# Patient Record
Sex: Male | Born: 1950
Health system: Southern US, Community
[De-identification: ages and names within clinical notes are randomized; demographics above are authoritative.]

## PROBLEM LIST (undated history)

## (undated) DIAGNOSIS — E871 Hypo-osmolality and hyponatremia: Secondary | ICD-10-CM

## (undated) DIAGNOSIS — I639 Cerebral infarction, unspecified: Secondary | ICD-10-CM

## (undated) DIAGNOSIS — K219 Gastro-esophageal reflux disease without esophagitis: Secondary | ICD-10-CM

## (undated) DIAGNOSIS — M1990 Unspecified osteoarthritis, unspecified site: Secondary | ICD-10-CM

## (undated) DIAGNOSIS — H269 Unspecified cataract: Secondary | ICD-10-CM

## (undated) DIAGNOSIS — I5042 Chronic combined systolic (congestive) and diastolic (congestive) heart failure: Secondary | ICD-10-CM

## (undated) DIAGNOSIS — G629 Polyneuropathy, unspecified: Secondary | ICD-10-CM

## (undated) DIAGNOSIS — J9610 Chronic respiratory failure, unspecified whether with hypoxia or hypercapnia: Secondary | ICD-10-CM

## (undated) DIAGNOSIS — I739 Peripheral vascular disease, unspecified: Secondary | ICD-10-CM

## (undated) DIAGNOSIS — I779 Disorder of arteries and arterioles, unspecified: Secondary | ICD-10-CM

## (undated) DIAGNOSIS — I10 Essential (primary) hypertension: Secondary | ICD-10-CM

## (undated) DIAGNOSIS — N183 Chronic kidney disease, stage 3 unspecified: Secondary | ICD-10-CM

## (undated) DIAGNOSIS — I219 Acute myocardial infarction, unspecified: Secondary | ICD-10-CM

## (undated) DIAGNOSIS — F419 Anxiety disorder, unspecified: Secondary | ICD-10-CM

## (undated) DIAGNOSIS — G459 Transient cerebral ischemic attack, unspecified: Secondary | ICD-10-CM

## (undated) DIAGNOSIS — G4730 Sleep apnea, unspecified: Secondary | ICD-10-CM

## (undated) DIAGNOSIS — Z87891 Personal history of nicotine dependence: Secondary | ICD-10-CM

## (undated) DIAGNOSIS — I2510 Atherosclerotic heart disease of native coronary artery without angina pectoris: Secondary | ICD-10-CM

## (undated) DIAGNOSIS — E119 Type 2 diabetes mellitus without complications: Secondary | ICD-10-CM

## (undated) DIAGNOSIS — J449 Chronic obstructive pulmonary disease, unspecified: Secondary | ICD-10-CM

## (undated) DIAGNOSIS — D649 Anemia, unspecified: Secondary | ICD-10-CM

## (undated) DIAGNOSIS — E785 Hyperlipidemia, unspecified: Secondary | ICD-10-CM

## (undated) DIAGNOSIS — J189 Pneumonia, unspecified organism: Secondary | ICD-10-CM

## (undated) DIAGNOSIS — I480 Paroxysmal atrial fibrillation: Secondary | ICD-10-CM

## (undated) HISTORY — PX: TONSILLECTOMY: SUR1361

## (undated) HISTORY — DX: Hypo-osmolality and hyponatremia: E87.1

## (undated) HISTORY — DX: Disorder of arteries and arterioles, unspecified: I77.9

## (undated) HISTORY — DX: Anemia, unspecified: D64.9

## (undated) HISTORY — DX: Anxiety disorder, unspecified: F41.9

## (undated) HISTORY — DX: Chronic respiratory failure, unspecified whether with hypoxia or hypercapnia: J96.10

## (undated) HISTORY — DX: Chronic combined systolic (congestive) and diastolic (congestive) heart failure: I50.42

## (undated) HISTORY — PX: EYE SURGERY: SHX253

## (undated) HISTORY — DX: Cerebral infarction, unspecified: I63.9

## (undated) HISTORY — DX: Paroxysmal atrial fibrillation: I48.0

## (undated) HISTORY — PX: BACK SURGERY: SHX140

## (undated) HISTORY — DX: Unspecified cataract: H26.9

## (undated) HISTORY — DX: Chronic kidney disease, stage 3 unspecified: N18.30

## (undated) HISTORY — DX: Transient cerebral ischemic attack, unspecified: G45.9

## (undated) HISTORY — DX: Peripheral vascular disease, unspecified: I73.9

## (undated) HISTORY — DX: Chronic kidney disease, stage 3 (moderate): N18.3

---

## 2009-09-10 ENCOUNTER — Encounter (INDEPENDENT_AMBULATORY_CARE_PROVIDER_SITE_OTHER): Payer: Self-pay | Admitting: *Deleted

## 2010-04-23 ENCOUNTER — Telehealth: Payer: Self-pay | Admitting: Gastroenterology

## 2010-09-29 NOTE — Letter (Signed)
Summary: Colonoscopy Letter  [facility]  [address] [place], [place] [address]   Phone: [phone]  Fax: [phone]      September 10, 2009 MRN: [id number]   MELISSA PULIDO [address] [place], [place]  [address]   Dear Mr. ORGERON,   According to your medical record, it is time for you to schedule a Colonoscopy. The American Cancer Society recommends this procedure as a method to detect early colon cancer. Patients with a family history of colon cancer, or a personal history of colon polyps or inflammatory bowel disease are at increased risk.  This letter has beeen generated based on the recommendations made at the time of your procedure. If you feel that in your particular situation this may no longer apply, please contact our office.  Please call our office at [phone] to schedule this appointment or to update your records at your earliest convenience.  Thank you for cooperating with [place] to provide you with the very best care possible.   Sincerely,  Judie Petit T. Russella Dar, M.D.  [facility] [phone]

## 2010-09-29 NOTE — Progress Notes (Signed)
Summary: Schedule Colonoscopy  Phone Note Outgoing Call Call back at [facility] Phone [phone]   Call placed by: Harlow Mares CMA Duncan Dull),  April 23, 2010 2:06 PM Call placed to: Patient Summary of Call: Left message on patients machine to call back. patient is due for a colonoscopy Initial call taken by: Harlow Mares CMA Duncan Dull),  April 23, 2010 2:07 PM  Follow-up for Phone Call        Left a message on the patient machine to call back and schedule a previsit and procedure with our office. A letter will be mailed to the patient.   Follow-up by: Harlow Mares CMA Duncan Dull),  April 30, 2010 2:41 PM

## 2011-11-29 ENCOUNTER — Encounter: Payer: Self-pay | Admitting: Gastroenterology

## 2012-06-29 ENCOUNTER — Other Ambulatory Visit: Payer: Self-pay

## 2012-06-29 DIAGNOSIS — R0602 Shortness of breath: Secondary | ICD-10-CM

## 2012-07-07 ENCOUNTER — Ambulatory Visit (HOSPITAL_COMMUNITY)
Admission: RE | Admit: 2012-07-07 | Discharge: 2012-07-07 | Disposition: A | Discharge status: Home / Self Care | Payer: Non-veteran care | Source: Ambulatory Visit | Attending: Pulmonary Disease | Admitting: Pulmonary Disease

## 2012-07-07 DIAGNOSIS — J4489 Other specified chronic obstructive pulmonary disease: Secondary | ICD-10-CM | POA: Insufficient documentation

## 2012-07-07 DIAGNOSIS — J449 Chronic obstructive pulmonary disease, unspecified: Secondary | ICD-10-CM | POA: Insufficient documentation

## 2012-07-07 MED ORDER — ALBUTEROL SULFATE (5 MG/ML) 0.5% IN NEBU
2.5000 mg | INHALATION_SOLUTION | Freq: Once | RESPIRATORY_TRACT | Status: AC
  Administered 2012-07-07: 2.5 mg via RESPIRATORY_TRACT

## 2012-07-10 NOTE — Procedures (Signed)
NAMEEWAN, GRAU                 ACCOUNT NO.:  [id number]  MEDICAL RECORD NO.:  [id number]  LOCATION:  RESP                          FACILITY:  [facility]  PHYSICIAN:  Abdulrahman Bracey L. Juanetta Gosling, M.D.DATE OF BIRTH:  16-Dec-1950  DATE OF PROCEDURE: DATE OF DISCHARGE:  07/07/2012                           PULMONARY FUNCTION TEST   Reason for pulmonary function testing is COPD. 1. Spirometry shows a moderate ventilatory defect with evidence of     airflow obstruction. 2. Lung volumes show air trapping. 3. DLCO is moderately reduced. 4. Airway resistance is elevated confirming the presence of airflow     obstruction. 5. There is improvement with inhaled bronchodilator, but it does not     reach the level of significance.     Tannisha Kennington L. Juanetta Gosling, M.D.     EL[facility]/MEDQ  D:  07/10/2012  T:  07/10/2012  Job:  [id number]

## 2014-08-27 ENCOUNTER — Inpatient Hospital Stay (HOSPITAL_COMMUNITY)
Admission: AD | Admit: 2014-08-27 | Discharge: 2014-08-29 | DRG: 281 | Disposition: A | Discharge status: Home / Self Care | Payer: Medicare Other | Source: Other Acute Inpatient Hospital | Attending: Cardiology | Admitting: Cardiology

## 2014-08-27 DIAGNOSIS — R079 Chest pain, unspecified: Secondary | ICD-10-CM | POA: Diagnosis present

## 2014-08-27 DIAGNOSIS — E780 Pure hypercholesterolemia: Secondary | ICD-10-CM | POA: Diagnosis present

## 2014-08-27 DIAGNOSIS — J441 Chronic obstructive pulmonary disease with (acute) exacerbation: Secondary | ICD-10-CM | POA: Diagnosis present

## 2014-08-27 DIAGNOSIS — E1140 Type 2 diabetes mellitus with diabetic neuropathy, unspecified: Secondary | ICD-10-CM | POA: Diagnosis present

## 2014-08-27 DIAGNOSIS — E1165 Type 2 diabetes mellitus with hyperglycemia: Secondary | ICD-10-CM | POA: Diagnosis present

## 2014-08-27 DIAGNOSIS — Z6841 Body Mass Index (BMI) 40.0 and over, adult: Secondary | ICD-10-CM

## 2014-08-27 DIAGNOSIS — I214 Non-ST elevation (NSTEMI) myocardial infarction: Secondary | ICD-10-CM | POA: Diagnosis present

## 2014-08-27 DIAGNOSIS — G629 Polyneuropathy, unspecified: Secondary | ICD-10-CM | POA: Diagnosis present

## 2014-08-27 DIAGNOSIS — I2582 Chronic total occlusion of coronary artery: Secondary | ICD-10-CM | POA: Diagnosis present

## 2014-08-27 DIAGNOSIS — I249 Acute ischemic heart disease, unspecified: Secondary | ICD-10-CM | POA: Diagnosis present

## 2014-08-27 DIAGNOSIS — G4733 Obstructive sleep apnea (adult) (pediatric): Secondary | ICD-10-CM | POA: Diagnosis present

## 2014-08-27 DIAGNOSIS — I25118 Atherosclerotic heart disease of native coronary artery with other forms of angina pectoris: Secondary | ICD-10-CM | POA: Diagnosis present

## 2014-08-27 DIAGNOSIS — I10 Essential (primary) hypertension: Secondary | ICD-10-CM | POA: Diagnosis present

## 2014-08-27 DIAGNOSIS — I252 Old myocardial infarction: Secondary | ICD-10-CM | POA: Diagnosis not present

## 2014-08-27 HISTORY — DX: Chronic obstructive pulmonary disease, unspecified: J44.9

## 2014-08-27 HISTORY — DX: Sleep apnea, unspecified: G47.30

## 2014-08-27 HISTORY — DX: Essential (primary) hypertension: I10

## 2014-08-27 HISTORY — DX: Polyneuropathy, unspecified: G62.9

## 2014-08-27 LAB — GLUCOSE, CAPILLARY: Glucose-Capillary: 339 mg/dL — ABNORMAL HIGH (ref 70–99)

## 2014-08-27 LAB — COMPREHENSIVE METABOLIC PANEL
ALT: 35 U/L (ref 0–53)
AST: 42 U/L — ABNORMAL HIGH (ref 0–37)
Albumin: 3.5 g/dL (ref 3.5–5.2)
Alkaline Phosphatase: 44 U/L (ref 39–117)
Anion gap: 14 (ref 5–15)
BUN: 17 mg/dL (ref 6–23)
CO2: 28 mmol/L (ref 19–32)
CREATININE: 0.93 mg/dL (ref 0.50–1.35)
Calcium: 9.2 mg/dL (ref 8.4–10.5)
Chloride: 92 mEq/L — ABNORMAL LOW (ref 96–112)
GFR, EST NON AFRICAN AMERICAN: 88 mL/min — AB (ref >90)
Glucose, Bld: 405 mg/dL — ABNORMAL HIGH (ref 70–99)
Potassium: 4.8 mmol/L (ref 3.5–5.1)
Sodium: 134 mmol/L — ABNORMAL LOW (ref 135–145)
Total Bilirubin: 0.5 mg/dL (ref 0.3–1.2)
Total Protein: 6.8 g/dL (ref 6.0–8.3)

## 2014-08-27 LAB — MRSA PCR SCREENING: MRSA by PCR: NEGATIVE

## 2014-08-27 LAB — MAGNESIUM: MAGNESIUM: 2 mg/dL (ref 1.5–2.5)

## 2014-08-27 LAB — TSH: TSH: 0.707 u[IU]/mL (ref 0.350–4.500)

## 2014-08-27 LAB — TROPONIN I: Troponin I: 3.91 ng/mL (ref <0.031)

## 2014-08-27 MED ORDER — LISINOPRIL 20 MG PO TABS
20.0000 mg | ORAL_TABLET | Freq: Every day | ORAL | Status: DC
  Administered 2014-08-27 – 2014-08-29 (×2): 20 mg via ORAL
  Filled 2014-08-27 (×3): qty 1

## 2014-08-27 MED ORDER — INSULIN GLARGINE 100 UNIT/ML ~~LOC~~ SOLN
30.0000 [IU] | Freq: Every day | SUBCUTANEOUS | Status: DC
  Administered 2014-08-27: 30 [IU] via SUBCUTANEOUS
  Filled 2014-08-27 (×2): qty 0.3

## 2014-08-27 MED ORDER — ASPIRIN 81 MG PO CHEW
324.0000 mg | CHEWABLE_TABLET | ORAL | Status: AC
  Administered 2014-08-27: 324 mg via ORAL
  Filled 2014-08-27: qty 4

## 2014-08-27 MED ORDER — HEPARIN BOLUS VIA INFUSION
4000.0000 [IU] | Freq: Once | INTRAVENOUS | Status: AC
  Administered 2014-08-27: 4000 [IU] via INTRAVENOUS
  Filled 2014-08-27: qty 4000

## 2014-08-27 MED ORDER — ASPIRIN 300 MG RE SUPP
300.0000 mg | RECTAL | Status: AC
  Filled 2014-08-27: qty 1

## 2014-08-27 MED ORDER — PNEUMOCOCCAL VAC POLYVALENT 25 MCG/0.5ML IJ INJ
0.5000 mL | INJECTION | INTRAMUSCULAR | Status: DC
  Filled 2014-08-27: qty 0.5

## 2014-08-27 MED ORDER — ATORVASTATIN CALCIUM 80 MG PO TABS
80.0000 mg | ORAL_TABLET | Freq: Every day | ORAL | Status: DC
  Filled 2014-08-27 (×2): qty 1

## 2014-08-27 MED ORDER — METOPROLOL TARTRATE 25 MG PO TABS
25.0000 mg | ORAL_TABLET | Freq: Two times a day (BID) | ORAL | Status: DC
  Administered 2014-08-27 – 2014-08-29 (×4): 25 mg via ORAL
  Filled 2014-08-27 (×8): qty 1

## 2014-08-27 MED ORDER — ASPIRIN EC 81 MG PO TBEC
81.0000 mg | DELAYED_RELEASE_TABLET | Freq: Every day | ORAL | Status: DC
  Administered 2014-08-29: 11:00:00 81 mg via ORAL
  Filled 2014-08-27 (×2): qty 1

## 2014-08-27 MED ORDER — NITROGLYCERIN 0.4 MG SL SUBL: 0.4000 mg | SUBLINGUAL_TABLET | SUBLINGUAL | Status: DC | PRN

## 2014-08-27 MED ORDER — ONDANSETRON HCL 4 MG/2ML IJ SOLN
4.0000 mg | Freq: Four times a day (QID) | INTRAMUSCULAR | Status: DC | PRN
  Filled 2014-08-27: qty 2

## 2014-08-27 MED ORDER — ACETAMINOPHEN 325 MG PO TABS: 650.0000 mg | ORAL_TABLET | ORAL | Status: DC | PRN

## 2014-08-27 MED ORDER — PANTOPRAZOLE SODIUM 40 MG PO TBEC
40.0000 mg | DELAYED_RELEASE_TABLET | Freq: Every day | ORAL | Status: DC
  Administered 2014-08-28 – 2014-08-29 (×2): 40 mg via ORAL
  Filled 2014-08-27 (×2): qty 1

## 2014-08-27 MED ORDER — HEPARIN (PORCINE) IN NACL 100-0.45 UNIT/ML-% IJ SOLN
2000.0000 [IU]/h | INTRAMUSCULAR | Status: DC
  Administered 2014-08-27: 1650 [IU]/h via INTRAVENOUS
  Filled 2014-08-27 (×3): qty 250

## 2014-08-27 MED ORDER — SODIUM CHLORIDE 0.9 % IV SOLN
INTRAVENOUS | Status: DC
  Administered 2014-08-27: 22:00:00 via INTRAVENOUS

## 2014-08-27 MED ORDER — NITROGLYCERIN IN D5W 200-5 MCG/ML-% IV SOLN
0.0000 ug/min | INTRAVENOUS | Status: DC
  Administered 2014-08-27: 10 ug/min via INTRAVENOUS
  Administered 2014-08-28: 15:00:00 40 ug/min via INTRAVENOUS
  Filled 2014-08-27: qty 250

## 2014-08-27 MED ORDER — INSULIN ASPART 100 UNIT/ML ~~LOC~~ SOLN
0.0000 [IU] | Freq: Three times a day (TID) | SUBCUTANEOUS | Status: DC
  Administered 2014-08-29: 09:00:00 5 [IU] via SUBCUTANEOUS

## 2014-08-27 MED ORDER — LEVALBUTEROL HCL 1.25 MG/0.5ML IN NEBU
1.2500 mg | INHALATION_SOLUTION | Freq: Three times a day (TID) | RESPIRATORY_TRACT | Status: DC
  Administered 2014-08-27 – 2014-08-28 (×3): 1.25 mg via RESPIRATORY_TRACT
  Filled 2014-08-27 (×10): qty 0.5

## 2014-08-27 MED ORDER — CLOPIDOGREL BISULFATE 75 MG PO TABS
75.0000 mg | ORAL_TABLET | Freq: Every day | ORAL | Status: DC
  Administered 2014-08-28: 75 mg via ORAL
  Filled 2014-08-27 (×2): qty 1

## 2014-08-27 NOTE — H&P (Signed)
Jose [name] is an 63 y.o. male.   Chief Complaint: Chest pain/positive troponin I HPI: Patient is 63 year old male with past medical history significant for hypertension, diabetes mellitus, hypercholesteremia, obstructive sleep apnea, morbid obesity, COPD, peripheral neuropathy, was transferred from [facility] for further management. Patient was admitted at [facility] because of exacerbation of COPD with progressive shortness of breath and subsequently while in the hospital developed retrosternal chest pain radiating to the left arm relieved with sublingual nitroglycerin and morphine pain was burning in nature grade 8/10 her patient was noted to have minimally elevated troponin I a subsequent repeat troponin I was significantly elevated to 4.40 EKG done showed normal sinus rhythm with nonspecific ST-T wave changes. Patient was transferred to [facility] for further treatment. Patient presently denies any chest pain nausea vomiting diaphoresis denies any palpitation lightheadedness or syncope denies such chest pain in the past denies PND orthopnea leg swelling.   No past medical history on file.  No past surgical history on file.  No family history on file. Social History:  has no tobacco, alcohol, and drug history on file.  Allergies: Allergies not on file  No prescriptions prior to admission    No results found for this or any previous visit (from the past 48 hour(s)). No results found.  Review of Systems  Constitutional: Negative for fever and chills.  Eyes: Positive for double vision. Negative for blurred vision and photophobia.  Respiratory: Positive for cough and shortness of breath. Negative for sputum production.   Cardiovascular: Positive for chest pain. Negative for palpitations, orthopnea, claudication, leg swelling and PND.  Gastrointestinal: Negative for nausea, vomiting and abdominal pain.  Genitourinary: Negative for dysuria.   Neurological: Negative for dizziness and headaches.    Blood pressure 217/83, pulse 109, SpO2 95 %. Physical Exam  Constitutional: He is oriented to person, place, and time.  HENT:  Head: Normocephalic and atraumatic.  Eyes: Conjunctivae are normal. Pupils are equal, round, and reactive to light. Left eye exhibits no discharge.  Neck: Normal range of motion. Neck supple. No JVD present. No tracheal deviation present. No thyromegaly present.  Cardiovascular: Normal rate and regular rhythm.  Exam reveals gallop (S4 gallop noted).   No murmur heard. Respiratory:  Decreased breath sound at bases with occasional expiratory wheezing  GI: Soft. Bowel sounds are normal. He exhibits distension. There is no tenderness. There is no rebound.  Musculoskeletal: He exhibits no edema or tenderness.  Neurological: He is alert and oriented to person, place, and time.     Assessment/Plan Acute non-Q-wave myocardial infarction Resolving exacerbation of COPD Diabetes mellitus Hypertension Hypercholesteremia Obstructive sleep apnea on CPAP Morbid obesity Diabetic neuropathy Plan As per orders Discussed with patient and family regarding left cardiac cath possible PTCA stenting its risk and benefits i.e. death MI stroke need for emergency CABG local vascular complications etc. and consented for PCI [facility] N 08/27/2014, 8:42 PM

## 2014-08-27 NOTE — Progress Notes (Signed)
[facility] Progress Note Patient Name: Jose [name] DOB: 03-09-1951 MRN: [id number]   Date of Service  08/27/2014  HPI/Events of Note  63 yo admitted with chest pain and elevated BP.  BP improved from admission, and oxygenation stable.  eICU Interventions  No additional elink interventions at this time.        Donte Kary 08/27/2014, 9:44 PM

## 2014-08-27 NOTE — Progress Notes (Signed)
ANTICOAGULATION CONSULT NOTE - Initial Consult  Pharmacy Consult for heparin Indication: chest pain/ACS  Allergies not on file  Patient Measurements: Ht 72in Wt 135kg Heparin Dosing Weight: 108kg  Vital Signs: BP: 217/83 mmHg (12/29 2030) Pulse Rate: 109 (12/29 2030)  Labs: No results for input(s): HGB, HCT, PLT, APTT, LABPROT, INR, HEPARINUNFRC, CREATININE, CKTOTAL, CKMB, TROPONINI in the last 72 hours.  CrCl cannot be calculated (Unknown ideal weight.).   Medical History: No past medical history on file.  Medications:  Scheduled:  . aspirin  324 mg Oral NOW   Or  . aspirin  300 mg Rectal NOW  . [START ON 08/28/2014] aspirin EC  81 mg Oral Daily  . [START ON 08/28/2014] atorvastatin  80 mg Oral q1800  . [START ON 08/28/2014] clopidogrel  75 mg Oral Q breakfast  . [START ON 08/28/2014] insulin aspart  0-9 Units Subcutaneous TID WC  . insulin glargine  30 Units Subcutaneous QHS  . levalbuterol  1.25 mg Nebulization TID  . lisinopril  20 mg Oral Daily  . metoprolol tartrate  25 mg Oral BID  . [START ON 08/28/2014] pantoprazole  40 mg Oral Q0600   Infusions:  . sodium chloride    . nitroGLYCERIN      Assessment: 63 yo who was admitted for CP. IV heparin has been ordered to r/o MI. He was tx from [facility]. It seems that heparin was started there but has been off for at least an hour.   Goal of Therapy:  Heparin level 0.3-0.7 units/ml Monitor platelets by anticoagulation protocol: Yes   Plan:   Heparin bolus 4000 units x1 Heparin drip at 1650 units/hr Check level in AM Daily level and CBC  Onnie Boer, PharmD Pager: [phone] 08/27/2014 9:22 PM

## 2014-08-28 ENCOUNTER — Encounter (HOSPITAL_COMMUNITY): Payer: Self-pay | Admitting: *Deleted

## 2014-08-28 ENCOUNTER — Encounter (HOSPITAL_COMMUNITY)
Admission: AD | Disposition: A | Discharge status: Home / Self Care | Payer: Non-veteran care | Source: Other Acute Inpatient Hospital | Attending: Cardiology

## 2014-08-28 HISTORY — PX: LEFT HEART CATHETERIZATION WITH CORONARY ANGIOGRAM: SHX5451

## 2014-08-28 LAB — PROTIME-INR
INR: 1.03 (ref 0.00–1.49)
Prothrombin Time: 13.6 seconds (ref 11.6–15.2)

## 2014-08-28 LAB — HEPARIN LEVEL (UNFRACTIONATED)

## 2014-08-28 LAB — TROPONIN I
Troponin I: 3.04 ng/mL (ref <0.031)
Troponin I: <0.03 ng/mL (ref <0.031)

## 2014-08-28 LAB — BASIC METABOLIC PANEL
Anion gap: 12 (ref 5–15)
BUN: 16 mg/dL (ref 6–23)
CHLORIDE: 95 meq/L — AB (ref 96–112)
CO2: 27 mmol/L (ref 19–32)
CREATININE: 0.75 mg/dL (ref 0.50–1.35)
Calcium: 8.6 mg/dL (ref 8.4–10.5)
GFR calc Af Amer: >90 mL/min (ref >90)
GFR calc non Af Amer: >90 mL/min (ref >90)
GLUCOSE: 267 mg/dL — AB (ref 70–99)
Potassium: 4 mmol/L (ref 3.5–5.1)
Sodium: 134 mmol/L — ABNORMAL LOW (ref 135–145)

## 2014-08-28 LAB — GLUCOSE, CAPILLARY
GLUCOSE-CAPILLARY: 251 mg/dL — AB (ref 70–99)
GLUCOSE-CAPILLARY: 247 mg/dL — AB (ref 70–99)
GLUCOSE-CAPILLARY: 330 mg/dL — AB (ref 70–99)
Glucose-Capillary: 267 mg/dL — ABNORMAL HIGH (ref 70–99)
Glucose-Capillary: 268 mg/dL — ABNORMAL HIGH (ref 70–99)

## 2014-08-28 LAB — LIPID PANEL
Cholesterol: 296 mg/dL — ABNORMAL HIGH (ref 0–200)
HDL: 32 mg/dL — ABNORMAL LOW (ref >39)
LDL CALC: UNDETERMINED mg/dL (ref 0–99)
TRIGLYCERIDES: 981 mg/dL — AB (ref <150)
Total CHOL/HDL Ratio: 9.3 RATIO
VLDL: UNDETERMINED mg/dL (ref 0–40)

## 2014-08-28 LAB — CBC
HEMATOCRIT: 41.1 % (ref 39.0–52.0)
Hemoglobin: 13.6 g/dL (ref 13.0–17.0)
MCH: 30.3 pg (ref 26.0–34.0)
MCHC: 33.1 g/dL (ref 30.0–36.0)
MCV: 91.5 fL (ref 78.0–100.0)
Platelets: 179 10*3/uL (ref 150–400)
RBC: 4.49 MIL/uL (ref 4.22–5.81)
RDW: 13.8 % (ref 11.5–15.5)
WBC: 8.8 10*3/uL (ref 4.0–10.5)

## 2014-08-28 LAB — POCT ACTIVATED CLOTTING TIME: Activated Clotting Time: 276 seconds

## 2014-08-28 LAB — HEMOGLOBIN A1C
Hgb A1c MFr Bld: 10 % — ABNORMAL HIGH (ref <5.7)
Mean Plasma Glucose: 240 mg/dL — ABNORMAL HIGH (ref <117)

## 2014-08-28 SURGERY — LEFT HEART CATHETERIZATION WITH CORONARY ANGIOGRAM
Anesthesia: LOCAL

## 2014-08-28 MED ORDER — OXYCODONE-ACETAMINOPHEN 5-325 MG PO TABS
1.0000 | ORAL_TABLET | ORAL | Status: DC | PRN
  Administered 2014-08-28: 2 via ORAL

## 2014-08-28 MED ORDER — SODIUM CHLORIDE 0.9 % IV SOLN: 250.0000 mL | INTRAVENOUS | Status: DC | PRN

## 2014-08-28 MED ORDER — LIDOCAINE HCL (PF) 1 % IJ SOLN
INTRAMUSCULAR | Status: AC
  Filled 2014-08-28: qty 30

## 2014-08-28 MED ORDER — AMLODIPINE BESYLATE 5 MG PO TABS
5.0000 mg | ORAL_TABLET | Freq: Every day | ORAL | Status: DC
  Administered 2014-08-28 – 2014-08-29 (×2): 5 mg via ORAL
  Filled 2014-08-28 (×3): qty 1

## 2014-08-28 MED ORDER — ALUM & MAG HYDROXIDE-SIMETH 200-200-20 MG/5ML PO SUSP
30.0000 mL | Freq: Four times a day (QID) | ORAL | Status: DC | PRN
  Administered 2014-08-29: 30 mL via ORAL
  Filled 2014-08-28 (×2): qty 30

## 2014-08-28 MED ORDER — ONDANSETRON HCL 4 MG/2ML IJ SOLN: 4.0000 mg | Freq: Four times a day (QID) | INTRAMUSCULAR | Status: DC | PRN

## 2014-08-28 MED ORDER — PRASUGREL HCL 10 MG PO TABS
ORAL_TABLET | ORAL | Status: AC
  Filled 2014-08-28: qty 1

## 2014-08-28 MED ORDER — MIDAZOLAM HCL 2 MG/2ML IJ SOLN
INTRAMUSCULAR | Status: AC
  Filled 2014-08-28: qty 2

## 2014-08-28 MED ORDER — ASPIRIN 81 MG PO CHEW: 81.0000 mg | CHEWABLE_TABLET | Freq: Every day | ORAL | Status: DC

## 2014-08-28 MED ORDER — NITROGLYCERIN 1 MG/10 ML FOR IR/CATH LAB
INTRA_ARTERIAL | Status: AC
  Filled 2014-08-28: qty 10

## 2014-08-28 MED ORDER — SODIUM CHLORIDE 0.9 % IJ SOLN: 3.0000 mL | INTRAMUSCULAR | Status: DC | PRN

## 2014-08-28 MED ORDER — HEPARIN (PORCINE) IN NACL 2-0.9 UNIT/ML-% IJ SOLN
INTRAMUSCULAR | Status: AC
  Filled 2014-08-28: qty 1000

## 2014-08-28 MED ORDER — DIAZEPAM 5 MG PO TABS
5.0000 mg | ORAL_TABLET | Freq: Every evening | ORAL | Status: DC | PRN
  Administered 2014-08-28: 21:00:00 5 mg via ORAL
  Filled 2014-08-28: qty 1

## 2014-08-28 MED ORDER — HEPARIN BOLUS VIA INFUSION
3000.0000 [IU] | Freq: Once | INTRAVENOUS | Status: AC
  Administered 2014-08-28: 3000 [IU] via INTRAVENOUS
  Filled 2014-08-28: qty 3000

## 2014-08-28 MED ORDER — TIOTROPIUM BROMIDE MONOHYDRATE 18 MCG IN CAPS
18.0000 ug | ORAL_CAPSULE | Freq: Every day | RESPIRATORY_TRACT | Status: DC
  Administered 2014-08-28: 18 ug via RESPIRATORY_TRACT
  Filled 2014-08-28: qty 5

## 2014-08-28 MED ORDER — SODIUM CHLORIDE 0.9 % IV SOLN
1.0000 mL/kg/h | INTRAVENOUS | Status: DC
  Administered 2014-08-28: 1 mL/kg/h via INTRAVENOUS

## 2014-08-28 MED ORDER — ASPIRIN 81 MG PO CHEW
81.0000 mg | CHEWABLE_TABLET | ORAL | Status: AC
  Administered 2014-08-28: 81 mg via ORAL
  Filled 2014-08-28 (×2): qty 1

## 2014-08-28 MED ORDER — TIROFIBAN HCL IV 5 MG/100ML
0.0750 ug/kg/min | INTRAVENOUS | Status: DC
  Administered 2014-08-28 (×2): 0.075 ug/kg/min via INTRAVENOUS
  Filled 2014-08-28 (×5): qty 100

## 2014-08-28 MED ORDER — CLOPIDOGREL BISULFATE 75 MG PO TABS
75.0000 mg | ORAL_TABLET | Freq: Every day | ORAL | Status: DC
  Administered 2014-08-29: 11:00:00 75 mg via ORAL
  Filled 2014-08-28: qty 1

## 2014-08-28 MED ORDER — CETYLPYRIDINIUM CHLORIDE 0.05 % MT LIQD
7.0000 mL | Freq: Two times a day (BID) | OROMUCOSAL | Status: DC
  Administered 2014-08-29: 7 mL via OROMUCOSAL

## 2014-08-28 MED ORDER — LABETALOL HCL 5 MG/ML IV SOLN
10.0000 mg | Freq: Once | INTRAVENOUS | Status: DC
Start: 2014-08-28 — End: 2014-08-29
  Filled 2014-08-28 (×2): qty 4

## 2014-08-28 MED ORDER — INSULIN GLARGINE 100 UNIT/ML ~~LOC~~ SOLN
40.0000 [IU] | Freq: Every day | SUBCUTANEOUS | Status: DC
Start: 2014-08-28 — End: 2014-08-29
  Administered 2014-08-28: 22:00:00 40 [IU] via SUBCUTANEOUS
  Filled 2014-08-28 (×2): qty 0.4

## 2014-08-28 MED ORDER — SODIUM CHLORIDE 0.9 % IV SOLN: 1.0000 mL/kg/h | INTRAVENOUS | Status: DC

## 2014-08-28 MED ORDER — OXYCODONE-ACETAMINOPHEN 5-325 MG PO TABS
ORAL_TABLET | ORAL | Status: AC
  Filled 2014-08-28: qty 2

## 2014-08-28 MED ORDER — BIVALIRUDIN 250 MG IV SOLR
INTRAVENOUS | Status: AC
  Filled 2014-08-28: qty 250

## 2014-08-28 MED ORDER — TERAZOSIN HCL 2 MG PO CAPS
4.0000 mg | ORAL_CAPSULE | Freq: Every evening | ORAL | Status: DC
  Filled 2014-08-28 (×2): qty 2

## 2014-08-28 MED ORDER — SODIUM CHLORIDE 0.9 % IV SOLN: INTRAVENOUS | Status: AC

## 2014-08-28 MED ORDER — FENTANYL CITRATE 0.05 MG/ML IJ SOLN
INTRAMUSCULAR | Status: AC
  Filled 2014-08-28: qty 2

## 2014-08-28 MED ORDER — MORPHINE SULFATE 2 MG/ML IJ SOLN
2.0000 mg | INTRAMUSCULAR | Status: DC | PRN
  Administered 2014-08-28: 2 mg via INTRAVENOUS
  Filled 2014-08-28 (×2): qty 1

## 2014-08-28 MED ORDER — SODIUM CHLORIDE 0.9 % IV SOLN
0.2500 mg/kg/h | INTRAVENOUS | Status: DC
  Filled 2014-08-28: qty 250

## 2014-08-28 MED ORDER — SODIUM CHLORIDE 0.9 % IJ SOLN: 3.0000 mL | Freq: Two times a day (BID) | INTRAMUSCULAR | Status: DC

## 2014-08-28 MED ORDER — ACETAMINOPHEN 325 MG PO TABS: 650.0000 mg | ORAL_TABLET | ORAL | Status: DC | PRN

## 2014-08-28 NOTE — CV Procedure (Signed)
Left cardiac as/attempted PCI to CTO of RCA dictated on 08/28/2014 dictation number is [id number]

## 2014-08-28 NOTE — Progress Notes (Signed)
ANTICOAGULATION CONSULT NOTE - Initial Consult  Pharmacy Consult for heparin Indication: chest pain/ACS  No Known Allergies  Patient Measurements: Ht 72in Wt 135kg Heparin Dosing Weight: 108kg  Vital Signs: Temp: 98.2 F (36.8 C) (12/29 2327) Temp Source: Oral (12/29 2045) BP: 158/44 mmHg (12/30 0300) Pulse Rate: 82 (12/30 0300)  Labs:  Recent Labs  08/27/14 2202 08/28/14 0311  HEPARINUNFRC  --  <0.10*  CREATININE 0.93  --   TROPONINI 3.91* 3.04*    Estimated Creatinine Clearance: 115.6 mL/min (by C-G formula based on Cr of 0.93).  Assessment: 63 y.o. male with chest pain for heparin    Goal of Therapy:  Heparin level 0.3-0.7 units/ml Monitor platelets by anticoagulation protocol: Yes   Plan:  Heparin 3000 units IV bolus, then increase heparin 2000 units/hr Check heparin level in 6 hours.  Phillis Knack, PharmD, BCPS  08/28/2014 4:32 AM

## 2014-08-28 NOTE — Care Management Note (Signed)
    Page 1 of 1   08/28/2014     10:07:33 AM CARE MANAGEMENT NOTE 08/28/2014  Patient:  Jose [name], Jose [name]   Account Number:  [id number]  Date Initiated:  08/28/2014  Documentation initiated by:  Elissa Hefty  Subjective/Objective Assessment:   adm w mi     Action/Plan:   lives alone, has vet adm ins, alerted joey ext [phone] at [employer] of adm [phone]   Anticipated DC Date:     Anticipated DC Plan:  HOME/SELF CARE         Choice offered to / List presented to:             Status of service:   Medicare Important Message given?   (If response is "NO", the following Medicare IM given date fields will be blank) Date Medicare IM given:   Medicare IM given by:   Date Additional Medicare IM given:   Additional Medicare IM given by:    Discharge Disposition:    Per UR Regulation:  Reviewed for med. necessity/level of care/duration of stay  If discussed at [place] of Stay Meetings, dates discussed:    Comments:

## 2014-08-28 NOTE — Cardiovascular Report (Signed)
NAMELEE, KUANG NO.:  [id number]  MEDICAL RECORD NO.:  [id number]  LOCATION:  6C07C                        FACILITY:  [facility]  PHYSICIAN:  Jolyn Deshmukh N. Terrence Dupont, M.D. DATE OF BIRTH:  [birth year]  DATE OF PROCEDURE:  08/28/2014 DATE OF DISCHARGE:                           CARDIAC CATHETERIZATION   PROCEDURES PERFORMED: 1. Left cardiac cath with selective left and right coronary     angiography, LV graphy via right groin using Judkins technique. 2. Attempted PTCA to chronically occluded RCA without success.  INDICATION FOR THE PROCEDURE:  Mr. Bethard is a 63 year old male with past medical history significant for hypertension, diabetes mellitus, hypercholesteremia, obstructive sleep apnea, moderate obesity, COPD, peripheral neuropathy was transferred from [facility] for further management.  The patient was admitted at [facility] because of exacerbation of COPD with progressive shortness of breath and subsequently while in the hospital developed retrosternal chest pain radiating to the left arm relieved with sublingual nitro and morphine. Pain was burning in nature, grade 8/10. The patient was initially noted to have minimally elevated troponin I and subsequent repeat troponin I was significantly elevated to 4.40.  EKG done showed normal sinus rhythm and nonspecific ST-T wave changes.  The patient was transferred to [facility] for further treatment. The patient presently denies any chest pain, nausea, vomiting, or diaphoresis. Denies palpitation, lightheadedness, or syncope.  Denies such episodes of chest pain in the past.  Denies PND, orthopnea, or leg swelling.  The patient's EKG done here showed normal sinus rhythm with nonspecific ST-T wave changes.  His repeat labs; troponin I is trending down to 3.91 and 3.04.  Due to typical anginal chest pain, elevated cardiac enzymes, multiple risk factors, discussed with the patient  regarding left cath, possible PTCA stenting, its risks and benefits, i.e., death, MI, stroke, need for emergency CABG, local vascular complications, etc. and consented for PCI.  DESCRIPTION OF PROCEDURE:  After obtaining the informed consent, the patient was brought to the cath lab and was placed on fluoroscopy table. Right groin was prepped and draped in usual fashion.  1% Xylocaine was used for local anesthesia in the right groin.  With the help of thin wall needle, 5-French arterial sheath was placed.  The sheath was aspirated and flushed.  Next, 5-French left Judkins catheter was advanced over the wire under fluoroscopic guidance up to the ascending aorta.  Wire was pulled out.  The catheter was aspirated and connected to the Manifold.  Catheter was further advanced and engaged into left coronary ostium.  Multiple views of the left system were taken.  Next, catheter was disengaged and was pulled out over the wire and was replaced with 5-French right Judkins catheter which was advanced over the wire under fluoroscopic guidance up to the ascending aorta.  Wire was pulled out.  The catheter was aspirated and connected to the Manifold.  Catheter was further advanced and engaged into right coronary ostium.  Multiple views of the right system were taken.  Next, catheter was disengaged and was pulled out over the wire and was replaced with 5- French pigtail catheter, which was advanced over the wire under fluoroscopic guidance up to  the ascending aorta.  Wire was pulled out. The catheter was aspirated and connected to the Manifold.  Catheter was further advanced across the aortic valve into the LV.  LV graft was done in 30-degree RAO position.  Post-angiographic pressures were recorded from LV and then pullback pressures were recorded from the aorta.  There was no gradient across the aortic valve.  Next, the pigtail catheter was pulled out over the wire.  Sheaths were aspirated and  flushed.  FINDINGS:  LV showed inferobasal wall severe hypokinesia,  EF of 50-55%. Left main was long which was patent.  LAD has 40-50% ostial stenosis and 20-30% proximal stenosis.  Diagonal 1 and 2 were small which had mild disease.  Ramus was small which was patent.  Left circumflex has mild disease.  OM1 is moderate sized, which is patent.  OM 2 is small but long vessel, which has mild disease.  RCA was 100% occluded beyond proximal portion filling by bridging collaterals and also collaterals from the left system.  INTERVENTIONAL PROCEDURE:  Attempted to pass the wire into RCA using multiple wires i.e. ChoICE PT, Prowater, Runthrough, Fielder FC, and [employer].  Finally Fielder XT wire could be passed up to the mid portion of the RCA, but no balloon catheter could be advanced beyond the proximal portion, initially tried 2.5 x 12 mm long and then 1.20 x 20 mm long without success.  The patient did not have any episodes of chest pain during the procedure.  The patient tolerated the procedure well. There were no complications.  The patient was transferred to recovery room in stable condition.  Plan is to treat him medically.     Allegra Lai. Terrence Dupont, M.D.     MNH/MEDQ  D:  08/28/2014  T:  08/28/2014  Job:  [id number]

## 2014-08-28 NOTE — Progress Notes (Signed)
Dr. Terrence Dupont paged and made aware of BP. NTG drip titrated to 20 mcg.

## 2014-08-28 NOTE — Progress Notes (Addendum)
Inpatient Diabetes Program Recommendations  AACE/ADA: New Consensus Statement on Inpatient Glycemic Control (2013)  Target Ranges:  Prepandial:   less than 140 mg/dL      Peak postprandial:   less than 180 mg/dL (1-2 hours)      Critically ill patients:  140 - 180 mg/dL   Inpatient Diabetes Program Recommendations Correction (SSI): increase correction to resistant scale  May need to increase Lantus and add Novolog meal coverage once eating.  Will need close follow-up with PCP for DM management after discharge for A1C 10. Thank you  Raoul Pitch BSN, RN,CDE Inpatient Diabetes Coordinator [phone] (team pager)

## 2014-08-28 NOTE — Progress Notes (Signed)
Site area: right groin  Site Prior to Removal:  Level 0  Pressure Applied For 25 MINUTES    Minutes Beginning at 1805  Manual:   Yes.    Patient Status During Pull:  stable  Post Pull Groin Site:  Level 0  Post Pull Instructions Given:  Yes.    Post Pull Pulses Present:  Yes.    Dressing Applied:  Yes.    Comments:  1900 rechecked site with no change, dressing dry and intact, pulses +2, no bleeding bruising or hematoma noted

## 2014-08-28 NOTE — Progress Notes (Signed)
Subjective:  Denies chest pain or shortness of breath. Denies palpitation lightheadedness or syncope. Tolerated procedure well. Troponin I is trending down  Objective:  Vital Signs in the last 24 hours: Temp:  [97.4 F (36.3 C)-98.4 F (36.9 C)] 98.4 F (36.9 C) (12/30 1700) Pulse Rate:  [73-109] 95 (12/30 1700) Resp:  [12-20] 18 (12/30 1700) BP: (149-217)/(44-150) 155/78 mmHg (12/30 1700) SpO2:  [93 %-100 %] 96 % (12/30 1700) Weight:  [133.7 kg (294 lb 12.1 oz)-134.9 kg (297 lb 6.4 oz)] 133.7 kg (294 lb 12.1 oz) (12/30 0449)  Intake/Output from previous day: 12/29 0701 - 12/30 0700 In: 715.4 [I.V.:715.4] Out: 1350 [Urine:1350] Intake/Output from this shift: Total I/O In: 510 [I.V.:510] Out: 300 [Urine:300]  Physical Exam: Neck: no adenopathy, no carotid bruit, no JVD and supple, symmetrical, trachea midline Lungs: Decreased breath sound at bases Heart: regular rate and rhythm, S1, S2 normal and Soft systolic murmur and S4 gallop noted Abdomen: soft, non-tender; bowel sounds normal; no masses,  no organomegaly Extremities: No clubbing cyanosis 1+ edema noted right groin dressing dry  Lab Results:  Recent Labs  08/28/14 0745  WBC 8.8  HGB 13.6  PLT 179    Recent Labs  08/27/14 2202 08/28/14 0745  NA 134* 134*  K 4.8 4.0  CL 92* 95*  CO2 28 27  GLUCOSE 405* 267*  BUN 17 16  CREATININE 0.93 0.75    Recent Labs  08/28/14 0311 08/28/14 1225  TROPONINI 3.04* <0.03   Hepatic Function Panel  Recent Labs  08/27/14 2202  PROT 6.8  ALBUMIN 3.5  AST 42*  ALT 35  ALKPHOS 44  BILITOT 0.5    Recent Labs  08/28/14 0311  CHOL 296*   No results for input(s): PROTIME in the last 72 hours.  Imaging: Imaging results have been reviewed and No results found.  Cardiac Studies:  Assessment/Plan:  Status post small non-Q-wave myocardial infarction History of silent inferior wall MI in the past with occluded RCA status post attempted PCI to CTO of  RCA Status post exacerbation of COPD Hypertension Uncontrolled diabetes mellitus Hypercholesteremia Obstructive sleep apnea on Cpap Morbid obesity Diabetic neuropathy Plan As per orders Increase Lantus insulin as per orders. Discussed with patient at length regarding lifestyle changes diet exercise blood pressure and blood sugar monitoring closely and weight reduction.   LOS: 1 day    Devan Babino N 08/28/2014, 6:36 PM

## 2014-08-28 NOTE — Interval H&P Note (Signed)
  Cath Lab Visit (complete for each Cath Lab visit)  Clinical Evaluation Leading to the Procedure:   ACS: Yes.    Non-ACS:    Anginal Classification: CCS IV  Anti-ischemic medical therapy: Maximal Therapy (2 or more classes of medications)  Non-Invasive Test Results: No non-invasive testing performed  Prior CABG: No previous CABG      History and Physical Interval Note:  08/28/2014 10:17 AM  Jose [name]  has presented today for surgery, with the diagnosis of NSTEMI  The various methods of treatment have been discussed with the patient and family. After consideration of risks, benefits and other options for treatment, the patient has consented to  Procedure(s): LEFT HEART CATHETERIZATION WITH CORONARY ANGIOGRAM (N/A) as a surgical intervention .  The patient's history has been reviewed, patient examined, no change in status, stable for surgery.  I have reviewed the patient's chart and labs.  Questions were answered to the patient's satisfaction.     Clent Demark

## 2014-08-29 LAB — BASIC METABOLIC PANEL
Anion gap: 9 (ref 5–15)
BUN: 13 mg/dL (ref 6–23)
CALCIUM: 8.8 mg/dL (ref 8.4–10.5)
CHLORIDE: 97 meq/L (ref 96–112)
CO2: 26 mmol/L (ref 19–32)
Creatinine, Ser: 0.67 mg/dL (ref 0.50–1.35)
GFR calc Af Amer: >90 mL/min (ref >90)
Glucose, Bld: 243 mg/dL — ABNORMAL HIGH (ref 70–99)
Potassium: 4.9 mmol/L (ref 3.5–5.1)
Sodium: 132 mmol/L — ABNORMAL LOW (ref 135–145)

## 2014-08-29 LAB — CBC
HCT: 42.7 % (ref 39.0–52.0)
HEMOGLOBIN: 13.8 g/dL (ref 13.0–17.0)
MCH: 29.3 pg (ref 26.0–34.0)
MCHC: 32.3 g/dL (ref 30.0–36.0)
MCV: 90.7 fL (ref 78.0–100.0)
Platelets: 161 10*3/uL (ref 150–400)
RBC: 4.71 MIL/uL (ref 4.22–5.81)
RDW: 13.7 % (ref 11.5–15.5)
WBC: 8.4 10*3/uL (ref 4.0–10.5)

## 2014-08-29 LAB — GLUCOSE, CAPILLARY: Glucose-Capillary: 271 mg/dL — ABNORMAL HIGH (ref 70–99)

## 2014-08-29 MED ORDER — CLOPIDOGREL BISULFATE 75 MG PO TABS: 75.0000 mg | ORAL_TABLET | Freq: Every day | ORAL | Status: DC

## 2014-08-29 MED ORDER — ATORVASTATIN CALCIUM 80 MG PO TABS: 80.0000 mg | ORAL_TABLET | Freq: Every day | ORAL | Status: DC

## 2014-08-29 MED ORDER — METFORMIN HCL 1000 MG PO TABS: 1000.0000 mg | ORAL_TABLET | Freq: Two times a day (BID) | ORAL | Status: AC

## 2014-08-29 MED ORDER — METOPROLOL TARTRATE 25 MG PO TABS: 25.0000 mg | ORAL_TABLET | Freq: Two times a day (BID) | ORAL | Status: DC

## 2014-08-29 MED ORDER — NITROGLYCERIN 0.4 MG SL SUBL: 0.4000 mg | SUBLINGUAL_TABLET | SUBLINGUAL | Status: AC | PRN

## 2014-08-29 MED ORDER — AMLODIPINE BESYLATE 5 MG PO TABS: 5.0000 mg | ORAL_TABLET | Freq: Every day | ORAL | Status: DC

## 2014-08-29 MED FILL — Sodium Chloride IV Soln 0.9%: INTRAVENOUS | Qty: 50 | Status: AC

## 2014-08-29 NOTE — Discharge Summary (Signed)
NAMEEZRIEL, [name] NO.:  [id number]  MEDICAL RECORD NO.:  [id number]  LOCATION:  6C07C                        FACILITY:  [facility]  PHYSICIAN:  Billy Turvey N. Jose [name], M.D. DATE OF BIRTH:  October 18, 1950  DATE OF ADMISSION:  08/27/2014 DATE OF DISCHARGE:  08/29/2014                              DISCHARGE SUMMARY   ADMITTING DIAGNOSES: 1. Acute non-Q-wave myocardial infarction. 2. Resolving exacerbation of chronic obstructive pulmonary disease. 3. Diabetes mellitus. 4. Hypertension. 5. Hypercholesteremia. 6. Obstructive sleep apnea on CPAP. 7. Morbid obesity. 8. Diabetic neuropathy.  DISCHARGE DIAGNOSES: 1. Status post small non-Q-wave myocardial infarction. 2. Status post silent inferior wall myocardial infarction in the past,     status post left cardiac cath and attempted PCI to RCA which felt     to be chronically occluded. 3. Multivessel coronary artery disease. 4. Uncontrolled diabetes mellitus. 5. Hypertension. 6. Hypercholesteremia. 7. Obstructive sleep apnea on CPAP. 8. Morbid obesity. 9. Diabetic neuropathy.  DISCHARGE HOME MEDICATIONS: 1. Amlodipine 5 mg 1 tablet daily. 2. Atorvastatin 80 mg 1 tablet daily. 3. Clopidogrel 75 mg daily. 4. Nitrostat 0.4 mg sublingual p.r.n. 5. Albuterol inhaler 2 puffs every 6 hours as before. 6. Aspirin 81 mg 1 tablet daily. 7. Colace 100 mg 1-2 tablets for constipation as before. 8. Vitamin D2 50,000 units once every week. 9. Ferrous sulfate 325 mg 3 times daily as before. 10.Fish oil 1 capsule as before. 11.Foradil inhaler twice daily as before. 12.Lasix 40 mg twice daily as before. 13.Glipizide 5 mg 4 times daily as before. 14.Insulin 25 units, NovoLog 3 times daily as before. 15.Lantus insulin 54 units in the night and 24 units in the morning as     before. 16.Lisinopril 40 mg daily as before. 17.Magnesium 400 mg 1 capsule daily. 18.Metformin 1000 mg twice daily starting from tomorrow. 19.Oxycodone 1 tablet  every 8 hours as needed as before. 20.Hytrin 4 mg every evening. 21.Spiriva 18 mcg daily. 22.Vitamin B12 1000 mcg daily as before. 23.Metoprolol tartrate 25 mg twice daily.  DIET:  Low salt, low cholesterol, 1800 calories, weight reducing diet. The patient has been extensively counseled regarding monitoring blood pressure and blood sugar daily and diet exercise.  Post cardiac cath instructions have been given.  FOLLOWUP:  Followup with me in 1 week.  CONDITION AT DISCHARGE:  Stable.  If continues to have recurrent chest pain, we will do the [facility], as outpatient to evaluate the significance of ostial LAD stenosis and also to evaluate the significance of CTO of RCA.  BRIEF HISTORY AND HOSPITAL COURSE:  [name] is a 63 year old male with past medical history significant for hypertension, diabetes mellitus, hypercholesteremia, obstructive sleep apnea, morbid obesity, COPD, peripheral neuropathy, was transferred from [facility] Jose [name]. The patient was admitted at [facility] because of exacerbation of COPD with progressive shortness of breath.  Then, subsequently while in the hospital, developed retrosternal chest pain radiating to left arm, relieved with sublingual nitro and morphine. Pain was burning in nature grade 8/10.  The patient was noted to have minimally elevated troponin I, subsequently repeat troponin I was significantly elevated to 4.40.  EKG showed normal sinus rhythm with nonspecific ST-T  wave changes.  The patient was transferred to [facility] for further management.  The patient presently denies any chest pain, nausea, vomiting, diaphoresis.  Denies palpitation, lightheadedness, or syncope.  Denies such pain in the past.  Denies PND, orthopnea, or leg swelling.  PHYSICAL EXAMINATION:  VITAL SIGNS:  His blood pressure was elevated to 217/83, pulse was 109. HEENT:  Conjunctivae was pink. NECK:  Supple.  No JVD.  No  bruit. LUNGS:  Decreased breath sounds at bases with occasional expiratory wheezing. CARDIOVASCULAR:  S1, S2 was normal.  There was soft S4 gallop. ABDOMEN:  Soft.  Bowel sounds were present.  Nontender. EXTREMITIES:  There was no clubbing, cyanosis, or edema.  LABORATORY DATA:  Sodium was 134, potassium 4.8, BUN was 17, creatinine 0.93.  Blood sugar was 240.  Troponin I first set here at [facility] was 3.91, next set was 3.04, next set post PCI was less than 0.03.  His cholesterol was very high at 296.  Triglycerides were also markedly elevated 981.  HDL was 32.  His hemoglobin was 13.6, hematocrit 41.8, white count of 8.8.  His hemoglobin A1c was 10.0.  TSH was in normal range 0.707.  EKG showed normal sinus rhythm with no acute ischemic changes.  BRIEF HOSPITAL COURSE:  The patient was admitted to ICU.  The patient subsequently underwent left cardiac cath with selective left and right coronary angiography, and attempted PCI to RCA which was initially felt to be the culprit lesion for small non-Q-wave myocardial infarction but upon attempted PCI RCA was felt to be chronically occluded which has collaterals from the left system.  The patient did not have any further episodes of chest pain during the hospital stay.  His cardiac enzymes have come back to normal.  EKG postprocedure and in a.m. has remained normal.  His groin is stable with no evidence of hematoma or bruit.  The patient has been ambulating in room and hallway without any problems. The patient will be discharged home on above medications and will be followed up by me in 1 week.  If he continues to have recurrent chest pain, we will pursue further with nuclear stress test and possibly re- attempt PCI to CTO of RCA and possibly FFR to evaluate the significance of ostial LAD stenosis.     Allegra Lai. Jose [name], M.D.     M[facility]/MEDQ  D:  08/29/2014  T:  08/29/2014  Job:  [id number]

## 2014-08-29 NOTE — Discharge Instructions (Signed)
Coronary Angiogram A coronary angiogram, also called coronary angiography, is an X-ray procedure used to look at the arteries in the heart. In this procedure, a dye (contrast dye) is injected through a long, hollow tube (catheter). The catheter is about the size of a piece of cooked spaghetti and is inserted through your groin, wrist, or arm. The dye is injected into each artery, and X-rays are then taken to show if there is a blockage in the arteries of your heart. LET [facility] CARE PROVIDER KNOW ABOUT:  Any allergies you have, including allergies to shellfish or contrast dye.   All medicines you are taking, including vitamins, herbs, eye drops, creams, and over-the-counter medicines.   Previous problems you or members of your family have had with the use of anesthetics.   Any blood disorders you have.   Previous surgeries you have had.  History of kidney problems or failure.   Other medical conditions you have. RISKS AND COMPLICATIONS  Generally, a coronary angiogram is a safe procedure. However, problems can occur and include:  Allergic reaction to the dye.  Bleeding from the access site or other locations.  Kidney injury, especially in people with impaired kidney function.  Stroke (rare).  Heart attack (rare). BEFORE THE PROCEDURE   Do not eat or drink anything after midnight the night before the procedure or as directed by your health care provider.   Ask your health care provider about changing or stopping your regular medicines. This is especially important if you are taking diabetes medicines or blood thinners. PROCEDURE  You may be given a medicine to help you relax (sedative) before the procedure. This medicine is given through an intravenous (IV) access tube that is inserted into one of your veins.   The area where the catheter will be inserted will be washed and shaved. This is usually done in the groin but may be done in the fold of your arm (near your  elbow) or in the wrist.   A medicine will be given to numb the area where the catheter will be inserted (local anesthetic).   The health care provider will insert the catheter into an artery. The catheter will be guided by using a special type of X-ray (fluoroscopy) of the blood vessel being examined.   A special dye will then be injected into the catheter, and X-rays will be taken. The dye will help to show where any narrowing or blockages are located in the heart arteries.  AFTER THE PROCEDURE   If the procedure is done through the leg, you will be kept in bed lying flat for several hours. You will be instructed to not bend or cross your legs.  The insertion site will be checked frequently.   The pulse in your feet or wrist will be checked frequently.   Additional blood tests, X-rays, and an electrocardiogram may be done.  Document Released: 02/20/2003 Document Revised: 12/31/2013 Document Reviewed: 01/08/2013 [facility] Patient Information 2015 [place], [place]. This information is not intended to replace advice given to you by your health care provider. Make sure you discuss any questions you have with your health care provider. Acute Coronary Syndrome Acute coronary syndrome (ACS) is an urgent problem in which the blood and oxygen supply to the heart is critically deficient. ACS requires hospitalization because one or more coronary arteries may be blocked. ACS represents a range of conditions including:  Previous angina that is now unstable, lasts longer, happens at rest, or is more intense.  A  heart attack, with heart muscle cell injury and death. There are three vital coronary arteries that supply the heart muscle with blood and oxygen so that it can pump blood effectively. If blockages to these arteries develop, blood flow to the heart muscle is reduced. If the heart does not get enough blood, angina may occur as the first warning sign. SYMPTOMS   The most common signs of  angina include:  Tightness or squeezing in the chest.  Feeling of heaviness on the chest.  Discomfort in the arms, neck, back, or jaw.  Shortness of breath and nausea.  Cold, wet skin.  Angina is usually brought on by physical effort or excitement which increase the oxygen needs of the heart. These states increase the blood flow needs of the heart beyond what can be delivered.  Other symptoms that are not as common include:  Fatigue  Unexplained feelings of nervousness or anxiety  Weakness  Diarrhea  Sometimes, you may not have noticed any symptoms at all but still suffered a cardiac injury. TREATMENT   Medicines to help discomfort may include nitroglycerin (nitro) in the form of tablets or a spray for rapid relief, or longer-acting forms such as cream, patches, or capsules. (Be aware that there are many side effects and possible interactions with other drugs).  Other medicines may be used to help the heart pump better.  Procedures to open blocked arteries including angioplasty or stent placement to keep the arteries open.  Open heart surgery may be needed when there are many blockages or they are in critical locations that are best treated with surgery. HOME CARE INSTRUCTIONS   Do not use any tobacco products including cigarettes, chewing tobacco, or electronic cigarettes.  Take one baby or adult aspirin daily, if your health care provider advises. This helps reduce the risk of a heart attack.  It is very important that you follow the angina treatment prescribed by your health care provider. Make arrangements for proper follow-up care.  Eat a heart healthy diet with salt and fat restrictions as advised.  Regular exercise is good for you as long as it does not cause discomfort. Do not begin any new type of exercise until you check with your health care provider.  If you are overweight, you should lose weight.  Try to maintain normal blood lipid levels.  Keep your  blood pressure under control as recommended by your health care provider.  You should tell your health care provider right away about any increase in the severity or frequency of your chest discomfort or angina attacks. When you have angina, you should stop what you are doing and sit down. This may bring relief in 3 to 5 minutes. If your health care provider has prescribed nitro, take it as directed.  If your health care provider has given you a follow-up appointment, it is very important to keep that appointment. Not keeping the appointment could result in a chronic or permanent injury, pain, and disability. If there is any problem keeping the appointment, you must call back to this facility for assistance. SEEK IMMEDIATE MEDICAL CARE IF:   You develop nausea, vomiting, or shortness of breath.  You feel faint, lightheaded, or pass out.  Your chest discomfort gets worse.  You are sweating or experience sudden profound fatigue.  You do not get relief of your chest pain after 3 doses of nitro.  Your discomfort lasts longer than 15 minutes. MAKE SURE YOU:   Understand these instructions.  Will watch your  condition.  Will get help right away if you are not doing well or get worse.  Take all medicines as directed by your health care provider. Document Released: 08/16/2005 Document Revised: 08/21/2013 Document Reviewed: 12/18/2013 [facility] Patient Information 2015 [place], [place]. This information is not intended to replace advice given to you by your health care provider. Make sure you discuss any questions you have with your health care provider.

## 2014-08-29 NOTE — Progress Notes (Signed)
Inpatient Diabetes Program Recommendations  AACE/ADA: New Consensus Statement on Inpatient Glycemic Control (2013)  Target Ranges:  Prepandial:   less than 140 mg/dL      Peak postprandial:   less than 180 mg/dL (1-2 hours)      Critically ill patients:  140 - 180 mg/dL   Results for Jose [name], Jose [name] (MRN [id number]) as of 08/29/2014 10:38  Ref. Range 08/28/2014 07:48 08/28/2014 12:56 08/28/2014 18:01 08/28/2014 21:21 08/29/2014 07:03  Glucose-Capillary Latest Range: 70-99 mg/dL 247 (H) 267 (H) 268 (H) 330 (H) 271 (H)    Reason for assessment: elevated CBG  Diabetes history:  Type 2  Outpatient Diabetes medications: Glipizide 5mg  4x/day, Novolog 25 units tid, Lantus 25 units qam, 54 units qpm, glucophage 1000mg  bid Current orders for Inpatient glycemic control: Lantus 40 units daily at hs and Novolog sensitive correction 0-9units tid with meals  Consider increasing Lantus to 50 units qhs and add Novolog 3 units tid with meals. Continue Novolog correction but increase Novolog correction to moderate correction, 0-15 units tid with meals.   Gentry Fitz, RN, BA, MHA, CDE Diabetes Coordinator Inpatient Diabetes Program  [phone] (Team Pager) [phone] [facility]) 08/29/2014 10:48 AM

## 2014-08-29 NOTE — Progress Notes (Signed)
CARDIAC REHAB PHASE I   PRE:  Rate/Rhythm: 76 SR PVCs  BP:  Supine:   Sitting: 172/62  Standing:    SaO2:   MODE:  Ambulation: 100 ft   POST:  Rate/Rhythm: 105 ST  BP:  Supine:   Sitting: 195/80  Standing:    SaO2:  0935-1022 Pt could only walk 100 ft due to legs hurting and SOB. Pt stated he spends a lot of time on couch and mainly walks in house. He knows that he needs to try to walk more as he also said he knows he needs to watch diabetic diet. Pt stated he has been to diabetic diet classes but he eats and sits a lot. Encouraged pt to take little steps with walking-- starting at 100 ft a day, three times a day and adding a little feet distance each day. Pt's wife stated they are thinking about buying a recumbent bike for all of them to use.  Discussed pt using with no resistance and again doing a little more each day. Gave diabetic diet of carb counting and suggested some heart healthy tips too. Declined CRP 2 due to financial issues and rather use bike at home.  Pt seems motivated to make changes but we discussed not getting discouraged at having to start with small steps.  Gave MI booklet and reviewed NTG use. Pt seemed exhausted as he stated it had been rough few days. Emotional support given.   Graylon Good, RN BSN  08/29/2014 10:18 AM

## 2014-08-29 NOTE — Discharge Summary (Signed)
Discharge summary dictated on 08/29/2014 dictation number is [phone]

## 2015-01-17 DIAGNOSIS — E11359 Type 2 diabetes mellitus with proliferative diabetic retinopathy without macular edema: Secondary | ICD-10-CM | POA: Diagnosis not present

## 2015-01-17 DIAGNOSIS — H3561 Retinal hemorrhage, right eye: Secondary | ICD-10-CM | POA: Diagnosis not present

## 2015-01-17 DIAGNOSIS — E11349 Type 2 diabetes mellitus with severe nonproliferative diabetic retinopathy without macular edema: Secondary | ICD-10-CM | POA: Diagnosis not present

## 2015-08-14 ENCOUNTER — Encounter: Payer: Self-pay | Admitting: Gastroenterology

## 2015-08-31 DIAGNOSIS — G459 Transient cerebral ischemic attack, unspecified: Secondary | ICD-10-CM

## 2015-08-31 HISTORY — DX: Transient cerebral ischemic attack, unspecified: G45.9

## 2015-11-13 DIAGNOSIS — I10 Essential (primary) hypertension: Secondary | ICD-10-CM | POA: Diagnosis not present

## 2015-11-13 DIAGNOSIS — S91112A Laceration without foreign body of left great toe without damage to nail, initial encounter: Secondary | ICD-10-CM | POA: Diagnosis not present

## 2016-02-24 ENCOUNTER — Other Ambulatory Visit: Payer: Self-pay

## 2016-02-24 ENCOUNTER — Inpatient Hospital Stay (HOSPITAL_COMMUNITY)
Admission: EM | Admit: 2016-02-24 | Discharge: 2016-02-27 | DRG: 291 | Disposition: A | Discharge status: Home / Self Care | Payer: Medicare Other | Attending: Internal Medicine | Admitting: Internal Medicine

## 2016-02-24 ENCOUNTER — Encounter (HOSPITAL_COMMUNITY): Payer: Self-pay | Admitting: Emergency Medicine

## 2016-02-24 ENCOUNTER — Emergency Department (HOSPITAL_COMMUNITY): Payer: Medicare Other

## 2016-02-24 DIAGNOSIS — I2510 Atherosclerotic heart disease of native coronary artery without angina pectoris: Secondary | ICD-10-CM | POA: Diagnosis present

## 2016-02-24 DIAGNOSIS — Z9111 Patient's noncompliance with dietary regimen: Secondary | ICD-10-CM | POA: Diagnosis not present

## 2016-02-24 DIAGNOSIS — E785 Hyperlipidemia, unspecified: Secondary | ICD-10-CM | POA: Diagnosis present

## 2016-02-24 DIAGNOSIS — I509 Heart failure, unspecified: Secondary | ICD-10-CM | POA: Diagnosis not present

## 2016-02-24 DIAGNOSIS — I110 Hypertensive heart disease with heart failure: Principal | ICD-10-CM | POA: Diagnosis present

## 2016-02-24 DIAGNOSIS — I10 Essential (primary) hypertension: Secondary | ICD-10-CM

## 2016-02-24 DIAGNOSIS — J9601 Acute respiratory failure with hypoxia: Secondary | ICD-10-CM

## 2016-02-24 DIAGNOSIS — J449 Chronic obstructive pulmonary disease, unspecified: Secondary | ICD-10-CM | POA: Diagnosis not present

## 2016-02-24 DIAGNOSIS — Z7902 Long term (current) use of antithrombotics/antiplatelets: Secondary | ICD-10-CM | POA: Diagnosis not present

## 2016-02-24 DIAGNOSIS — Z87891 Personal history of nicotine dependence: Secondary | ICD-10-CM | POA: Diagnosis not present

## 2016-02-24 DIAGNOSIS — R7989 Other specified abnormal findings of blood chemistry: Secondary | ICD-10-CM | POA: Diagnosis not present

## 2016-02-24 DIAGNOSIS — I5033 Acute on chronic diastolic (congestive) heart failure: Secondary | ICD-10-CM | POA: Diagnosis present

## 2016-02-24 DIAGNOSIS — Z6841 Body Mass Index (BMI) 40.0 and over, adult: Secondary | ICD-10-CM

## 2016-02-24 DIAGNOSIS — J810 Acute pulmonary edema: Secondary | ICD-10-CM | POA: Diagnosis not present

## 2016-02-24 DIAGNOSIS — E1140 Type 2 diabetes mellitus with diabetic neuropathy, unspecified: Secondary | ICD-10-CM | POA: Diagnosis present

## 2016-02-24 DIAGNOSIS — E118 Type 2 diabetes mellitus with unspecified complications: Secondary | ICD-10-CM | POA: Diagnosis not present

## 2016-02-24 DIAGNOSIS — R0902 Hypoxemia: Secondary | ICD-10-CM

## 2016-02-24 DIAGNOSIS — I152 Hypertension secondary to endocrine disorders: Secondary | ICD-10-CM | POA: Diagnosis present

## 2016-02-24 DIAGNOSIS — Z794 Long term (current) use of insulin: Secondary | ICD-10-CM | POA: Diagnosis not present

## 2016-02-24 DIAGNOSIS — E1159 Type 2 diabetes mellitus with other circulatory complications: Secondary | ICD-10-CM | POA: Diagnosis present

## 2016-02-24 DIAGNOSIS — I214 Non-ST elevation (NSTEMI) myocardial infarction: Secondary | ICD-10-CM

## 2016-02-24 DIAGNOSIS — I5031 Acute diastolic (congestive) heart failure: Secondary | ICD-10-CM | POA: Diagnosis not present

## 2016-02-24 DIAGNOSIS — G4733 Obstructive sleep apnea (adult) (pediatric): Secondary | ICD-10-CM

## 2016-02-24 DIAGNOSIS — E119 Type 2 diabetes mellitus without complications: Secondary | ICD-10-CM

## 2016-02-24 DIAGNOSIS — Z7982 Long term (current) use of aspirin: Secondary | ICD-10-CM

## 2016-02-24 DIAGNOSIS — I252 Old myocardial infarction: Secondary | ICD-10-CM

## 2016-02-24 DIAGNOSIS — R778 Other specified abnormalities of plasma proteins: Secondary | ICD-10-CM | POA: Diagnosis present

## 2016-02-24 HISTORY — DX: Hyperlipidemia, unspecified: E78.5

## 2016-02-24 HISTORY — DX: Atherosclerotic heart disease of native coronary artery without angina pectoris: I25.10

## 2016-02-24 HISTORY — DX: Morbid (severe) obesity due to excess calories: E66.01

## 2016-02-24 HISTORY — DX: Type 2 diabetes mellitus without complications: E11.9

## 2016-02-24 HISTORY — DX: Personal history of nicotine dependence: Z87.891

## 2016-02-24 LAB — TROPONIN I
TROPONIN I: 0.2 ng/mL — AB (ref <0.03)
TROPONIN I: 0.2 ng/mL — AB (ref <0.03)

## 2016-02-24 LAB — CBC WITH DIFFERENTIAL/PLATELET
BASOS ABS: 0 10*3/uL (ref 0.0–0.1)
BASOS PCT: 0 %
EOS ABS: 0.1 10*3/uL (ref 0.0–0.7)
Eosinophils Relative: 1 %
HCT: 45.6 % (ref 39.0–52.0)
HEMOGLOBIN: 14.8 g/dL (ref 13.0–17.0)
Lymphocytes Relative: 18 %
Lymphs Abs: 1.4 10*3/uL (ref 0.7–4.0)
MCH: 29.5 pg (ref 26.0–34.0)
MCHC: 32.5 g/dL (ref 30.0–36.0)
MCV: 90.8 fL (ref 78.0–100.0)
Monocytes Absolute: 0.5 10*3/uL (ref 0.1–1.0)
Monocytes Relative: 6 %
NEUTROS PCT: 75 %
Neutro Abs: 5.7 10*3/uL (ref 1.7–7.7)
Platelets: 188 10*3/uL (ref 150–400)
RBC: 5.02 MIL/uL (ref 4.22–5.81)
RDW: 14.6 % (ref 11.5–15.5)
WBC: 7.7 10*3/uL (ref 4.0–10.5)

## 2016-02-24 LAB — COMPREHENSIVE METABOLIC PANEL
ALBUMIN: 3.2 g/dL — AB (ref 3.5–5.0)
ALT: 19 U/L (ref 17–63)
ANION GAP: 9 (ref 5–15)
AST: 22 U/L (ref 15–41)
Alkaline Phosphatase: 53 U/L (ref 38–126)
BUN: 15 mg/dL (ref 6–20)
CHLORIDE: 95 mmol/L — AB (ref 101–111)
CO2: 32 mmol/L (ref 22–32)
Calcium: 8.6 mg/dL — ABNORMAL LOW (ref 8.9–10.3)
Creatinine, Ser: 0.82 mg/dL (ref 0.61–1.24)
GFR calc non Af Amer: >60 mL/min (ref >60)
Glucose, Bld: 271 mg/dL — ABNORMAL HIGH (ref 65–99)
Potassium: 3.7 mmol/L (ref 3.5–5.1)
SODIUM: 136 mmol/L (ref 135–145)
Total Bilirubin: 0.5 mg/dL (ref 0.3–1.2)
Total Protein: 7 g/dL (ref 6.5–8.1)

## 2016-02-24 LAB — GLUCOSE, CAPILLARY
GLUCOSE-CAPILLARY: 173 mg/dL — AB (ref 65–99)
GLUCOSE-CAPILLARY: 213 mg/dL — AB (ref 65–99)

## 2016-02-24 LAB — BRAIN NATRIURETIC PEPTIDE: B Natriuretic Peptide: 75 pg/mL (ref 0.0–100.0)

## 2016-02-24 MED ORDER — CLOPIDOGREL BISULFATE 75 MG PO TABS
75.0000 mg | ORAL_TABLET | Freq: Every day | ORAL | Status: DC
  Administered 2016-02-25 – 2016-02-27 (×3): 75 mg via ORAL
  Filled 2016-02-24 (×3): qty 1

## 2016-02-24 MED ORDER — SODIUM CHLORIDE 0.9 % IV SOLN: 250.0000 mL | INTRAVENOUS | Status: DC | PRN

## 2016-02-24 MED ORDER — GLIPIZIDE 5 MG PO TABS
5.0000 mg | ORAL_TABLET | Freq: Three times a day (TID) | ORAL | Status: DC
  Administered 2016-02-24 – 2016-02-27 (×9): 5 mg via ORAL
  Filled 2016-02-24 (×10): qty 1

## 2016-02-24 MED ORDER — ATORVASTATIN CALCIUM 40 MG PO TABS
80.0000 mg | ORAL_TABLET | Freq: Every day | ORAL | Status: DC
Start: 2016-02-25 — End: 2016-02-27
  Administered 2016-02-26: 80 mg via ORAL
  Filled 2016-02-24 (×2): qty 2

## 2016-02-24 MED ORDER — INSULIN GLARGINE 100 UNIT/ML ~~LOC~~ SOLN: 25.0000 [IU] | SUBCUTANEOUS | Status: DC

## 2016-02-24 MED ORDER — ASPIRIN 81 MG PO CHEW
81.0000 mg | CHEWABLE_TABLET | Freq: Every day | ORAL | Status: DC
Start: 2016-02-24 — End: 2016-02-27
  Administered 2016-02-24 – 2016-02-27 (×4): 81 mg via ORAL
  Filled 2016-02-24 (×4): qty 1

## 2016-02-24 MED ORDER — ACETAMINOPHEN 325 MG PO TABS: 650.0000 mg | ORAL_TABLET | ORAL | Status: DC | PRN

## 2016-02-24 MED ORDER — INSULIN ASPART 100 UNIT/ML ~~LOC~~ SOLN
0.0000 [IU] | Freq: Every day | SUBCUTANEOUS | Status: DC
  Administered 2016-02-24 – 2016-02-25 (×2): 2 [IU] via SUBCUTANEOUS

## 2016-02-24 MED ORDER — METOPROLOL TARTRATE 50 MG PO TABS
50.0000 mg | ORAL_TABLET | Freq: Two times a day (BID) | ORAL | Status: DC
  Administered 2016-02-24 – 2016-02-27 (×6): 50 mg via ORAL
  Filled 2016-02-24 (×6): qty 1

## 2016-02-24 MED ORDER — INSULIN ASPART 100 UNIT/ML ~~LOC~~ SOLN
0.0000 [IU] | Freq: Three times a day (TID) | SUBCUTANEOUS | Status: DC
  Administered 2016-02-25: 5 [IU] via SUBCUTANEOUS
  Administered 2016-02-25: 3 [IU] via SUBCUTANEOUS
  Administered 2016-02-26: 5 [IU] via SUBCUTANEOUS
  Administered 2016-02-26: 3 [IU] via SUBCUTANEOUS
  Administered 2016-02-26 – 2016-02-27 (×2): 5 [IU] via SUBCUTANEOUS
  Administered 2016-02-27: 8 [IU] via SUBCUTANEOUS

## 2016-02-24 MED ORDER — ARFORMOTEROL TARTRATE 15 MCG/2ML IN NEBU
15.0000 ug | INHALATION_SOLUTION | Freq: Two times a day (BID) | RESPIRATORY_TRACT | Status: DC
  Administered 2016-02-24 – 2016-02-27 (×6): 15 ug via RESPIRATORY_TRACT
  Filled 2016-02-24 (×6): qty 2

## 2016-02-24 MED ORDER — TIOTROPIUM BROMIDE MONOHYDRATE 18 MCG IN CAPS
18.0000 ug | ORAL_CAPSULE | Freq: Every day | RESPIRATORY_TRACT | Status: DC
  Administered 2016-02-25 – 2016-02-26 (×2): 18 ug via RESPIRATORY_TRACT
  Filled 2016-02-24: qty 5

## 2016-02-24 MED ORDER — SERTRALINE HCL 50 MG PO TABS
100.0000 mg | ORAL_TABLET | Freq: Every day | ORAL | Status: DC
  Administered 2016-02-25 – 2016-02-27 (×3): 100 mg via ORAL
  Filled 2016-02-24 (×3): qty 2

## 2016-02-24 MED ORDER — INSULIN ASPART 100 UNIT/ML ~~LOC~~ SOLN
25.0000 [IU] | Freq: Three times a day (TID) | SUBCUTANEOUS | Status: DC
  Administered 2016-02-25: 3 [IU] via SUBCUTANEOUS
  Administered 2016-02-25 – 2016-02-27 (×8): 25 [IU] via SUBCUTANEOUS

## 2016-02-24 MED ORDER — DOCUSATE SODIUM 100 MG PO CAPS
100.0000 mg | ORAL_CAPSULE | Freq: Every day | ORAL | Status: DC
  Administered 2016-02-25 – 2016-02-27 (×3): 100 mg via ORAL
  Filled 2016-02-24 (×4): qty 1

## 2016-02-24 MED ORDER — TERAZOSIN HCL 1 MG PO CAPS
4.0000 mg | ORAL_CAPSULE | Freq: Every evening | ORAL | Status: DC
  Administered 2016-02-24 – 2016-02-26 (×3): 4 mg via ORAL
  Filled 2016-02-24 (×3): qty 4

## 2016-02-24 MED ORDER — HYDROCODONE-ACETAMINOPHEN 5-325 MG PO TABS
2.0000 | ORAL_TABLET | ORAL | Status: DC | PRN
  Administered 2016-02-24 – 2016-02-27 (×13): 2 via ORAL
  Filled 2016-02-24 (×13): qty 2

## 2016-02-24 MED ORDER — ONDANSETRON HCL 4 MG/2ML IJ SOLN: 4.0000 mg | Freq: Four times a day (QID) | INTRAMUSCULAR | Status: DC | PRN

## 2016-02-24 MED ORDER — INSULIN GLARGINE 100 UNIT/ML ~~LOC~~ SOLN
25.0000 [IU] | Freq: Every day | SUBCUTANEOUS | Status: DC
  Administered 2016-02-25 – 2016-02-27 (×3): 25 [IU] via SUBCUTANEOUS
  Filled 2016-02-24 (×6): qty 0.25

## 2016-02-24 MED ORDER — FUROSEMIDE 10 MG/ML IJ SOLN
80.0000 mg | Freq: Once | INTRAMUSCULAR | Status: AC
  Administered 2016-02-24: 80 mg via INTRAVENOUS
  Filled 2016-02-24: qty 8

## 2016-02-24 MED ORDER — ENOXAPARIN SODIUM 80 MG/0.8ML ~~LOC~~ SOLN
70.0000 mg | SUBCUTANEOUS | Status: DC
  Administered 2016-02-24 – 2016-02-26 (×3): 70 mg via SUBCUTANEOUS
  Filled 2016-02-24 (×3): qty 0.8

## 2016-02-24 MED ORDER — SODIUM CHLORIDE 0.9% FLUSH: 3.0000 mL | INTRAVENOUS | Status: DC | PRN

## 2016-02-24 MED ORDER — VITAMIN B-12 1000 MCG PO TABS
1000.0000 ug | ORAL_TABLET | Freq: Every day | ORAL | Status: DC
  Administered 2016-02-25 – 2016-02-27 (×3): 1000 ug via ORAL
  Filled 2016-02-24 (×3): qty 1

## 2016-02-24 MED ORDER — INSULIN GLARGINE 100 UNIT/ML ~~LOC~~ SOLN
54.0000 [IU] | Freq: Every day | SUBCUTANEOUS | Status: DC
  Administered 2016-02-24 – 2016-02-26 (×3): 54 [IU] via SUBCUTANEOUS
  Filled 2016-02-24 (×6): qty 0.54

## 2016-02-24 MED ORDER — FUROSEMIDE 10 MG/ML IJ SOLN
60.0000 mg | Freq: Two times a day (BID) | INTRAMUSCULAR | Status: DC
  Administered 2016-02-24 – 2016-02-26 (×4): 60 mg via INTRAVENOUS
  Filled 2016-02-24 (×4): qty 6

## 2016-02-24 MED ORDER — ASPIRIN 325 MG PO TABS
325.0000 mg | ORAL_TABLET | Freq: Once | ORAL | Status: AC
  Administered 2016-02-24: 325 mg via ORAL
  Filled 2016-02-24: qty 1

## 2016-02-24 MED ORDER — LISINOPRIL 10 MG PO TABS
40.0000 mg | ORAL_TABLET | Freq: Every day | ORAL | Status: DC
  Administered 2016-02-25 – 2016-02-27 (×3): 40 mg via ORAL
  Filled 2016-02-24 (×3): qty 4

## 2016-02-24 MED ORDER — SODIUM CHLORIDE 0.9% FLUSH
3.0000 mL | Freq: Two times a day (BID) | INTRAVENOUS | Status: DC
  Administered 2016-02-24 – 2016-02-27 (×4): 3 mL via INTRAVENOUS

## 2016-02-24 NOTE — ED Notes (Signed)
Pt states he has become more short of breath over the past 3 days with increased swelling in legs.  Denies chest pain.  States he was on home o2 until about 3 months ago and they discontinued it.

## 2016-02-24 NOTE — H&P (Signed)
History and Physical    BRANIGAN SCHOLES [id number] DOB: Apr 13, 1951 DOA: 02/24/2016  PCP: No PCP Per Patient Patient is followed at the [facility] Patient coming from: home  Chief Complaint: shortness of breath  HPI: NITAI CONSER is a 65 y.o. male with medical history significant of COPD, coronary artery disease who presented to the hospital with a 3 day history of shortness of breath. Patient reports that he has noted progressive worsening of shortness of breath and that he's had worsening dyspnea on exertion. He is able to do less without becoming short of breath. He denies any chest pain. No cough, fever, wheezing. He's noticed worsening swelling in his lower extremities, but reports that this is a chronic issue that has been dealing with for several months. He is chronically on Lasix and reports compliance. He does admit to dietary noncompliance with excess salt intake as well as possible excess fluid intake. While at home, he checked his oxygen saturations on a pulse oximeter was noted to be in the 80s. He came to the ER for evaluation  ED Course: While in the ED, chest x-ray showed evidence of interstitial edema and he was noted to have evidence of volume overload with significant pedal edema. Interestingly, BNP was noted to be normal range. Troponin was mildly elevated at 0.2. He's been referred for admission.  Review of Systems: As per HPI otherwise 10 point review of systems negative.    Past Medical History  Diagnosis Date  . Hypertension   . Sleep apnea   . COPD (chronic obstructive pulmonary disease) ([place])   . Diabetes mellitus without complication ([place])   . Neuropathy [facility])     Past Surgical History  Procedure Laterality Date  . Back surgery    . Left heart catheterization with coronary angiogram N/A 08/28/2014    Procedure: LEFT HEART CATHETERIZATION WITH CORONARY ANGIOGRAM;  Surgeon: Clent Demark, MD;  Location: [facility] CATH LAB;  Service: Cardiovascular;  Laterality: N/A;     reports that he has quit smoking. His smoking use included Cigarettes. He has a 135 pack-year smoking history. He does not have any smokeless tobacco history on file. He reports that he does not drink alcohol. His drug history is not on file.  No Known Allergies  Family history:  Family history reviewed and not pertinent  Prior to Admission medications   Medication Sig Start Date End Date Taking? Authorizing Provider  HYDROcodone-acetaminophen (NORCO/VICODIN) 5-325 MG tablet Take 1 tablet by mouth every 4 (four) hours as needed for moderate pain (Takes 2 tabs every 4 hours).   Yes Historical Provider, MD  metoprolol (LOPRESSOR) 50 MG tablet Take 50 mg by mouth 2 (two) times daily.   Yes Historical Provider, MD  sertraline (ZOLOFT) 100 MG tablet Take 100 mg by mouth daily.   Yes Historical Provider, MD  albuterol (PROVENTIL HFA;VENTOLIN HFA) 108 (90 BASE) MCG/ACT inhaler Inhale 2 puffs into the lungs every 6 (six) hours as needed for wheezing or shortness of breath.    Historical Provider, MD  amLODipine (NORVASC) 5 MG tablet Take 1 tablet (5 mg total) by mouth daily. 08/29/14   Charolette Forward, MD  aspirin 81 MG tablet Take 81 mg by mouth daily.    Historical Provider, MD  atorvastatin (LIPITOR) 80 MG tablet Take 1 tablet (80 mg total) by mouth daily at 6 PM. 08/29/14   Charolette Forward, MD  clopidogrel (PLAVIX) 75 MG tablet Take 1 tablet (75 mg total) by mouth daily with breakfast. 08/29/14  Charolette Forward, MD  docusate sodium (COLACE) 100 MG capsule Take 100-200 mg by mouth daily. Take one tablet when constipation starts if continues take another one.    Historical Provider, MD  ergocalciferol (VITAMIN D2) 50000 UNITS capsule Take 50,000 Units by mouth See admin instructions. Take one twice a week on wednesdays and saturdays    Historical Provider, MD  ferrous sulfate 325 (65 FE) MG tablet Take 325 mg by mouth 3 (three) times daily with meals.    Historical Provider, MD  formoterol (FORADIL) 12 MCG  capsule for inhaler Place 12 mcg into inhaler and inhale 2 (two) times daily.    Historical Provider, MD  furosemide (LASIX) 40 MG tablet Take 40 mg by mouth 2 (two) times daily.    Historical Provider, MD  glipiZIDE (GLUCOTROL) 5 MG tablet Take 5 mg by mouth 4 (four) times daily. Take 4 times a day per list    Historical Provider, MD  insulin aspart (NOVOLOG) 100 UNIT/ML injection Inject 25 Units into the skin 3 (three) times daily before meals.     Historical Provider, MD  insulin glargine (LANTUS) 100 UNIT/ML injection Inject 25-54 Units into the skin See admin instructions. 25 units in the AM and 54 units in the PM    Historical Provider, MD  lisinopril (PRINIVIL,ZESTRIL) 40 MG tablet Take 40 mg by mouth daily.    Historical Provider, MD  Magnesium 400 MG CAPS Take 1 capsule by mouth daily.    Historical Provider, MD  metFORMIN (GLUCOPHAGE) 1000 MG tablet Take 1 tablet (1,000 mg total) by mouth 2 (two) times daily with a meal. 08/30/14   Charolette Forward, MD  Multiple Vitamins-Minerals (VISION-VITE PRESERVE PO) Take 1 tablet by mouth daily.    Historical Provider, MD  nitroGLYCERIN (NITROSTAT) 0.4 MG SL tablet Place 1 tablet (0.4 mg total) under the tongue every 5 (five) minutes x 3 doses as needed for chest pain. 08/29/14   Charolette Forward, MD  Omega-3 Fatty Acids (FISH OIL) 1000 MG CAPS Take 1 capsule by mouth See admin instructions. Take 8 tables a day to lower triglycerides per list    Historical Provider, MD  oxyCODONE-acetaminophen (PERCOCET/ROXICET) 5-325 MG per tablet Take 1 tablet by mouth See admin instructions. Take eight tablets daily as needed for pain per family and list. For bad  Back pain    Historical Provider, MD  terazosin (HYTRIN) 2 MG capsule Take 4 mg by mouth every evening.    Historical Provider, MD  tiotropium (SPIRIVA) 18 MCG inhalation capsule Place 18 mcg into inhaler and inhale at bedtime.     Historical Provider, MD  vitamin B-12 (CYANOCOBALAMIN) 1000 MCG tablet Take 1,000  mcg by mouth daily.    Historical Provider, MD    Physical Exam: Filed Vitals:   02/24/16 1430 02/24/16 1500 02/24/16 1530 02/24/16 1559  BP: 168/80 173/76 175/78 193/69  Pulse: 82 84 82 88  Temp:    98.5 F (36.9 C)  TempSrc:    Oral  Resp: 18 19  18   Height:    6' (1.829 m)  Weight:      SpO2: 97% 96% 96% 94%      Constitutional: NAD, calm, comfortable Filed Vitals:   02/24/16 1430 02/24/16 1500 02/24/16 1530 02/24/16 1559  BP: 168/80 173/76 175/78 193/69  Pulse: 82 84 82 88  Temp:    98.5 F (36.9 C)  TempSrc:    Oral  Resp: 18 19  18   Height:    6' (1.829  m)  Weight:      SpO2: 97% 96% 96% 94%   Eyes: PERRL, lids and conjunctivae normal ENMT: Mucous membranes are moist. Posterior pharynx clear of any exudate or lesions.Normal dentition.  Neck: normal, supple, no masses, no thyromegaly Respiratory: clear to auscultation bilaterally, no wheezing, no crackles. Normal respiratory effort. No accessory muscle use.  Cardiovascular: Regular rate and rhythm, no murmurs / rubs / gallops. 2-3+ extremity edema. 2+ pedal pulses. No carotid bruits.  Abdomen: no tenderness, no masses palpated. No hepatosplenomegaly. Bowel sounds positive.  Musculoskeletal: no clubbing / cyanosis. No joint deformity upper and lower extremities. Good ROM, no contractures. Normal muscle tone.  Skin: venous stasis changes in LE bilaterally Neurologic: CN 2-12 grossly intact. Sensation intact, DTR normal. Strength 5/5 in all 4.  Psychiatric: Normal judgment and insight. Alert and oriented x 3. Normal mood.     Labs on Admission: I have personally reviewed following labs and imaging studies  CBC:  Recent Labs Lab 02/24/16 1120  WBC 7.7  NEUTROABS 5.7  HGB 14.8  HCT 45.6  MCV 90.8  PLT 0000000   Basic Metabolic Panel:  Recent Labs Lab 02/24/16 1120  NA 136  K 3.7  CL 95*  CO2 32  GLUCOSE 271*  BUN 15  CREATININE 0.82  CALCIUM 8.6*   GFR: Estimated Creatinine Clearance: 132.2 mL/min  (by C-G formula based on Cr of 0.82). Liver Function Tests:  Recent Labs Lab 02/24/16 1120  AST 22  ALT 19  ALKPHOS 53  BILITOT 0.5  PROT 7.0  ALBUMIN 3.2*   No results for input(s): LIPASE, AMYLASE in the last 168 hours. No results for input(s): AMMONIA in the last 168 hours. Coagulation Profile: No results for input(s): INR, PROTIME in the last 168 hours. Cardiac Enzymes:  Recent Labs Lab 02/24/16 1120  TROPONINI 0.20*   BNP (last 3 results) No results for input(s): PROBNP in the last 8760 hours. HbA1C: No results for input(s): HGBA1C in the last 72 hours. CBG:  Recent Labs Lab 02/24/16 1607  GLUCAP 173*   Lipid Profile: No results for input(s): CHOL, HDL, LDLCALC, TRIG, CHOLHDL, LDLDIRECT in the last 72 hours. Thyroid Function Tests: No results for input(s): TSH, T4TOTAL, FREET4, T3FREE, THYROIDAB in the last 72 hours. Anemia Panel: No results for input(s): VITAMINB12, FOLATE, FERRITIN, TIBC, IRON, RETICCTPCT in the last 72 hours. Urine analysis: No results found for: COLORURINE, APPEARANCEUR, LABSPEC, PHURINE, GLUCOSEU, HGBUR, BILIRUBINUR, KETONESUR, PROTEINUR, UROBILINOGEN, NITRITE, LEUKOCYTESUR Sepsis Labs: !!!!!!!!!!!!!!!!!!!!!!!!!!!!!!!!!!!!!!!!!!!! @LABRCNTIP (procalcitonin:4,lacticidven:4) )No results found for this or any previous visit (from the past 240 hour(s)).   Radiological Exams on Admission: Dg Chest 2 View  02/24/2016  CLINICAL DATA:  Evaluate for pulmonary edema EXAM: CHEST  2 VIEW COMPARISON:  08/26/2014 FINDINGS: Chronic hyperinflation. Few, new Kerley lines compatible with mild edema. Chronic cardiomegaly. Stable aortic and hilar contours. No indication of pneumonia. No acute osseous finding. IMPRESSION: Mild interstitial pulmonary edema. Electronically Signed   By: Monte Fantasia M.D.   On: 02/24/2016 13:17    EKG: Independently reviewed. Poor quality EKG, this will be repeated  Assessment/Plan Active Problems:   CHF exacerbation  (HCC)   COPD (chronic obstructive pulmonary disease) (HCC)   Acute respiratory failure with hypoxia (HCC)   HTN (hypertension)   Diabetes (HCC)   Morbid obesity (HCC)   OSA (obstructive sleep apnea)   Elevated troponin   1. Acute CHF exacerbation. We'll start the patient on intravenous Lasix. Monitor intake and output. Check echocardiogram to assess LV function. He  is already on a beta blocker and ACE inhibitor. Will consult cardiology for further assistance.  2. Acute respiratory failure with hypoxia. Likely related to CHF exacerbation. Try to wean off oxygen as tolerated.  3. Elevated troponin. Likely demand ischemia in the setting of CHF exacerbation. Continue to monitor troponin. He does not have any chest pain.  4. History of coronary artery disease. Last cardiac catheterization in 2015. Found to have an occluded RCA at that time that was not amenable to intervention. He is continued on aspirin and Plavix.  5. Diabetes. Continue home insulin regimen with Lantus and NovoLog. Supplemental sliding scale insulin.  6. Obstructive sleep apnea. Continue CPAP  7. Hypertension. Continue outpatient regimen  8. COPD . no evidence of wheezing or exacerbation. Continue outpatient bronchodilators.  9. Morbid obesity. Discussed importance of diet and exercise.    DVT prophylaxis: lovenox Code Status: full code Family Communication: discussed with patient and wife at the bedside Disposition Plan: discharge home once improved Consults called: cardiology Admission status: inpatient, telemetry   Densil Ottey MD [facility] Pager [phone]  If 7PM-7AM, please contact night-coverage [email or web address] Password [facility]  02/24/2016, 6:34 PM

## 2016-02-24 NOTE — ED Provider Notes (Addendum)
CSN: [id number]     Arrival date & time 02/24/16  1110 History  By signing my name below, I, Eustaquio Maize, attest that this documentation has been prepared under the direction and in the presence of Merrily Pew, MD. Electronically Signed: Eustaquio Maize, ED Scribe. 02/24/2016. 11:25 AM.   Chief Complaint  Patient presents with  . Shortness of Breath   The history is provided by the patient. No language interpreter was used.    HPI Comments: FAISAL NELLUM is a 65 y.o. male with PMhx HTN, COPD, sleep apnea, and DM who presents to the Emergency Department complaining of gradual onset, constant, worsening, shortness of breath x 3 days. Pt also complains of swelling in his bilateral legs. He was on home O2 until about 3 months ago when he was taken off of it. Pt's O2 saturation was 83% on RA. Wife mentions that when pt is active he becomes short of breath. The shortness of breath resolves after a couple of minutes of rest. Pt had a CXR at the [place] a couple of weeks ago and was placed on antibiotics for an infection. Pt had a MI last year with a cardiac catheterization. Pt did not require stents at that time. Denies chest pain or any other associated symptoms.   Past Medical History  Diagnosis Date  . Hypertension   . Sleep apnea   . COPD (chronic obstructive pulmonary disease) ([place])   . Diabetes mellitus without complication ([place])   . Neuropathy [facility])    Past Surgical History  Procedure Laterality Date  . Back surgery    . Left heart catheterization with coronary angiogram N/A 08/28/2014    Procedure: LEFT HEART CATHETERIZATION WITH CORONARY ANGIOGRAM;  Surgeon: Clent Demark, MD;  Location: [facility] CATH LAB;  Service: Cardiovascular;  Laterality: N/A;   History reviewed. No pertinent family history. Social History  Substance Use Topics  . Smoking status: Former Smoker -- 3.00 packs/day for 45 years    Types: Cigarettes  . Smokeless tobacco: None  . Alcohol Use: No    Review of Systems   Respiratory: Positive for shortness of breath.   Cardiovascular: Positive for leg swelling. Negative for chest pain.  All other systems reviewed and are negative.   Allergies  Review of patient's allergies indicates no known allergies.  Home Medications   Prior to Admission medications   Medication Sig Start Date End Date Taking? Authorizing Provider  albuterol (PROVENTIL HFA;VENTOLIN HFA) 108 (90 BASE) MCG/ACT inhaler Inhale 2 puffs into the lungs every 6 (six) hours as needed for wheezing or shortness of breath.    Historical Provider, MD  amLODipine (NORVASC) 5 MG tablet Take 1 tablet (5 mg total) by mouth daily. 08/29/14   Charolette Forward, MD  aspirin 81 MG tablet Take 81 mg by mouth daily.    Historical Provider, MD  atorvastatin (LIPITOR) 80 MG tablet Take 1 tablet (80 mg total) by mouth daily at 6 PM. 08/29/14   Charolette Forward, MD  clopidogrel (PLAVIX) 75 MG tablet Take 1 tablet (75 mg total) by mouth daily with breakfast. 08/29/14   Charolette Forward, MD  docusate sodium (COLACE) 100 MG capsule Take 100-200 mg by mouth daily. Take one tablet when constipation starts if continues take another one.    Historical Provider, MD  ergocalciferol (VITAMIN D2) 50000 UNITS capsule Take 50,000 Units by mouth See admin instructions. Take one twice a week on wednesdays and saturdays    Historical Provider, MD  ferrous sulfate 325 (65  FE) MG tablet Take 325 mg by mouth 3 (three) times daily with meals.    Historical Provider, MD  formoterol (FORADIL) 12 MCG capsule for inhaler Place 12 mcg into inhaler and inhale 2 (two) times daily.    Historical Provider, MD  furosemide (LASIX) 40 MG tablet Take 40 mg by mouth 2 (two) times daily.    Historical Provider, MD  glipiZIDE (GLUCOTROL) 5 MG tablet Take 5 mg by mouth 4 (four) times daily. Take 4 times a day per list    Historical Provider, MD  insulin aspart (NOVOLOG) 100 UNIT/ML injection Inject 25 Units into the skin 3 (three) times daily before meals.      Historical Provider, MD  insulin glargine (LANTUS) 100 UNIT/ML injection Inject 25-54 Units into the skin See admin instructions. 25 units in the AM and 54 units in the PM    Historical Provider, MD  lisinopril (PRINIVIL,ZESTRIL) 40 MG tablet Take 40 mg by mouth daily.    Historical Provider, MD  Magnesium 400 MG CAPS Take 1 capsule by mouth daily.    Historical Provider, MD  metFORMIN (GLUCOPHAGE) 1000 MG tablet Take 1 tablet (1,000 mg total) by mouth 2 (two) times daily with a meal. 08/30/14   Charolette Forward, MD  metoprolol tartrate (LOPRESSOR) 25 MG tablet Take 1 tablet (25 mg total) by mouth 2 (two) times daily. 08/29/14   Charolette Forward, MD  Multiple Vitamins-Minerals (VISION-VITE PRESERVE PO) Take 1 tablet by mouth daily.    Historical Provider, MD  nitroGLYCERIN (NITROSTAT) 0.4 MG SL tablet Place 1 tablet (0.4 mg total) under the tongue every 5 (five) minutes x 3 doses as needed for chest pain. 08/29/14   Charolette Forward, MD  Omega-3 Fatty Acids (FISH OIL) 1000 MG CAPS Take 1 capsule by mouth See admin instructions. Take 8 tables a day to lower triglycerides per list    Historical Provider, MD  oxyCODONE-acetaminophen (PERCOCET/ROXICET) 5-325 MG per tablet Take 1 tablet by mouth See admin instructions. Take eight tablets daily as needed for pain per family and list. For bad  Back pain    Historical Provider, MD  terazosin (HYTRIN) 2 MG capsule Take 4 mg by mouth every evening.    Historical Provider, MD  tiotropium (SPIRIVA) 18 MCG inhalation capsule Place 18 mcg into inhaler and inhale at bedtime.     Historical Provider, MD  vitamin B-12 (CYANOCOBALAMIN) 1000 MCG tablet Take 1,000 mcg by mouth daily.    Historical Provider, MD   BP 168/80 mmHg  Pulse 82  Resp 18  Ht 6' (1.829 m)  Wt 317 lb (143.79 kg)  BMI 42.98 kg/m2  SpO2 97%   Physical Exam  Constitutional: He is oriented to person, place, and time. He appears well-developed and well-nourished. No distress.  HENT:  Head: Normocephalic  and atraumatic.  Eyes: Conjunctivae and EOM are normal.  Neck: Neck supple. No JVD present. No tracheal deviation present.  Cardiovascular: Regular rhythm.  Tachycardia present.  Exam reveals no gallop.   Pulmonary/Chest: Tachypnea noted. No respiratory distress. He has rales.  Musculoskeletal: Normal range of motion. He exhibits edema.  2+ pitting edema to mid shin Erythema to anterior shins on both signs consistent with venous stasis changes   Neurological: He is alert and oriented to person, place, and time.  Skin: Skin is warm and dry. There is erythema.  Psychiatric: He has a normal mood and affect. His behavior is normal.  Nursing note and vitals reviewed.   ED Course  Procedures (  including critical care time)  CRITICAL CARE Performed by: Merrily Pew Total critical care time: 35 minutes Critical care time was exclusive of separately billable procedures and treating other patients. Critical care was necessary to treat or prevent imminent or life-threatening deterioration. Critical care was time spent personally by me on the following activities: development of treatment plan with patient and/or surrogate as well as nursing, discussions with consultants, evaluation of patient's response to treatment, examination of patient, obtaining history from patient or surrogate, ordering and performing treatments and interventions, ordering and review of laboratory studies, ordering and review of radiographic studies, pulse oximetry and re-evaluation of patient's condition.   DIAGNOSTIC STUDIES: Oxygen Saturation is 83% on RA, low by my interpretation.    COORDINATION OF CARE: 11:22 AM-Discussed treatment plan which includes CXR with pt at bedside and pt agreed to plan.   Labs Review Labs Reviewed  COMPREHENSIVE METABOLIC PANEL - Abnormal; Notable for the following:    Chloride 95 (*)    Glucose, Bld 271 (*)    Calcium 8.6 (*)    Albumin 3.2 (*)    All other components within normal  limits  TROPONIN I - Abnormal; Notable for the following:    Troponin I 0.20 (*)    All other components within normal limits  CBC WITH DIFFERENTIAL/PLATELET  BRAIN NATRIURETIC PEPTIDE    Imaging Review Dg Chest 2 View  02/24/2016  CLINICAL DATA:  Evaluate for pulmonary edema EXAM: CHEST  2 VIEW COMPARISON:  08/26/2014 FINDINGS: Chronic hyperinflation. Few, new Kerley lines compatible with mild edema. Chronic cardiomegaly. Stable aortic and hilar contours. No indication of pneumonia. No acute osseous finding. IMPRESSION: Mild interstitial pulmonary edema. Electronically Signed   By: Monte Fantasia M.D.   On: 02/24/2016 13:17   I have personally reviewed and evaluated these images and lab results as part of my medical decision-making.   EKG Interpretation   Date/Time:  Tuesday February 24 2016 11:18:45 EDT Ventricular Rate:  99 PR Interval:    QRS Duration: 124 QT Interval:  379 QTC Calculation: 487 R Axis:   95 Text Interpretation:  Sinus rhythm Nonspecific intraventricular conduction  delay Borderline repolarization abnormality Baseline wander in lead(s) II  III aVR aVL aVF V1 V2 V4 Confirmed by [facility] MD, Corene Cornea [phone]) on  02/24/2016 3:12:24 PM      MDM   Final diagnoses:  Acute on chronic congestive heart failure, unspecified congestive heart failure type (HCC)  Hypoxia  NSTEMI (non-ST elevated myocardial infarction) [facility])    65 year old male with a history of ACS here with likely CHF exacerbation. Slightly elevated troponin is likely a leak from the seizure exacerbations well. Had a catheterization a couple years ago by Dr. Terrence Dupont which showed multivessel disease. Gave aspirin. Also with hypoxia so started on oxygen. Secondary to multiple comorbidities and elevated troponin and dyspnea with oxygen requirement I will admit the patient to hospital for diuresis and further workup.   I personally performed the services described in this documentation, which was scribed in my  presence. The recorded information has been reviewed and is accurate.  Merrily Pew, MD 02/24/16 [phone]

## 2016-02-25 ENCOUNTER — Encounter (HOSPITAL_COMMUNITY): Payer: Self-pay | Admitting: Physician Assistant

## 2016-02-25 DIAGNOSIS — I509 Heart failure, unspecified: Secondary | ICD-10-CM

## 2016-02-25 DIAGNOSIS — I10 Essential (primary) hypertension: Secondary | ICD-10-CM

## 2016-02-25 LAB — BASIC METABOLIC PANEL
ANION GAP: 10 (ref 5–15)
BUN: 13 mg/dL (ref 6–20)
CALCIUM: 8.3 mg/dL — AB (ref 8.9–10.3)
CO2: 34 mmol/L — ABNORMAL HIGH (ref 22–32)
Chloride: 95 mmol/L — ABNORMAL LOW (ref 101–111)
Creatinine, Ser: 0.85 mg/dL (ref 0.61–1.24)
GFR calc Af Amer: >60 mL/min (ref >60)
GLUCOSE: 170 mg/dL — AB (ref 65–99)
POTASSIUM: 3.6 mmol/L (ref 3.5–5.1)
Sodium: 139 mmol/L (ref 135–145)

## 2016-02-25 LAB — URINALYSIS, ROUTINE W REFLEX MICROSCOPIC
BILIRUBIN URINE: NEGATIVE
GLUCOSE, UA: 100 mg/dL — AB
Ketones, ur: NEGATIVE mg/dL
Leukocytes, UA: NEGATIVE
Nitrite: NEGATIVE
Protein, ur: 100 mg/dL — AB
SPECIFIC GRAVITY, URINE: 1.015 (ref 1.005–1.030)
pH: 6 (ref 5.0–8.0)

## 2016-02-25 LAB — URINE MICROSCOPIC-ADD ON

## 2016-02-25 LAB — GLUCOSE, CAPILLARY
GLUCOSE-CAPILLARY: 168 mg/dL — AB (ref 65–99)
GLUCOSE-CAPILLARY: 154 mg/dL — AB (ref 65–99)
Glucose-Capillary: 213 mg/dL — ABNORMAL HIGH (ref 65–99)
Glucose-Capillary: 220 mg/dL — ABNORMAL HIGH (ref 65–99)

## 2016-02-25 LAB — TROPONIN I
TROPONIN I: 0.17 ng/mL — AB (ref <0.03)
TROPONIN I: 0.24 ng/mL — AB (ref <0.03)

## 2016-02-25 MED ORDER — POTASSIUM CHLORIDE CRYS ER 20 MEQ PO TBCR
20.0000 meq | EXTENDED_RELEASE_TABLET | Freq: Two times a day (BID) | ORAL | Status: DC
  Administered 2016-02-25 – 2016-02-27 (×5): 20 meq via ORAL
  Filled 2016-02-25 (×4): qty 1

## 2016-02-25 NOTE — Consult Note (Addendum)
Cardiology Consultation Note    Patient ID: Jose [name], MRN: [id number], DOB/AGE: 03/20/1951 65 y.o. Admit date: 02/24/2016   Date of Consult: 02/25/2016 Primary Physician: No PCP Per Patient Primary Cardiologist: Previously Dr. Terrence Dupont  Chief Complaint: SOB Reason for Consultation: CHF Requesting MD: Dr. Roderic Palau  HPI: Jose [name] is a 65 y.o. male with history of morbid obesity, CAD (prior silent inferior MI, NSTEMI 07/2014 with chronically occluded RCA s/p unsuccessful PCI with otherwise nonobstructive residual disease), COPD, DM c/b neuropathy, HTN, HLD, OSA who presented to [facility] with SOB and hypoxia. Cath by Dr. Terrence Dupont 07/2014 revealed EF 50-55% with inferobasal wall severe hypokinesia, 40-50% ostial stenosis and 20-30% proximal stenosis, mild dz in D1/D2/Cx/OM2, 100% RCA filling by bridging collaterals). He recently saw his PCP for LEE who ordered 2D echo at the [place] which was pending for this week. The edema has been present with skin thickening for about 6 months. However, over the last 3 days he's become increasingly SOB with even minimal activity. No chest pain. He does not follow his weight at home. His diet consists of frequent fast food and pizza. He does not drink excessive amount of fluid. He is very sedentary, states "I've never weighed this much in my life but all I do is lay around on the couch." He quit smoking 7 years ago after 45 years of use. He occasionally checks his pulse ox at home and noticed it was running in the 80's. This, combined with his severe DOE, prompted him to see care in the ER. Labwork notable for BNP of 75, normal CBC, Cr 0.82, troponin 0.20->0.24->0.17, albumin 3.2. CXR with mild interstitial pulm edema. BP has been poorly controlled with ranges [id number] systolic. He received 80mg  IV Lasix yesterday afternoon and was started on 60mg  IV BID yesterday evening. He's noticed some improvement in dyspnea already. Weight not recorded yet today, but he is net -2.2  L.  Past Medical History  Diagnosis Date  . Hypertension   . Sleep apnea   . COPD (chronic obstructive pulmonary disease) ([place])   . Diabetes mellitus without complication ([place])   . Neuropathy [facility])       Surgical History:  Past Surgical History  Procedure Laterality Date  . Back surgery    . Left heart catheterization with coronary angiogram N/A 08/28/2014    Procedure: LEFT HEART CATHETERIZATION WITH CORONARY ANGIOGRAM;  Surgeon: Clent Demark, MD;  Location: [facility] CATH LAB;  Service: Cardiovascular;  Laterality: N/A;     Home Meds: Prior to Admission medications   Medication Sig Start Date End Date Taking? Authorizing Provider  albuterol (PROVENTIL HFA;VENTOLIN HFA) 108 (90 BASE) MCG/ACT inhaler Inhale 2 puffs into the lungs every 6 (six) hours as needed for wheezing or shortness of breath.   Yes Historical Provider, MD  HYDROcodone-acetaminophen (NORCO/VICODIN) 5-325 MG tablet Take 1 tablet by mouth every 4 (four) hours as needed for moderate pain (Takes 2 tabs every 4 hours).   Yes Historical Provider, MD  insulin aspart (NOVOLOG) 100 UNIT/ML injection Inject 30 Units into the skin 3 (three) times daily before meals.    Yes Historical Provider, MD  insulin glargine (LANTUS) 100 UNIT/ML injection Inject 30-60 Units into the skin See admin instructions. 30 units in the AM and 60 units in the PM   Yes Historical Provider, MD  metoprolol (LOPRESSOR) 50 MG tablet Take 50 mg by mouth 2 (two) times daily.   Yes Historical Provider, MD  Multiple Vitamins-Minerals (ICAPS AREDS  2 PO) Take 1 capsule by mouth 2 (two) times daily.    Yes Historical Provider, MD  oxyCODONE-acetaminophen (PERCOCET/ROXICET) 5-325 MG per tablet Take 2 tablets by mouth every 4 (four) hours. Take eight tablets daily as needed for pain per family and list. For bad  Back pain   Yes Historical Provider, MD  sertraline (ZOLOFT) 100 MG tablet Take 100 mg by mouth daily.   Yes Historical Provider, MD  tiotropium (SPIRIVA) 18  MCG inhalation capsule Place 18 mcg into inhaler and inhale at bedtime.    Yes Historical Provider, MD  aspirin 81 MG tablet Take 81 mg by mouth daily.    Historical Provider, MD  atorvastatin (LIPITOR) 80 MG tablet Take 1 tablet (80 mg total) by mouth daily at 6 PM. 08/29/14   Charolette Forward, MD  clopidogrel (PLAVIX) 75 MG tablet Take 1 tablet (75 mg total) by mouth daily with breakfast. 08/29/14   Charolette Forward, MD  docusate sodium (COLACE) 100 MG capsule Take 100-200 mg by mouth daily. Take one tablet when constipation starts if continues take another one.    Historical Provider, MD  ergocalciferol (VITAMIN D2) 50000 UNITS capsule Take 50,000 Units by mouth See admin instructions. Take one twice a week on wednesdays and saturdays    Historical Provider, MD  ferrous sulfate 325 (65 FE) MG tablet Take 325 mg by mouth 3 (three) times daily with meals.    Historical Provider, MD  furosemide (LASIX) 40 MG tablet Take 40 mg by mouth 2 (two) times daily.    Historical Provider, MD  glipiZIDE (GLUCOTROL) 5 MG tablet Take 5 mg by mouth 4 (four) times daily. Take 4 times a day per list    Historical Provider, MD  lisinopril (PRINIVIL,ZESTRIL) 40 MG tablet Take 40 mg by mouth daily.    Historical Provider, MD  metFORMIN (GLUCOPHAGE) 1000 MG tablet Take 1 tablet (1,000 mg total) by mouth 2 (two) times daily with a meal. 08/30/14   Charolette Forward, MD  nitroGLYCERIN (NITROSTAT) 0.4 MG SL tablet Place 1 tablet (0.4 mg total) under the tongue every 5 (five) minutes x 3 doses as needed for chest pain. 08/29/14   Charolette Forward, MD  Omega-3 Fatty Acids (FISH OIL) 1000 MG CAPS Take 1 capsule by mouth See admin instructions. Take 8 tables a day to lower triglycerides per list    Historical Provider, MD  terazosin (HYTRIN) 2 MG capsule Take 4 mg by mouth every evening.    Historical Provider, MD  vitamin B-12 (CYANOCOBALAMIN) 1000 MCG tablet Take 1,000 mcg by mouth daily.    Historical Provider, MD    Inpatient  Medications:  . arformoterol  15 mcg Nebulization BID  . aspirin  81 mg Oral Daily  . atorvastatin  80 mg Oral q1800  . clopidogrel  75 mg Oral Q breakfast  . docusate sodium  100-200 mg Oral Daily  . enoxaparin (LOVENOX) injection  70 mg Subcutaneous Q24H  . furosemide  60 mg Intravenous BID  . glipiZIDE  5 mg Oral TID AC & HS  . insulin aspart  0-15 Units Subcutaneous TID WC  . insulin aspart  0-5 Units Subcutaneous QHS  . insulin aspart  25 Units Subcutaneous TID AC  . insulin glargine  25 Units Subcutaneous Daily   And  . insulin glargine  54 Units Subcutaneous QHS  . lisinopril  40 mg Oral Daily  . metoprolol  50 mg Oral BID  . sertraline  100 mg Oral Daily  .  sodium chloride flush  3 mL Intravenous Q12H  . terazosin  4 mg Oral QPM  . tiotropium  18 mcg Inhalation QHS  . vitamin B-12  1,000 mcg Oral Daily      Allergies: No Known Allergies  Social History   Social History  . Marital Status: Unknown    Spouse Name: N/A  . Number of Children: N/A  . Years of Education: N/A   Occupational History  . Not on file.   Social History Main Topics  . Smoking status: Former Smoker -- 3.00 packs/day for 45 years    Types: Cigarettes  . Smokeless tobacco: Not on file  . Alcohol Use: No  . Drug Use: Not on file  . Sexual Activity: Not on file   Other Topics Concern  . Not on file   Social History Narrative     History reviewed. No pertinent family history.   Review of Systems: No syncope or bleeding All other systems reviewed and are otherwise negative except as noted above.  Labs:  Recent Labs  02/24/16 1120 02/24/16 1848 02/25/16 0034 02/25/16 0619  TROPONINI 0.20* 0.20* 0.24* 0.17*   Lab Results  Component Value Date   WBC 7.7 02/24/2016   HGB 14.8 02/24/2016   HCT 45.6 02/24/2016   MCV 90.8 02/24/2016   PLT 188 02/24/2016    Recent Labs Lab 02/24/16 1120 02/25/16 0616  NA 136 139  K 3.7 3.6  CL 95* 95*  CO2 32 34*  BUN 15 13  CREATININE  0.82 0.85  CALCIUM 8.6* 8.3*  PROT 7.0  --   BILITOT 0.5  --   ALKPHOS 53  --   ALT 19  --   AST 22  --   GLUCOSE 271* 170*   Lab Results  Component Value Date   CHOL 296* 08/28/2014   HDL 32* 08/28/2014   LDLCALC UNABLE TO CALCULATE IF TRIGLYCERIDE OVER 400 mg/dL 08/28/2014   TRIG 981* 08/28/2014   No results found for: DDIMER  Radiology/Studies:  Dg Chest 2 View  02/24/2016  CLINICAL DATA:  Evaluate for pulmonary edema EXAM: CHEST  2 VIEW COMPARISON:  08/26/2014 FINDINGS: Chronic hyperinflation. Few, new Kerley lines compatible with mild edema. Chronic cardiomegaly. Stable aortic and hilar contours. No indication of pneumonia. No acute osseous finding. IMPRESSION: Mild interstitial pulmonary edema. Electronically Signed   By: Monte Fantasia M.D.   On: 02/24/2016 13:17    Wt Readings from Last 3 Encounters:  02/24/16 311 lb 6.8 oz (141.26 kg)  08/29/14 295 lb 6.7 oz (134 kg)    EKG: 1) NSR, NSIVCD, difficult to interpret ST segments due to baseline wander 2) NSR 85bpm rightward access, otherwise no acute changes  Physical Exam: Blood pressure 174/72, pulse 73, temperature 97.9 F (36.6 C), temperature source Oral, resp. rate 18, height 6' (1.829 m), weight 311 lb 6.8 oz (141.26 kg), SpO2 98 %. Body mass index is 42.23 kg/(m^2). General: Well developed, well nourished obese WM, in no acute distress. Head: Normocephalic, atraumatic, sclera non-icteric, no xanthomas, nares are without discharge.  Neck: Negative for carotid bruits. JVD not elevated. Lungs: Diminished throughout bilaterally to auscultation without wheezes, rales, or rhonchi. Breathing is unlabored. Heart: RRR with S1 S2. No murmurs, rubs, or gallops appreciated. Abdomen: Soft, non-tender, non-distended with normoactive bowel sounds. No hepatomegaly. No rebound/guarding. No obvious abdominal masses. Msk:  Strength and tone appear normal for age. Extremities: No clubbing or cyanosis. 2+ BLE edema superimposed on  baseline large leg habitus with  chronic skin thickening.  Distal pedal pulses in tact, equal bilaterally Neuro: Alert and oriented X 3. No facial asymmetry. No focal deficit. Moves all extremities spontaneously. Psych:  Responds to questions appropriately with a normal affect.     Assessment and Plan   1. Acute hypoxia with suspected acute CHF exacerbation in setting of high salt diet  - suspect BNP falsely low in setting of morbid obesity - would continue IV Lasix through today and reassess in AM and add KCL 62meq BID - await echocardiogram (previous normal LV function) - discussed need for daily weights, low sodium diet, fluid restriction - ? check d-dimer to exclude PE  2. Elevated troponin in the setting of known CAD as above - suspect demand ischemia superimposed on underlying CAD - has been on ASA/Plavix since occ RCA noted in 2015 - will review need for IV heparin with MD (no chest pain) and plan for further workup with MD. Cath note in 07/2014 raised consideration of CTO PCI of RCA - continue ASA, Plavix, BB, statin and check lipids in AM - 3. Accelerated HTN - will ask nurse to update BP now that he has diuresed more this AM - check UA to assess for nephrotic state given DM and decreased albumin - will review med regimen with MD since most recent LVEF not known  4. COPD - no wheezes or rhonchi on exam - if dyspnea persists despite diuresis, can consider changing metoprolol to bisoprolol  5. Morbid obesity BMI 42.2 / OSA, compliant with CPAP - reviewed importance of longterm weight loss  Signed, Charlie Pitter PA-C 02/25/2016, 12:15 PM Pager: [phone]  Patient seen and discussed with PA Dunn, I agree with her documentation above. 65 yo male with ihstory of COPD, DM2, HTN, HL, OSA, obesity, CAD with prior inferior MI with RCA CTO with failed intervention admitted with SOB. He reports several week history of progression SOB, DOE, and LE edema. DOE now just walking from his  room to the bathroom. Reports high sodium intake at home.    ER vitals: 198/78 p 103 83% RA BNP 75, K 3.7, Cr 0.82, Hgb 14.8, Plt 188, trop 0.20-->0.24-->0.17 EKG SR no ischemic changes CXR mild pulmonary edema   Acute HF, evidence of severe volume overload by exam. He rememebers weights around 296 a few months ago, he is admitted at 317 lbs. Fairly mild BNP in setting obesity. Echo pending to help further evaluate etiology. He is on IV lasix 60mg  bid, mild uptrend in Cr and BUN but still within normal limits, negative 2.2 liters since admission. Continue IV diuretics. Significant HTN, will adjust meds once echo is back as he may require a sytolic HF regimen. Mild to moderate troponin elevation in setting of CHF and HTN, he has known CAD. We will f/u echo results,, his trop is trending down and he has not had any chest pain. Pending echo results decide on possible invasive testing.    Zandra Abts MD

## 2016-02-25 NOTE — Progress Notes (Signed)
PROGRESS NOTE    Jose [name]  [id number] DOB: 1951-04-18 DOA: 02/24/2016 PCP: No PCP Per Patient Outpatient Specialists:    Brief Narrative:  45 yom presented with complaints of progressive shortness of breath. While being evaluated in the ED, he was noted to have volume overload with significant pedal edema. He was started on IV lasix and referred for admission. ECHO has been completed with results pending.    Assessment & Plan:   Active Problems:   CHF exacerbation (HCC)   COPD (chronic obstructive pulmonary disease) (HCC)   Acute respiratory failure with hypoxia (HCC)   HTN (hypertension)   Diabetes (HCC)   Morbid obesity (HCC)   OSA (obstructive sleep apnea)   Elevated troponin   1. Acute CHF exacerbation. Continue intravenous Lasix. Monitor intake and output. Check echocardiogram to assess LV function. He is already on a beta blocker and ACE inhibitor. Cardiology consulted, input appreciated. Pending echo results, may need further invasive workup.  2. Acute respiratory failure with hypoxia. Likely related to CHF exacerbation. Try to wean off oxygen as tolerated.  3. Elevated troponin. Likely demand ischemia in the setting of CHF exacerbation. Continue to monitor troponin. He does not have any chest pain.  4. History of coronary artery disease. Last cardiac catheterization in 2015. Found to have an occluded RCA at that time that was not amenable to intervention. He is continued on aspirin and Plavix.  5. Diabetes. Blood sugars appear stable. Continue home insulin regimen with Lantus and NovoLog. Supplemental sliding scale insulin.  6. Obstructive sleep apnea. Continue CPAP  7. Hypertension. Continue outpatient regimen  8. COPD . No evidence of wheezing or exacerbation. Continue outpatient bronchodilators.  9. Morbid obesity. Discussed importance of diet and exercise.  DVT prophylaxis: Lovenox Code Status: Full Family Communication: Discussed with patient and  wife at bedside. Disposition Plan: Discharge home once improved   Consultants:   Cardiology  Procedures:   ECHO results pending  Antimicrobials:   none    Subjective: Feels improved today. Is breathing better, though he does not believe that LLE edema has improved much. Denies any chest pain. He has slept well.  Objective: Filed Vitals:   02/24/16 1922 02/24/16 2121 02/24/16 2255 02/25/16 0543  BP:  184/78  174/72  Pulse:  84 85 73  Temp:  98.6 F (37 C)  97.9 F (36.6 C)  TempSrc:  Oral  Oral  Resp:  20 17 18   Height:      Weight:      SpO2: 95% 96% 93% 94%    Intake/Output Summary (Last 24 hours) at 02/25/16 0739 Last data filed at 02/24/16 2100  Gross per 24 hour  Intake    240 ml  Output   1900 ml  Net  -1660 ml   Filed Weights   02/24/16 1125 02/24/16 1559  Weight: 143.79 kg (317 lb) 141.26 kg (311 lb 6.8 oz)    Examination:  General exam: Appears calm and comfortable  Respiratory system: Crackles at bases. Respiratory effort normal. Cardiovascular system: S1 & S2 heard, RRR. No JVD, murmurs, rubs, gallops or clicks. 2+ pedal edema. Gastrointestinal system: Abdomen is nondistended, soft and nontender. No organomegaly or masses felt. Normal bowel sounds heard. Central nervous system: Alert and oriented. No focal neurological deficits. Extremities: Symmetric 5 x 5 power. Skin: No rashes, lesions or ulcers Psychiatry: Judgement and insight appear normal. Mood & affect appropriate.     Data Reviewed: I have personally reviewed following labs and imaging studies  CBC:  Recent Labs Lab 02/24/16 1120  WBC 7.7  NEUTROABS 5.7  HGB 14.8  HCT 45.6  MCV 90.8  PLT 0000000   Basic Metabolic Panel:  Recent Labs Lab 02/24/16 1120 02/25/16 0616  NA 136 139  K 3.7 3.6  CL 95* 95*  CO2 32 34*  GLUCOSE 271* 170*  BUN 15 13  CREATININE 0.82 0.85  CALCIUM 8.6* 8.3*   GFR: Estimated Creatinine Clearance: 126.3 mL/min (by C-G formula based on Cr of  0.85). Liver Function Tests:  Recent Labs Lab 02/24/16 1120  AST 22  ALT 19  ALKPHOS 53  BILITOT 0.5  PROT 7.0  ALBUMIN 3.2*   No results for input(s): LIPASE, AMYLASE in the last 168 hours. No results for input(s): AMMONIA in the last 168 hours. Coagulation Profile: No results for input(s): INR, PROTIME in the last 168 hours. Cardiac Enzymes:  Recent Labs Lab 02/24/16 1120 02/24/16 1848 02/25/16 0034 02/25/16 0619  TROPONINI 0.20* 0.20* 0.24* 0.17*   BNP (last 3 results) No results for input(s): PROBNP in the last 8760 hours. HbA1C: No results for input(s): HGBA1C in the last 72 hours. CBG:  Recent Labs Lab 02/24/16 1607 02/24/16 2118  GLUCAP 173* 213*   Lipid Profile: No results for input(s): CHOL, HDL, LDLCALC, TRIG, CHOLHDL, LDLDIRECT in the last 72 hours. Thyroid Function Tests: No results for input(s): TSH, T4TOTAL, FREET4, T3FREE, THYROIDAB in the last 72 hours. Anemia Panel: No results for input(s): VITAMINB12, FOLATE, FERRITIN, TIBC, IRON, RETICCTPCT in the last 72 hours. Urine analysis: No results found for: COLORURINE, APPEARANCEUR, LABSPEC, PHURINE, GLUCOSEU, HGBUR, BILIRUBINUR, KETONESUR, PROTEINUR, UROBILINOGEN, NITRITE, LEUKOCYTESUR Sepsis Labs: @LABRCNTIP (procalcitonin:4,lacticidven:4)  )No results found for this or any previous visit (from the past 240 hour(s)).       Radiology Studies: Dg Chest 2 View  02/24/2016  CLINICAL DATA:  Evaluate for pulmonary edema EXAM: CHEST  2 VIEW COMPARISON:  08/26/2014 FINDINGS: Chronic hyperinflation. Few, new Kerley lines compatible with mild edema. Chronic cardiomegaly. Stable aortic and hilar contours. No indication of pneumonia. No acute osseous finding. IMPRESSION: Mild interstitial pulmonary edema. Electronically Signed   By: Monte Fantasia M.D.   On: 02/24/2016 13:17        Scheduled Meds: . arformoterol  15 mcg Nebulization BID  . aspirin  81 mg Oral Daily  . atorvastatin  80 mg Oral q1800   . clopidogrel  75 mg Oral Q breakfast  . docusate sodium  100-200 mg Oral Daily  . enoxaparin (LOVENOX) injection  70 mg Subcutaneous Q24H  . furosemide  60 mg Intravenous BID  . glipiZIDE  5 mg Oral TID AC & HS  . insulin aspart  0-15 Units Subcutaneous TID WC  . insulin aspart  0-5 Units Subcutaneous QHS  . insulin aspart  25 Units Subcutaneous TID AC  . insulin glargine  25 Units Subcutaneous Daily   And  . insulin glargine  54 Units Subcutaneous QHS  . lisinopril  40 mg Oral Daily  . metoprolol  50 mg Oral BID  . sertraline  100 mg Oral Daily  . sodium chloride flush  3 mL Intravenous Q12H  . terazosin  4 mg Oral QPM  . tiotropium  18 mcg Inhalation QHS  . vitamin B-12  1,000 mcg Oral Daily   Continuous Infusions:    LOS: 1 day    Time spent: 25 minutes  Kathie Dike,  MD [facility] Pager [phone]  If 7PM-7AM, please contact night-coverage [email or web address] Password [facility] 02/25/2016, 7:39  AM    

## 2016-02-25 NOTE — Progress Notes (Signed)
Troponin up from 0.20 to 0.24.  On call MD notified via text page.  Patient in no distress.  Will continue to monitor.

## 2016-02-25 NOTE — Care Management Note (Signed)
Case Management Note  Patient Details  Name: LAWARENCE PIETRANTONIO MRN: [id number] Date of Birth: 1951-04-13  Subjective/Objective:                  Pt admitted with CHF. Pt's wife at the bedside. Pt is from home, lives with wife and is ind with ADL's. Pt uses walker for mobility. Pt has CPAP he uses at night but no home O2. Pt is not active with [facility] services. Pt's PCP is at the [facility] and pt's wife drives him to appointments. Pt states he does not weigh himself daily but does have a scale, educated pt on importance of daily weights and when to contact his PCP for med adjustments. Verbalizes understanding but will need continued educations. Hope, at [facility] notified of admission and faxed H&P. Pt understands hospitalization will be covered by medicare. Pt plans to return home with self care. CM will provide updates to [facility] as necessary. Pt will need home O2 assessment prior to DC.   Action/Plan: Anticipate pt will return home with self care. Will need home O2 assessment.   Expected Discharge Date:  02/26/16               Expected Discharge Plan:  Home/Self Care  In-House Referral:  NA  Discharge planning Services  CM Consult  Post Acute Care Choice:  NA Choice offered to:  NA  DME Arranged:    DME Agency:     HH Arranged:    HH Agency:     Status of Service:  In process, will continue to follow  If discussed at Long Length of Stay Meetings, dates discussed:    Additional Comments:  Sherald Barge, RN 02/25/2016, 11:30 AM

## 2016-02-25 NOTE — Care Management Important Message (Signed)
Important Message  Patient Details  Name: Jose [name] MRN: [id number] Date of Birth: August 11, 1951   Medicare Important Message Given:  Yes    Sherald Barge, RN 02/25/2016, 11:39 AM

## 2016-02-26 ENCOUNTER — Inpatient Hospital Stay (HOSPITAL_COMMUNITY): Payer: Medicare Other

## 2016-02-26 DIAGNOSIS — R7989 Other specified abnormal findings of blood chemistry: Secondary | ICD-10-CM

## 2016-02-26 DIAGNOSIS — I509 Heart failure, unspecified: Secondary | ICD-10-CM

## 2016-02-26 LAB — ECHOCARDIOGRAM COMPLETE
E decel time: 238 msec
E/e' ratio: 16.44
FS: 35 % (ref 28–44)
HEIGHTINCHES: 72 in
IVS/LV PW RATIO, ED: 0.99
LA vol index: 19.8 mL/m2
LADIAMINDEX: 1.8 cm/m2
LASIZE: 47 mm
LAVOL: 51.7 mL
LAVOLA4C: 54.2 mL
LEFT ATRIUM END SYS DIAM: 47 mm
LV PW d: 15.9 mm — AB (ref 0.6–1.1)
LV e' LATERAL: 5.76 cm/s
LVEEAVG: 16.44
LVEEMED: 16.44
LVOT area: 3.14 cm2
LVOT diameter: 20 mm
MV Dec: 238
MV pk E vel: 94.7 m/s
MVPG: 4 mmHg
MVPKAVEL: 107 m/s
TAPSE: 26.5 mm
TDI e' lateral: 5.76
TDI e' medial: 5.59
WEIGHTICAEL: 5142.4 [oz_av]

## 2016-02-26 LAB — GLUCOSE, CAPILLARY
GLUCOSE-CAPILLARY: 239 mg/dL — AB (ref 65–99)
GLUCOSE-CAPILLARY: 185 mg/dL — AB (ref 65–99)
GLUCOSE-CAPILLARY: 113 mg/dL — AB (ref 65–99)
Glucose-Capillary: 227 mg/dL — ABNORMAL HIGH (ref 65–99)

## 2016-02-26 LAB — BASIC METABOLIC PANEL
Anion gap: 6 (ref 5–15)
BUN: 14 mg/dL (ref 6–20)
CALCIUM: 8.4 mg/dL — AB (ref 8.9–10.3)
CHLORIDE: 95 mmol/L — AB (ref 101–111)
CO2: 37 mmol/L — ABNORMAL HIGH (ref 22–32)
CREATININE: 0.81 mg/dL (ref 0.61–1.24)
GFR calc non Af Amer: >60 mL/min (ref >60)
Glucose, Bld: 212 mg/dL — ABNORMAL HIGH (ref 65–99)
Potassium: 3.8 mmol/L (ref 3.5–5.1)
SODIUM: 138 mmol/L (ref 135–145)

## 2016-02-26 LAB — LIPID PANEL
Cholesterol: 113 mg/dL (ref 0–200)
HDL: 26 mg/dL — ABNORMAL LOW (ref >40)
LDL Cholesterol: 34 mg/dL (ref 0–99)
Total CHOL/HDL Ratio: 4.3 RATIO
Triglycerides: 265 mg/dL — ABNORMAL HIGH (ref <150)
VLDL: 53 mg/dL — AB (ref 0–40)

## 2016-02-26 MED ORDER — PERFLUTREN LIPID MICROSPHERE
1.0000 mL | INTRAVENOUS | Status: AC | PRN
  Administered 2016-02-26: 4 mL via INTRAVENOUS
  Filled 2016-02-26: qty 10

## 2016-02-26 MED ORDER — AMLODIPINE BESYLATE 5 MG PO TABS
2.5000 mg | ORAL_TABLET | Freq: Every day | ORAL | Status: DC
Start: 2016-02-26 — End: 2016-02-27
  Administered 2016-02-26 – 2016-02-27 (×2): 2.5 mg via ORAL
  Filled 2016-02-26 (×2): qty 1

## 2016-02-26 MED ORDER — FUROSEMIDE 10 MG/ML IJ SOLN
80.0000 mg | Freq: Two times a day (BID) | INTRAMUSCULAR | Status: DC
  Administered 2016-02-26 – 2016-02-27 (×2): 80 mg via INTRAVENOUS
  Filled 2016-02-26 (×2): qty 8

## 2016-02-26 NOTE — Progress Notes (Signed)
Inpatient Diabetes Program Recommendations  AACE/ADA: New Consensus Statement on Inpatient Glycemic Control (2015)  Target Ranges:  Prepandial:   less than 140 mg/dL      Peak postprandial:   less than 180 mg/dL (1-2 hours)      Critically ill patients:  140 - 180 mg/dL  Results for Jose [name], Jose [name] (MRN [id number]) as of 02/26/2016 10:03  Ref. Range 02/25/2016 07:24 02/25/2016 11:31 02/25/2016 16:19 02/25/2016 21:26 02/26/2016 07:30  Glucose-Capillary Latest Ref Range: 65-99 mg/dL 168 (H) 213 (H) 154 (H) 220 (H) 227 (H)   Results for Jose [name], Jose [name] (MRN [id number]) as of 02/26/2016 10:03  Ref. Range 08/27/2014 22:02  Hemoglobin A1C Latest Ref Range: <5.7 % 10.0 (H)   Review of Glycemic Control  Current orders for Inpatient glycemic control: Lantus 25 units QAM, Lantus 54 units QHS, Novolog 25 units TID with meals for meal coverage, Novolog 0-15 units TID with meals, Novolog 0-5 units QHS, Glipizide 5 mg ACHS  Inpatient Diabetes Program Recommendations:  Insulin - Basal: Please consider increasing Lantus to 30 units QAM and 58 units QHS. HgbA1C: Please add an A1C to blood in lab to evaluate glycemic control over the past 2-3 months.  Thanks, Barnie Alderman, RN, MSN, CDE Diabetes Coordinator Inpatient Diabetes Program [phone] (Team Pager from [place] to [place]) [phone] ([facility] office) [phone] [facility] office) [phone] [facility] office)

## 2016-02-26 NOTE — Progress Notes (Signed)
*  PRELIMINARY RESULTS* Echocardiogram 2D Echocardiogram has been performed using [employer].  Samuel Germany 02/26/2016, 12:45 PM

## 2016-02-26 NOTE — Progress Notes (Signed)
Subjective: Breathign is OK  No CP   Objective: Filed Vitals:   02/25/16 1933 02/25/16 2100 02/26/16 0500 02/26/16 0800  BP:  155/60 148/60 151/51  Pulse:  76 67 82  Temp:  98.2 F (36.8 C) 98 F (36.7 C)   TempSrc:  Oral Oral   Resp:  16 16 16   Height:      Weight:   321 lb 6.4 oz (145.786 kg)   SpO2: 93% 97% 95% 96%   Weight change: 4 lb 8 oz (2.041 kg)  Intake/Output Summary (Last 24 hours) at 02/26/16 [id number] Last data filed at 02/26/16 0500  Gross per 24 hour  Intake    480 ml  Output   3100 ml  Net  -2620 ml    General:Morbidly obese 65 yo in NAD  Neck:  JVP is diffi to assess  Neck is full Heart: Regular rate and rhythm, without murmurs, rubs, gallops.  Lungs: Clear to auscultation.  No rales or wheezes. Exemities:  2+ edema.   Neuro: Grossly intact, nonfocal.  Tele:  SR   Lab Results: Results for orders placed or performed during the hospital encounter of 02/24/16 (from the past 24 hour(s))  Glucose, capillary     Status: Abnormal   Collection Time: 02/25/16 11:31 AM  Result Value Ref Range   Glucose-Capillary 213 (H) 65 - 99 mg/dL  Glucose, capillary     Status: Abnormal   Collection Time: 02/25/16  4:19 PM  Result Value Ref Range   Glucose-Capillary 154 (H) 65 - 99 mg/dL  Glucose, capillary     Status: Abnormal   Collection Time: 02/25/16  9:26 PM  Result Value Ref Range   Glucose-Capillary 220 (H) 65 - 99 mg/dL   Comment 1 Notify RN    Comment 2 Document in Chart   Urinalysis, Routine w reflex microscopic (not at [facility])     Status: Abnormal   Collection Time: 02/25/16  9:35 PM  Result Value Ref Range   Color, Urine YELLOW YELLOW   APPearance CLEAR CLEAR   Specific Gravity, Urine 1.015 1.005 - 1.030   pH 6.0 5.0 - 8.0   Glucose, UA 100 (A) NEGATIVE mg/dL   Hgb urine dipstick SMALL (A) NEGATIVE   Bilirubin Urine NEGATIVE NEGATIVE   Ketones, ur NEGATIVE NEGATIVE mg/dL   Protein, ur 100 (A) NEGATIVE mg/dL   Nitrite NEGATIVE NEGATIVE   Leukocytes,  UA NEGATIVE NEGATIVE  Urine microscopic-add on     Status: Abnormal   Collection Time: 02/25/16  9:35 PM  Result Value Ref Range   Squamous Epithelial / LPF 0-5 (A) NONE SEEN   WBC, UA 0-5 0 - 5 WBC/hpf   RBC / HPF 0-5 0 - 5 RBC/hpf   Bacteria, UA RARE (A) NONE SEEN   Casts GRANULAR CAST (A) NEGATIVE  Basic metabolic panel     Status: Abnormal   Collection Time: 02/26/16  5:58 AM  Result Value Ref Range   Sodium 138 135 - 145 mmol/L   Potassium 3.8 3.5 - 5.1 mmol/L   Chloride 95 (L) 101 - 111 mmol/L   CO2 37 (H) 22 - 32 mmol/L   Glucose, Bld 212 (H) 65 - 99 mg/dL   BUN 14 6 - 20 mg/dL   Creatinine, Ser 0.81 0.61 - 1.24 mg/dL   Calcium 8.4 (L) 8.9 - 10.3 mg/dL   GFR calc non Af Amer >60 >60 mL/min   GFR calc Af Amer >60 >60 mL/min   Anion gap 6  5 - 15  Lipid panel     Status: Abnormal   Collection Time: 02/26/16  5:58 AM  Result Value Ref Range   Cholesterol 113 0 - 200 mg/dL   Triglycerides 265 (H) <150 mg/dL   HDL 26 (L) >40 mg/dL   Total CHOL/HDL Ratio 4.3 RATIO   VLDL 53 (H) 0 - 40 mg/dL   LDL Cholesterol 34 0 - 99 mg/dL  Glucose, capillary     Status: Abnormal   Collection Time: 02/26/16  7:30 AM  Result Value Ref Range   Glucose-Capillary 227 (H) 65 - 99 mg/dL    Studies/Results: No results found.  Medications:Reviewed  @PROBHOSP @  1  CHF  Echo pending   Still with evid of increased volume on exam Would continue IV diuresis.  Increase to 80 bid   Dietary to see re salt intake  2.  HTN  BP is still high  May improve with diuresis  WOuld add amlodipine 2.5 mg for now.  Continue other meds    3  Elev troponin  Minimal  Flat trend  No evid for active ischemi  4.  HL  Continue statin  LDL is good  Needs tighter glu controll    LOS: 2 days   Jose [name] 02/26/2016, 9:39 AM

## 2016-02-26 NOTE — Progress Notes (Signed)
PROGRESS NOTE    Jose [name]  [id number] DOB: Feb 13, 1951 DOA: 02/24/2016 PCP: No PCP Per Patient Outpatient Specialists:    Brief Narrative:  89 yom presented with complaints of progressive shortness of breath. While being evaluated in the ED, he was noted to have volume overload with significant pedal edema. He was started on IV lasix and referred for admission.   Assessment & Plan:   Active Problems:   CHF exacerbation (HCC)   COPD (chronic obstructive pulmonary disease) (HCC)   Acute respiratory failure with hypoxia (HCC)   HTN (hypertension)   Diabetes (HCC)   Morbid obesity (HCC)   OSA (obstructive sleep apnea)   Elevated troponin   Accelerated hypertension   1. Acute CHF exacerbation. Continue intravenous Lasix and monitoring intake and output. He is already on a beta blocker and ACE Follow-up ECHO and cardiology recommendations.IV lasix has been increased today. Urine output has been fair.  2. Acute respiratory failure with hypoxia. Likely related to CHF exacerbation. Continue to wean off oxygen as tolerated.  3. Elevated troponin. Likely demand ischemia in the setting of CHF exacerbation. Continue to monitor troponin. He does not have any chest pain.  4. History of coronary artery disease. Last cardiac catheterization in 2015. Found to have an occluded RCA at that time that was not amenable to intervention. Continued on aspirin and Plavix.  5. Diabetes. Blood sugars appear stable. Continue home insulin regimen with Lantus and NovoLog. Supplemental sliding scale insulin.  6. Obstructive sleep apnea. Continue CPAP  7. Essential Hypertension. Continue outpatient regimen.  8. COPD, stable. No evidence of wheezing or exacerbation. Continue outpatient bronchodilators.  9. Morbid obesity. Discussed importance of diet and exercise.  DVT prophylaxis: Lovenox Code Status: Full Family Communication: Discussed with patient and wife at bedside. Disposition Plan:  Discharge home once improved   Consultants:   Cardiology  Procedures:   ECHO results pending  Antimicrobials:   none    Subjective: Shortness of breath improving. Reports good urine output. Feels edema is slowly getting better  Objective: Filed Vitals:   02/25/16 1931 02/25/16 1933 02/25/16 2100 02/26/16 0500  BP:   155/60 148/60  Pulse:   76 67  Temp:   98.2 F (36.8 C) 98 F (36.7 C)  TempSrc:   Oral Oral  Resp:   16 16  Height:      Weight:    145.786 kg (321 lb 6.4 oz)  SpO2: 93% 93% 97% 95%    Intake/Output Summary (Last 24 hours) at 02/26/16 0657 Last data filed at 02/26/16 0500  Gross per 24 hour  Intake    720 ml  Output   3100 ml  Net  -2380 ml   Filed Weights   02/24/16 1559 02/25/16 1817 02/26/16 0500  Weight: 141.26 kg (311 lb 6.8 oz) 145.831 kg (321 lb 8 oz) 145.786 kg (321 lb 6.4 oz)    Examination:  General exam: Appears calm and comfortable  Respiratory system: CTA B. Respiratory effort normal. Cardiovascular system: S1 & S2 heard, RRR. No JVD, murmurs, rubs, gallops or clicks. 2+ pedal edema. Gastrointestinal system: Abdomen is nondistended, soft and nontender. No organomegaly or masses felt. Normal bowel sounds heard. Central nervous system: Alert and oriented. No focal neurological deficits. Extremities: Symmetric 5 x 5 power. Skin: No rashes, lesions or ulcers Psychiatry: Judgement and insight appear normal. Mood & affect appropriate.     Data Reviewed: I have personally reviewed following labs and imaging studies  CBC:  Recent Labs Lab  02/24/16 1120  WBC 7.7  NEUTROABS 5.7  HGB 14.8  HCT 45.6  MCV 90.8  PLT 0000000   Basic Metabolic Panel:  Recent Labs Lab 02/24/16 1120 02/25/16 0616  NA 136 139  K 3.7 3.6  CL 95* 95*  CO2 32 34*  GLUCOSE 271* 170*  BUN 15 13  CREATININE 0.82 0.85  CALCIUM 8.6* 8.3*   GFR: Estimated Creatinine Clearance: 128.6 mL/min (by C-G formula based on Cr of 0.85). Liver Function  Tests:  Recent Labs Lab 02/24/16 1120  AST 22  ALT 19  ALKPHOS 53  BILITOT 0.5  PROT 7.0  ALBUMIN 3.2*  Cardiac Enzymes:  Recent Labs Lab 02/24/16 1120 02/24/16 1848 02/25/16 0034 02/25/16 0619  TROPONINI 0.20* 0.20* 0.24* 0.17*   BNP (last 3 results) No results for input(s): PROBNP in the last 8760 hours. HbA1C: No results for input(s): HGBA1C in the last 72 hours. CBG:  Recent Labs Lab 02/24/16 2118 02/25/16 0724 02/25/16 1131 02/25/16 1619 02/25/16 2126  GLUCAP 213* 168* 213* 154* 220*   Urine analysis:    Component Value Date/Time   COLORURINE YELLOW 02/25/2016 2135   APPEARANCEUR CLEAR 02/25/2016 2135   LABSPEC 1.015 02/25/2016 2135   PHURINE 6.0 02/25/2016 2135   GLUCOSEU 100* 02/25/2016 2135   HGBUR SMALL* 02/25/2016 2135   BILIRUBINUR NEGATIVE 02/25/2016 2135   [place] 02/25/2016 2135   PROTEINUR 100* 02/25/2016 2135   NITRITE NEGATIVE 02/25/2016 2135   LEUKOCYTESUR NEGATIVE 02/25/2016 2135   Sepsis Labs: @LABRCNTIP (procalcitonin:4,lacticidven:4)  )No results found for this or any previous visit (from the past 240 hour(s)).       Radiology Studies: Dg Chest 2 View  02/24/2016  CLINICAL DATA:  Evaluate for pulmonary edema EXAM: CHEST  2 VIEW COMPARISON:  08/26/2014 FINDINGS: Chronic hyperinflation. Few, new Kerley lines compatible with mild edema. Chronic cardiomegaly. Stable aortic and hilar contours. No indication of pneumonia. No acute osseous finding. IMPRESSION: Mild interstitial pulmonary edema. Electronically Signed   By: Monte Fantasia M.D.   On: 02/24/2016 13:17        Scheduled Meds: . arformoterol  15 mcg Nebulization BID  . aspirin  81 mg Oral Daily  . atorvastatin  80 mg Oral q1800  . clopidogrel  75 mg Oral Q breakfast  . docusate sodium  100-200 mg Oral Daily  . enoxaparin (LOVENOX) injection  70 mg Subcutaneous Q24H  . furosemide  60 mg Intravenous BID  . glipiZIDE  5 mg Oral TID AC & HS  . insulin aspart   0-15 Units Subcutaneous TID WC  . insulin aspart  0-5 Units Subcutaneous QHS  . insulin aspart  25 Units Subcutaneous TID AC  . insulin glargine  25 Units Subcutaneous Daily   And  . insulin glargine  54 Units Subcutaneous QHS  . lisinopril  40 mg Oral Daily  . metoprolol  50 mg Oral BID  . potassium chloride  20 mEq Oral BID  . sertraline  100 mg Oral Daily  . sodium chloride flush  3 mL Intravenous Q12H  . terazosin  4 mg Oral QPM  . tiotropium  18 mcg Inhalation QHS  . vitamin B-12  1,000 mcg Oral Daily   Continuous Infusions:    LOS: 2 days    Time spent: 25 minutes  Kathie Dike,  MD [facility] Pager [phone]  If 7PM-7AM, please contact night-coverage [email or web address] Password [facility] 02/26/2016, 6:57 AM

## 2016-02-26 NOTE — Plan of Care (Signed)
Problem: Food- and Nutrition-Related Knowledge Deficit (NB-1.1) Goal: Nutrition education Formal process to instruct or train a patient/client in a skill or to impart knowledge to help patients/clients voluntarily manage or modify food choices and eating behavior to maintain or improve health. Outcome: Adequate for Discharge Nutrition Education Note  RD consulted for nutrition education regarding new onset CHF and CHO modified diet.   RD provided "Low Sodium Nutrition Therapy", Sodium content of common foods handouts from the Academy of [employer]. Reviewed patient's dietary recall. Provided examples on ways to decrease sodium intake in diet. Discouraged intake of processed foods and use of salt shaker.  Encouraged fresh fruits and vegetables as well as whole grain sources of carbohydrates to maximize fiber intake. We talked about reading labels and his daily sodium goals. Also, focused on limiting portions and sugar-laden beverages in order to decrease his  overall cho (caloric) intake and promote weight loss.   RD discussed why it is important for patient to adhere to diet recommendations, and emphasized the role of fluids, foods to avoid, and importance of weighing self daily. Teach back method used.  Expect good compliance. Patient was very receptive and engaged in education session and expressed desire to "lose some weight" and is hopeful to improve his health.  Body mass index is 43.58 kg/(m^2). Pt meets criteria for obesity class III based on current BMI.  Current diet order is Heart Healthy/CHO modified, patient is consuming approximately 75% of meals at this time. Labs and medications reviewed. No further nutrition interventions warranted at this time.  If additional nutrition issues arise, please re-consult RD.   Recommend MD to discuss daily fluid and activity (if appropriate) goals with patient.   Colman Cater MS,RD,CSG,LDN Office: [phone] Pager: [phone]

## 2016-02-27 ENCOUNTER — Other Ambulatory Visit: Payer: Self-pay | Admitting: *Deleted

## 2016-02-27 ENCOUNTER — Other Ambulatory Visit: Payer: Self-pay | Admitting: Adult Health

## 2016-02-27 DIAGNOSIS — I10 Essential (primary) hypertension: Secondary | ICD-10-CM

## 2016-02-27 DIAGNOSIS — I5033 Acute on chronic diastolic (congestive) heart failure: Secondary | ICD-10-CM

## 2016-02-27 DIAGNOSIS — I5031 Acute diastolic (congestive) heart failure: Secondary | ICD-10-CM

## 2016-02-27 LAB — GLUCOSE, CAPILLARY
Glucose-Capillary: 111 mg/dL — ABNORMAL HIGH (ref 65–99)
Glucose-Capillary: 225 mg/dL — ABNORMAL HIGH (ref 65–99)
Glucose-Capillary: 299 mg/dL — ABNORMAL HIGH (ref 65–99)

## 2016-02-27 LAB — BASIC METABOLIC PANEL
Anion gap: 7 (ref 5–15)
BUN: 15 mg/dL (ref 6–20)
CALCIUM: 8.2 mg/dL — AB (ref 8.9–10.3)
CO2: 34 mmol/L — AB (ref 22–32)
CREATININE: 0.85 mg/dL (ref 0.61–1.24)
Chloride: 96 mmol/L — ABNORMAL LOW (ref 101–111)
GFR calc Af Amer: >60 mL/min (ref >60)
GFR calc non Af Amer: >60 mL/min (ref >60)
GLUCOSE: 226 mg/dL — AB (ref 65–99)
Potassium: 3.6 mmol/L (ref 3.5–5.1)
Sodium: 137 mmol/L (ref 135–145)

## 2016-02-27 MED ORDER — POTASSIUM CHLORIDE CRYS ER 20 MEQ PO TBCR: 20.0000 meq | EXTENDED_RELEASE_TABLET | Freq: Two times a day (BID) | ORAL | Status: DC

## 2016-02-27 MED ORDER — FUROSEMIDE 80 MG PO TABS: 80.0000 mg | ORAL_TABLET | Freq: Two times a day (BID) | ORAL | Status: DC

## 2016-02-27 MED ORDER — AMLODIPINE BESYLATE 2.5 MG PO TABS: 2.5000 mg | ORAL_TABLET | Freq: Every day | ORAL | Status: DC

## 2016-02-27 NOTE — Progress Notes (Signed)
   Subjective: Pt breathing easier,  No CP   Objective: Filed Vitals:   02/26/16 2046 02/27/16 0614 02/27/16 0720 02/27/16 0726  BP: 144/62 162/80    Pulse: 72 63    Temp: 98.5 F (36.9 C) 98 F (36.7 C)    TempSrc: Oral Oral    Resp: 17 20    Height:      Weight:  315 lb 12.8 oz (143.246 kg)    SpO2: 96% 96% 88% 94%   Weight change: -5 lb 11.2 oz (-2.586 kg)  Intake/Output Summary (Last 24 hours) at 02/27/16 0854 Last data filed at 02/26/16 2015  Gross per 24 hour  Intake    720 ml  Output   1500 ml  Net   -780 ml   Net  I/O 6.3 L   General: Alert, awake, oriented x3, in no acute distress Neck:  Neck is full Heart: Regular rate and rhythm, without murmurs, rubs, gallops.  Lungs: Clear to auscultation.  No rales or wheezes. Exemities:  2+ edema.   Neuro: Grossly intact, nonfocal.   Lab Results: Results for orders placed or performed during the hospital encounter of 02/24/16 (from the past 24 hour(s))  Glucose, capillary     Status: Abnormal   Collection Time: 02/26/16 11:10 AM  Result Value Ref Range   Glucose-Capillary 239 (H) 65 - 99 mg/dL  Glucose, capillary     Status: Abnormal   Collection Time: 02/26/16  4:36 PM  Result Value Ref Range   Glucose-Capillary 185 (H) 65 - 99 mg/dL  Glucose, capillary     Status: Abnormal   Collection Time: 02/26/16  8:32 PM  Result Value Ref Range   Glucose-Capillary 113 (H) 65 - 99 mg/dL   Comment 1 Notify RN    Comment 2 Document in Chart   Glucose, capillary     Status: Abnormal   Collection Time: 02/27/16 12:42 AM  Result Value Ref Range   Glucose-Capillary 111 (H) 65 - 99 mg/dL  Basic metabolic panel     Status: Abnormal   Collection Time: 02/27/16  5:48 AM  Result Value Ref Range   Sodium 137 135 - 145 mmol/L   Potassium 3.6 3.5 - 5.1 mmol/L   Chloride 96 (L) 101 - 111 mmol/L   CO2 34 (H) 22 - 32 mmol/L   Glucose, Bld 226 (H) 65 - 99 mg/dL   BUN 15 6 - 20 mg/dL   Creatinine, Ser 0.85 0.61 - 1.24 mg/dL   Calcium 8.2 (L) 8.9 - 10.3 mg/dL   GFR calc non Af Amer >60 >60 mL/min   GFR calc Af Amer >60 >60 mL/min   Anion gap 7 5 - 15    Studies/Results: No results found.  Medications:REviewed 1  Acute on chronic diastolic CHF  Echo yesterday showed LVEF 55 to 60% Pt has diuresed some more yesterday  He is much more comfortable .Still with volume increase on exam  But, laying comfortably I think it is OKto  D/C today with continued outpt diuresis. Close f/u of labs Reviewed 2 G Na and 2 L fluids Would keep on lasix 80 po bid and also  KCL  Bid    WIll arrange fr outpt f/u of labs and clnic visit    @PROBHOSP @  LOS: 3 days   Dorris Carnes 02/27/2016, 8:54 AM

## 2016-02-27 NOTE — Progress Notes (Signed)
SATURATION QUALIFICATIONS: (This note is used to comply with regulatory documentation for home oxygen)  Patient Saturations on Room Air at Rest = 94%  Patient Saturations on Room Air while Ambulating = 86%  Patient Saturations on 2 Liters of oxygen while Ambulating = 96%  Please briefly explain why patient needs home oxygen:  Pt will require home oxygen at 2 L/min in order to keep oxygen saturations above 90%.

## 2016-02-27 NOTE — Progress Notes (Signed)
Patient discharged home.  IV removed - WNL.  Reviewed DC instructions and medications.  Educated on HF management at home, including daily weights and low Na+ diet, with teachback.  Patient verbalizes understanding.  No questions at this time.  Follow up in place.  Assisted off unit via WC in NAD.

## 2016-02-27 NOTE — Discharge Summary (Addendum)
Physician Discharge Summary  Jose [name] [id number] DOB: Jul 15, 1951 DOA: 02/24/2016  PCP: No PCP Per Patient  Admit date: 02/24/2016 Discharge date: 02/27/2016  Admitted From: Home Disposition:  Home  Recommendations for Outpatient Follow-up:  1. Follow up with PCP in 1-2 weeks 2. Please obtain BMP/CBC in one week 3. Follow-up with outpatient cardiology as arranged.   Home Health: None Equipment/Devices: Patient discharged on 2L oxygen  Discharge Condition: Stable CODE STATUS: Full Diet recommendation:  Heart healthy   Brief/Interim Summary: 41 yom presented with complaints of progressive shortness of breath. While being evaluated in the ED, he was noted to have volume overload with significant pedal edema. He was started on IV lasix and referred for admission.   Discharge Diagnoses:  Active Problems:   Acute diastolic CHF (congestive heart failure) (HCC)   COPD (chronic obstructive pulmonary disease) (HCC)   Acute respiratory failure with hypoxia (HCC)   HTN (hypertension)   Diabetes (HCC)   Morbid obesity (HCC)   OSA (obstructive sleep apnea)   Elevated troponin   Accelerated hypertension  Patient was admitted for acute diastolic CHF exacerbation. Started on IV Lasix and with monitoring of intake and output. [name] Kitchen He is already on a beta blocker and ACE-I which were continued. ECHO as below. -7.9l volume status since admission. He continues to have evidence of volume overload with significant pedal edema. Discussed with cardiology and it was felt safe to discharge the pt home with high dose oral lasix to continue diuresis at home. His respiratory status has improved. Per cardiology he has been discharged on oral lasix 80 PO BID and KCL BID. Follow-up with cardiology as outpatient. 1. Acute respiratory failure with hypoxia. Likely related to CHF exacerbation. Respiratory status has improved with diuresis. Patient still desaturates to 86% on RA while ambulating. He will be  discharged home with supplemental oxygen for now. Hopefully this can be further weaned off as an outpatient.  2. Elevated troponin. Likely demand ischemia in the setting of CHF exacerbation.  He does not have any chest pain. 3. History of coronary artery disease. Last cardiac catheterization in 2015. Found to have an occluded RCA at that time that was not amenable to intervention. Continued on aspirin and Plavix. 4. Diabetes. Blood sugars appear stable.  Remained on home insulin regimen with Lantus and NovoLog. Supplemental sliding scale insulin provided. Discussed importance of diet in regulation of blood sugars as outpatient.  5. Obstructive sleep apnea. Continue CPAP 6. Essential Hypertension. Continued on outpatient regimen. 7. COPD, stable. No evidence of wheezing or exacerbation. Continued on outpatient bronchodilators. 8. Morbid obesity. Discussed importance of diet and exercise. Nutrition followed.   Discharge Instructions      Discharge Instructions    Diet - low sodium heart healthy    Complete by:  As directed      Diet - low sodium heart healthy    Complete by:  As directed      Increase activity slowly    Complete by:  As directed      Increase activity slowly    Complete by:  As directed             Medication List    TAKE these medications        albuterol 108 (90 Base) MCG/ACT inhaler  Commonly known as:  PROVENTIL HFA;VENTOLIN HFA  Inhale 2 puffs into the lungs every 6 (six) hours as needed for wheezing or shortness of breath.     amLODipine 2.5 MG tablet  Commonly known as:  NORVASC  Take 1 tablet (2.5 mg total) by mouth daily.     aspirin 81 MG tablet  Take 81 mg by mouth daily.     atorvastatin 80 MG tablet  Commonly known as:  LIPITOR  Take 1 tablet (80 mg total) by mouth daily at 6 PM.     clopidogrel 75 MG tablet  Commonly known as:  PLAVIX  Take 1 tablet (75 mg total) by mouth daily with breakfast.     docusate sodium 100 MG capsule  Commonly  known as:  COLACE  Take 100-200 mg by mouth daily. Take one tablet when constipation starts if continues take another one.     ergocalciferol 50000 units capsule  Commonly known as:  VITAMIN D2  Take 50,000 Units by mouth See admin instructions. Take one twice a week on wednesdays and saturdays     ferrous sulfate 325 (65 FE) MG tablet  Take 325 mg by mouth 3 (three) times daily with meals.     Fish Oil 1000 MG Caps  Take 1 capsule by mouth See admin instructions. Take 8 tables a day to lower triglycerides per list     furosemide 80 MG tablet  Commonly known as:  LASIX  Take 1 tablet (80 mg total) by mouth 2 (two) times daily.     glipiZIDE 5 MG tablet  Commonly known as:  GLUCOTROL  Take 5 mg by mouth 4 (four) times daily. Take 4 times a day per list     HYDROcodone-acetaminophen 5-325 MG tablet  Commonly known as:  NORCO/VICODIN  Take 1 tablet by mouth every 4 (four) hours as needed for moderate pain (Takes 2 tabs every 4 hours).     ICAPS AREDS 2 PO  Take 1 capsule by mouth 2 (two) times daily.     insulin aspart 100 UNIT/ML injection  Commonly known as:  novoLOG  Inject 30 Units into the skin 3 (three) times daily before meals.     insulin glargine 100 UNIT/ML injection  Commonly known as:  LANTUS  Inject 30-60 Units into the skin See admin instructions. 30 units in the AM and 60 units in the PM     lisinopril 40 MG tablet  Commonly known as:  PRINIVIL,ZESTRIL  Take 40 mg by mouth daily.     metFORMIN 1000 MG tablet  Commonly known as:  GLUCOPHAGE  Take 1 tablet (1,000 mg total) by mouth 2 (two) times daily with a meal.     metoprolol 50 MG tablet  Commonly known as:  LOPRESSOR  Take 50 mg by mouth 2 (two) times daily.     nitroGLYCERIN 0.4 MG SL tablet  Commonly known as:  NITROSTAT  Place 1 tablet (0.4 mg total) under the tongue every 5 (five) minutes x 3 doses as needed for chest pain.     oxyCODONE-acetaminophen 5-325 MG tablet  Commonly known as:   PERCOCET/ROXICET  Take 2 tablets by mouth every 4 (four) hours. Take eight tablets daily as needed for pain per family and list. For bad  Back pain     potassium chloride SA 20 MEQ tablet  Commonly known as:  K-DUR,KLOR-CON  Take 1 tablet (20 mEq total) by mouth 2 (two) times daily.     sertraline 100 MG tablet  Commonly known as:  ZOLOFT  Take 100 mg by mouth daily.     terazosin 2 MG capsule  Commonly known as:  HYTRIN  Take 4 mg by mouth  every evening.     tiotropium 18 MCG inhalation capsule  Commonly known as:  SPIRIVA  Place 18 mcg into inhaler and inhale at bedtime.     vitamin B-12 1000 MCG tablet  Commonly known as:  CYANOCOBALAMIN  Take 1,000 mcg by mouth daily.       Follow-up Information    Follow up with Charlie Pitter, PA-C On 03/03/2016.   Specialties:  Cardiology, Radiology   Why:  2PM   Contact information:   [place] [address] [phone]      No Known Allergies  Consultations:  Cardiology    Procedures/Studies: Dg Chest 2 View  02/24/2016  CLINICAL DATA:  Evaluate for pulmonary edema EXAM: CHEST  2 VIEW COMPARISON:  08/26/2014 FINDINGS: Chronic hyperinflation. Few, new Kerley lines compatible with mild edema. Chronic cardiomegaly. Stable aortic and hilar contours. No indication of pneumonia. No acute osseous finding. IMPRESSION: Mild interstitial pulmonary edema. Electronically Signed   By: Monte Fantasia M.D.   On: 02/24/2016 13:17   ECHO Study Conclusions  - Left ventricle: Poor acoustic windows limit study Definity used  to optimize Overall LVEF appears normal at 55 to 60% The cavity  size was normal. Wall thickness was increased in a pattern of  moderate LVH. Doppler parameters are consistent with abnormal  left ventricular relaxation (grade 1 diastolic dysfunction). - Left atrium: The atrium was moderately dilated.   Subjective: Feeling pretty good. Hs no further pains or shortness of breath.   Discharge  Exam: Filed Vitals:   02/26/16 2046 02/27/16 0614  BP: 144/62 162/80  Pulse: 72 63  Temp: 98.5 F (36.9 C) 98 F (36.7 C)  Resp: 17 20   Filed Vitals:   02/26/16 2046 02/27/16 0614 02/27/16 0720 02/27/16 0726  BP: 144/62 162/80    Pulse: 72 63    Temp: 98.5 F (36.9 C) 98 F (36.7 C)    TempSrc: Oral Oral    Resp: 17 20    Height:      Weight:  143.246 kg (315 lb 12.8 oz)    SpO2: 96% 96% 88% 94%    General: Pt is alert, awake, not in acute distress Cardiovascular: RRR, no rubs, no gallops Respiratory: CTA bilaterally, no wheezing, no rhonchi Abdominal: Soft, NT, ND,  Extremities: 2+ pedal BLE edema, no cyanosis    The results of significant diagnostics from this hospitalization (including imaging, microbiology, ancillary and laboratory) are listed below for reference.     Microbiology: No results found for this or any previous visit (from the past 240 hour(s)).   Labs: BNP (last 3 results)  Recent Labs  02/24/16 1120  BNP [id number]   Basic Metabolic Panel:  Recent Labs Lab 02/24/16 1120 02/25/16 0616 02/26/16 0558 02/27/16 0548  NA 136 139 138 137  K 3.7 3.6 3.8 3.6  CL 95* 95* 95* 96*  CO2 32 34* 37* 34*  GLUCOSE 271* 170* 212* 226*  BUN 15 13 14 15   CREATININE 0.82 0.85 0.81 0.85  CALCIUM 8.6* 8.3* 8.4* 8.2*   Liver Function Tests:  Recent Labs Lab 02/24/16 1120  AST 22  ALT 19  ALKPHOS 53  BILITOT 0.5  PROT 7.0  ALBUMIN 3.2*   No results for input(s): LIPASE, AMYLASE in the last 168 hours. No results for input(s): AMMONIA in the last 168 hours. CBC:  Recent Labs Lab 02/24/16 1120  WBC 7.7  NEUTROABS 5.7  HGB 14.8  HCT 45.6  MCV 90.8  PLT  188   Cardiac Enzymes:  Recent Labs Lab 02/24/16 1120 02/24/16 1848 02/25/16 0034 02/25/16 0619  TROPONINI 0.20* 0.20* 0.24* 0.17*   BNP: Invalid input(s): POCBNP CBG:  Recent Labs Lab 02/26/16 1636 02/26/16 2032 02/27/16 0042 02/27/16 0725 02/27/16 1144  GLUCAP 185* 113*  111* 225* 299*   Lipid Profile  Recent Labs  02/26/16 0558  CHOL 113  HDL 26*  LDLCALC 34  TRIG 265*  CHOLHDL 4.3   Urinalysis    Component Value Date/Time   COLORURINE YELLOW 02/25/2016 2135   APPEARANCEUR CLEAR 02/25/2016 2135   LABSPEC 1.015 02/25/2016 2135   PHURINE 6.0 02/25/2016 2135   GLUCOSEU 100* 02/25/2016 2135   HGBUR SMALL* 02/25/2016 2135   BILIRUBINUR NEGATIVE 02/25/2016 2135   [place] NEGATIVE 02/25/2016 2135   PROTEINUR 100* 02/25/2016 2135   NITRITE NEGATIVE 02/25/2016 2135   LEUKOCYTESUR NEGATIVE 02/25/2016 2135     Time coordinating discharge: Over 30 minutes  SIGNED:  Kathie Dike, MD   [facility] 02/27/2016, 3:32 PM If 7PM-7AM, please contact night-coverage [email or web address] Password [email or web address]    By signing my name below, I, Rennis Harding, attest that this documentation has been prepared under the direction and in the presence of Kathie Dike, MD. Electronically signed: Rennis Harding, Scribe.02/27/2016 12:40pm  I, Dr. Kathie Dike, personally performed the services described in this documentaiton. All medical record entries made by the scribe were at my direction and in my presence. I have reviewed the chart and agree that the record reflects my personal performance and is accurate and complete  Kathie Dike, MD, 02/27/2016 3:32 PM

## 2016-02-27 NOTE — Care Management Important Message (Signed)
Important Message  Patient Details  Name: Jose [name] MRN: [id number] Date of Birth: Oct 21, 1950   Medicare Important Message Given:  Yes    Sherald Barge, RN 02/27/2016, 9:29 AM

## 2016-02-27 NOTE — Care Management Note (Signed)
Case Management Note  Patient Details  Name: Jose [name] MRN: [id number] Date of Birth: 10/31/1950  Expected Discharge Date:  02/26/16               Expected Discharge Plan:  Home/Self Care  In-House Referral:  NA  Discharge planning Services  CM Consult  Post Acute Care Choice:  Durable Medical Equipment Choice offered to:  Patient  DME Arranged:  Oxygen DME Agency:  [place]:    [facility] Agency:     Status of Service:  Completed, signed off  If discussed at [place] of Stay Meetings, dates discussed:    Additional Comments: Pt discharging discharging home today with self care. Pt has met requirements for home O2. Pt has chosen [facility] from list of DME providers. Romualdo Bolk, of [facility], made aware of admission and will obtain pt info from chart and deliver port O2 tank to pt's room prior to DC. Pt's DC summary will be faxed to [facility] when available.   Sherald Barge, RN 02/27/2016, 2:40 PM

## 2016-03-02 ENCOUNTER — Encounter: Payer: Self-pay | Admitting: Physician Assistant

## 2016-03-02 NOTE — Progress Notes (Deleted)
cx

## 2016-03-03 ENCOUNTER — Encounter: Payer: Medicare Other | Admitting: Physician Assistant

## 2016-03-03 ENCOUNTER — Other Ambulatory Visit (HOSPITAL_COMMUNITY)
Admission: RE | Admit: 2016-03-03 | Discharge: 2016-03-03 | Disposition: A | Discharge status: Home / Self Care | Payer: Medicare Other | Source: Ambulatory Visit | Attending: Cardiology | Admitting: Cardiology

## 2016-03-03 ENCOUNTER — Ambulatory Visit (INDEPENDENT_AMBULATORY_CARE_PROVIDER_SITE_OTHER): Payer: Medicare Other | Admitting: Cardiology

## 2016-03-03 VITALS — BP 144/80 | HR 98 | Ht 72.0 in | Wt 308.0 lb

## 2016-03-03 DIAGNOSIS — I10 Essential (primary) hypertension: Secondary | ICD-10-CM

## 2016-03-03 DIAGNOSIS — I5033 Acute on chronic diastolic (congestive) heart failure: Secondary | ICD-10-CM

## 2016-03-03 DIAGNOSIS — I2510 Atherosclerotic heart disease of native coronary artery without angina pectoris: Secondary | ICD-10-CM

## 2016-03-03 DIAGNOSIS — E785 Hyperlipidemia, unspecified: Secondary | ICD-10-CM

## 2016-03-03 LAB — BASIC METABOLIC PANEL
Anion gap: 11 (ref 5–15)
BUN: 23 mg/dL — AB (ref 6–20)
CALCIUM: 9.4 mg/dL (ref 8.9–10.3)
CO2: 29 mmol/L (ref 22–32)
CREATININE: 1.06 mg/dL (ref 0.61–1.24)
Chloride: 93 mmol/L — ABNORMAL LOW (ref 101–111)
GFR calc Af Amer: >60 mL/min (ref >60)
GLUCOSE: 163 mg/dL — AB (ref 65–99)
POTASSIUM: 4.8 mmol/L (ref 3.5–5.1)
SODIUM: 133 mmol/L — AB (ref 135–145)

## 2016-03-03 LAB — MAGNESIUM: MAGNESIUM: 1.6 mg/dL — AB (ref 1.7–2.4)

## 2016-03-03 MED ORDER — METOPROLOL TARTRATE 25 MG PO TABS
25.0000 mg | ORAL_TABLET | Freq: Two times a day (BID) | ORAL | Status: DC
Start: 2016-03-03 — End: 2016-03-23

## 2016-03-03 NOTE — Patient Instructions (Signed)
Your physician recommends that you schedule a follow-up appointment in: 2 Week with Dr. Harl Bowie  Your physician has recommended you make the following change in your medication:   STOP Taking Plavix  Decrease Metoprolol to 25 mg Two Times Daily in Your physician recommends that you have lab work done today.  If you need a refill on your cardiac medications before your next appointment, please call your pharmacy.  Thank you for choosing [place]!

## 2016-03-03 NOTE — Progress Notes (Signed)
Clinical Summary Jose [name] is a 65 y.o.male seen today for hospital follow up appointment, this is our first visit together.  1. Chronic diastolic HF - admit late [id number] with acute on chronic diastolic HF - diuresed, discharge weight 315 lbs.  01/2016 echo LVEF 0000000, grade I diastolic dysfunction  - home scale 302 lbs. LE edema continues to improve - limiting sodium intake. Compliant with lasix 80mg  bid. Having some cramps at times.    2. CAD - previous cath 07/2014 with LM patent, LAD ostial 40-50%, LCX mld disease, RCA occluded filling by collaterals. Unsuccesful attempt at RCA PCI.  - denies any recent chest pain.  - has been on DAPT since 07/2014.  - - during recent admit with acute on chronic diastolic HF and HTN, peak troponin 0.24 - echo with normal LVEF, no WMAs  3.HTN - compliant with meds  4. Hyperlipidemia - compliant with statin  - 01/2016 TC 113 TG 265 HDL 26 LDL 34  5. COPD - compliant with spiriva.   6. OSA - compliant with CPAP Past Medical History  Diagnosis Date  . Hypertension   . Sleep apnea   . COPD (chronic obstructive pulmonary disease) ([place])   . Diabetes mellitus ([place])   . Neuropathy ([place])   . CAD (coronary artery disease)     a. prior silent inferior MI, NSTEMI 07/2014 with chronically occluded RCA s/p unsuccessful PCI with otherwise nonobstructive residual disease  . Morbid obesity ([place])   . Former tobacco use   . Hyperlipidemia   . Chronic respiratory failure ([place])     a. Placed on home O2 01/2016.     No Known Allergies   Current Outpatient Prescriptions  Medication Sig Dispense Refill  . albuterol (PROVENTIL HFA;VENTOLIN HFA) 108 (90 BASE) MCG/ACT inhaler Inhale 2 puffs into the lungs every 6 (six) hours as needed for wheezing or shortness of breath.    [name] Kitchen amLODipine (NORVASC) 2.5 MG tablet Take 1 tablet (2.5 mg total) by mouth daily. 30 tablet 0  . aspirin 81 MG tablet Take 81 mg by mouth daily.    [name] Kitchen atorvastatin (LIPITOR) 80  MG tablet Take 1 tablet (80 mg total) by mouth daily at 6 PM. 30 tablet 3  . clopidogrel (PLAVIX) 75 MG tablet Take 1 tablet (75 mg total) by mouth daily with breakfast. 30 tablet 3  . docusate sodium (COLACE) 100 MG capsule Take 100-200 mg by mouth daily. Take one tablet when constipation starts if continues take another one.    . ergocalciferol (VITAMIN D2) 50000 UNITS capsule Take 50,000 Units by mouth See admin instructions. Take one twice a week on wednesdays and saturdays    . ferrous sulfate 325 (65 FE) MG tablet Take 325 mg by mouth 3 (three) times daily with meals.    . furosemide (LASIX) 80 MG tablet Take 1 tablet (80 mg total) by mouth 2 (two) times daily. 60 tablet 0  . glipiZIDE (GLUCOTROL) 5 MG tablet Take 5 mg by mouth 4 (four) times daily. Take 4 times a day per list    . HYDROcodone-acetaminophen (NORCO/VICODIN) 5-325 MG tablet Take 1 tablet by mouth every 4 (four) hours as needed for moderate pain (Takes 2 tabs every 4 hours).    . insulin aspart (NOVOLOG) 100 UNIT/ML injection Inject 30 Units into the skin 3 (three) times daily before meals.     . insulin glargine (LANTUS) 100 UNIT/ML injection Inject 30-60 Units into the skin See admin instructions. 30 units  in the AM and 60 units in the PM    . lisinopril (PRINIVIL,ZESTRIL) 40 MG tablet Take 40 mg by mouth daily.    . metFORMIN (GLUCOPHAGE) 1000 MG tablet Take 1 tablet (1,000 mg total) by mouth 2 (two) times daily with a meal. 60 tablet 3  . metoprolol (LOPRESSOR) 50 MG tablet Take 50 mg by mouth 2 (two) times daily.    . Multiple Vitamins-Minerals (ICAPS AREDS 2 PO) Take 1 capsule by mouth 2 (two) times daily.     . nitroGLYCERIN (NITROSTAT) 0.4 MG SL tablet Place 1 tablet (0.4 mg total) under the tongue every 5 (five) minutes x 3 doses as needed for chest pain. 25 tablet 12  . Omega-3 Fatty Acids (FISH OIL) 1000 MG CAPS Take 1 capsule by mouth See admin instructions. Take 8 tables a day to lower triglycerides per list    .  oxyCODONE-acetaminophen (PERCOCET/ROXICET) 5-325 MG per tablet Take 2 tablets by mouth every 4 (four) hours. Take eight tablets daily as needed for pain per family and list. For bad  Back pain    . potassium chloride SA (K-DUR,KLOR-CON) 20 MEQ tablet Take 1 tablet (20 mEq total) by mouth 2 (two) times daily. 60 tablet 0  . sertraline (ZOLOFT) 100 MG tablet Take 100 mg by mouth daily.    [name] Kitchen terazosin (HYTRIN) 2 MG capsule Take 4 mg by mouth every evening.    . tiotropium (SPIRIVA) 18 MCG inhalation capsule Place 18 mcg into inhaler and inhale at bedtime.     . vitamin B-12 (CYANOCOBALAMIN) 1000 MCG tablet Take 1,000 mcg by mouth daily.     No current facility-administered medications for this visit.     Past Surgical History  Procedure Laterality Date  . Back surgery    . Left heart catheterization with coronary angiogram N/A 08/28/2014    Procedure: LEFT HEART CATHETERIZATION WITH CORONARY ANGIOGRAM;  Surgeon: Clent Demark, MD;  Location: [facility] CATH LAB;  Service: Cardiovascular;  Laterality: N/A;     No Known Allergies    No family history on file.   Social History Jose [name] reports that he has quit smoking. His smoking use included Cigarettes. He has a 135 pack-year smoking history. He does not have any smokeless tobacco history on file. Jose [name] reports that he does not drink alcohol.   Review of Systems CONSTITUTIONAL: No weight loss, fever, chills, weakness or fatigue.  HEENT: Eyes: No visual loss, blurred vision, double vision or yellow sclerae.No hearing loss, sneezing, congestion, runny nose or sore throat.  SKIN: No rash or itching.  CARDIOVASCULAR: per HPI RESPIRATORY: No shortness of breath, cough or sputum.  GASTROINTESTINAL: No anorexia, nausea, vomiting or diarrhea. No abdominal pain or blood.  GENITOURINARY: No burning on urination, no polyuria NEUROLOGICAL: No headache, dizziness, syncope, paralysis, ataxia, numbness or tingling in the extremities. No change in  bowel or bladder control.  MUSCULOSKELETAL: No muscle, back pain, joint pain or stiffness.  LYMPHATICS: No enlarged nodes. No history of splenectomy.  PSYCHIATRIC: No history of depression or anxiety.  ENDOCRINOLOGIC: No reports of sweating, cold or heat intolerance. No polyuria or polydipsia.  [name] Kitchen   Physical Examination Filed Vitals:   03/03/16 1433  BP: 144/80  Pulse: 98   Filed Vitals:   03/03/16 1433  Height: 6' (1.829 m)  Weight: 308 lb (139.708 kg)    Gen: resting comfortably, no acute distress HEENT: no scleral icterus, pupils equal round and reactive, no palptable cervical adenopathy,  CV: RRR, no  m/r/g, no jvd Resp: Clear to auscultation bilaterally GI: abdomen is soft, non-tender, non-distended, normal bowel sounds, no hepatosplenomegaly MSK: extremities are warm, 1-2+edema Skin: warm, no rash Neuro:  no focal deficits Psych: appropriate affect   Diagnostic Studies 07/2014 cath FINDINGS: LV showed inferobasal wall severe hypokinesia, EF of 50-55%. Left main was long which was patent. LAD has 40-50% ostial stenosis and 20-30% proximal stenosis. Diagonal 1 and 2 were small which had mild disease. Ramus was small which was patent. Left circumflex has mild disease. OM1 is moderate sized, which is patent. OM 2 is small but long vessel, which has mild disease. RCA was 100% occluded beyond proximal portion filling by bridging collaterals and also collaterals from the left system.  INTERVENTIONAL PROCEDURE: Attempted to pass the wire into RCA using multiple wires i.e. ChoICE PT, [place], Runthrough, Fielder FC, and [employer]. Finally Fielder XT wire could be passed up to the mid portion of the RCA, but no balloon catheter could be advanced beyond the proximal portion, initially tried 2.5 x 12 mm long and then 1.20 x 20 mm long without success. The patient did not have any episodes of chest pain during the procedure. The patient tolerated the procedure  well. There were no complications. The patient was transferred to recovery room in stable condition. Plan is to treat him medically.    Assessment and Plan  1. Acute on chronic diastolic HF - remains volume overloaded, will continue current lasix dose - repeat labs   2. CAD - mild troponin elevation during recent admission with CHF, denies any recent chest pain - stop plavix, no indication for continued use  3. HTN - mildly above goal, continue to monitor with diuresis. Home numbers are at goal  4. Hyperlipidemia - counseled on diet and exercise to improve HDL and TGs, LDL at goal   F/u 2 weels    Arnoldo Lenis, M.D.

## 2016-03-04 ENCOUNTER — Telehealth: Payer: Self-pay

## 2016-03-04 MED ORDER — MAGNESIUM OXIDE 400 MG PO TABS: 400.0000 mg | ORAL_TABLET | Freq: Every day | ORAL | Status: AC

## 2016-03-04 NOTE — Telephone Encounter (Signed)
-----   Message from Arnoldo Lenis, MD sent at 03/04/2016  1:24 PM EDT ----- Labs look good other than magnesium is low. He should start magnesium oxide 400mg  bid x 4 days, then take 400mg  daily. This could explain some of his cramping  Zandra Abts MD

## 2016-03-04 NOTE — Telephone Encounter (Signed)
Called pt, no answer, left message for pt to return call. Sent in rx for magnesium.

## 2016-03-04 NOTE — Progress Notes (Signed)
This encounter was created in error - please disregard.  This encounter was created in error - please disregard.

## 2016-03-05 ENCOUNTER — Encounter: Payer: Self-pay | Admitting: Cardiology

## 2016-03-16 ENCOUNTER — Inpatient Hospital Stay (HOSPITAL_COMMUNITY)
Admission: EM | Admit: 2016-03-16 | Discharge: 2016-03-23 | DRG: 067 | Disposition: A | Discharge status: Home / Self Care | Payer: Medicare Other | Attending: Neurology | Admitting: Neurology

## 2016-03-16 ENCOUNTER — Emergency Department (HOSPITAL_COMMUNITY): Payer: Medicare Other

## 2016-03-16 ENCOUNTER — Encounter (HOSPITAL_COMMUNITY): Payer: Self-pay

## 2016-03-16 DIAGNOSIS — I6529 Occlusion and stenosis of unspecified carotid artery: Secondary | ICD-10-CM | POA: Diagnosis present

## 2016-03-16 DIAGNOSIS — I708 Atherosclerosis of other arteries: Secondary | ICD-10-CM | POA: Diagnosis present

## 2016-03-16 DIAGNOSIS — E1165 Type 2 diabetes mellitus with hyperglycemia: Secondary | ICD-10-CM | POA: Diagnosis present

## 2016-03-16 DIAGNOSIS — I509 Heart failure, unspecified: Secondary | ICD-10-CM

## 2016-03-16 DIAGNOSIS — H9190 Unspecified hearing loss, unspecified ear: Secondary | ICD-10-CM | POA: Diagnosis present

## 2016-03-16 DIAGNOSIS — Z9981 Dependence on supplemental oxygen: Secondary | ICD-10-CM

## 2016-03-16 DIAGNOSIS — G458 Other transient cerebral ischemic attacks and related syndromes: Secondary | ICD-10-CM | POA: Diagnosis not present

## 2016-03-16 DIAGNOSIS — E1142 Type 2 diabetes mellitus with diabetic polyneuropathy: Secondary | ICD-10-CM | POA: Diagnosis present

## 2016-03-16 DIAGNOSIS — E781 Pure hyperglyceridemia: Secondary | ICD-10-CM | POA: Diagnosis present

## 2016-03-16 DIAGNOSIS — R4702 Dysphasia: Secondary | ICD-10-CM | POA: Diagnosis not present

## 2016-03-16 DIAGNOSIS — R4701 Aphasia: Secondary | ICD-10-CM | POA: Diagnosis present

## 2016-03-16 DIAGNOSIS — E119 Type 2 diabetes mellitus without complications: Secondary | ICD-10-CM | POA: Diagnosis present

## 2016-03-16 DIAGNOSIS — G451 Carotid artery syndrome (hemispheric): Secondary | ICD-10-CM | POA: Diagnosis present

## 2016-03-16 DIAGNOSIS — J9610 Chronic respiratory failure, unspecified whether with hypoxia or hypercapnia: Secondary | ICD-10-CM | POA: Diagnosis present

## 2016-03-16 DIAGNOSIS — E871 Hypo-osmolality and hyponatremia: Secondary | ICD-10-CM | POA: Diagnosis present

## 2016-03-16 DIAGNOSIS — Z7984 Long term (current) use of oral hypoglycemic drugs: Secondary | ICD-10-CM | POA: Diagnosis not present

## 2016-03-16 DIAGNOSIS — R471 Dysarthria and anarthria: Secondary | ICD-10-CM | POA: Diagnosis present

## 2016-03-16 DIAGNOSIS — I2510 Atherosclerotic heart disease of native coronary artery without angina pectoris: Secondary | ICD-10-CM | POA: Diagnosis present

## 2016-03-16 DIAGNOSIS — J449 Chronic obstructive pulmonary disease, unspecified: Secondary | ICD-10-CM | POA: Diagnosis present

## 2016-03-16 DIAGNOSIS — N179 Acute kidney failure, unspecified: Secondary | ICD-10-CM | POA: Diagnosis present

## 2016-03-16 DIAGNOSIS — G9341 Metabolic encephalopathy: Secondary | ICD-10-CM | POA: Diagnosis not present

## 2016-03-16 DIAGNOSIS — Z7982 Long term (current) use of aspirin: Secondary | ICD-10-CM

## 2016-03-16 DIAGNOSIS — Z6839 Body mass index (BMI) 39.0-39.9, adult: Secondary | ICD-10-CM

## 2016-03-16 DIAGNOSIS — I639 Cerebral infarction, unspecified: Secondary | ICD-10-CM | POA: Diagnosis not present

## 2016-03-16 DIAGNOSIS — G4733 Obstructive sleep apnea (adult) (pediatric): Secondary | ICD-10-CM | POA: Diagnosis present

## 2016-03-16 DIAGNOSIS — Z87891 Personal history of nicotine dependence: Secondary | ICD-10-CM | POA: Diagnosis not present

## 2016-03-16 DIAGNOSIS — E875 Hyperkalemia: Secondary | ICD-10-CM | POA: Diagnosis present

## 2016-03-16 DIAGNOSIS — I10 Essential (primary) hypertension: Secondary | ICD-10-CM | POA: Diagnosis not present

## 2016-03-16 DIAGNOSIS — I5033 Acute on chronic diastolic (congestive) heart failure: Secondary | ICD-10-CM | POA: Diagnosis not present

## 2016-03-16 DIAGNOSIS — R531 Weakness: Secondary | ICD-10-CM

## 2016-03-16 DIAGNOSIS — Z794 Long term (current) use of insulin: Secondary | ICD-10-CM | POA: Diagnosis not present

## 2016-03-16 DIAGNOSIS — E1159 Type 2 diabetes mellitus with other circulatory complications: Secondary | ICD-10-CM | POA: Diagnosis not present

## 2016-03-16 DIAGNOSIS — I6521 Occlusion and stenosis of right carotid artery: Secondary | ICD-10-CM | POA: Diagnosis not present

## 2016-03-16 DIAGNOSIS — R404 Transient alteration of awareness: Secondary | ICD-10-CM | POA: Diagnosis not present

## 2016-03-16 DIAGNOSIS — G459 Transient cerebral ischemic attack, unspecified: Secondary | ICD-10-CM | POA: Diagnosis present

## 2016-03-16 DIAGNOSIS — G8191 Hemiplegia, unspecified affecting right dominant side: Secondary | ICD-10-CM | POA: Diagnosis present

## 2016-03-16 DIAGNOSIS — L97529 Non-pressure chronic ulcer of other part of left foot with unspecified severity: Secondary | ICD-10-CM | POA: Diagnosis present

## 2016-03-16 DIAGNOSIS — I5032 Chronic diastolic (congestive) heart failure: Secondary | ICD-10-CM | POA: Diagnosis present

## 2016-03-16 DIAGNOSIS — Z79899 Other long term (current) drug therapy: Secondary | ICD-10-CM

## 2016-03-16 DIAGNOSIS — R0602 Shortness of breath: Secondary | ICD-10-CM | POA: Diagnosis not present

## 2016-03-16 DIAGNOSIS — I252 Old myocardial infarction: Secondary | ICD-10-CM

## 2016-03-16 DIAGNOSIS — I5031 Acute diastolic (congestive) heart failure: Secondary | ICD-10-CM | POA: Diagnosis present

## 2016-03-16 DIAGNOSIS — M549 Dorsalgia, unspecified: Secondary | ICD-10-CM | POA: Diagnosis present

## 2016-03-16 DIAGNOSIS — I110 Hypertensive heart disease with heart failure: Secondary | ICD-10-CM | POA: Diagnosis present

## 2016-03-16 DIAGNOSIS — G8929 Other chronic pain: Secondary | ICD-10-CM | POA: Diagnosis present

## 2016-03-16 DIAGNOSIS — I6522 Occlusion and stenosis of left carotid artery: Secondary | ICD-10-CM | POA: Diagnosis not present

## 2016-03-16 DIAGNOSIS — E1169 Type 2 diabetes mellitus with other specified complication: Secondary | ICD-10-CM | POA: Diagnosis present

## 2016-03-16 DIAGNOSIS — I952 Hypotension due to drugs: Secondary | ICD-10-CM | POA: Diagnosis not present

## 2016-03-16 DIAGNOSIS — R739 Hyperglycemia, unspecified: Secondary | ICD-10-CM | POA: Diagnosis present

## 2016-03-16 DIAGNOSIS — E785 Hyperlipidemia, unspecified: Secondary | ICD-10-CM

## 2016-03-16 LAB — DIFFERENTIAL
BASOS PCT: 0 %
Basophils Absolute: 0 10*3/uL (ref 0.0–0.1)
EOS PCT: 0 %
Eosinophils Absolute: 0.1 10*3/uL (ref 0.0–0.7)
LYMPHS ABS: 1.5 10*3/uL (ref 0.7–4.0)
LYMPHS PCT: 13 %
MONO ABS: 0.9 10*3/uL (ref 0.1–1.0)
MONOS PCT: 8 %
NEUTROS PCT: 79 %
Neutro Abs: 9.1 10*3/uL — ABNORMAL HIGH (ref 1.7–7.7)

## 2016-03-16 LAB — ETHANOL: Alcohol, Ethyl (B): <5 mg/dL (ref <5)

## 2016-03-16 LAB — CBC
HEMATOCRIT: 45.6 % (ref 39.0–52.0)
HEMOGLOBIN: 15.6 g/dL (ref 13.0–17.0)
MCH: 30.2 pg (ref 26.0–34.0)
MCHC: 34.2 g/dL (ref 30.0–36.0)
MCV: 88.4 fL (ref 78.0–100.0)
Platelets: 195 10*3/uL (ref 150–400)
RBC: 5.16 MIL/uL (ref 4.22–5.81)
RDW: 13.9 % (ref 11.5–15.5)
WBC: 11.6 10*3/uL — ABNORMAL HIGH (ref 4.0–10.5)

## 2016-03-16 LAB — I-STAT CHEM 8, ED
BUN: 48 mg/dL — AB (ref 6–20)
CALCIUM ION: 1.18 mmol/L (ref 1.12–1.23)
CREATININE: 2.1 mg/dL — AB (ref 0.61–1.24)
Chloride: 95 mmol/L — ABNORMAL LOW (ref 101–111)
GLUCOSE: 110 mg/dL — AB (ref 65–99)
HCT: 50 % (ref 39.0–52.0)
HEMOGLOBIN: 17 g/dL (ref 13.0–17.0)
Potassium: 5.9 mmol/L — ABNORMAL HIGH (ref 3.5–5.1)
Sodium: 134 mmol/L — ABNORMAL LOW (ref 135–145)
TCO2: 29 mmol/L (ref 0–100)

## 2016-03-16 LAB — COMPREHENSIVE METABOLIC PANEL
ALT: 30 U/L (ref 17–63)
AST: 28 U/L (ref 15–41)
Albumin: 4.1 g/dL (ref 3.5–5.0)
Alkaline Phosphatase: 56 U/L (ref 38–126)
Anion gap: 9 (ref 5–15)
BUN: 51 mg/dL — ABNORMAL HIGH (ref 6–20)
CHLORIDE: 92 mmol/L — AB (ref 101–111)
CO2: 29 mmol/L (ref 22–32)
CREATININE: 2.14 mg/dL — AB (ref 0.61–1.24)
Calcium: 9.7 mg/dL (ref 8.9–10.3)
GFR, EST AFRICAN AMERICAN: 36 mL/min — AB (ref >60)
GFR, EST NON AFRICAN AMERICAN: 31 mL/min — AB (ref >60)
Glucose, Bld: 113 mg/dL — ABNORMAL HIGH (ref 65–99)
POTASSIUM: 6.1 mmol/L — AB (ref 3.5–5.1)
SODIUM: 130 mmol/L — AB (ref 135–145)
Total Bilirubin: 0.5 mg/dL (ref 0.3–1.2)
Total Protein: 8.1 g/dL (ref 6.5–8.1)

## 2016-03-16 LAB — PROTIME-INR
INR: 1.01 (ref 0.00–1.49)
Prothrombin Time: 13.5 seconds (ref 11.6–15.2)

## 2016-03-16 LAB — I-STAT TROPONIN, ED: TROPONIN I, POC: 0.03 ng/mL (ref 0.00–0.08)

## 2016-03-16 LAB — APTT: aPTT: 28 seconds (ref 24–37)

## 2016-03-16 MED ORDER — ALTEPLASE (STROKE) FULL DOSE INFUSION
90.0000 mg | Freq: Once | INTRAVENOUS | Status: AC
  Administered 2016-03-16: 90 mg via INTRAVENOUS

## 2016-03-16 MED ORDER — SODIUM CHLORIDE 0.9 % IV SOLN: 50.0000 mL | Freq: Once | INTRAVENOUS | Status: DC

## 2016-03-16 MED ORDER — ALTEPLASE 100 MG IV SOLR
INTRAVENOUS | Status: AC
  Filled 2016-03-16: qty 100

## 2016-03-16 NOTE — ED Notes (Signed)
Pt in [place],famly in [place]

## 2016-03-16 NOTE — ED Notes (Signed)
Pt reading with wife assist

## 2016-03-16 NOTE — ED Provider Notes (Addendum)
CSN: [id number]     Arrival date & time 03/16/16  2225 History  By signing my name below, I, Gwenlyn Fudge, attest that this documentation has been prepared under the direction and in the presence of Noemi Chapel, MD. Electronically Signed: Gwenlyn Fudge, ED Scribe. 03/16/2016. 11:32 PM.      Chief Complaint  Patient presents with  . Code Stroke   The history is provided by the patient and the EMS personnel. No language interpreter was used.    HPI Comments: Jose [name] is a 65 y.o. male who presents to the Emergency Department complaining of Generalized ill feeling, it is unclear exactly what the initial symptoms were as the patient is having a hard time speaking. According to the EMS transport team the patient was initially feeling somewhat diaphoretic, he was on the bed, he required assistance walking, but had totally normal speech and mental status. He does walk with a walker at baseline because of lumbar back problems in the past. He does have a history of heart catheterization, he was taken off of Plavix last month and is currently just on a baby aspirin. In route to the hospital the patient became acutely aphasic and had some right-sided weakness. The patient is unable to give me any further history at this time.  Past Medical History  Diagnosis Date  . Hypertension   . Sleep apnea   . COPD (chronic obstructive pulmonary disease) ([place])   . Diabetes mellitus ([place])   . Neuropathy ([place])   . CAD (coronary artery disease)     a. prior silent inferior MI, NSTEMI 07/2014 with chronically occluded RCA s/p unsuccessful PCI with otherwise nonobstructive residual disease  . Morbid obesity ([place])   . Former tobacco use   . Hyperlipidemia   . Chronic respiratory failure ([place])     a. Placed on home O2 01/2016.   Past Surgical History  Procedure Laterality Date  . Back surgery    . Left heart catheterization with coronary angiogram N/A 08/28/2014    Procedure: LEFT HEART CATHETERIZATION WITH  CORONARY ANGIOGRAM;  Surgeon: Clent Demark, MD;  Location: [facility] CATH LAB;  Service: Cardiovascular;  Laterality: N/A;   History reviewed. No pertinent family history. Social History  Substance Use Topics  . Smoking status: Former Smoker -- 3.00 packs/day for 45 years    Types: Cigarettes  . Smokeless tobacco: None  . Alcohol Use: No    Review of Systems  Unable to perform ROS: Acuity of condition      Allergies  Review of patient's allergies indicates no known allergies.  Home Medications   Prior to Admission medications   Medication Sig Start Date End Date Taking? Authorizing Provider  amLODipine (NORVASC) 2.5 MG tablet Take 1 tablet (2.5 mg total) by mouth daily. 02/27/16   Kathie Dike, MD  aspirin 81 MG tablet Take 81 mg by mouth daily.    Historical Provider, MD  atorvastatin (LIPITOR) 80 MG tablet Take 1 tablet (80 mg total) by mouth daily at 6 PM. 08/29/14   Charolette Forward, MD  ergocalciferol (VITAMIN D2) 50000 UNITS capsule Take 50,000 Units by mouth See admin instructions. Take one twice a week on wednesdays and saturdays    Historical Provider, MD  ferrous sulfate 325 (65 FE) MG tablet Take 325 mg by mouth 3 (three) times daily with meals.    Historical Provider, MD  furosemide (LASIX) 80 MG tablet Take 1 tablet (80 mg total) by mouth 2 (two) times daily. 02/27/16  Kathie Dike, MD  glipiZIDE (GLUCOTROL) 5 MG tablet Take 5 mg by mouth 4 (four) times daily. Take 4 times a day per list    Historical Provider, MD  insulin aspart (NOVOLOG) 100 UNIT/ML injection Inject 30 Units into the skin 3 (three) times daily before meals.     Historical Provider, MD  insulin glargine (LANTUS) 100 UNIT/ML injection Inject 30-60 Units into the skin See admin instructions. 30 units in the AM and 60 units in the PM    Historical Provider, MD  lisinopril (PRINIVIL,ZESTRIL) 40 MG tablet Take 40 mg by mouth daily.    Historical Provider, MD  magnesium oxide (MAG-OX) 400 MG tablet Take 1 tablet  (400 mg total) by mouth daily. 03/04/16   Arnoldo Lenis, MD  metFORMIN (GLUCOPHAGE) 1000 MG tablet Take 1 tablet (1,000 mg total) by mouth 2 (two) times daily with a meal. 08/30/14   Charolette Forward, MD  metoprolol tartrate (LOPRESSOR) 25 MG tablet Take 1 tablet (25 mg total) by mouth 2 (two) times daily. 03/03/16   Arnoldo Lenis, MD  Multiple Vitamins-Minerals (ICAPS AREDS 2 PO) Take 1 capsule by mouth 2 (two) times daily.     Historical Provider, MD  nitroGLYCERIN (NITROSTAT) 0.4 MG SL tablet Place 1 tablet (0.4 mg total) under the tongue every 5 (five) minutes x 3 doses as needed for chest pain. 08/29/14   Charolette Forward, MD  Omega-3 Fatty Acids (FISH OIL) 1000 MG CAPS Take 1 capsule by mouth See admin instructions. Take 8 tables a day to lower triglycerides per list    Historical Provider, MD  oxyCODONE-acetaminophen (PERCOCET/ROXICET) 5-325 MG per tablet Take 2 tablets by mouth every 4 (four) hours. Take eight tablets daily as needed for pain per family and list. For bad  Back pain    Historical Provider, MD  potassium chloride SA (K-DUR,KLOR-CON) 20 MEQ tablet Take 1 tablet (20 mEq total) by mouth 2 (two) times daily. 02/27/16   Kathie Dike, MD  sertraline (ZOLOFT) 100 MG tablet Take 100 mg by mouth daily.    Historical Provider, MD  terazosin (HYTRIN) 2 MG capsule Take 4 mg by mouth every evening.    Historical Provider, MD  tiotropium (SPIRIVA) 18 MCG inhalation capsule Place 18 mcg into inhaler and inhale at bedtime.     Historical Provider, MD  vitamin B-12 (CYANOCOBALAMIN) 1000 MCG tablet Take 1,000 mcg by mouth daily.    Historical Provider, MD   BP 142/57 mmHg  Pulse 78  Temp(Src) 98.2 F (36.8 C) (Oral)  Resp 15  Ht 6' (1.829 m)  Wt 302 lb 4 oz (137.1 kg)  BMI 40.98 kg/m2  SpO2 99% Physical Exam  Constitutional: He appears well-developed and well-nourished. No distress.  Awake, alert, nontoxic appearance  HENT:  Head: Normocephalic and atraumatic.  Mouth/Throat: Oropharynx  is clear and moist. No oropharyngeal exudate.  Eyes: Conjunctivae are normal. No scleral icterus.  Neck: Normal range of motion. Neck supple.  Cardiovascular: Normal rate, regular rhythm and intact distal pulses.   Pulmonary/Chest: Effort normal and breath sounds normal. No respiratory distress. He has no wheezes.  Equal chest expansion  Abdominal: Soft. Bowel sounds are normal. He exhibits no mass. There is no tenderness. There is no rebound and no guarding.  Musculoskeletal: Normal range of motion. He exhibits no edema.  Neurological: He is alert. No cranial nerve deficit. Coordination normal.  Speech is clear and goal oriented Moves extremities without ataxia R sided drift Expressive and receptive aphasia  Skin:  Skin is warm and dry. He is not diaphoretic.  Psychiatric: He has a normal mood and affect.  Nursing note reviewed.   ED Course  Procedures (including critical care time)  DIAGNOSTIC STUDIES: Oxygen Saturation is 99% on RA, normal by my interpretation.    COORDINATION OF CARE: 11:32 PM Discussed treatment plan with pt at bedside which includes possible TPA and pt agreed to plan.  Labs Review Labs Reviewed  CBC - Abnormal; Notable for the following:    WBC 11.6 (*)    All other components within normal limits  DIFFERENTIAL - Abnormal; Notable for the following:    Neutro Abs 9.1 (*)    All other components within normal limits  COMPREHENSIVE METABOLIC PANEL - Abnormal; Notable for the following:    Sodium 130 (*)    Potassium 6.1 (*)    Chloride 92 (*)    Glucose, Bld 113 (*)    BUN 51 (*)    Creatinine, Ser 2.14 (*)    GFR calc non Af Amer 31 (*)    GFR calc Af Amer 36 (*)    All other components within normal limits  I-STAT CHEM 8, ED - Abnormal; Notable for the following:    Sodium 134 (*)    Potassium 5.9 (*)    Chloride 95 (*)    BUN 48 (*)    Creatinine, Ser 2.10 (*)    Glucose, Bld 110 (*)    All other components within normal limits  ETHANOL   PROTIME-INR  APTT  URINE RAPID DRUG SCREEN, HOSP PERFORMED  URINALYSIS, ROUTINE W REFLEX MICROSCOPIC (NOT AT [facility])  I-STAT TROPOININ, ED    Imaging Review Ct Head Code Stroke Wo Contrast`  03/16/2016  CLINICAL DATA:  Sudden onset right-sided weakness EXAM: CT HEAD WITHOUT CONTRAST TECHNIQUE: Contiguous axial images were obtained from the base of the skull through the vertex without intravenous contrast. COMPARISON:  None available (02/17/2014) FINDINGS: Brain: No evidence of acute infarction, hemorrhage, hydrocephalus, or mass lesion/mass effect. Aspects is 10. Vascular: No hyperdense vessel . Atherosclerotic calcification in the vertebral arteries and carotid siphons. Skull: Negative for fracture or focal lesion. Sinuses/Orbits: No acute finding. Other: These results were called by telephone at the time of interpretation on 03/16/2016 at 10:40 pm to Dr. Noemi Chapel , who verbally acknowledged these results. IMPRESSION: No acute finding or change compared to 2015.  Aspects is 10. Electronically Signed   By: Monte Fantasia M.D.   On: 03/16/2016 22:40   I have personally reviewed and evaluated these images and lab results as part of my medical decision-making.   EKG Interpretation   Date/Time:  Tuesday March 16 2016 23:05:46 EDT Ventricular Rate:  80 PR Interval:    QRS Duration: 122 QT Interval:  368 QTC Calculation: 425 R Axis:   95 Text Interpretation:  Sinus rhythm Nonspecific intraventricular conduction  delay Borderline repolarization abnormality since last tracing no  significant change Confirmed by Kelin Borum  MD, Markeese Boyajian ([id number]) on 03/16/2016  11:31:16 PM      MDM   Final diagnoses:  Acute ischemic stroke ([place])  Hyperkalemia  AKI (acute kidney injury) ([place])    [place] talked with wife -  They are agreeable to TPA LSN was 10:16 PM per EMS [facility] gave rec's at 11:05 PM CT neg for acute hemorrhage. D/w Dr. Shon Hale - will accept to ICU [facility] d/w wife who agreed to TPA,  CT neg Labs  with ARF (has had increased lasix this week as well as  on K and has elevated K) Fluids given  ,CRITICAL CARE Performed by: Johnna Acosta Total critical care time: 35 minutes Critical care time was exclusive of separately billable procedures and treating other patients. Critical care was necessary to treat or prevent imminent or life-threatening deterioration. Critical care was time spent personally by me on the following activities: development of treatment plan with patient and/or surrogate as well as nursing, discussions with consultants, evaluation of patient's response to treatment, examination of patient, obtaining history from patient or surrogate, ordering and performing treatments and interventions, ordering and review of laboratory studies, ordering and review of radiographic studies, pulse oximetry and re-evaluation of patient's condition.  I personally performed the services described in this documentation, which was scribed in my presence. The recorded information has been reviewed and is accurate.     Noemi Chapel, MD 03/17/16 [place], MD 03/17/16 [id number]

## 2016-03-16 NOTE — ED Notes (Signed)
Bolus in currently tpa infusing. Pt remains alert but sleepy- spouse says it is past his bedtime

## 2016-03-16 NOTE — ED Notes (Addendum)
Patient was having weakness at home and was unable to use his walker.  En route to the ER, patient started having symptoms of a stroke per EMS.  EMS states that he was having slurred speech.

## 2016-03-16 NOTE — ED Notes (Signed)
Dr Donivan Scull neuro

## 2016-03-16 NOTE — ED Notes (Signed)
From CT 

## 2016-03-16 NOTE — ED Notes (Signed)
Pt neuro exam by neuro- he has aphasia, as well as r arm drift. Per wife he has vision problem from his diabetes

## 2016-03-16 NOTE — ED Notes (Signed)
Tele neuro

## 2016-03-17 ENCOUNTER — Inpatient Hospital Stay (HOSPITAL_COMMUNITY): Payer: Medicare Other

## 2016-03-17 ENCOUNTER — Encounter (HOSPITAL_COMMUNITY): Payer: Non-veteran care

## 2016-03-17 DIAGNOSIS — I5030 Unspecified diastolic (congestive) heart failure: Secondary | ICD-10-CM

## 2016-03-17 DIAGNOSIS — E1165 Type 2 diabetes mellitus with hyperglycemia: Secondary | ICD-10-CM

## 2016-03-17 DIAGNOSIS — E785 Hyperlipidemia, unspecified: Secondary | ICD-10-CM

## 2016-03-17 DIAGNOSIS — E875 Hyperkalemia: Secondary | ICD-10-CM

## 2016-03-17 DIAGNOSIS — R739 Hyperglycemia, unspecified: Secondary | ICD-10-CM

## 2016-03-17 DIAGNOSIS — N179 Acute kidney failure, unspecified: Secondary | ICD-10-CM

## 2016-03-17 DIAGNOSIS — I952 Hypotension due to drugs: Secondary | ICD-10-CM

## 2016-03-17 DIAGNOSIS — E1159 Type 2 diabetes mellitus with other circulatory complications: Secondary | ICD-10-CM

## 2016-03-17 DIAGNOSIS — E860 Dehydration: Secondary | ICD-10-CM

## 2016-03-17 LAB — BASIC METABOLIC PANEL
ANION GAP: 10 (ref 5–15)
Anion gap: 10 (ref 5–15)
BUN: 41 mg/dL — AB (ref 6–20)
BUN: 38 mg/dL — AB (ref 6–20)
CALCIUM: 9 mg/dL (ref 8.9–10.3)
CALCIUM: 9.2 mg/dL (ref 8.9–10.3)
CO2: 26 mmol/L (ref 22–32)
CO2: 26 mmol/L (ref 22–32)
CREATININE: 1.62 mg/dL — AB (ref 0.61–1.24)
Chloride: 95 mmol/L — ABNORMAL LOW (ref 101–111)
Chloride: 98 mmol/L — ABNORMAL LOW (ref 101–111)
Creatinine, Ser: 1.42 mg/dL — ABNORMAL HIGH (ref 0.61–1.24)
GFR calc Af Amer: 50 mL/min — ABNORMAL LOW (ref >60)
GFR calc Af Amer: 58 mL/min — ABNORMAL LOW (ref >60)
GFR, EST NON AFRICAN AMERICAN: 43 mL/min — AB (ref >60)
GFR, EST NON AFRICAN AMERICAN: 50 mL/min — AB (ref >60)
GLUCOSE: 301 mg/dL — AB (ref 65–99)
GLUCOSE: 157 mg/dL — AB (ref 65–99)
POTASSIUM: 5.9 mmol/L — AB (ref 3.5–5.1)
Potassium: 4.6 mmol/L (ref 3.5–5.1)
SODIUM: 131 mmol/L — AB (ref 135–145)
Sodium: 134 mmol/L — ABNORMAL LOW (ref 135–145)

## 2016-03-17 LAB — RAPID URINE DRUG SCREEN, HOSP PERFORMED
AMPHETAMINES: NOT DETECTED
BARBITURATES: NOT DETECTED
BENZODIAZEPINES: NOT DETECTED
Cocaine: NOT DETECTED
Opiates: NOT DETECTED
Tetrahydrocannabinol: NOT DETECTED

## 2016-03-17 LAB — LIPID PANEL
Cholesterol: 168 mg/dL (ref 0–200)
HDL: 26 mg/dL — AB (ref >40)
LDL CALC: UNDETERMINED mg/dL (ref 0–99)
Total CHOL/HDL Ratio: 6.5 RATIO
Triglycerides: 561 mg/dL — ABNORMAL HIGH (ref <150)
VLDL: UNDETERMINED mg/dL (ref 0–40)

## 2016-03-17 LAB — URINALYSIS, ROUTINE W REFLEX MICROSCOPIC
BILIRUBIN URINE: NEGATIVE
GLUCOSE, UA: 500 mg/dL — AB
Ketones, ur: 15 mg/dL — AB
Leukocytes, UA: NEGATIVE
Nitrite: NEGATIVE
Protein, ur: >300 mg/dL — AB
SPECIFIC GRAVITY, URINE: 1.015 (ref 1.005–1.030)
pH: 6 (ref 5.0–8.0)

## 2016-03-17 LAB — GLUCOSE, CAPILLARY
GLUCOSE-CAPILLARY: 261 mg/dL — AB (ref 65–99)
GLUCOSE-CAPILLARY: 317 mg/dL — AB (ref 65–99)
Glucose-Capillary: 315 mg/dL — ABNORMAL HIGH (ref 65–99)
Glucose-Capillary: 182 mg/dL — ABNORMAL HIGH (ref 65–99)
Glucose-Capillary: 168 mg/dL — ABNORMAL HIGH (ref 65–99)

## 2016-03-17 LAB — URINE MICROSCOPIC-ADD ON

## 2016-03-17 MED ORDER — ACETAMINOPHEN 650 MG RE SUPP: 650.0000 mg | RECTAL | Status: DC | PRN

## 2016-03-17 MED ORDER — ONDANSETRON HCL 4 MG/2ML IJ SOLN
4.0000 mg | Freq: Three times a day (TID) | INTRAMUSCULAR | Status: DC | PRN
  Administered 2016-03-17: 4 mg via INTRAVENOUS
  Filled 2016-03-17: qty 2

## 2016-03-17 MED ORDER — SERTRALINE HCL 100 MG PO TABS
100.0000 mg | ORAL_TABLET | Freq: Every day | ORAL | Status: DC
  Administered 2016-03-17 – 2016-03-23 (×7): 100 mg via ORAL
  Filled 2016-03-17 (×2): qty 1
  Filled 2016-03-17: qty 2
  Filled 2016-03-17 (×4): qty 1

## 2016-03-17 MED ORDER — INSULIN ASPART 100 UNIT/ML ~~LOC~~ SOLN: 15.0000 [IU] | Freq: Three times a day (TID) | SUBCUTANEOUS | Status: DC

## 2016-03-17 MED ORDER — INSULIN GLARGINE 100 UNIT/ML ~~LOC~~ SOLN
30.0000 [IU] | Freq: Every day | SUBCUTANEOUS | Status: DC
  Administered 2016-03-17 – 2016-03-19 (×4): 30 [IU] via SUBCUTANEOUS
  Filled 2016-03-17 (×3): qty 0.3

## 2016-03-17 MED ORDER — POTASSIUM CHLORIDE CRYS ER 20 MEQ PO TBCR: 20.0000 meq | EXTENDED_RELEASE_TABLET | Freq: Two times a day (BID) | ORAL | Status: DC

## 2016-03-17 MED ORDER — INSULIN GLARGINE 100 UNIT/ML ~~LOC~~ SOLN: 30.0000 [IU] | SUBCUTANEOUS | Status: DC

## 2016-03-17 MED ORDER — ONDANSETRON HCL 4 MG/2ML IJ SOLN: 4.0000 mg | Freq: Three times a day (TID) | INTRAMUSCULAR | Status: DC | PRN

## 2016-03-17 MED ORDER — INSULIN ASPART 100 UNIT/ML ~~LOC~~ SOLN
0.0000 [IU] | Freq: Three times a day (TID) | SUBCUTANEOUS | Status: DC
  Administered 2016-03-17 (×2): 3 [IU] via SUBCUTANEOUS
  Administered 2016-03-18: 5 [IU] via SUBCUTANEOUS
  Administered 2016-03-18: 3 [IU] via SUBCUTANEOUS
  Administered 2016-03-18: 8 [IU] via SUBCUTANEOUS
  Administered 2016-03-18: 3 [IU] via SUBCUTANEOUS
  Administered 2016-03-19 (×3): 5 [IU] via SUBCUTANEOUS
  Administered 2016-03-19 – 2016-03-20 (×2): 3 [IU] via SUBCUTANEOUS
  Administered 2016-03-20: 8 [IU] via SUBCUTANEOUS
  Administered 2016-03-20: 5 [IU] via SUBCUTANEOUS
  Administered 2016-03-21: 3 [IU] via SUBCUTANEOUS
  Administered 2016-03-21: 15 [IU] via SUBCUTANEOUS
  Administered 2016-03-21: 5 [IU] via SUBCUTANEOUS
  Administered 2016-03-22: 8 [IU] via SUBCUTANEOUS
  Administered 2016-03-23 (×2): 5 [IU] via SUBCUTANEOUS

## 2016-03-17 MED ORDER — SENNOSIDES-DOCUSATE SODIUM 8.6-50 MG PO TABS
1.0000 | ORAL_TABLET | Freq: Every evening | ORAL | Status: DC | PRN
  Administered 2016-03-18: 1 via ORAL
  Filled 2016-03-17: qty 1

## 2016-03-17 MED ORDER — CLOPIDOGREL BISULFATE 75 MG PO TABS
75.0000 mg | ORAL_TABLET | Freq: Every day | ORAL | Status: DC
  Administered 2016-03-18 – 2016-03-23 (×6): 75 mg via ORAL
  Filled 2016-03-17 (×8): qty 1

## 2016-03-17 MED ORDER — ONDANSETRON HCL 4 MG/2ML IJ SOLN: 4.0000 mg | Freq: Four times a day (QID) | INTRAMUSCULAR | Status: DC | PRN

## 2016-03-17 MED ORDER — MAGNESIUM OXIDE 400 (241.3 MG) MG PO TABS
400.0000 mg | ORAL_TABLET | Freq: Every day | ORAL | Status: DC
  Administered 2016-03-17 – 2016-03-23 (×7): 400 mg via ORAL
  Filled 2016-03-17 (×7): qty 1

## 2016-03-17 MED ORDER — OXYCODONE-ACETAMINOPHEN 5-325 MG PO TABS
2.0000 | ORAL_TABLET | Freq: Four times a day (QID) | ORAL | Status: DC
  Administered 2016-03-17 – 2016-03-23 (×24): 2 via ORAL
  Filled 2016-03-17 (×24): qty 2

## 2016-03-17 MED ORDER — GLIPIZIDE 10 MG PO TABS
10.0000 mg | ORAL_TABLET | Freq: Every day | ORAL | Status: DC
  Administered 2016-03-18 – 2016-03-23 (×5): 10 mg via ORAL
  Filled 2016-03-17 (×3): qty 2
  Filled 2016-03-17: qty 1
  Filled 2016-03-17 (×2): qty 2

## 2016-03-17 MED ORDER — GEMFIBROZIL 600 MG PO TABS
600.0000 mg | ORAL_TABLET | Freq: Two times a day (BID) | ORAL | Status: DC
  Administered 2016-03-17 – 2016-03-21 (×9): 600 mg via ORAL
  Filled 2016-03-17 (×13): qty 1

## 2016-03-17 MED ORDER — FAMOTIDINE IN NACL 20-0.9 MG/50ML-% IV SOLN
20.0000 mg | Freq: Two times a day (BID) | INTRAVENOUS | Status: DC
  Administered 2016-03-17 (×2): 20 mg via INTRAVENOUS
  Filled 2016-03-17 (×2): qty 50

## 2016-03-17 MED ORDER — CLEVIDIPINE BUTYRATE 0.5 MG/ML IV EMUL
0.0000 mg/h | INTRAVENOUS | Status: DC
  Administered 2016-03-17: 1 mg/h via INTRAVENOUS
  Administered 2016-03-17: 5 mg/h via INTRAVENOUS
  Filled 2016-03-17 (×2): qty 50

## 2016-03-17 MED ORDER — INSULIN ASPART 100 UNIT/ML ~~LOC~~ SOLN
0.0000 [IU] | SUBCUTANEOUS | Status: DC
  Administered 2016-03-17 (×2): 8 [IU] via SUBCUTANEOUS
  Administered 2016-03-17: 11 [IU] via SUBCUTANEOUS

## 2016-03-17 MED ORDER — METFORMIN HCL 500 MG PO TABS: 1000.0000 mg | ORAL_TABLET | Freq: Two times a day (BID) | ORAL | Status: DC

## 2016-03-17 MED ORDER — TIOTROPIUM BROMIDE MONOHYDRATE 18 MCG IN CAPS
18.0000 ug | ORAL_CAPSULE | Freq: Every day | RESPIRATORY_TRACT | Status: DC
  Administered 2016-03-17 – 2016-03-22 (×6): 18 ug via RESPIRATORY_TRACT
  Filled 2016-03-17 (×2): qty 5

## 2016-03-17 MED ORDER — OXYCODONE-ACETAMINOPHEN 5-325 MG PO TABS
2.0000 | ORAL_TABLET | ORAL | Status: DC
  Administered 2016-03-17: 2 via ORAL
  Filled 2016-03-17: qty 2

## 2016-03-17 MED ORDER — PANTOPRAZOLE SODIUM 40 MG PO TBEC
40.0000 mg | DELAYED_RELEASE_TABLET | Freq: Every day | ORAL | Status: DC
  Administered 2016-03-18 – 2016-03-23 (×6): 40 mg via ORAL
  Filled 2016-03-17 (×6): qty 1

## 2016-03-17 MED ORDER — FERROUS SULFATE 325 (65 FE) MG PO TABS
325.0000 mg | ORAL_TABLET | Freq: Three times a day (TID) | ORAL | Status: DC
  Administered 2016-03-17 – 2016-03-23 (×18): 325 mg via ORAL
  Filled 2016-03-17 (×19): qty 1

## 2016-03-17 MED ORDER — INSULIN GLARGINE 100 UNIT/ML ~~LOC~~ SOLN
60.0000 [IU] | Freq: Every day | SUBCUTANEOUS | Status: DC
  Administered 2016-03-17 – 2016-03-18 (×2): 60 [IU] via SUBCUTANEOUS
  Filled 2016-03-17 (×3): qty 0.6

## 2016-03-17 MED ORDER — SODIUM CHLORIDE 0.9 % IV SOLN
INTRAVENOUS | Status: DC
  Administered 2016-03-17 – 2016-03-18 (×2): via INTRAVENOUS

## 2016-03-17 MED ORDER — GLIPIZIDE 5 MG PO TABS
5.0000 mg | ORAL_TABLET | Freq: Two times a day (BID) | ORAL | Status: DC
  Administered 2016-03-17 – 2016-03-23 (×12): 5 mg via ORAL
  Filled 2016-03-17 (×10): qty 1

## 2016-03-17 MED ORDER — ACETAMINOPHEN 325 MG PO TABS
650.0000 mg | ORAL_TABLET | ORAL | Status: DC | PRN
  Administered 2016-03-17: 650 mg via ORAL
  Filled 2016-03-17: qty 2

## 2016-03-17 MED ORDER — LABETALOL HCL 5 MG/ML IV SOLN
20.0000 mg | Freq: Once | INTRAVENOUS | Status: AC
  Administered 2016-03-17: 20 mg via INTRAVENOUS
  Filled 2016-03-17: qty 4

## 2016-03-17 MED ORDER — INSULIN ASPART 100 UNIT/ML ~~LOC~~ SOLN: 30.0000 [IU] | Freq: Three times a day (TID) | SUBCUTANEOUS | Status: DC

## 2016-03-17 MED ORDER — ONDANSETRON HCL 4 MG/2ML IJ SOLN
INTRAMUSCULAR | Status: AC
  Filled 2016-03-17: qty 2

## 2016-03-17 MED ORDER — ATORVASTATIN CALCIUM 80 MG PO TABS
80.0000 mg | ORAL_TABLET | Freq: Every day | ORAL | Status: DC
  Administered 2016-03-17 – 2016-03-22 (×6): 80 mg via ORAL
  Filled 2016-03-17 (×6): qty 1

## 2016-03-17 MED ORDER — INSULIN ASPART 100 UNIT/ML ~~LOC~~ SOLN
30.0000 [IU] | Freq: Three times a day (TID) | SUBCUTANEOUS | Status: DC
  Administered 2016-03-17 – 2016-03-23 (×15): 30 [IU] via SUBCUTANEOUS

## 2016-03-17 MED ORDER — INSULIN ASPART 100 UNIT/ML ~~LOC~~ SOLN: 0.0000 [IU] | SUBCUTANEOUS | Status: DC

## 2016-03-17 MED ORDER — STROKE: EARLY STAGES OF RECOVERY BOOK
Freq: Once | Status: AC
  Administered 2016-03-17: 02:00:00
  Filled 2016-03-17: qty 1

## 2016-03-17 MED ORDER — ASPIRIN 81 MG PO CHEW
81.0000 mg | CHEWABLE_TABLET | Freq: Every day | ORAL | Status: DC
  Administered 2016-03-18 – 2016-03-23 (×7): 81 mg via ORAL
  Filled 2016-03-17 (×8): qty 1

## 2016-03-17 MED ORDER — SODIUM CHLORIDE 0.9 % IV BOLUS (SEPSIS)
500.0000 mL | Freq: Once | INTRAVENOUS | Status: AC
  Administered 2016-03-17: 500 mL via INTRAVENOUS

## 2016-03-17 MED ORDER — METOPROLOL TARTRATE 12.5 MG HALF TABLET
12.5000 mg | ORAL_TABLET | Freq: Two times a day (BID) | ORAL | Status: DC
  Administered 2016-03-17 – 2016-03-23 (×11): 12.5 mg via ORAL
  Filled 2016-03-17 (×12): qty 1

## 2016-03-17 MED ORDER — FUROSEMIDE 80 MG PO TABS
80.0000 mg | ORAL_TABLET | Freq: Two times a day (BID) | ORAL | Status: DC
  Administered 2016-03-17 – 2016-03-23 (×13): 80 mg via ORAL
  Filled 2016-03-17 (×13): qty 1

## 2016-03-17 MED ORDER — MUPIROCIN CALCIUM 2 % EX CREA
TOPICAL_CREAM | Freq: Every day | CUTANEOUS | Status: DC
  Administered 2016-03-17: 2 via TOPICAL
  Administered 2016-03-18 – 2016-03-22 (×5): via TOPICAL
  Filled 2016-03-17 (×2): qty 15

## 2016-03-17 NOTE — ED Notes (Signed)
[facility] arrival. 

## 2016-03-17 NOTE — Progress Notes (Signed)
PT Cancellation Note  Patient Details Name: Jose [name] MRN: [id number] DOB: [birth year]   Cancelled Treatment:    Reason Eval/Treat Not Completed: Patient not medically ready (active bedrest orders at this time)   Duncan Dull 03/17/2016, [place], [place] DPT  [phone]

## 2016-03-17 NOTE — Progress Notes (Signed)
Pt transfered  To 5M08.

## 2016-03-17 NOTE — Progress Notes (Signed)
STROKE TEAM PROGRESS NOTE   HISTORY OF PRESENT ILLNESS (per record) This is a 65-yo man who presented to [facility] ED via EMS after family found him with generalized weakness at home. The patient is aphasic and cannot provide any history. History is thus obtained from his wife and son who arrived in the ICU to visit.   His wife reports that he was in his usual state of health when she left the house this morning and when she spoke to him on the phone this afternoon. His son arrived home this evening around 1800 and heard the patient calling to him from the bathroom. He went in to check on him and noted that the patient seemed pale and was having difficulty standing up. He uses a walker at baseline due to severe neuropathy and chronic back pain. His son had him sit down for a minute and then helped him walk back to his bedroom. The patient required support to ambulate and his son states that he looked like was going to pass out. His wife arrived home shortly after this and called EMS. His family describes generalized weakness without focality and also note that he was communicating normally. En route to the ED he apparently had the abrupt onset of aphasia and right-sided weakness. Per ED MD note, he was found to have expressive and receptive aphasia as well as a right-sided drift. Teleneurology was contacted and the patient was felt to be having an acute stroke with reported initial NIHSS score of 2. He was deemed to be a candidate for tPA; bolus was given at 2336 with infusion started at 2337. Dr. Shon Hale was contacted and accepted the patient in transfer. During transport from OSH he was documented to have NIHSS score of 0.  Of note, his wife reports that he has significant vision problems related to his diabetes and has had laser treatment in one eye with plans for additional treatment at the [place]. He also reportedly has glaucoma for which he has an upcoming appointment. She also states that he has chronic  hearing loss.   He was recently admitted to [facility] from 02/24/16-02/27/16 for an acute diastolic CHF exacerbation. He was diuresed with IV furosemide and discharged on oral furosemide and fluid restriction to continue diuresing. He saw cardiology for follow-up on 03/03/16 and was noted to still be volume overloaded so his furosemide was continued unchanged. He was taken off of Plavix and maintained on aspirin monotherapy during this visit.   SUBJECTIVE (INTERVAL HISTORY) His son and wife are at the bedside.  He is sitting on the edge of the bed eating breakfast. Overall he feels his condition is improved. Wife recounted history with me. He was recently discharged from hospital due to CHF, he was put on heavy diuresis with lasix 80mg  bid, as well lisinopril 40, metoprolol 25mg  bid and terazosin 4mg . Wife has discontinued norvasc as his BP tends to be low. His glucose always high although on high dose of insulin at home.   Yesterday, he felt generalized weakness, pale, not able to walk, glucose 91 and did not check BP. EMS called and en route reported aphasia and right hemiplegia. However, wife and son denied any focal neuro deficit. In ER, pt had normal speech and equal strength as per wife. He received tPA. This morning, he has no focal neuro deficit but seems encephalopathic with lethargy and asterixis. Complaining of LBP due to not offering pain meds which he has been on for 20 years.  Pt wife stated that he does have hx of carotid stenosis but no need surgery as per wife.   OBJECTIVE Temp:  [97.8 F (36.6 C)-98.7 F (37.1 C)] 98.5 F (36.9 C) (07/19 0300) Pulse Rate:  [77-118] 113 (07/19 0800) Cardiac Rhythm:  [-] Normal sinus rhythm (07/19 0051) Resp:  [4-28] 15 (07/19 0800) BP: (113-203)/(50-126) 168/63 mmHg (07/19 0800) SpO2:  [95 %-100 %] 97 % (07/19 0800) Weight:  [130.182 kg (287 lb)-137.1 kg (302 lb 4 oz)] 132.7 kg (292 lb 8.8 oz) (07/19 0100)  CBC:  Recent Labs Lab 03/16/16 2230  03/16/16 2300  WBC 11.6*  --   NEUTROABS 9.1*  --   HGB 15.6 17.0  HCT 45.6 50.0  MCV 88.4  --   PLT 195  --     Basic Metabolic Panel:  Recent Labs Lab 03/16/16 2230 03/16/16 2300  NA 130* 134*  K 6.1* 5.9*  CL 92* 95*  CO2 29  --   GLUCOSE 113* 110*  BUN 51* 48*  CREATININE 2.14* 2.10*  CALCIUM 9.7  --     Lipid Panel:    Component Value Date/Time   CHOL 168 03/17/2016 0453   TRIG 561* 03/17/2016 0453   HDL 26* 03/17/2016 0453   CHOLHDL 6.5 03/17/2016 0453   VLDL UNABLE TO CALCULATE IF TRIGLYCERIDE OVER 400 mg/dL 03/17/2016 0453   LDLCALC UNABLE TO CALCULATE IF TRIGLYCERIDE OVER 400 mg/dL 03/17/2016 0453   HgbA1c:  Lab Results  Component Value Date   HGBA1C 10.0* 08/27/2014   Urine Drug Screen:    Component Value Date/Time   LABOPIA NONE DETECTED 03/17/2016 0630   COCAINSCRNUR NONE DETECTED 03/17/2016 0630   LABBENZ NONE DETECTED 03/17/2016 0630   AMPHETMU NONE DETECTED 03/17/2016 0630   THCU NONE DETECTED 03/17/2016 0630   LABBARB NONE DETECTED 03/17/2016 0630      IMAGING I have personally reviewed the radiological images below and agree with the radiology interpretations.  Ct Head Code Stroke Wo Contrast 03/16/2016  IMPRESSION: No acute finding or change compared to 2015.  Aspects is 10.   MRI and MRA pending  CUS pending  2D echo - Left ventricle: Poor acoustic windows limit study Definity used  to optimize Overall LVEF appears normal at 55 to 60% The cavity  size was normal. Wall thickness was increased in a pattern of  moderate LVH. Doppler parameters are consistent with abnormal  left ventricular relaxation (grade 1 diastolic dysfunction). - Left atrium: The atrium was moderately dilated.   PHYSICAL EXAM  Temp:  [97.8 F (36.6 C)-98.7 F (37.1 C)] 98.5 F (36.9 C) (07/19 0800) Pulse Rate:  [77-118] 93 (07/19 1500) Resp:  [4-55] 17 (07/19 1500) BP: (108-203)/(50-126) 151/69 mmHg (07/19 1500) SpO2:  [91 %-100 %] 91 % (07/19  1500) Weight:  [287 lb (130.182 kg)-302 lb 4 oz (137.1 kg)] 292 lb 8.8 oz (132.7 kg) (07/19 0100)  General - Morbid obesity, well developed, lethargic and in acute lower back pain.  Ophthalmologic - Fundi not visualized due to acute distress.  Cardiovascular - Regular rate and rhythm.  Mental Status -  Level of arousal and orientation to time, place, and person were intact. Language including expression, naming, repetition, comprehension was assessed and found intact. Attention span and concentration were impaired with mild perseveration. Fund of Knowledge was assessed and was impaired.  Cranial Nerves II - XII - II - Visual field intact OU. III, IV, VI - Extraocular movements intact. V - Facial sensation intact bilaterally. VII -  Facial movement intact bilaterally. VIII - Hearing & vestibular intact bilaterally. X - Palate elevates symmetrically. XI - Chin turning & shoulder shrug intact bilaterally. XII - Tongue protrusion intact.  Motor Strength - The patient's strength was 4+/5 in all extremities and pronator drift was absent.  Bulk was normal and fasciculations were absent.   Motor Tone - Muscle tone was assessed at the neck and appendages and was normal.  Reflexes - The patient's reflexes were 1+ in all extremities and he had no pathological reflexes.  Sensory - Light touch, temperature/pinprick were assessed and were symmetrical.    Coordination - The patient had normal movements in the hands with no ataxia or dysmetria.  Tremor was absent, however, b/l asterixis with R>L.  Gait and Station - not tested due to acute distress.   ASSESSMENT/PLAN Mr. ELLIOTTE VANCE is a 65 y.o. male with history of HTN, DM, CAD, HLD, obestiy, OSA, carotid stenosis, CHF and COPD who presented with generalized weakness and near syncope but developed aphasia and right-sided weakness in ambulance on way to [facility]. At [facility], he received IV t-PA 03/16/2016 at 2336. He was transferred to [facility]  Metabolic  encephalopathy, near syncope,  Hypo- and hyperglycemia, AKI  Initial symptoms with generalized weakness, pale and near passing out  Did not check BP at that time  CBG 91 at the time of onset which was low for pt  BP and glucose trending up during admission  Still has lethargy, mild perseveration and b/l asterixis  After admission, pt has hyperglycemia, hyponatremia, hyperkalemia  AKI with elevated Cre from baseline, likely due to over diuresis  Presumed L brain Stroke/TIA with suspicious hypotension in the setting know carotid stenosis, s/p IV tPA  Resultant  Expressive aphasia and right hemiplegia resolved  MRI  pending   MRA  pending   Carotid Doppler  pending   2D Echo EF 55-60%   LDL unable to calculate due to TG 561  HgbA1c pending  SCDs for VTE prophylaxis  Diet Carb Modified Fluid consistency:: Thin; Room service appropriate?: Yes  aspirin 81 mg daily prior to admission (taken off  plavix 2 weeks ago per cardiology), now on No antithrombotic as within 24h of tPA. Plan to resume aspirin and add plavix after 24h if imaging negative for hemorrhage   Ongoing aggressive stroke risk factor management  Therapy recommendations:  Pending. Ok to be OOB  Disposition:  pending (lives with family PTA)  CHF   Recently discharged for CHF and fluid overload  Followed with Dr. Harl Bowie on 03/03/16  On home meds with lasix 80mg  bid, metoprolol 25mg  bid, lisinopril 40mg  daily, amlodipine 2.5mg  and terazosin 4mg   EF 55-60% this admission  Due to concerns of BP dependent stroke/TIA event, will hold off lisinopril, amlodipine and terazosin, resume half dose metoprolol but full dose of lasix  Cardiology consult if needed  Supine hypertension  BP 198/78 on arrival  On metoprolol 25 bid, norvasc 2.5 qd, lisinopril 40 qd, terazosin 2 mg q hs and lasix 20 mg daily  resumed lasix 80 g daily  Resume half dose metoprolol 12.5 mg bid  Hold other meds  Wean cleviprex  BP  Stable currently at 151/69  BP has been running low at home   Long-term BP goal 130-150 due to ICA stenosis  L ICA occlusion  Known to pt, no intervention  Carotid doppler pending   BP goal 130-150 due to ICA stenosis  Hyperlipidemia  Home meds:  lipitor 80  LDL unable  to calculate, goal < 70  Resume home statin  Continue statin at discharge  Diabetes  HgbA1c pending, goal < 7.0  uncontrolled  Resume home diabetes medications - high dose insulin  SSI  CBG monitoring  Other Stroke Risk Factors  Advanced age  Former Cigarette smoker  Morbid obesity, Body mass index is 39.67 kg/(m^2)., recommend weight loss, diet and exercise as appropriate   Coronary artery disease - previous cath 07/2014 with LM patent, LAD ostial 40-50%, LCX mld disease, RCA occluded filling by collaterals. Unsuccesful attempt at RCA PCI.  - on DAPT since 07/2014 - plavix stopped  - Dr. Harl Bowie following  OSA on CPAP at home  Other Active Problems  COPD on home O2 at hs  Chronic back pain on Vicodin, has used x 20 years  Hyperkalemia, K 6.1->5.9. On 20 meg bid at home. [facility] day # 1  This patient is critically ill due to transient aphasia and right hemiplegia, with multiple comorbidities including CHF, uncontrolled DM, hyperkalemia, AKI, encephalopathy, HLD and at significant risk of neurological worsening, death form recurrent stroke, heart failure, DKA, cardiac arrest. This patient's care requires constant monitoring of vital signs, hemodynamics, respiratory and cardiac monitoring, review of multiple databases, neurological assessment, discussion with family, other specialists and medical decision making of high complexity. I spent 50 minutes of neurocritical care time in the care of this patient.  Rosalin Hawking, MD PhD Stroke Neurology 03/17/2016 5:04 PM   To contact Stroke Continuity provider, please refer to [email or web address] After hours, contact General Neurology

## 2016-03-17 NOTE — Consult Note (Signed)
Neurology Consult Note  Reason for Consultation: CODE STROKE, s/p tPA at [facility]   Requesting provider: Guadlupe Spanish, MD  CC: Patient is aphasic and unable to provide  HPI: This is a 14-yo man who presented to [facility] ED via EMS after family found him with generalized weakness at home. The patient is aphasic and cannot provide any history. History is thus obtained from his wife and son who arrived in the ICU to visit. I have also reviewed his medical records and briefly discussed the case with Dr. Sabra Heck via telephone.   His wife reports that he was in his usual state of health when she left the house this morning and when she spoke to him on the phone this afternoon. His son arrived home this evening around 1800 and heard the patient calling to him from the bathroom. He went in to check on him and noted that the patient seemed pale and was having difficulty standing up. He uses a walker at baseline due to severe neuropathy and chronic back pain. His son had him sit down for a minute and then helped him walk back to his bedroom. The patient required support to ambulate and his son states that he looked like was going to pass out. His wife arrived home shortly after this and called EMS. His family describes generalized weakness without focality and also note that he was communicating normally. En route to the ED he apparently had the abrupt onset of aphasia and right-sided weakness. Per ED MD note, he was found to have expressive and receptive aphasia as well as a right-sided drift. Teleneurology was contacted and the patient was felt to be having an acute stroke with reported initial NIHSS score of 2. He was deemed to be a candidate for tPA; bolus was given at 2336 with infusion started at 2337. I was contacted and accepted the patient in transfer. During transport from OSH he was documented to have NIHSS score of 0.   Of note, his wife reports that he has significant vision problems  related to his diabetes and has had laser treatment in one eye with plans for additional treatment at the [place]. He also reportedly has glaucoma for which he has an upcoming appointment. She also states that he has chronic hearing loss.   He was recently admitted to [facility] from 02/24/16-02/27/16 for an acute diastolic CHF exacerbation. He was diuresed with IV furosemide and discharged on oral furosemide and fluid restriction to continue diuresing. He saw cardiology for follow-up on 03/03/16 and was noted to still be volume overloaded so his furosemide was continued unchanged. He was taken off of Plavix and maintained on aspirin monotherapy during this visit.   PMH:  Past Medical History  Diagnosis Date  . Hypertension   . Sleep apnea   . COPD (chronic obstructive pulmonary disease) ([place])   . Diabetes mellitus ([place])   . Neuropathy ([place])   . CAD (coronary artery disease)     a. prior silent inferior MI, NSTEMI 07/2014 with chronically occluded RCA s/p unsuccessful PCI with otherwise nonobstructive residual disease  . Morbid obesity ([place])   . Former tobacco use   . Hyperlipidemia   . Chronic respiratory failure ([place])     a. Placed on home O2 01/2016.    PSH:  Past Surgical History  Procedure Laterality Date  . Back surgery    . Left heart catheterization with coronary angiogram N/A 08/28/2014    Procedure: LEFT HEART CATHETERIZATION WITH  CORONARY ANGIOGRAM;  Surgeon: Clent Demark, MD;  Location: [facility] CATH LAB;  Service: Cardiovascular;  Laterality: N/A;    Family history: He is unable to provide.   Social history:  He is married and lives with his wife and son. He is a former smoker with a 135 pack-year history but quit some time ago. He reportedly does not drink any alcohol. He is retired.    Current outpatient meds: No outpatient prescriptions have been marked as taking for the 03/16/16 encounter [facility] Encounter).    Current inpatient meds:  Current Facility-Administered  Medications  Medication Dose Route Frequency Provider Last Rate Last Dose  .  stroke: mapping our early stages of recovery book   Does not apply Once Darrel Reach, MD      . 0.9 %  sodium chloride infusion  50 mL Intravenous Once Noemi Chapel, MD      . 0.9 %  sodium chloride infusion   Intravenous Continuous Darrel Reach, MD      . acetaminophen (TYLENOL) tablet 650 mg  650 mg Oral Q4H PRN Darrel Reach, MD       Or  . acetaminophen (TYLENOL) suppository 650 mg  650 mg Rectal Q4H PRN Darrel Reach, MD      . labetalol (NORMODYNE,TRANDATE) injection 20 mg  20 mg Intravenous Once Darrel Reach, MD       And  . clevidipine (CLEVIPREX) infusion 0.5 mg/mL  0-21 mg/hr Intravenous Continuous Darrel Reach, MD      . famotidine (PEPCID) IVPB 20 mg premix  20 mg Intravenous Q12H Darrel Reach, MD      . ondansetron [facility]) injection 4 mg  4 mg Intravenous Q8H PRN Noemi Chapel, MD      . senna-docusate (Senokot-S) tablet 1 tablet  1 tablet Oral QHS PRN Darrel Reach, MD        Allergies: No Known Allergies  ROS: As per HPI. A full 14-point review of systems was performed and is limited by some aphasia. He does complain of back pain, however.   PE:  BP 163/67 mmHg  Pulse 89  Temp(Src) 98.7 F (37.1 C) (Axillary)  Resp 17  Ht 6' (1.829 m)  Wt 132.7 kg (292 lb 8.8 oz)  BMI 39.67 kg/m2  SpO2 100%  General: WD obese Caucasian man lying in hospital bed. He appears mildly uncomfortable, occasionally grimacing., Speech is mildly dysarthric. He has a mild global aphasia with greater difficulty comprehending more complex commands, particularly multistep and cross-body commands. He has a tendency to answer most questions with "I don't really know."  HEENT: Normocephalic. Neck supple without LAD. MMM, OP slightly dry. Sclerae anicteric. Mild conjunctival injection.  CV: Regular, no murmur. Carotid pulses full and symmetric, no bruits. Distal pulses 1+  and symmetric.  Lungs: CTAB on anterior exam.  Abdomen: Soft, obese, non-distended, non-tender. Bowel sounds present x4.  Extremities: No C/C. He has induration and erythema both lower legs and feet. There is trace edema. He has a dressing in place over a wound on the left sole.  Neuro:  CN: Pupils are equal and round. They are symmetrically reactive from 3-->2 mm. EOMI with breakup of smooth pursuits, no nystagmus. Corneals are present bilaterally. There may be some subtle flattening of the right nasolabial fold but he has normal strength and mobility. Bilateral SCM and trapezii are 5/5. Tongue is midline with normal bulk and mobility.  Motor: Normal bulk, tone. Strength is grossly normal but  confrontational testing is somewhat limited by his aphasia. No tremor or other abnormal movements. No drift.  Sensation: Appears intact to light touch and mild noxious stimuli.  DTRs: 2+, symmetric in the arms, absent in the legs. Toes mute bilaterally.  Coordination: Finger-to-nose slow but without dysmetria.   Labs:  Lab Results  Component Value Date   WBC 11.6* 03/16/2016   HGB 17.0 03/16/2016   HCT 50.0 03/16/2016   PLT 195 03/16/2016   GLUCOSE 110* 03/16/2016   CHOL 113 02/26/2016   TRIG 265* 02/26/2016   HDL 26* 02/26/2016   LDLCALC 34 02/26/2016   ALT 30 03/16/2016   AST 28 03/16/2016   NA 134* 03/16/2016   K 5.9* 03/16/2016   CL 95* 03/16/2016   CREATININE 2.10* 03/16/2016   BUN 48* 03/16/2016   CO2 29 03/16/2016   TSH 0.707 08/27/2014   INR 1.01 03/16/2016   HGBA1C 10.0* 08/27/2014   PTT 28 Serum ethanol <5   Imaging:  I have personally and independently reviewed the [facility] without contrast from 03/16/16. This shows a mild burden of chronic small vessel ischemic disease with no apparent acute pathology.   TTE 02/26/16: LVEF 55-60%; moderate LVH; grade 1 diastolic dysfunction; moderately dilated left atrium  Assessment and Plan:  1. Acute ischemic stroke: This is an acute stroke  most likely involving the L MCA territory given symptom. Known risk factors for cerebrovascular disease in this patient include DM, HTN, CAD with h/o MI, obesity, dyslipidemia, and OSA. Additional workup will be ordered to include MRI brain, MRA of the head, carotid Dopplers, fasting lipids, and hemoglobin a1c. TTE will be deferred given recent echo on 02/26/16. He is s/p tPA with infusion completed at [facility] on 03/17/16. he will be admitted to the ICU for close monitoring after tPA. He will have frequent neuro checks per protocol. Repeat CTH without contrast in AM post-tPA. No antiplatelet agents or anticoagulation for 24 hours post-tPA. No invasive procedures (NG tube placement, Foley, central lines) for 24 hours post-tPA. I will bolus him with 500 mL NS given elevated creatinine in setting of heavy diuresis. Fluid status will need to be followed closely, balancing cerebral perfusion with volume overload in the setting of diastolic CHF. Resume statin with goal LDL less than 70. Ensure adequate glucose control. Avoid fever and hyperglycemia as these can extend the infarct. Avoid hypotonic IVF to minimize exacerbation of post-stroke edema. Initiate rehab services. DVT prophylaxis with SCDs for now.  2. Right hemiparesis: This was noted on presentation to the outside ED but has now resolved for the most part. Follow. PT/OT to eval and treat as needed.   3. Aphasia: He has a global aphasia on exam. This is acute and due to stroke. ST to eval and treat.  4. Dysarthria: He has mildly slurred speech, due to stroke. NPO until cleared with swallow eval.   5. Peripheral neuropathy: This is severe and chronic, most likely due to his DM. His wife reports that he uses a walker at baseline because of neuropathy and back problems. Will need PT to assess needs post-stroke.   This was discussed with the patient's wife and son at the bedside. They were given the opportunity to ask questions and these were addressed to their  satisfaction. The stroke service will assume care of the patient moving forward.   A total of 65 minutes critical care time was spent on this case.

## 2016-03-17 NOTE — Progress Notes (Signed)
PT Cancellation Note  Patient Details Name: Jose [name] MRN: [id number] DOB: 25-Mar-1951   Cancelled Treatment:    Reason Eval/Treat Not Completed: Patient not medically ready (active bedrest orders at this time).  Pt still on bedrest. Stroke NP paged to inquire about appropriateness of mobilizing today.  Await response.   Thanks,    Barbarann Ehlers. [place], [place], DPT [phone]   03/17/2016, 9:20 AM

## 2016-03-17 NOTE — Progress Notes (Signed)
OT NOTE   OT order received and appreciated however this conflicts with current bedrest order set. Please increase activity tolerance as appropriate and remove bedrest from orders. . Please contact OT at [phone] if bed rest order is discontinued. OT will hold evaluation at this time and will check back as time allows pending increased activity orders.    Jeri Modena   OTR/L Pager: [phone] Office: [phone] .

## 2016-03-17 NOTE — Consult Note (Signed)
[place] Nurse wound consult note Reason for Consult: Consult requested for bilat feet.  Pt states he "used to be followed by a foot Dr at the [place] but has not gone in awhile." Wound type: Left outer foot with full thickness wound; .5X1X.2cm, 90% yellow wound bed, 10% red, surrounded by dry raised callous to wound edges.  No odor, small amt yellow drainage. Right plantar foot with dry raised callous; affected area approx 3X3cm.   Trimmed outer loose dry crumbling edges with scalpel until even with skin level.  Currently there is no open wound but callous edges beginning to lift away from skin in some sections, no odor or drainage.   Dressing procedure/placement/frequency: Bactroban to promote moist healing.  Discussed plan of care with patient and family members at the bedside and they verbalize understanding. Encouraged patient to resume follow-up with podiatrist after discharge. Please re-consult if further assistance is needed.  Thank-you,  Julien Girt MSN, [place], [place], [place], [place]

## 2016-03-17 NOTE — Progress Notes (Signed)
Pt got transfered from 8M , alert and oriented, pt settled in bed with call light and family at bedside, tele monitor put on pt, v/s stable, will continue to monitor. [place], [place] Crisanti Efe

## 2016-03-17 NOTE — Progress Notes (Signed)
CODE STROKE  BEEPER   1020PM IN CT    1025 OUT    1036  LAB DREW BLOOD RAD CALLED   1036 SOC    1028

## 2016-03-17 NOTE — ED Notes (Signed)
Family at bedside. 

## 2016-03-17 NOTE — Progress Notes (Signed)
Speech Language Pathology    Patient Details Name: Jose [name] MRN: [id number] DOB: 07/03/1951 Today's Date: 03/17/2016 Time:  -      Pt passed RN swallow screen at 0300 this morning. No report of difficulties per RN although he would benefit from cognitive-language assessment (not initially ordered, RN placed order).   Orbie Pyo [place].Ed [employer] [phone]

## 2016-03-17 NOTE — Evaluation (Signed)
Physical Therapy Evaluation Patient Details Name: Jose [name] MRN: [id number] DOB: [birth year] Today's Date: 03/17/2016   History of Present Illness  65 y.o. male admitted to [facility] on 03/16/16 for weakness and difficulty walking.   In the ambulance en route to the hospital he also started having difficulty speaking with R sided weakness. Dx with presumed L brain stroke s/p tPA. Stroke workup pending.  Pt with significant PMHx of HTN, COPD, DM, neuropathy, CAD, morbid obesity, low back surgery.    Clinical Impression  Pt was able to sit EOB and get OOB to chair with minimal assist and use of RW. He did not seem to have any asymmetrical strength in his LEs, but is a bit impulsive and groans and grunts due to chronic low back pain during mobility. He has a recent h/o falls and is at this point using a RW full time.  He would likely benefit from HHPT f/u for gait, balance, and strength training.  PT to follow acutely for deficits listed below.       Follow Up Recommendations Home health PT;Supervision/Assistance - 24 hour    Equipment Recommendations  Wheelchair (measurements PT);Wheelchair cushion (measurements PT) (20x20 WC)    Recommendations for Other Services   NA    Precautions / Restrictions Precautions Precautions: Fall Precaution Comments: pt, at baseline uses RW.  Remote h/o falls related to back pain.       Mobility  Bed Mobility Overal bed mobility: Needs Assistance Bed Mobility: Supine to Sit     Supine to sit: Supervision;HOB elevated     General bed mobility comments: supervision HOB elevated using railing.   Transfers Overall transfer level: Needs assistance Equipment used: Rolling walker (2 wheeled) Transfers: Sit to/from Stand Sit to Stand: Min assist         General transfer comment: Min assist to steady trunk during transitions, uncontrolled "plop" descent to sit in lower recliner chair. Verbal cues for safe hand placement.    Ambulation/Gait Ambulation/Gait assistance: Min assist Ambulation Distance (Feet): 3 Feet Assistive device: Rolling walker (2 wheeled) Gait Pattern/deviations: Step-through pattern;Shuffle;Trunk flexed     General Gait Details: Pt with very stiff legged and stiff trunk movements from bed to recliner chair. He was leaning heavily on RW for support.  Min assist for safety and balance when on his feet.       Modified Rankin (Stroke Patients Only) Modified Rankin (Stroke Patients Only) Pre-Morbid Rankin Score: Moderately severe disability Modified Rankin: Moderately severe disability     Balance Overall balance assessment: Needs assistance Sitting-balance support: Feet supported;No upper extremity supported Sitting balance-Leahy Scale: Fair     Standing balance support: Bilateral upper extremity supported Standing balance-Leahy Scale: Poor                               Pertinent Vitals/Pain Pain Assessment: 0-10 Pain Score: 6  Pain Location: low back (chronic) Pain Descriptors / Indicators: Sore Pain Intervention(s): Limited activity within patient's tolerance;Monitored during session;Premedicated before session;Repositioned    Home Living Family/patient expects to be discharged to:: Private residence Living Arrangements: Spouse/significant other Available Help at Discharge: Family;Available 24 hours/day Type of Home: House Home Access: Stairs to enter Entrance Stairs-Rails: None Entrance Stairs-Number of Steps: 1 Home Layout: One level Home Equipment: Walker - 2 wheels      Prior Function Level of Independence: Needs assistance   Gait / Transfers Assistance Needed: wife reports over the past few weeks  she has felt that she needs to supervise him when he is up on his feet.   ADL's / Homemaking Assistance Needed: per wife assist needed at times for ADLs        Hand Dominance   Dominant Hand: Right    Extremity/Trunk Assessment   Upper Extremity  Assessment: Defer to OT evaluation           Lower Extremity Assessment: Generalized weakness (symetrical weakness L/R, h/o peripherial neuropathy)      Cervical / Trunk Assessment: Other exceptions  Communication   Communication: No difficulties  Cognition Arousal/Alertness: Lethargic (sleepy per pt report) Behavior During Therapy: Impulsive Overall Cognitive Status: Impaired/Different from baseline Area of Impairment: Safety/judgement         Safety/Judgement: Decreased awareness of safety                   Assessment/Plan    PT Assessment Patient needs continued PT services  PT Diagnosis Difficulty walking;Abnormality of gait;Generalized weakness;Altered mental status   PT Problem List Decreased strength;Decreased activity tolerance;Decreased balance;Decreased mobility;Decreased knowledge of use of DME;Decreased safety awareness;Pain;Obesity  PT Treatment Interventions DME instruction;Gait training;Stair training;Functional mobility training;Therapeutic activities;Therapeutic exercise;Balance training;Neuromuscular re-education;Cognitive remediation;Patient/family education   PT Goals (Current goals can be found in the Care Plan section) Acute Rehab PT Goals Patient Stated Goal: wife wants to have [place] for "back up" when he is having a bad day with his back PT Goal Formulation: With patient/family Time For Goal Achievement: 03/31/16 Potential to Achieve Goals: Good    Frequency Min 4X/week           End of Session Equipment Utilized During Treatment: Gait belt Activity Tolerance: Patient limited by pain Patient left: in chair;with call bell/phone within reach;with chair alarm set;with family/visitor present Nurse Communication: Mobility status         Time: 1201-1220 PT Time Calculation (min) (ACUTE ONLY): 19 min   Charges:   PT Evaluation $PT Eval Moderate Complexity: 1 Procedure          Lliam Hoh B. [place], [place], DPT [phone]   03/17/2016,  3:27 PM

## 2016-03-17 NOTE — Progress Notes (Signed)
PHARMACIST - PHYSICIAN COMMUNICATION DR:  Erlinda Hong, J CONCERNING:  METFORMIN SAFE ADMINISTRATION POLICY  RECOMMENDATION: Metformin has been placed on DISCONTINUE (rejected order) STATUS and should be reordered only after any of the conditions below are ruled out.  Current Safety recommendations include avoiding metformin for a minimum of 48 hours after the patient's exposure to intravenous contrast media for the following conditions:  . eGFR < 60 ml/min  . Liver disease, alcoholism, heart failure, intra-arterial administration of contrast  DESCRIPTION:  The Pharmacy Committee has adopted a policy that restricts the use of metformin in hospitalized patients until all the contraindications to administration have been ruled out. Specific contraindications are: _0  Serum creatinine ? 1.5 for males _1  Serum creatinine ? 1.4 for females _2  Shock, acute MI, sepsis, hypoxemia, dehydration _3  Planned administration of intravenous iodinated contrast media when eGFR < 34m/min, Liver Disease, alcoholism, heart failure or intra-arterial administration of contrast _4  Heart Failure patients with low EF _5  Acute or chronic metabolic acidosis (including DKA)   KMyer Peer([place]land Ormond, PharmD  PGY1 Pharmacy Resident Pager: [phone]/19/2017 10:43 AM

## 2016-03-17 NOTE — Progress Notes (Signed)
[place] Progress Note Patient Name: Jose [name] DOB: May 18, 1951 MRN: [id number]   Date of Service  03/17/2016  HPI/Events of Note  CVA, s/p TPA Stable on camera check  eICU Interventions  No eICU intervention     Intervention Category Evaluation Type: New Patient Evaluation  Simonne Maffucci 03/17/2016, 1:10 AM

## 2016-03-17 NOTE — Progress Notes (Signed)
Called to placed pt on cpap.  Per pt: he wears 8 cpap at home.  Placed pt on auto cpap (8 low, 20 high).  Pt states he feel that these settings are comfortable, pt states he feels he's getting enough "breath".  No distress noted, Sat 93-94% on cpap w/ 2 lpm O2 bled in.

## 2016-03-18 ENCOUNTER — Inpatient Hospital Stay (HOSPITAL_COMMUNITY): Payer: Medicare Other

## 2016-03-18 DIAGNOSIS — I10 Essential (primary) hypertension: Secondary | ICD-10-CM

## 2016-03-18 DIAGNOSIS — R739 Hyperglycemia, unspecified: Secondary | ICD-10-CM | POA: Diagnosis present

## 2016-03-18 DIAGNOSIS — G451 Carotid artery syndrome (hemispheric): Secondary | ICD-10-CM | POA: Diagnosis present

## 2016-03-18 DIAGNOSIS — I639 Cerebral infarction, unspecified: Secondary | ICD-10-CM

## 2016-03-18 DIAGNOSIS — I509 Heart failure, unspecified: Secondary | ICD-10-CM | POA: Insufficient documentation

## 2016-03-18 DIAGNOSIS — E785 Hyperlipidemia, unspecified: Secondary | ICD-10-CM

## 2016-03-18 DIAGNOSIS — E1169 Type 2 diabetes mellitus with other specified complication: Secondary | ICD-10-CM | POA: Diagnosis present

## 2016-03-18 DIAGNOSIS — N179 Acute kidney failure, unspecified: Secondary | ICD-10-CM | POA: Diagnosis present

## 2016-03-18 DIAGNOSIS — E119 Type 2 diabetes mellitus without complications: Secondary | ICD-10-CM | POA: Diagnosis present

## 2016-03-18 DIAGNOSIS — I6522 Occlusion and stenosis of left carotid artery: Secondary | ICD-10-CM

## 2016-03-18 DIAGNOSIS — I6529 Occlusion and stenosis of unspecified carotid artery: Secondary | ICD-10-CM

## 2016-03-18 DIAGNOSIS — I5033 Acute on chronic diastolic (congestive) heart failure: Secondary | ICD-10-CM

## 2016-03-18 LAB — GLUCOSE, CAPILLARY
GLUCOSE-CAPILLARY: 293 mg/dL — AB (ref 65–99)
GLUCOSE-CAPILLARY: 193 mg/dL — AB (ref 65–99)
Glucose-Capillary: 152 mg/dL — ABNORMAL HIGH (ref 65–99)
Glucose-Capillary: 160 mg/dL — ABNORMAL HIGH (ref 65–99)
Glucose-Capillary: 202 mg/dL — ABNORMAL HIGH (ref 65–99)

## 2016-03-18 LAB — BASIC METABOLIC PANEL
ANION GAP: 9 (ref 5–15)
BUN: 36 mg/dL — ABNORMAL HIGH (ref 6–20)
CHLORIDE: 98 mmol/L — AB (ref 101–111)
CO2: 26 mmol/L (ref 22–32)
Calcium: 9.1 mg/dL (ref 8.9–10.3)
Creatinine, Ser: 1.3 mg/dL — ABNORMAL HIGH (ref 0.61–1.24)
GFR calc non Af Amer: 56 mL/min — ABNORMAL LOW (ref >60)
GLUCOSE: 167 mg/dL — AB (ref 65–99)
POTASSIUM: 4.8 mmol/L (ref 3.5–5.1)
Sodium: 133 mmol/L — ABNORMAL LOW (ref 135–145)

## 2016-03-18 LAB — CBC
HEMATOCRIT: 41.2 % (ref 39.0–52.0)
HEMOGLOBIN: 13.5 g/dL (ref 13.0–17.0)
MCH: 29.2 pg (ref 26.0–34.0)
MCHC: 32.8 g/dL (ref 30.0–36.0)
MCV: 89 fL (ref 78.0–100.0)
Platelets: 179 10*3/uL (ref 150–400)
RBC: 4.63 MIL/uL (ref 4.22–5.81)
RDW: 14 % (ref 11.5–15.5)
WBC: 8.5 10*3/uL (ref 4.0–10.5)

## 2016-03-18 LAB — HEMOGLOBIN A1C
Hgb A1c MFr Bld: 10.2 % — ABNORMAL HIGH (ref 4.8–5.6)
MEAN PLASMA GLUCOSE: 246 mg/dL

## 2016-03-18 MED ORDER — IOPAMIDOL (ISOVUE-370) INJECTION 76%
INTRAVENOUS | Status: AC
  Administered 2016-03-18: 50 mL
  Filled 2016-03-18: qty 50

## 2016-03-18 MED ORDER — BISACODYL 5 MG PO TBEC
5.0000 mg | DELAYED_RELEASE_TABLET | Freq: Every day | ORAL | Status: DC | PRN
  Administered 2016-03-19 – 2016-03-20 (×2): 5 mg via ORAL
  Filled 2016-03-18 (×2): qty 1

## 2016-03-18 NOTE — Care Management Important Message (Signed)
Important Message  Patient Details  Name: Jose [name] MRN: [id number] Date of Birth: [birth year]   Medicare Important Message Given:  Yes    [name], [place] 03/18/2016, 1:34 PM

## 2016-03-18 NOTE — Consult Note (Addendum)
CARDIOLOGY CONSULT NOTE   Patient ID: Jose [name] MRN: [id number] DOB/AGE: 05-25-1951 65 y.o.  Admit date: 03/16/2016  Primary Physician   No PCP Per Patient Primary Cardiologist   Dr. Harl Bowie Reason for Consultation   Antihypertensive management with blood pressure goal of 130 to 150 due to stroke. Requesting Physician  Dr. Judith Part  HPI: Jose [name] is a 65 y.o. male with a history of morbid obesity, COPD, diabetes, CAD, OSA, hypertension, left carotid stenosis hyperlipidemia, chronic diastolic heart failure presented with acute CVA.   Patient has a history of CAD with prior silent inferior MI, NSTEMI 07/2014 with chronically occluded RCA s/p unsuccessful PCI with otherwise nonobstructive residual disease. Cath by Dr. Terrence Dupont 07/2014 revealed EF 50-55% with inferobasal wall severe hypokinesia, 40-50% ostial stenosis and 20-30% proximal stenosis, mild dz in D1/D2/Cx/OM2, 100% RCA filling by bridging collaterals.  The patient states that he has a history of 100% blockage on the left carotid artery and followed at [place], no intervention.   The patient was admitted 02/24/16 02/27/16 for acute and chronic diastolic dysfunction with acute respiratory failure. Echo shows left ventricular function of 0000000 grade 1 diastolic dysfunction, no wall motion abnormality. The patient him in volume overloaded when seen by Dr. Harl Bowie during an outpatient visit 03/03/16, No change in medication.   The patient had a episode of near syncope 03/05/16 with dizziness. That time his SBP was 85. The patient states that he has a history of sudden drop in his blood pressure on amlodipine.  The patient again had an episode of near syncope 03/16/16. He was sitting with his son on the couch and suddenly fell. He stand up and went to bathroom where he was not able to stand up and slurred speech @ 1800. ED route to the [facility] ED he apparently had the abrupt onset of aphasia and right-sided weakness. In ED, noted receptive aphasia as well  as a right-sided drift. Given tPA and transferred to [facility]. MRI showed no acute stoke. MRA no large vessel stenosis. Neurology concern  of blood pressure dependent L brain stroke/TIA with suspicious hypotension in setting of known carotid stenosis. Pending carotid US. Scr of 1.42-->1.3. BP of 165/70. Currently on Lasix 80 mg twice a day, Lopressor resumed at half a dose 12.5 MG twice a day. Hold home Lisinopril and amlodipine.   Past Medical History  Diagnosis Date  . Hypertension   . Sleep apnea   . COPD (chronic obstructive pulmonary disease) ([place])   . Diabetes mellitus ([place])   . Neuropathy ([place])   . CAD (coronary artery disease)     a. prior silent inferior MI, NSTEMI 07/2014 with chronically occluded RCA s/p unsuccessful PCI with otherwise nonobstructive residual disease  . Morbid obesity ([place])   . Former tobacco use   . Hyperlipidemia   . Chronic respiratory failure ([place])     a. Placed on home O2 01/2016.     Past Surgical History  Procedure Laterality Date  . Back surgery    . Left heart catheterization with coronary angiogram N/A 08/28/2014    Procedure: LEFT HEART CATHETERIZATION WITH CORONARY ANGIOGRAM;  Surgeon: Clent Demark, MD;  Location: [facility] CATH LAB;  Service: Cardiovascular;  Laterality: N/A;    No Known Allergies  I have reviewed the patient's current medications . aspirin  81 mg Oral Daily  . atorvastatin  80 mg Oral q1800  . clopidogrel  75 mg Oral Daily  . ferrous sulfate  325 mg Oral TID WC  .  furosemide  80 mg Oral BID  . gemfibrozil  600 mg Oral BID AC  . glipiZIDE  10 mg Oral Q breakfast  . glipiZIDE  5 mg Oral BID AC  . insulin aspart  0-15 Units Subcutaneous TID AC & HS  . insulin aspart  30 Units Subcutaneous TID AC  . insulin glargine  30 Units Subcutaneous Daily   And  . insulin glargine  60 Units Subcutaneous QHS  . magnesium oxide  400 mg Oral Daily  . metoprolol tartrate  12.5 mg Oral BID  . mupirocin cream   Topical Daily  .  oxyCODONE-acetaminophen  2 tablet Oral Q6H  . pantoprazole  40 mg Oral Daily  . sertraline  100 mg Oral Daily  . tiotropium  18 mcg Inhalation QHS   . sodium chloride 50 mL/hr at 03/18/16 0514  . clevidipine Stopped (03/17/16 1100)   acetaminophen **OR** [DISCONTINUED] acetaminophen, ondansetron (ZOFRAN) IV, senna-docusate  Prior to Admission medications   Medication Sig Start Date End Date Taking? Authorizing Provider  amLODipine (NORVASC) 2.5 MG tablet Take 1 tablet (2.5 mg total) by mouth daily. 02/27/16  Yes Kathie Dike, MD  aspirin 81 MG tablet Take 81 mg by mouth daily.   Yes Historical Provider, MD  atorvastatin (LIPITOR) 80 MG tablet Take 1 tablet (80 mg total) by mouth daily at 6 PM. 08/29/14  Yes Charolette Forward, MD  ergocalciferol (VITAMIN D2) 50000 UNITS capsule Take 50,000 Units by mouth See admin instructions. Take one twice a week on wednesdays and saturdays   Yes Historical Provider, MD  ferrous sulfate 325 (65 FE) MG tablet Take 325 mg by mouth 3 (three) times daily with meals.   Yes Historical Provider, MD  furosemide (LASIX) 80 MG tablet Take 1 tablet (80 mg total) by mouth 2 (two) times daily. 02/27/16  Yes Kathie Dike, MD  glipiZIDE (GLUCOTROL) 5 MG tablet Take 5-10 mg by mouth 3 (three) times daily. 10mg  in the morning, 5mg  in the afternoon, 5mg  in the evening   Yes Historical Provider, MD  insulin aspart (NOVOLOG) 100 UNIT/ML injection Inject 30 Units into the skin 3 (three) times daily before meals.    Yes Historical Provider, MD  insulin glargine (LANTUS) 100 UNIT/ML injection Inject 30-60 Units into the skin See admin instructions. 30 units in the AM and 60 units in the PM   Yes Historical Provider, MD  lisinopril (PRINIVIL,ZESTRIL) 40 MG tablet Take 40 mg by mouth daily.   Yes Historical Provider, MD  magnesium oxide (MAG-OX) 400 MG tablet Take 1 tablet (400 mg total) by mouth daily. 03/04/16  Yes Arnoldo Lenis, MD  metFORMIN (GLUCOPHAGE) 1000 MG tablet Take 1  tablet (1,000 mg total) by mouth 2 (two) times daily with a meal. 08/30/14  Yes Charolette Forward, MD  metoprolol tartrate (LOPRESSOR) 25 MG tablet Take 1 tablet (25 mg total) by mouth 2 (two) times daily. 03/03/16  Yes Arnoldo Lenis, MD  Multiple Vitamins-Minerals (ICAPS AREDS 2 PO) Take 1 capsule by mouth 2 (two) times daily.    Yes Historical Provider, MD  nitroGLYCERIN (NITROSTAT) 0.4 MG SL tablet Place 1 tablet (0.4 mg total) under the tongue every 5 (five) minutes x 3 doses as needed for chest pain. 08/29/14  Yes Charolette Forward, MD  Omega-3 Fatty Acids (FISH OIL) 1000 MG CAPS Take 2-3 capsules by mouth 3 (three) times daily. Take 7 tables a day to lower triglycerides per list 2 tablets in the morning, 3 tablets at noon, 2  tablets at bedtime   Yes Historical Provider, MD  oxyCODONE-acetaminophen (PERCOCET/ROXICET) 5-325 MG per tablet Take 2 tablets by mouth every 4 (four) hours. Take eight tablets daily as needed for pain per family and list. For bad  Back pain   Yes Historical Provider, MD  potassium chloride SA (K-DUR,KLOR-CON) 20 MEQ tablet Take 1 tablet (20 mEq total) by mouth 2 (two) times daily. 02/27/16  Yes Kathie Dike, MD  sertraline (ZOLOFT) 100 MG tablet Take 100 mg by mouth daily.   Yes Historical Provider, MD  terazosin (HYTRIN) 2 MG capsule Take 4 mg by mouth every evening.   Yes Historical Provider, MD  tiotropium (SPIRIVA) 18 MCG inhalation capsule Place 18 mcg into inhaler and inhale at bedtime.    Yes Historical Provider, MD  vitamin B-12 (CYANOCOBALAMIN) 1000 MCG tablet Take 1,000 mcg by mouth daily.   Yes Historical Provider, MD     Social History   Social History  . Marital Status: Unknown    Spouse Name: N/A  . Number of Children: N/A  . Years of Education: N/A   Occupational History  . Not on file.   Social History Main Topics  . Smoking status: Former Smoker -- 3.00 packs/day for 45 years    Types: Cigarettes  . Smokeless tobacco: Not on file  . Alcohol Use: No    . Drug Use: Not on file  . Sexual Activity: Not on file   Other Topics Concern  . Not on file   Social History Narrative    No family status information on file.   History reviewed. No pertinent family history of CAD.      ROS:  Full 14 point review of systems complete and found to be negative unless listed above.  Physical Exam: Blood pressure 165/70, pulse 72, temperature 98.5 F (36.9 C), temperature source Oral, resp. rate 16, height 6' (1.829 m), weight 292 lb 8.8 oz (132.7 kg), SpO2 98 %.  General: Well developed, well nourished, male in no acute distress Head: Eyes PERRLA, No xanthomas. Normocephalic and atraumatic, oropharynx without edema or exudate.  Lungs: Resp regular and unlabored, CTA. Heart: RRR no s3, s4, or murmurs..   Neck: left carotid bruits. No lymphadenopathy. No  JVD. Abdomen: Bowel sounds present, abdomen soft and non-tender without masses or hernias noted. Msk:  No spine or cva tenderness. No weakness, no joint deformities or effusions. Extremities: No clubbing, cyanosis. Trace BL LE edema. DP/PT/Radials 2+ and equal bilaterally. Neuro: Alert and oriented X 3. No focal deficits noted. Psych:  Good affect, responds appropriately Skin: No rashes or lesions noted.  Labs:   Lab Results  Component Value Date   WBC 8.5 03/18/2016   HGB 13.5 03/18/2016   HCT 41.2 03/18/2016   MCV 89.0 03/18/2016   PLT 179 03/18/2016    Recent Labs  03/16/16 2230  INR 1.01    Recent Labs Lab 03/16/16 2230  03/18/16 0213  NA 130*  < > 133*  K 6.1*  < > 4.8  CL 92*  < > 98*  CO2 29  < > 26  BUN 51*  < > 36*  CREATININE 2.14*  < > 1.30*  CALCIUM 9.7  < > 9.1  PROT 8.1  --   --   BILITOT 0.5  --   --   ALKPHOS 56  --   --   ALT 30  --   --   AST 28  --   --   GLUCOSE 113*  < >  167*  ALBUMIN 4.1  --   --   < > = values in this interval not displayed. MAGNESIUM  Date Value Ref Range Status  03/03/2016 1.6* 1.7 - 2.4 mg/dL Final   No results for  input(s): CKTOTAL, CKMB, TROPONINI in the last 72 hours.  Recent Labs  03/16/16 2309  TROPIPOC 0.03   No results found for: PROBNP Lab Results  Component Value Date   CHOL 168 03/17/2016   HDL 26* 03/17/2016   LDLCALC UNABLE TO CALCULATE IF TRIGLYCERIDE OVER 400 mg/dL 03/17/2016   TRIG 561* 03/17/2016   No results found for: DDIMER No results found for: LIPASE, AMYLASE TSH  Date/Time Value Ref Range Status  08/27/2014 10:02 PM 0.707 0.350 - 4.500 uIU/mL Final   No results found for: VITAMINB12, FOLATE, FERRITIN, TIBC, IRON, RETICCTPCT  Echo: 02/26/16 Indications: CHF - 428.0.  ------------------------------------------------------------------- History: PMH: elevated Troponins. Chronic obstructive pulmonary disease. PMH: OSA, Morbid Obesity. Risk factors: Hypertension. Diabetes mellitus.  ------------------------------------------------------------------- Study Conclusions  - Left ventricle: Poor acoustic windows limit study Definity used  to optimize Overall LVEF appears normal at 55 to 60% The cavity  size was normal. Wall thickness was increased in a pattern of  moderate LVH. Doppler parameters are consistent with abnormal  left ventricular relaxation (grade 1 diastolic dysfunction). - Left atrium: The atrium was moderately dilated.   ECG:  Sinus rhythm Vent. rate 80 BPM PR interval * ms QRS duration 122 ms QT/QTc 368/425 ms P-R-T axes 84 95 -4  Radiology:  Mr Brain Wo Contrast  03/17/2016  CLINICAL DATA:  Acute onset aphasia and right-sided weakness. EXAM: MRI HEAD WITHOUT CONTRAST MRA HEAD WITHOUT CONTRAST TECHNIQUE: Multiplanar, multiecho pulse sequences of the brain and surrounding structures were obtained without intravenous contrast. Angiographic images of the head were obtained using MRA technique without contrast. COMPARISON:  CT head without contrast 03/16/2016 FINDINGS: MRI HEAD FINDINGS The diffusion-weighted images demonstrate no  evidence for acute or subacute infarction. No acute hemorrhage or mass lesion is present. Mild generalized atrophy is present. Moderate periventricular and subcortical white matter changes bilaterally are asymmetric on the left. Ventricles are proportionate to the degree of atrophy. A remote lacunar infarct is present in the right thalamus. White matter changes extend into the brainstem. The cerebellum is unremarkable. The internal auditory canals are normal. Flow is present in the major intracranial arteries. The globes and orbits are intact. MRA HEAD FINDINGS There is moderate tortuosity of the cervical left internal carotid artery. Overall signal is diminished in the left internal carotid artery compared to the right. Atherosclerotic irregularity is present within the anterior cavernous internal carotid artery bilaterally. The right internal carotid artery is otherwise normal. A fetal type right posterior cerebral artery is present. There is mild narrowing of the A1 segments bilaterally. An azygos A2 segment is present. The MCA bifurcations are intact bilaterally. There is decreased flow signal in the left MCA branches compared to the right. Distal small vessel disease is present bilaterally. The left vertebral artery is slightly dominant to the right. PICA origins are visualized and normal. The basilar artery is within normal limits. The left posterior cerebral artery originates from the basilar tip. The right posterior cerebral artery is of fetal type with a small right P1 segment. Mild attenuation of distal PCA branch vessels is noted bilaterally. IMPRESSION: 1. No acute infarct. 2. Periventricular and subcortical white matter disease bilaterally is asymmetric on the left. 3. Asymmetric decreased flow signal within a tortuous left internal carotid artery and  asymmetric flow signal in the left MCA branch vessels. This suggests a more proximal stenosis. Carotid bifurcation analysis is advised. 4. Mild distal  small vessel disease without a significant proximal stenosis, aneurysm, or branch vessel occlusion within either the anterior or posterior circulation. Electronically Signed   By: San Morelle M.D.   On: 03/17/2016 21:21   Dg Chest Port 1 View  03/17/2016  CLINICAL DATA:  Shortness of breath for 1 day EXAM: PORTABLE CHEST 1 VIEW COMPARISON:  02/24/2016 FINDINGS: Bilateral diffuse mild interstitial thickening. There is no focal parenchymal opacity. There is no pleural effusion or pneumothorax. There is stable cardiomegaly. The osseous structures are unremarkable. IMPRESSION: Cardiomegaly with mild pulmonary vascular congestion. Electronically Signed   By: Kathreen Devoid   On: 03/17/2016 15:38   Mr Jodene Nam Head/brain Wo Cm  03/17/2016  CLINICAL DATA:  Acute onset aphasia and right-sided weakness. EXAM: MRI HEAD WITHOUT CONTRAST MRA HEAD WITHOUT CONTRAST TECHNIQUE: Multiplanar, multiecho pulse sequences of the brain and surrounding structures were obtained without intravenous contrast. Angiographic images of the head were obtained using MRA technique without contrast. COMPARISON:  CT head without contrast 03/16/2016 FINDINGS: MRI HEAD FINDINGS The diffusion-weighted images demonstrate no evidence for acute or subacute infarction. No acute hemorrhage or mass lesion is present. Mild generalized atrophy is present. Moderate periventricular and subcortical white matter changes bilaterally are asymmetric on the left. Ventricles are proportionate to the degree of atrophy. A remote lacunar infarct is present in the right thalamus. White matter changes extend into the brainstem. The cerebellum is unremarkable. The internal auditory canals are normal. Flow is present in the major intracranial arteries. The globes and orbits are intact. MRA HEAD FINDINGS There is moderate tortuosity of the cervical left internal carotid artery. Overall signal is diminished in the left internal carotid artery compared to the right.  Atherosclerotic irregularity is present within the anterior cavernous internal carotid artery bilaterally. The right internal carotid artery is otherwise normal. A fetal type right posterior cerebral artery is present. There is mild narrowing of the A1 segments bilaterally. An azygos A2 segment is present. The MCA bifurcations are intact bilaterally. There is decreased flow signal in the left MCA branches compared to the right. Distal small vessel disease is present bilaterally. The left vertebral artery is slightly dominant to the right. PICA origins are visualized and normal. The basilar artery is within normal limits. The left posterior cerebral artery originates from the basilar tip. The right posterior cerebral artery is of fetal type with a small right P1 segment. Mild attenuation of distal PCA branch vessels is noted bilaterally. IMPRESSION: 1. No acute infarct. 2. Periventricular and subcortical white matter disease bilaterally is asymmetric on the left. 3. Asymmetric decreased flow signal within a tortuous left internal carotid artery and asymmetric flow signal in the left MCA branch vessels. This suggests a more proximal stenosis. Carotid bifurcation analysis is advised. 4. Mild distal small vessel disease without a significant proximal stenosis, aneurysm, or branch vessel occlusion within either the anterior or posterior circulation. Electronically Signed   By: San Morelle M.D.   On: 03/17/2016 21:21   Ct Head Code Stroke Wo Contrast`  03/16/2016  CLINICAL DATA:  Sudden onset right-sided weakness EXAM: CT HEAD WITHOUT CONTRAST TECHNIQUE: Contiguous axial images were obtained from the base of the skull through the vertex without intravenous contrast. COMPARISON:  None available (02/17/2014) FINDINGS: Brain: No evidence of acute infarction, hemorrhage, hydrocephalus, or mass lesion/mass effect. Aspects is 10. Vascular: No hyperdense vessel . Atherosclerotic  calcification in the vertebral  arteries and carotid siphons. Skull: Negative for fracture or focal lesion. Sinuses/Orbits: No acute finding. Other: These results were called by telephone at the time of interpretation on 03/16/2016 at 10:40 pm to Dr. Noemi Chapel , who verbally acknowledged these results. IMPRESSION: No acute finding or change compared to 2015.  Aspects is 10. Electronically Signed   By: Monte Fantasia M.D.   On: 03/16/2016 22:40    ASSESSMENT AND PLAN:     1. Acute stroke - Neurology concern  of blood pressure dependent L brain stroke/TIA with suspicious hypotension in setting of known carotid stenosis. BP goal of 130-150. Pending carotid US.  2. Chronic diastolic dysfunction - Total weight loss of 4lb (302-->292lb). However net I & O positive 68. His symptoms has improved significantly since discharge. Shortness of breath resolved and now able to lay flat. Still has mild lower extremity edema. Scr continue to improved 2.14-->1.3. Continue Lasix by mouth 80 mg twice a day.  - 01/2016 echo LVEF 0000000, grade I diastolic dysfunction  3. CAD - Previous cath 07/2014 with LM patent, LAD ostial 40-50%, LCX mld disease, RCA occluded filling by collaterals. Unsuccesful attempt at RCA PCI.  - Discontinued Plavix 03/03/16.  No anginal pain.   4. Carotid artery disease  - Patient has a known 100% occluded left ICA. Pending carotid artery stenosis.   5. Hypertension - BP goal of 130-150 due to stroke. History of near-syncope/dizziness on amlodipine.  Continue current medication Lasix 80 mg twice a day, Lopressor 12.5 MG twice a day. Continue to hold home Lisinopril and amlodipine and f/u with Dr. Harl Bowie in 1-2 weeks.   SignedLeanor Kail, [place] 03/18/2016, 1:11 PM Pager [phone]  Co-Sign MD  Patient examined chart reviewed.  Would tolerate slightly above normal BP given transient neuro findings. Wife indicated that patient has a total occlusion of one of his carotid arteries. CTA her shows string sign on left  With antigrade flow should have VVS consult for possible surgical correcton  Continue diuretic for LE edema and beta blocker Hold ACE/amlodipine for now  [employer]

## 2016-03-18 NOTE — Progress Notes (Signed)
Physical Therapy Treatment Patient Details Name: Jose [name] MRN: [id number] DOB: [birth year] Today's Date: 03/18/2016    History of Present Illness 65 y.o. male admitted to [facility] on 03/16/16 for weakness and difficulty walking.   In the ambulance en route to the hospital he also started having difficulty speaking with R sided weakness. Dx with presumed L brain stroke s/p tPA. Stroke workup pending.  Pt with significant PMHx of HTN, COPD, DM, neuropathy, CAD, morbid obesity, low back surgery.      PT Comments    Patient is progressing toward mobility goals however continues to demo impulsivity and decreased awareness of balance deficits. Continue to progress as tolerated with anticipated d/c home with HHPT.   Follow Up Recommendations  Home health PT;Supervision/Assistance - 24 hour     Equipment Recommendations  Wheelchair (measurements PT);Wheelchair cushion (measurements PT) (20x20 WC)    Recommendations for Other Services       Precautions / Restrictions Precautions Precautions: Fall Precaution Comments: pt, at baseline uses RW.  Remote h/o falls related to back pain.  Restrictions Weight Bearing Restrictions: No    Mobility  Bed Mobility Overal bed mobility: Modified Independent Bed Mobility: Sit to Supine     Supine to sit: Supervision Sit to supine: Modified independent (Device/Increase time)   General bed mobility comments: HOB flat without use of bed rails.  Transfers Overall transfer level: Needs assistance Equipment used: None Transfers: Sit to/from Stand Sit to Stand: Supervision;Min guard         General transfer comment: supervision for stand and min guard for safe descent to EOB; cues for safe hand placement  Ambulation/Gait Ambulation/Gait assistance: Min guard Ambulation Distance (Feet): 100 Feet (81ft with AD; 9ft without AD) Assistive device: Rolling walker (2 wheeled) Gait Pattern/deviations: Step-through pattern;Trunk flexed;Wide base of  support     General Gait Details: cues for posture and cadence; unsteady with vertical head turns; several short standing rest periods due to c/o back pain   Stairs            Wheelchair Mobility    Modified Rankin (Stroke Patients Only) Modified Rankin (Stroke Patients Only) Pre-Morbid Rankin Score: Moderately severe disability Modified Rankin: Moderately severe disability     Balance Overall balance assessment: Needs assistance Sitting-balance support: Feet supported;No upper extremity supported Sitting balance-Leahy Scale: Good     Standing balance support: No upper extremity supported;During functional activity Standing balance-Leahy Scale: Poor   Single Leg Stance - Right Leg:  (LOB with attempt X2 ) Single Leg Stance - Left Leg:  (LOB with attempt X2)                Cognition Arousal/Alertness: Awake/alert Behavior During Therapy: Impulsive Overall Cognitive Status: Impaired/Different from baseline Area of Impairment: Safety/judgement         Safety/Judgement: Decreased awareness of safety;Decreased awareness of deficits          Exercises      General Comments General comments (skin integrity, edema, etc.): pt reported that he can't believe his balance is "so bad" after attempt of single leg stance       Pertinent Vitals/Pain Pain Assessment: Faces Faces Pain Scale: Hurts little more Pain Location: low back  Pain Descriptors / Indicators: Aching Pain Intervention(s): Monitored during session;Repositioned    Home Living Family/patient expects to be discharged to:: Private residence Living Arrangements: Spouse/significant other Available Help at Discharge: Family;Available 24 hours/day Type of Home: House Home Access: Stairs to enter Entrance Stairs-Rails: None Home Layout: One  level Home Equipment: Walker - 2 wheels;Shower seat;Cane - single point      Prior Function Level of Independence: Needs assistance  Gait / Transfers  Assistance Needed: wife reports over the past few weeks she has felt that she needs to supervise him when he is up on his feet.  ADL's / Homemaking Assistance Needed: per wife assist needed at times for ADLs     PT Goals (current goals can now be found in the care plan section) Acute Rehab PT Goals Patient Stated Goal: wife wants to have [place] for "back up" when he is having a bad day with his back PT Goal Formulation: With patient/family Time For Goal Achievement: 03/31/16 Potential to Achieve Goals: Good Progress towards PT goals: Progressing toward goals    Frequency  Min 4X/week    PT Plan Current plan remains appropriate    Co-evaluation             End of Session Equipment Utilized During Treatment: Gait belt Activity Tolerance: Patient tolerated treatment well Patient left: with call bell/phone within reach;in bed;with nursing/sitter in room     Time: [id number] PT Time Calculation (min) (ACUTE ONLY): 26 min  Charges:  $Gait Training: 8-22 mins $Therapeutic Activity: 8-22 mins                    G Codes:      [place] April, PTA Pager: [phone]   03/18/2016, 5:32 PM

## 2016-03-18 NOTE — Progress Notes (Signed)
Pt. States he can place cpap on himself when he is ready.

## 2016-03-18 NOTE — Progress Notes (Signed)
STROKE TEAM PROGRESS NOTE   SUBJECTIVE (INTERVAL HISTORY) Wife at the bedside. Questions about possible vascular intervention in setting of stroke symptoms with likely severe left ICA stenosis on MRA head. However, CUS showed left ICA occlusion. Will need CTA neck to further evaluate. Pt condition much improved from yesterday, asterixis resolved, BP and glucose improved too.    OBJECTIVE Temp:  [97.5 F (36.4 C)-98.7 F (37.1 C)] 97.5 F (36.4 C) (07/20 0652) Pulse Rate:  [70-108] 77 (07/20 0652) Cardiac Rhythm:  [-] Normal sinus rhythm (07/19 2337) Resp:  [13-26] 16 (07/20 0652) BP: (108-169)/(47-96) 167/70 mmHg (07/20 0652) SpO2:  [91 %-99 %] 96 % (07/20 0652)  CBC:   Recent Labs Lab 03/16/16 2230 03/16/16 2300 03/18/16 0213  WBC 11.6*  --  8.5  NEUTROABS 9.1*  --   --   HGB 15.6 17.0 13.5  HCT 45.6 50.0 41.2  MCV 88.4  --  89.0  PLT 195  --  0000000    Basic Metabolic Panel:   Recent Labs Lab 03/17/16 1949 03/18/16 0213  NA 134* 133*  K 4.6 4.8  CL 98* 98*  CO2 26 26  GLUCOSE 157* 167*  BUN 38* 36*  CREATININE 1.42* 1.30*  CALCIUM 9.2 9.1    Lipid Panel:     Component Value Date/Time   CHOL 168 03/17/2016 0453   TRIG 561* 03/17/2016 0453   HDL 26* 03/17/2016 0453   CHOLHDL 6.5 03/17/2016 0453   VLDL UNABLE TO CALCULATE IF TRIGLYCERIDE OVER 400 mg/dL 03/17/2016 0453   LDLCALC UNABLE TO CALCULATE IF TRIGLYCERIDE OVER 400 mg/dL 03/17/2016 0453   HgbA1c:  Lab Results  Component Value Date   HGBA1C 10.2* 03/17/2016   Urine Drug Screen:     Component Value Date/Time   LABOPIA NONE DETECTED 03/17/2016 0630   COCAINSCRNUR NONE DETECTED 03/17/2016 0630   LABBENZ NONE DETECTED 03/17/2016 0630   AMPHETMU NONE DETECTED 03/17/2016 0630   THCU NONE DETECTED 03/17/2016 0630   LABBARB NONE DETECTED 03/17/2016 0630      IMAGING  Ct Head Code Stroke Wo Contrast 03/16/2016  IMPRESSION: No acute finding or change compared to 2015.  Aspects is 10.   MRI and  MRA  03/17/2016    1. No acute infarct. 2. Periventricular and subcortical white matter disease bilaterally is asymmetric on the left. 3. Asymmetric decreased flow signal within a tortuous left internal carotid artery and asymmetric flow signal in the left MCA branch vessels. This suggests a more proximal stenosis. Carotid bifurcation analysis is advised. 4. Mild distal small vessel disease without a significant proximal stenosis, aneurysm, or branch vessel occlusion within either the anterior or posterior circulation.   Dg Chest Port 1 View 03/17/2016    Cardiomegaly with mild pulmonary vascular congestion.   CUS  pending - concerning for left ICA high grade stenosis  CTA head and neck - pending  2D echo  - Left ventricle: Poor acoustic windows limit study Definity used to optimize Overall LVEF appears normal at 55 to 60% The cavity size was normal. Wall thickness was increased in a pattern of moderate LVH. Doppler parameters are consistent with abnormal left ventricular relaxation (grade 1 diastolic dysfunction). - Left atrium: The atrium was moderately dilated.   PHYSICAL EXAM General - Morbid obesity, well developed, no in acute distress.  Ophthalmologic - Fundi not visualized due to acute distress.  Cardiovascular - Regular rate and rhythm.  Mental Status -  Level of arousal and orientation to time, place, and person  were intact. Language including expression, naming, repetition, comprehension was assessed and found intact Fund of Knowledge was assessed and was intact.  Cranial Nerves II - XII - II - Visual field intact OU. III, IV, VI - Extraocular movements intact. V - Facial sensation intact bilaterally. VII - Facial movement intact bilaterally. VIII - Hearing & vestibular intact bilaterally. X - Palate elevates symmetrically. XI - Chin turning & shoulder shrug intact bilaterally. XII - Tongue protrusion intact.  Motor Strength - The patient's strength was 4+/5 in all  extremities and pronator drift was absent.  Bulk was normal and fasciculations were absent.   Motor Tone - Muscle tone was assessed at the neck and appendages and was normal.  Reflexes - The patient's reflexes were 1+ in all extremities and he had no pathological reflexes.  Sensory - Light touch, temperature/pinprick were assessed and were symmetrical.    Coordination - The patient had normal movements in the hands with no ataxia or dysmetria.  Tremor was absent.  Gait and Station - not tested due to acute distress.   ASSESSMENT/PLAN Jose [name] is a 65 y.o. male with history of HTN, DM, CAD, HLD, obestiy, OSA, carotid stenosis, CHF and COPD who presented with generalized weakness and near syncope but developed aphasia and right-sided weakness in ambulance on way to [facility]. At [facility], he received IV t-PA 03/16/2016 at 2336. He was transferred to [facility].  Metabolic encephalopathy - resolved  Initial symptoms with generalized weakness, pale and near passing out  Did not check BP at that time  CBG 91 at the time of onset which was low for pt  BP and glucose trending up during admission  Still has lethargy, mild perseveration and b/l asterixis  After admission, pt has hyperglycemia, hyponatremia, hyperkalemia  AKI with elevated Cre from baseline, likely due to over diuresis  L brain TIA with suspicious hypotension in the setting know left carotid stenosis, s/p IV tPA  Resultant  Expressive aphasia and right hemiplegia, resolved  MRI  No acute stroke  MRA  Left ICA and MCA decreased flow, suggesting proximal ICA high grade stenosis   Carotid Doppler  pending   CTA head and neck pending   2D Echo EF 55-60%   LDL unable to calculate due to TG 561  HgbA1c 10.2  SCDs for VTE prophylaxis Diet Carb Modified Fluid consistency:: Thin; Room service appropriate?: Yes  aspirin 81 mg daily prior to admission (taken off  plavix 2 weeks ago per cardiology), resumed aspirin and plavix 24 h  post tPA  Ongoing aggressive stroke risk factor management  Therapy recommendations:  HH PT  Disposition:  pending (lives with family PTA)  CHF   Recently discharged for CHF and fluid overload  Followed with Dr. Harl Bowie on 03/03/16  On home meds with lasix 80mg  bid, metoprolol 25mg  bid, lisinopril 40mg  daily, amlodipine 2.5mg  and terazosin 4mg   EF 55-60% this admission  Due to concerns of BP dependent stroke/TIA event, will hold off lisinopril, amlodipine and terazosin, resume half dose metoprolol but full dose of lasix  Cardiology consult requested  Hypertension  BP 198/78 on arrival  On metoprolol 25 bid, norvasc 2.5 qd, lisinopril 40 qd, terazosin 2 mg q hs and lasix 20 mg daily  resumed lasix 80 g daily  Resume half dose metoprolol 12.5 mg bid  Hold other meds  BP Stable   BP has been running low at home   Long-term BP goal 130-150 due to ICA stenosis  Cardiology consult  to help with BP and CHF managemet  L ICA occlusion  Known to pt, no intervention  Carotid doppler pending   CTA head and neck pending  BP goal 130-150 due to ICA stenosis  Consider VVS consult based on findings  Hyperlipidemia Hypertriglyceridemia  Home meds:  lipitor 80  LDL unable to calculate, goal < 70  Resume home statin  Added lopid for elevated triglycerides   Continue statin at discharge  Diabetes  HgbA1c 10.2, goal < 7.0  Uncontrolled -> better controlled  Resume home diabetes medications - high dose insulin  Metformin on hold d/t Cr >/= 1.5 males  SSI  CBG monitoring  Other Stroke Risk Factors  Advanced age  Former Cigarette smoker  Morbid obesity, Body mass index is 39.67 kg/(m^2)., recommend weight loss, diet and exercise as appropriate   Coronary artery disease - previous cath 07/2014 with LM patent, LAD ostial 40-50%, LCX mld disease, RCA occluded filling by collaterals. Unsuccesful attempt at RCA PCI.  - on DAPT since 07/2014 - plavix stopped  -  Dr. Harl Bowie following  OSA on CPAP at home  Other Active Problems  COPD on home O2 at hs  Chronic back pain on Vicodin, has used x 20 years  Hyperkalemia, resolved. K 6.1->5.9->4.8  Cr 1.3 today  Hospital day # 2  Rosalin Hawking, MD PhD Stroke Neurology 03/18/2016 6:01 PM   To contact Stroke Continuity provider, please refer to [email or web address] After hours, contact General Neurology

## 2016-03-18 NOTE — Care Management Note (Signed)
Case Management Note  Patient Details  Name: Jose [name] MRN: [id number] Date of Birth: June 18, 1951  Subjective/Objective:                 Patient admitted from home. Lives with wife in [place]. Is followed by [facility] in [place] for PCP. Uses CPAP at home, was discharged from [facility] at the end of June with home oxygen through [facility] Does not currently use HH, HH PT recommended. Patient unable to choose [facility] provider without consulting wife who is not in room at present. Patient has walker at home. Patient states that his primary insurance should be listed as VA, however it is listed as Medicare and VA is not listed at all. CM will notify financial counselor.   Action/Plan:  CM will follow for [facility] needs.   Expected Discharge Date:                  Expected Discharge Plan:  [place]  In-House Referral:     Discharge planning Services  CM Consult  Post Acute Care Choice:    Choice offered to:     DME Arranged:  N/A DME Agency:  NA  HH Arranged:    [place] Agency:     Status of Service:  In process, will continue to follow  If discussed at Long Length of Stay Meetings, dates discussed:    Additional Comments:  Carles Collet, RN 03/18/2016, 1:25 PM

## 2016-03-18 NOTE — Evaluation (Signed)
Occupational Therapy Evaluation Patient Details Name: Jose [name] MRN: [id number] DOB: [birth year] Today's Date: 03/18/2016    History of Present Illness 65 y.o. male admitted to [facility] on 03/16/16 for weakness and difficulty walking.   In the ambulance en route to the hospital he also started having difficulty speaking with R sided weakness. Dx with presumed L brain stroke s/p tPA. Stroke workup pending.  Pt with significant PMHx of HTN, COPD, DM, neuropathy, CAD, morbid obesity, low back surgery.     Clinical Impression   Pt reports he occasionally required assist with ADL PTA. Currently pt overall hands on min guard for ADL and functional mobility. Pt impulsive and has decreased awareness of safety impacting his independence and safety with ADL and functional mobility at this time. Recommending HHOT with 24/7 supervision for follow up in order to maximize independence and safety with ADL and functional mobility upon return home. Pt would benefit from continued skilled OT to address established goals.     Follow Up Recommendations  Home health OT;Supervision/Assistance - 24 hour    Equipment Recommendations  None recommended by OT    Recommendations for Other Services       Precautions / Restrictions Precautions Precautions: Fall Precaution Comments: pt, at baseline uses RW.  Remote h/o falls related to back pain.  Restrictions Weight Bearing Restrictions: No      Mobility Bed Mobility Overal bed mobility: Needs Assistance Bed Mobility: Supine to Sit     Supine to sit: Supervision     General bed mobility comments: HOB flat without use of bed rails.  Transfers Overall transfer level: Needs assistance Equipment used: None Transfers: Sit to/from Stand Sit to Stand: Min guard         General transfer comment: Min guard for safety. Pt unsteady on feet but no major LOB noted. Pt required hands on min guard assist for functional mobility.    Balance Overall balance  assessment: Needs assistance Sitting-balance support: Feet supported;No upper extremity supported Sitting balance-Leahy Scale: Good     Standing balance support: No upper extremity supported;During functional activity Standing balance-Leahy Scale: Poor                              ADL Overall ADL's : Needs assistance/impaired Eating/Feeding: Independent;Sitting   Grooming: Min guard;Standing   Upper Body Bathing: Set up;Supervision/ safety;Sitting   Lower Body Bathing: Min guard;Sit to/from stand   Upper Body Dressing : Set up;Supervision/safety;Sitting   Lower Body Dressing: Min guard;Sit to/from stand Lower Body Dressing Details (indicate cue type and reason): Pt able to pull up socks sitting EOB. Close min guard for safety with standing. Toilet Transfer: Min guard;Ambulation;Regular Glass blower/designer Details (indicate cue type and reason): Simulated by sit to stand from EOB         Functional mobility during ADLs: Min guard General ADL Comments: Pt impulsive and moves quickly. Moderately unsteady on feet but no major LOB noted during functional mobility.     Vision Additional Comments: Pt reports he upper R quad blurred when L eye occluded. Entire visual field blurred when R eye occluded. No change from baseline.   Perception     Praxis      Pertinent Vitals/Pain Pain Assessment: Faces Faces Pain Scale: Hurts even more Pain Location: lower back (chronic pain) Pain Descriptors / Indicators: Aching;Sore Pain Intervention(s): Monitored during session;Repositioned     Hand Dominance Right   Extremity/Trunk Assessment Upper Extremity  Assessment Upper Extremity Assessment: Overall WFL for tasks assessed (hx of peripheral neuropathy, numbness in bil digits)   Lower Extremity Assessment Lower Extremity Assessment: Defer to PT evaluation   Cervical / Trunk Assessment Cervical / Trunk Assessment: Other exceptions Cervical / Trunk Exceptions: h/o  chronic low back pain and surgery.    Communication Communication Communication: No difficulties   Cognition Arousal/Alertness: Awake/alert Behavior During Therapy: Impulsive Overall Cognitive Status: Impaired/Different from baseline           Safety/Judgement: Decreased awareness of safety         General Comments       Exercises       Shoulder Instructions      Home Living Family/patient expects to be discharged to:: Private residence Living Arrangements: Spouse/significant other Available Help at Discharge: Family;Available 24 hours/day Type of Home: House Home Access: Stairs to enter [employer] of Steps: 1 Entrance Stairs-Rails: None Home Layout: One level     Bathroom Shower/Tub: Occupational psychologist: Standard     Home Equipment: Environmental consultant - 2 wheels;Shower seat;Cane - single point      Lives With: Spouse    Prior Functioning/Environment Level of Independence: Needs assistance  Gait / Transfers Assistance Needed: wife reports over the past few weeks she has felt that she needs to supervise him when he is up on his feet.  ADL's / Homemaking Assistance Needed: per wife assist needed at times for ADLs        OT Diagnosis: Generalized weakness;Acute pain;Altered mental status   OT Problem List: Impaired balance (sitting and/or standing);Impaired vision/perception;Decreased safety awareness;Decreased knowledge of use of DME or AE;Decreased knowledge of precautions;Obesity;Pain   OT Treatment/Interventions: Self-care/ADL training;Energy conservation;DME and/or AE instruction;Therapeutic activities;Patient/family education;Balance training    OT Goals(Current goals can be found in the care plan section) Acute Rehab OT Goals Patient Stated Goal: return home OT Goal Formulation: With patient/family Time For Goal Achievement: 04/01/16 Potential to Achieve Goals: Good ADL Goals Pt Will Perform Grooming: with modified  independence;standing Pt Will Perform Upper Body Bathing: with modified independence;sitting Pt Will Perform Lower Body Bathing: with modified independence;sit to/from stand Pt Will Transfer to Toilet: with modified independence;ambulating;regular height toilet Pt Will Perform Toileting - Clothing Manipulation and hygiene: with modified independence;sit to/from stand Pt Will Perform Tub/Shower Transfer: Shower transfer;with supervision;ambulating;shower seat;rolling walker  OT Frequency: Min 2X/week   Barriers to D/C:            Co-evaluation              End of Session Nurse Communication: Mobility status  Activity Tolerance: Patient tolerated treatment well Patient left: in chair;with call bell/phone within reach;with family/visitor present   Time: [id number] OT Time Calculation (min): 18 min Charges:  OT General Charges $OT Visit: 1 Procedure OT Evaluation $OT Eval Moderate Complexity: 1 Procedure G-Codes:     Binnie Kand M.S., OTR/L [phone]  03/18/2016, 3:24 PM

## 2016-03-18 NOTE — Evaluation (Signed)
Speech Language Pathology Evaluation Patient Details Name: Jose [name] MRN: [id number] DOB: May 02, 1951 Today's Date: 03/18/2016 Time:  -     Problem List:  Patient Active Problem List   Diagnosis Date Noted  . Acute ischemic stroke ([place]) 03/16/2016  . Accelerated hypertension 02/25/2016  . Acute diastolic CHF (congestive heart failure) ([place]) 02/24/2016  . COPD (chronic obstructive pulmonary disease) ([place]) 02/24/2016  . Acute respiratory failure with hypoxia ([place]) 02/24/2016  . HTN (hypertension) 02/24/2016  . Diabetes ([place]) 02/24/2016  . Morbid obesity ([place]) 02/24/2016  . OSA (obstructive sleep apnea) 02/24/2016  . Elevated troponin 02/24/2016  . ACS (acute coronary syndrome) ([place]) 08/27/2014   Past Medical History:  Past Medical History  Diagnosis Date  . Hypertension   . Sleep apnea   . COPD (chronic obstructive pulmonary disease) ([place])   . Diabetes mellitus ([place])   . Neuropathy ([place])   . CAD (coronary artery disease)     a. prior silent inferior MI, NSTEMI 07/2014 with chronically occluded RCA s/p unsuccessful PCI with otherwise nonobstructive residual disease  . Morbid obesity ([place])   . Former tobacco use   . Hyperlipidemia   . Chronic respiratory failure ([place])     a. Placed on home O2 01/2016.   Past Surgical History:  Past Surgical History  Procedure Laterality Date  . Back surgery    . Left heart catheterization with coronary angiogram N/A 08/28/2014    Procedure: LEFT HEART CATHETERIZATION WITH CORONARY ANGIOGRAM;  Surgeon: Clent Demark, MD;  Location: [facility] CATH LAB;  Service: Cardiovascular;  Laterality: N/A;   HPI:  3-yo man with PMH: HTN, DM, COPD presented to [facility] ED with generalized weakness at home. Per chart pt is aphasic. Initial NIHSS score of 2 given tPA. then documented to have NIHSS score of 0. CT no acute finding or change compared to 2015.   Assessment / Plan / Recommendation Clinical Impression  Pt's expressive aphasia present on  arrival appears to have mostly ressolved. Pt given parts of bedside Western Aphasia Battery (session cut short due to pt pain). He demonstrated difficulty following multistep commands due to decreased sustained attention requesting frequent repetition of questions and some perseveration. Decreased awareness of spelling errors writing single words. Pt with 6/10 pain level during assesment. SLP will continue to treat pt for cognitive-communicative needs and continue diagnostic treatment (memory, reading).       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)   Cognition  Overall Cognitive Status: Impaired/Different from baseline Arousal/Alertness: Awake/alert Orientation Level: Oriented X4 Attention: Sustained Sustained Attention: Impaired Sustained Attention Impairment: Verbal basic Memory:  (will be assessed, session cut short d/t pain) Awareness: Impaired Awareness Impairment: Emergent impairment;Anticipatory impairment Problem Solving: Impaired Problem Solving Impairment: Verbal basic Safety/Judgment: Impaired    Comprehension  Auditory Comprehension Overall Auditory Comprehension: Appears within functional limits for tasks assessed Yes/No Questions: Within Functional Limits Commands: Impaired Multistep Basic Commands: 50-74% accurate Interfering Components: Attention;Pain;Processing speed EffectiveTechniques: Repetition Visual Recognition/Discrimination Discrimination: Not tested Reading Comprehension Reading Status:  (TBA)    Expression Expression Primary Mode of Expression: Verbal Verbal Expression Overall Verbal Expression: Impaired Initiation: No impairment Level of  Generative/Spontaneous Verbalization: Conversation Repetition: Impaired Level of Impairment:  Sentence level Naming: No impairment Pragmatics: No impairment Interfering Components: Attention Written Expression Dominant Hand: Right Written Expression: Exceptions to [facility] Self Formulation Ability: Word   Oral / Motor  Oral Motor/Sensory Function Overall Oral Motor/Sensory Function: Within functional limits Motor Speech Overall Motor Speech: Appears within functional limits for tasks assessed Respiration: Within functional limits Phonation: Normal Resonance: Within functional limits Articulation: Within functional limitis Intelligibility: Intelligible Motor Planning: Witnin functional limits   GO                    Houston Siren 03/18/2016, 11:48 AM  Orbie Pyo Colvin Caroli.[employer] [phone]

## 2016-03-19 ENCOUNTER — Ambulatory Visit: Payer: Medicare Other | Admitting: Cardiology

## 2016-03-19 DIAGNOSIS — I6522 Occlusion and stenosis of left carotid artery: Secondary | ICD-10-CM

## 2016-03-19 DIAGNOSIS — Z794 Long term (current) use of insulin: Secondary | ICD-10-CM

## 2016-03-19 DIAGNOSIS — E875 Hyperkalemia: Secondary | ICD-10-CM | POA: Diagnosis present

## 2016-03-19 LAB — CBC
HCT: 45.1 % (ref 39.0–52.0)
HEMOGLOBIN: 14.8 g/dL (ref 13.0–17.0)
MCH: 29.1 pg (ref 26.0–34.0)
MCHC: 32.8 g/dL (ref 30.0–36.0)
MCV: 88.6 fL (ref 78.0–100.0)
PLATELETS: 173 10*3/uL (ref 150–400)
RBC: 5.09 MIL/uL (ref 4.22–5.81)
RDW: 13.6 % (ref 11.5–15.5)
WBC: 7.1 10*3/uL (ref 4.0–10.5)

## 2016-03-19 LAB — BASIC METABOLIC PANEL
ANION GAP: 12 (ref 5–15)
BUN: 25 mg/dL — ABNORMAL HIGH (ref 6–20)
CHLORIDE: 96 mmol/L — AB (ref 101–111)
CO2: 25 mmol/L (ref 22–32)
CREATININE: 0.96 mg/dL (ref 0.61–1.24)
Calcium: 9.3 mg/dL (ref 8.9–10.3)
GFR calc non Af Amer: >60 mL/min (ref >60)
Glucose, Bld: 189 mg/dL — ABNORMAL HIGH (ref 65–99)
Potassium: 4 mmol/L (ref 3.5–5.1)
SODIUM: 133 mmol/L — AB (ref 135–145)

## 2016-03-19 LAB — VAS US CAROTID
LCCADDIAS: -5 cm/s
LCCADSYS: -26 cm/s
LCCAPSYS: 33 cm/s
LEFT VERTEBRAL DIAS: 7 cm/s
Left CCA prox dias: 6 cm/s
RCCADSYS: -70 cm/s
RIGHT ECA DIAS: -9 cm/s
RIGHT VERTEBRAL DIAS: 11 cm/s
Right CCA prox dias: 13 cm/s
Right CCA prox sys: 64 cm/s

## 2016-03-19 LAB — GLUCOSE, CAPILLARY
GLUCOSE-CAPILLARY: 210 mg/dL — AB (ref 65–99)
GLUCOSE-CAPILLARY: 238 mg/dL — AB (ref 65–99)
GLUCOSE-CAPILLARY: 214 mg/dL — AB (ref 65–99)
GLUCOSE-CAPILLARY: 188 mg/dL — AB (ref 65–99)

## 2016-03-19 MED ORDER — INSULIN GLARGINE 100 UNIT/ML ~~LOC~~ SOLN
60.0000 [IU] | Freq: Every day | SUBCUTANEOUS | Status: DC
  Administered 2016-03-19 – 2016-03-22 (×3): 60 [IU] via SUBCUTANEOUS
  Filled 2016-03-19 (×6): qty 0.6

## 2016-03-19 MED ORDER — INSULIN GLARGINE 100 UNIT/ML ~~LOC~~ SOLN
50.0000 [IU] | Freq: Every day | SUBCUTANEOUS | Status: DC
  Administered 2016-03-20 – 2016-03-23 (×4): 50 [IU] via SUBCUTANEOUS
  Filled 2016-03-19 (×4): qty 0.5

## 2016-03-19 MED ORDER — METFORMIN HCL 500 MG PO TABS
1000.0000 mg | ORAL_TABLET | Freq: Two times a day (BID) | ORAL | Status: DC
  Administered 2016-03-19 – 2016-03-23 (×7): 1000 mg via ORAL
  Filled 2016-03-19 (×7): qty 2

## 2016-03-19 NOTE — Progress Notes (Signed)
Inpatient Diabetes Program Recommendations  AACE/ADA: New Consensus Statement on Inpatient Glycemic Control (2015)  Target Ranges:  Prepandial:   less than 140 mg/dL      Peak postprandial:   less than 180 mg/dL (1-2 hours)      Critically ill patients:  140 - 180 mg/dL   Lab Results  Component Value Date   GLUCAP 238* 03/19/2016   HGBA1C 10.2* 03/17/2016    Review of Glycemic Control:  Results for Jose [name], Jose [name] (MRN [id number]) as of 03/19/2016 13:33  Ref. Range 03/18/2016 11:21 03/18/2016 16:55 03/18/2016 21:20 03/19/2016 06:44 03/19/2016 11:18  Glucose-Capillary Latest Ref Range: 65-99 mg/dL 293 (H) 160 (H) 193 (H) 210 (H) 238 (H)  Results for AEDON, PERCELL (MRN [id number]) as of 03/19/2016 13:33  Ref. Range 03/17/2016 04:53  Hemoglobin A1C Latest Ref Range: 4.8-5.6 % 10.2 (H)   Diabetes history: Type 2 diabetes Outpatient Diabetes medications: Glucotrol 10 mg AM and 5 mg with lunch and dinner, Novolog 30 units tid with meals, Lantus 30 units AM and 60 units q PM Current orders for Inpatient glycemic control:  Novolog 30 units tid with meals, Lantus 30 units AM and 60 units q PM, Novolog moderate tid with meals  Inpatient Diabetes Program Recommendations:    Note that blood sugars continue to be greater than goal.  Please increase morning dose of Lantus to 50 units q AM.    Thanks, Adah Perl, RN, BC-ADM Inpatient Diabetes Coordinator Pager [phone] (8a-5p)

## 2016-03-19 NOTE — Progress Notes (Signed)
Patient refuses to walk with nurse x2. Educated, still refused. Will contnue to monitor

## 2016-03-19 NOTE — Consult Note (Signed)
Hospital Consult    Reason for Consult:  Carotid artery stenosis Referring Physician:  Erlinda Hong MRN #:  [id number]  History of Present Illness: This is a 65 y.o. male who presented to the hospital 3 days ago with acute stroke en route to the hospital after EMS was called for pt having difficulty standing.  He was found to be a good candidate and underwent tPA.  He is completely back to baseline at this time.     At baseline, he walks with a walker due to diabetic neuropathy.  He also has significant visual issues due to diabetes and has had a laser tx in one eye with plans for additional tx's at the [place].  He reportedly has glaucoma,  which will be evaluated at an upcoming appointment.  He was admitted at the end of June for acute diastolic CHF exacerbation and was diuresed and discharged on po Lasix and fluid restriction.  He was taken off Plavix at the beginning of the month (03/03/16) and left on aspirin at his cardiology visit.  His wife states that they had put him back on some blood pressure medication and he started having dizziness and weakness after his blood pressure was dropping from the medication.    He does have COPD as well as sleep apnea.  He has CAD with a hx of MI in 2015.  He is on insulin for his diabetes.  His hgb A1c is 10.2.   He is on a statin for cholesterol management.  He is on a beta blocker and ACEI for CHF & hypertension. He says he smoke for greater than 40 years, but has been quit for 7 years. He states that he had a heart catheterization in the past and it was not successful.   Past Medical History  Diagnosis Date  . Hypertension   . Sleep apnea   . COPD (chronic obstructive pulmonary disease) ([place])   . Diabetes mellitus ([place])   . Neuropathy ([place])   . CAD (coronary artery disease)     a. prior silent inferior MI, NSTEMI 07/2014 with chronically occluded RCA s/p unsuccessful PCI with otherwise nonobstructive residual disease  . Morbid obesity ([place])   . Former tobacco  use   . Hyperlipidemia   . Chronic respiratory failure ([place])     a. Placed on home O2 01/2016.    Past Surgical History  Procedure Laterality Date  . Back surgery    . Left heart catheterization with coronary angiogram N/A 08/28/2014    Procedure: LEFT HEART CATHETERIZATION WITH CORONARY ANGIOGRAM;  Surgeon: Clent Demark, MD;  Location: [facility] CATH LAB;  Service: Cardiovascular;  Laterality: N/A;    No Known Allergies  Prior to Admission medications   Medication Sig Start Date End Date Taking? Authorizing Provider  amLODipine (NORVASC) 2.5 MG tablet Take 1 tablet (2.5 mg total) by mouth daily. 02/27/16  Yes Kathie Dike, MD  aspirin 81 MG tablet Take 81 mg by mouth daily.   Yes Historical Provider, MD  atorvastatin (LIPITOR) 80 MG tablet Take 1 tablet (80 mg total) by mouth daily at 6 PM. 08/29/14  Yes Charolette Forward, MD  ergocalciferol (VITAMIN D2) 50000 UNITS capsule Take 50,000 Units by mouth See admin instructions. Take one twice a week on wednesdays and saturdays   Yes Historical Provider, MD  ferrous sulfate 325 (65 FE) MG tablet Take 325 mg by mouth 3 (three) times daily with meals.   Yes Historical Provider, MD  furosemide (LASIX) 80 MG tablet  Take 1 tablet (80 mg total) by mouth 2 (two) times daily. 02/27/16  Yes Kathie Dike, MD  glipiZIDE (GLUCOTROL) 5 MG tablet Take 5-10 mg by mouth 3 (three) times daily. 10mg  in the morning, 5mg  in the afternoon, 5mg  in the evening   Yes Historical Provider, MD  insulin aspart (NOVOLOG) 100 UNIT/ML injection Inject 30 Units into the skin 3 (three) times daily before meals.    Yes Historical Provider, MD  insulin glargine (LANTUS) 100 UNIT/ML injection Inject 30-60 Units into the skin See admin instructions. 30 units in the AM and 60 units in the PM   Yes Historical Provider, MD  lisinopril (PRINIVIL,ZESTRIL) 40 MG tablet Take 40 mg by mouth daily.   Yes Historical Provider, MD  magnesium oxide (MAG-OX) 400 MG tablet Take 1 tablet (400 mg total)  by mouth daily. 03/04/16  Yes Arnoldo Lenis, MD  metFORMIN (GLUCOPHAGE) 1000 MG tablet Take 1 tablet (1,000 mg total) by mouth 2 (two) times daily with a meal. 08/30/14  Yes Charolette Forward, MD  metoprolol tartrate (LOPRESSOR) 25 MG tablet Take 1 tablet (25 mg total) by mouth 2 (two) times daily. 03/03/16  Yes Arnoldo Lenis, MD  Multiple Vitamins-Minerals (ICAPS AREDS 2 PO) Take 1 capsule by mouth 2 (two) times daily.    Yes Historical Provider, MD  nitroGLYCERIN (NITROSTAT) 0.4 MG SL tablet Place 1 tablet (0.4 mg total) under the tongue every 5 (five) minutes x 3 doses as needed for chest pain. 08/29/14  Yes Charolette Forward, MD  Omega-3 Fatty Acids (FISH OIL) 1000 MG CAPS Take 2-3 capsules by mouth 3 (three) times daily. Take 7 tables a day to lower triglycerides per list 2 tablets in the morning, 3 tablets at noon, 2 tablets at bedtime   Yes Historical Provider, MD  oxyCODONE-acetaminophen (PERCOCET/ROXICET) 5-325 MG per tablet Take 2 tablets by mouth every 4 (four) hours. Take eight tablets daily as needed for pain per family and list. For bad  Back pain   Yes Historical Provider, MD  potassium chloride SA (K-DUR,KLOR-CON) 20 MEQ tablet Take 1 tablet (20 mEq total) by mouth 2 (two) times daily. 02/27/16  Yes Kathie Dike, MD  sertraline (ZOLOFT) 100 MG tablet Take 100 mg by mouth daily.   Yes Historical Provider, MD  terazosin (HYTRIN) 2 MG capsule Take 4 mg by mouth every evening.   Yes Historical Provider, MD  tiotropium (SPIRIVA) 18 MCG inhalation capsule Place 18 mcg into inhaler and inhale at bedtime.    Yes Historical Provider, MD  vitamin B-12 (CYANOCOBALAMIN) 1000 MCG tablet Take 1,000 mcg by mouth daily.   Yes Historical Provider, MD    Social History   Social History  . Marital Status: Unknown    Spouse Name: N/A  . Number of Children: N/A  . Years of Education: N/A   Occupational History  . Not on file.   Social History Main Topics  . Smoking status: Former Smoker -- 3.00  packs/day for 45 years    Types: Cigarettes  . Smokeless tobacco: Not on file  . Alcohol Use: No  . Drug Use: Not on file  . Sexual Activity: Not on file   Other Topics Concern  . Not on file   Social History Narrative     History reviewed. No pertinent family history.  ROS: [x]  Positive   [ ]  Negative   [ ]  All sytems reviewed and are negative  Cardiovascular: []  chest pain/pressure []  palpitations []  SOB lying flat []   DOE []  pain in legs while walking []  pain in legs at rest []  pain in legs at night []  non-healing ulcers []  hx of DVT []  swelling in legs  Pulmonary: []  productive cough []  asthma/wheezing []  home O2 [x]  OSA  Neurologic: []  weakness in []  arms []  legs []  numbness in []  arms []  legs [x]  hx of CVA []  mini stroke [x] difficulty speaking or slurred speech []  temporary loss of vision in one eye [x]  dizziness  Hematologic: []  hx of cancer []  bleeding problems []  problems with blood clotting easily  Endocrine:   [x]  diabetes with neuropathy and visual changes []  thyroid disease  GI []  vomiting blood []  blood in stool  GU: []  CKD/renal failure []  HD--[]  M/W/F or []  T/T/S []  burning with urination []  blood in urine  Psychiatric: []  anxiety []  depression  Musculoskeletal: []  arthritis []  joint pain  Integumentary: []  rashes [x]  ulcer left foot and right plantar callous   Constitutional: []  fever []  chills   Physical Examination  Filed Vitals:   03/19/16 0904 03/19/16 1024  BP: 159/47 145/72  Pulse: 71 72  Temp:  98.1 F (36.7 C)  Resp:  14   Body mass index is 39.67 kg/(m^2).  General:  WDWN in NAD Gait: Not observed HENT: WNL, normocephalic Pulmonary: normal non-labored breathing, without Rales, rhonchi,  wheezing Cardiac: regular, without  Murmurs, rubs or gallops; without carotid bruits Abdomen: obese; soft, NT/ND, no masses Skin: without rashes Vascular Exam/Pulses:  Right Left  Radial 2+ (normal) 2+ (normal)    Ulnar 2+ (normal) 2+ (normal)  DP + doppler signal +doppler signal  PT + doppler signal + doppler signal   Extremities: without ischemic changes, without Gangrene , without cellulitis; without open wounds;  Musculoskeletal: no muscle wasting or atrophy  Neurologic: A&O X 3; SENSATION: normal; MOTOR FUNCTION:  moving all extremities equally. Speech is fluent/normal Psychiatric:  Normal affect   CBC    Component Value Date/Time   WBC 7.1 03/19/2016 0450   RBC 5.09 03/19/2016 0450   HGB 14.8 03/19/2016 0450   HCT 45.1 03/19/2016 0450   PLT 173 03/19/2016 0450   MCV 88.6 03/19/2016 0450   MCH 29.1 03/19/2016 0450   MCHC 32.8 03/19/2016 0450   RDW 13.6 03/19/2016 0450   LYMPHSABS 1.5 03/16/2016 2230   MONOABS 0.9 03/16/2016 2230   EOSABS 0.1 03/16/2016 2230   BASOSABS 0.0 03/16/2016 2230    BMET    Component Value Date/Time   NA 133* 03/19/2016 0450   K 4.0 03/19/2016 0450   CL 96* 03/19/2016 0450   CO2 25 03/19/2016 0450   GLUCOSE 189* 03/19/2016 0450   BUN 25* 03/19/2016 0450   CREATININE 0.96 03/19/2016 0450   CALCIUM 9.3 03/19/2016 0450   GFRNONAA >60 03/19/2016 0450   GFRAA >60 03/19/2016 0450    COAGS: Lab Results  Component Value Date   INR 1.01 03/16/2016   INR 1.03 08/28/2014     Non-Invasive Vascular Imaging:   CTA 03/18/16: IMPRESSION: 1. Bulky and confluent calcification at the left carotid bifurcation and involving the proximal 3.5 cm of the left ICA with tandem RADIOGRAPHIC STRING SIGN stenosis. Despite this the left ICA remains patent. There is downstream mild to moderate cavernous segment stenosis related to extensive ICA siphon calcified plaque. 2. Lesser right carotid calcified plaque with mild to moderate right siphon anterior genu stenosis. 3. Moderate to severe bilateral ACA A1 and right A2 segment stenoses. 4. Posterior circulation calcified plaque. High-grade (75%) proximal left  subclavian artery stenosis. No significant left  vertebral artery stenosis. Moderate or severe right vertebral artery origin and right V4 segment stenosis. 5. Stable CT appearance of the brain.  Statin:  Yes.   Beta Blocker:  Yes.   Aspirin:  Yes.   ACEI:  Yes.   ARB:  No. Other antiplatelets/anticoagulants:  Yes.   Plavix   ASSESSMENT/PLAN: This is a 65 y.o. male with symptomatic left carotid artery stenosis.     -pt is back to base line at this point.  His lesion on CT scan is high and my not be surgically accessible.  His carotid is heavily calcified and may not be a stent candidate due to this.   -will schedule him for a CT angiogram on Monday, March 22, 2016 to determine the best option for this pt. -pt may discharge from vascular standpoint and come in Monday for procedure. -continue Plavix/aspirin. -systolic BP running 0000000, which is preferable than lower systolic pressure given the high degree of carotid stenosis.   Leontine Locket, PA-C [facility] [phone]  I agree with the above.  I have seen and evaluated the patient.  The most likely explanation for his above events is the high-grade stenosis within the left carotid artery.  I have reviewed his CT scan.  The calcification within the artery extended well up above the angle of the mandible and appears to be surgically inaccessible.  I believe the next step is to proceed with carotid angiography to confirm patency of the left carotid artery as well as to determine if his anatomy is suitable for carotid stenting.  This will be done on Monday.  I will continue with dual antiplatelet therapy.  Annamarie Major

## 2016-03-19 NOTE — Progress Notes (Signed)
Patient ID: Jose [name], male   DOB: 1951/06/20, 65 y.o.   MRN: [id number]    Subjective:  Denies SSCP, palpitations or Dyspnea Going for carotid US   Objective:  Filed Vitals:   03/18/16 2211 03/19/16 0056 03/19/16 0904 03/19/16 1024  BP: 161/66 156/66 159/47 145/72  Pulse: 87 72 71 72  Temp: 98.7 F (37.1 C) 98.5 F (36.9 C)  98.1 F (36.7 C)  TempSrc: Oral Oral  Oral  Resp: 20 20  14   Height:      Weight:      SpO2: 94% 93%  99%    Intake/Output from previous day:  Intake/Output Summary (Last 24 hours) at 03/19/16 1102 Last data filed at 03/18/16 1700  Gross per 24 hour  Intake    720 ml  Output      0 ml  Net    720 ml    Physical Exam: General: Well developed, well nourished, male in no acute distress Head: Eyes PERRLA, No xanthomas. Normocephalic and atraumatic, oropharynx without edema or exudate.  Lungs: Resp regular and unlabored, CTA. Heart: RRR no s3, s4, or murmurs..  Neck: left carotid bruits. No lymphadenopathy. No JVD. Abdomen: Bowel sounds present, abdomen soft and non-tender without masses or hernias noted. Msk: No spine or cva tenderness. No weakness, no joint deformities or effusions. Extremities: No clubbing, cyanosis. Trace BL LE edema. DP/PT/Radials 2+ and equal bilaterally. Neuro: Alert and oriented X 3. No focal deficits noted. Psych: Good affect, responds appropriately Skin: No rashes or lesions noted.  Lab Results: Basic Metabolic Panel:  Recent Labs  03/18/16 0213 03/19/16 0450  NA 133* 133*  K 4.8 4.0  CL 98* 96*  CO2 26 25  GLUCOSE 167* 189*  BUN 36* 25*  CREATININE 1.30* 0.96  CALCIUM 9.1 9.3   Liver Function Tests:  Recent Labs  03/16/16 2230  AST 28  ALT 30  ALKPHOS 56  BILITOT 0.5  PROT 8.1  ALBUMIN 4.1   No results for input(s): LIPASE, AMYLASE in the last 72 hours. CBC:  Recent Labs  03/16/16 2230  03/18/16 0213 03/19/16 0450  WBC 11.6*  --  8.5 7.1  NEUTROABS 9.1*  --   --   --   HGB 15.6  <  > 13.5 14.8  HCT 45.6  < > 41.2 45.1  MCV 88.4  --  89.0 88.6  PLT 195  --  179 173  < > = values in this interval not displayed. Hemoglobin A1C:  Recent Labs  03/17/16 0453  HGBA1C 10.2*   Fasting Lipid Panel:  Recent Labs  03/17/16 0453  CHOL 168  HDL 26*  LDLCALC UNABLE TO CALCULATE IF TRIGLYCERIDE OVER 400 mg/dL  TRIG 561*  CHOLHDL 6.5   Imaging: Ct Angio Head W Or Wo Contrast  03/18/2016  CLINICAL DATA:  65 year old male with symptomatic left carotid stenosis. Initial encounter. EXAM: CT ANGIOGRAPHY HEAD AND NECK TECHNIQUE: Multidetector CT imaging of the head and neck was performed using the standard protocol during bolus administration of intravenous contrast. Multiplanar CT image reconstructions and MIPs were obtained to evaluate the vascular anatomy. Carotid stenosis measurements (when applicable) are obtained utilizing NASCET criteria, using the distal internal carotid diameter as the denominator. CONTRAST:  50 mL Isovue 370 COMPARISON:  Brain MRI and intracranial MRA a 03/17/2016. Head CT without contrast 03/16/2016. FINDINGS: CT HEAD Brain: Stable gray-white matter differentiation throughout the brain. No cortically based acute infarct identified. No acute intracranial hemorrhage identified. No midline shift,  mass effect, or evidence of intracranial mass lesion. No ventriculomegaly. Calvarium and skull base: Stable and intact. Paranasal sinuses: Stable and well pneumatized. Sphenoid sinus chronic periosteal thickening. Orbits: Negative orbits and scalp. CTA NECK Skeleton: Absent maxillary dentition. Degenerative changes in the lower cervical and upper thoracic spine. No acute osseous abnormality identified. Other neck: Mild respiratory motion artifact, some centrilobular emphysema suspected. No superior mediastinal lymphadenopathy. Negative thyroid, larynx, pharynx, parapharyngeal spaces, retropharyngeal space, sublingual space, submandibular glands, and parotid glands. No  cervical lymphadenopathy. Aortic arch: 3 vessel arch configuration with mild arch but moderate great vessel origin calcified atherosclerosis. Right carotid system: No brachiocephalic artery or right CCA origin stenosis despite calcified plaque. Soft and calcified plaque at the right carotid bifurcation extending to the right ICA bulb. Less than 50 % stenosis with respect to the distal vessel. Left carotid system: No left CCA origin stenosis despite calcified plaque. Partially retropharyngeal positioning of the left carotid bifurcation. Bulky 11 mm calcification occupying the left carotid bifurcation with intermittent radiographic string sign stenosis at the left ICA origin and bulb (series 501, images 88 and 93). Despite this the left ICA remains patent, but is somewhat diminutive distal to the calcified plaque which occupies the first 3.5 cm of the vessel. Mild tortuosity of the left ICA just below the skullbase. Vertebral arteries: No proximal right subclavian artery stenosis. Calcified plaque at the right vertebral artery origin. With up to moderate to severe stenosis, but the right vertebral remains patent. The cervical right vertebral artery otherwise is negative. Calcified plaque at the left subclavian artery origin and affecting the proximal 2 cm of the vessel with up to 75 % stenosis with respect to the distal vessel. Normal left vertebral artery origin, although there is V1 segment calcified plaque which does not appear hemodynamically significant. Intermittent calcified left V2 segment plaque without stenosis. CTA HEAD Posterior circulation: Right vertebral artery V3 segment calcified plaque with up to 50% stenosis. Bilateral V4 segment calcified plaque with up to moderate stenosis on the right. The distal right vertebral artery also is diminutive beyond the patent right PICA origin. The left PICA origin is normal. Patent vertebrobasilar junction. Irregular basilar artery without stenosis. SCA and left PCA  origins are normal. Fetal type right PCA origin. Bilateral PCA branches are within normal limits. Anterior circulation: Both ICA siphons are patent with moderate to severe calcified plaque. Still, there is mild to moderate stenosis of the distal left cavernous segment and right ICA anterior genu. Normal ophthalmic and posterior communicating artery origins. Patent carotid termini. Normal MCA origins. Diminutive bilateral ACA A1 segments where as the anterior communicating artery and proximal A2 segments appear somewhat larger and ectatic. Still there is no discrete saccular aneurysm associated. The mid right ACA A2 segment and appears moderately irregular and stenotic. No major ACA branch occlusion identified. Left MCA M1 segment, bifurcation, and left MCA branches are within normal limits. Right MCA M1 segment, bifurcation, and right MCA branches are within normal limits. Venous sinuses: Patent. Anatomic variants: None. Delayed phase: No abnormal enhancement identified. IMPRESSION: 1. Bulky and confluent calcification at the left carotid bifurcation and involving the proximal 3.5 cm of the left ICA with tandem RADIOGRAPHIC STRING SIGN stenosis. Despite this the left ICA remains patent. There is downstream mild to moderate cavernous segment stenosis related to extensive ICA siphon calcified plaque. 2. Lesser right carotid calcified plaque with mild to moderate right siphon anterior genu stenosis. 3. Moderate to severe bilateral ACA A1 and right A2 segment stenoses. 4.  Posterior circulation calcified plaque. High-grade (75%) proximal left subclavian artery stenosis. No significant left vertebral artery stenosis. Moderate or severe right vertebral artery origin and right V4 segment stenosis. 5.  Stable CT appearance of the brain. Electronically Signed   By: Genevie Ann M.D.   On: 03/18/2016 21:38   Ct Angio Neck W Or Wo Contrast  03/18/2016  CLINICAL DATA:  65 year old male with symptomatic left carotid stenosis.  Initial encounter. EXAM: CT ANGIOGRAPHY HEAD AND NECK TECHNIQUE: Multidetector CT imaging of the head and neck was performed using the standard protocol during bolus administration of intravenous contrast. Multiplanar CT image reconstructions and MIPs were obtained to evaluate the vascular anatomy. Carotid stenosis measurements (when applicable) are obtained utilizing NASCET criteria, using the distal internal carotid diameter as the denominator. CONTRAST:  50 mL Isovue 370 COMPARISON:  Brain MRI and intracranial MRA a 03/17/2016. Head CT without contrast 03/16/2016. FINDINGS: CT HEAD Brain: Stable gray-white matter differentiation throughout the brain. No cortically based acute infarct identified. No acute intracranial hemorrhage identified. No midline shift, mass effect, or evidence of intracranial mass lesion. No ventriculomegaly. Calvarium and skull base: Stable and intact. Paranasal sinuses: Stable and well pneumatized. Sphenoid sinus chronic periosteal thickening. Orbits: Negative orbits and scalp. CTA NECK Skeleton: Absent maxillary dentition. Degenerative changes in the lower cervical and upper thoracic spine. No acute osseous abnormality identified. Other neck: Mild respiratory motion artifact, some centrilobular emphysema suspected. No superior mediastinal lymphadenopathy. Negative thyroid, larynx, pharynx, parapharyngeal spaces, retropharyngeal space, sublingual space, submandibular glands, and parotid glands. No cervical lymphadenopathy. Aortic arch: 3 vessel arch configuration with mild arch but moderate great vessel origin calcified atherosclerosis. Right carotid system: No brachiocephalic artery or right CCA origin stenosis despite calcified plaque. Soft and calcified plaque at the right carotid bifurcation extending to the right ICA bulb. Less than 50 % stenosis with respect to the distal vessel. Left carotid system: No left CCA origin stenosis despite calcified plaque. Partially retropharyngeal  positioning of the left carotid bifurcation. Bulky 11 mm calcification occupying the left carotid bifurcation with intermittent radiographic string sign stenosis at the left ICA origin and bulb (series 501, images 88 and 93). Despite this the left ICA remains patent, but is somewhat diminutive distal to the calcified plaque which occupies the first 3.5 cm of the vessel. Mild tortuosity of the left ICA just below the skullbase. Vertebral arteries: No proximal right subclavian artery stenosis. Calcified plaque at the right vertebral artery origin. With up to moderate to severe stenosis, but the right vertebral remains patent. The cervical right vertebral artery otherwise is negative. Calcified plaque at the left subclavian artery origin and affecting the proximal 2 cm of the vessel with up to 75 % stenosis with respect to the distal vessel. Normal left vertebral artery origin, although there is V1 segment calcified plaque which does not appear hemodynamically significant. Intermittent calcified left V2 segment plaque without stenosis. CTA HEAD Posterior circulation: Right vertebral artery V3 segment calcified plaque with up to 50% stenosis. Bilateral V4 segment calcified plaque with up to moderate stenosis on the right. The distal right vertebral artery also is diminutive beyond the patent right PICA origin. The left PICA origin is normal. Patent vertebrobasilar junction. Irregular basilar artery without stenosis. SCA and left PCA origins are normal. Fetal type right PCA origin. Bilateral PCA branches are within normal limits. Anterior circulation: Both ICA siphons are patent with moderate to severe calcified plaque. Still, there is mild to moderate stenosis of the distal left cavernous segment and  right ICA anterior genu. Normal ophthalmic and posterior communicating artery origins. Patent carotid termini. Normal MCA origins. Diminutive bilateral ACA A1 segments where as the anterior communicating artery and proximal  A2 segments appear somewhat larger and ectatic. Still there is no discrete saccular aneurysm associated. The mid right ACA A2 segment and appears moderately irregular and stenotic. No major ACA branch occlusion identified. Left MCA M1 segment, bifurcation, and left MCA branches are within normal limits. Right MCA M1 segment, bifurcation, and right MCA branches are within normal limits. Venous sinuses: Patent. Anatomic variants: None. Delayed phase: No abnormal enhancement identified. IMPRESSION: 1. Bulky and confluent calcification at the left carotid bifurcation and involving the proximal 3.5 cm of the left ICA with tandem RADIOGRAPHIC STRING SIGN stenosis. Despite this the left ICA remains patent. There is downstream mild to moderate cavernous segment stenosis related to extensive ICA siphon calcified plaque. 2. Lesser right carotid calcified plaque with mild to moderate right siphon anterior genu stenosis. 3. Moderate to severe bilateral ACA A1 and right A2 segment stenoses. 4. Posterior circulation calcified plaque. High-grade (75%) proximal left subclavian artery stenosis. No significant left vertebral artery stenosis. Moderate or severe right vertebral artery origin and right V4 segment stenosis. 5.  Stable CT appearance of the brain. Electronically Signed   By: Genevie Ann M.D.   On: 03/18/2016 21:38   Mr Brain Wo Contrast  03/17/2016  CLINICAL DATA:  Acute onset aphasia and right-sided weakness. EXAM: MRI HEAD WITHOUT CONTRAST MRA HEAD WITHOUT CONTRAST TECHNIQUE: Multiplanar, multiecho pulse sequences of the brain and surrounding structures were obtained without intravenous contrast. Angiographic images of the head were obtained using MRA technique without contrast. COMPARISON:  CT head without contrast 03/16/2016 FINDINGS: MRI HEAD FINDINGS The diffusion-weighted images demonstrate no evidence for acute or subacute infarction. No acute hemorrhage or mass lesion is present. Mild generalized atrophy is  present. Moderate periventricular and subcortical white matter changes bilaterally are asymmetric on the left. Ventricles are proportionate to the degree of atrophy. A remote lacunar infarct is present in the right thalamus. White matter changes extend into the brainstem. The cerebellum is unremarkable. The internal auditory canals are normal. Flow is present in the major intracranial arteries. The globes and orbits are intact. MRA HEAD FINDINGS There is moderate tortuosity of the cervical left internal carotid artery. Overall signal is diminished in the left internal carotid artery compared to the right. Atherosclerotic irregularity is present within the anterior cavernous internal carotid artery bilaterally. The right internal carotid artery is otherwise normal. A fetal type right posterior cerebral artery is present. There is mild narrowing of the A1 segments bilaterally. An azygos A2 segment is present. The MCA bifurcations are intact bilaterally. There is decreased flow signal in the left MCA branches compared to the right. Distal small vessel disease is present bilaterally. The left vertebral artery is slightly dominant to the right. PICA origins are visualized and normal. The basilar artery is within normal limits. The left posterior cerebral artery originates from the basilar tip. The right posterior cerebral artery is of fetal type with a small right P1 segment. Mild attenuation of distal PCA branch vessels is noted bilaterally. IMPRESSION: 1. No acute infarct. 2. Periventricular and subcortical white matter disease bilaterally is asymmetric on the left. 3. Asymmetric decreased flow signal within a tortuous left internal carotid artery and asymmetric flow signal in the left MCA branch vessels. This suggests a more proximal stenosis. Carotid bifurcation analysis is advised. 4. Mild distal small vessel disease without a  significant proximal stenosis, aneurysm, or branch vessel occlusion within either the  anterior or posterior circulation. Electronically Signed   By: San Morelle M.D.   On: 03/17/2016 21:21   Dg Chest Port 1 View  03/17/2016  CLINICAL DATA:  Shortness of breath for 1 day EXAM: PORTABLE CHEST 1 VIEW COMPARISON:  02/24/2016 FINDINGS: Bilateral diffuse mild interstitial thickening. There is no focal parenchymal opacity. There is no pleural effusion or pneumothorax. There is stable cardiomegaly. The osseous structures are unremarkable. IMPRESSION: Cardiomegaly with mild pulmonary vascular congestion. Electronically Signed   By: Kathreen Devoid   On: 03/17/2016 15:38   Mr Jodene Nam Head/brain Wo Cm  03/17/2016  CLINICAL DATA:  Acute onset aphasia and right-sided weakness. EXAM: MRI HEAD WITHOUT CONTRAST MRA HEAD WITHOUT CONTRAST TECHNIQUE: Multiplanar, multiecho pulse sequences of the brain and surrounding structures were obtained without intravenous contrast. Angiographic images of the head were obtained using MRA technique without contrast. COMPARISON:  CT head without contrast 03/16/2016 FINDINGS: MRI HEAD FINDINGS The diffusion-weighted images demonstrate no evidence for acute or subacute infarction. No acute hemorrhage or mass lesion is present. Mild generalized atrophy is present. Moderate periventricular and subcortical white matter changes bilaterally are asymmetric on the left. Ventricles are proportionate to the degree of atrophy. A remote lacunar infarct is present in the right thalamus. White matter changes extend into the brainstem. The cerebellum is unremarkable. The internal auditory canals are normal. Flow is present in the major intracranial arteries. The globes and orbits are intact. MRA HEAD FINDINGS There is moderate tortuosity of the cervical left internal carotid artery. Overall signal is diminished in the left internal carotid artery compared to the right. Atherosclerotic irregularity is present within the anterior cavernous internal carotid artery bilaterally. The right  internal carotid artery is otherwise normal. A fetal type right posterior cerebral artery is present. There is mild narrowing of the A1 segments bilaterally. An azygos A2 segment is present. The MCA bifurcations are intact bilaterally. There is decreased flow signal in the left MCA branches compared to the right. Distal small vessel disease is present bilaterally. The left vertebral artery is slightly dominant to the right. PICA origins are visualized and normal. The basilar artery is within normal limits. The left posterior cerebral artery originates from the basilar tip. The right posterior cerebral artery is of fetal type with a small right P1 segment. Mild attenuation of distal PCA branch vessels is noted bilaterally. IMPRESSION: 1. No acute infarct. 2. Periventricular and subcortical white matter disease bilaterally is asymmetric on the left. 3. Asymmetric decreased flow signal within a tortuous left internal carotid artery and asymmetric flow signal in the left MCA branch vessels. This suggests a more proximal stenosis. Carotid bifurcation analysis is advised. 4. Mild distal small vessel disease without a significant proximal stenosis, aneurysm, or branch vessel occlusion within either the anterior or posterior circulation. Electronically Signed   By: San Morelle M.D.   On: 03/17/2016 21:21    Cardiac Studies:  ECG:  SR rate 80 IVCD no acute changes    Echo: - Left ventricle: Poor acoustic windows limit study Definity used  to optimize Overall LVEF appears normal at 55 to 60% The cavity  size was normal. Wall thickness was increased in a pattern of  moderate LVH. Doppler parameters are consistent with abnormal  left ventricular relaxation (grade 1 diastolic dysfunction). - Left atrium: The atrium was moderately dilated.  Medications:   . aspirin  81 mg Oral Daily  . atorvastatin  80 mg  Oral q1800  . clopidogrel  75 mg Oral Daily  . ferrous sulfate  325 mg Oral TID WC  .  furosemide  80 mg Oral BID  . gemfibrozil  600 mg Oral BID AC  . glipiZIDE  10 mg Oral Q breakfast  . glipiZIDE  5 mg Oral BID AC  . insulin aspart  0-15 Units Subcutaneous TID AC & HS  . insulin aspart  30 Units Subcutaneous TID AC  . insulin glargine  30 Units Subcutaneous Daily   And  . insulin glargine  60 Units Subcutaneous QHS  . magnesium oxide  400 mg Oral Daily  . metoprolol tartrate  12.5 mg Oral BID  . mupirocin cream   Topical Daily  . oxyCODONE-acetaminophen  2 tablet Oral Q6H  . pantoprazole  40 mg Oral Daily  . sertraline  100 mg Oral Daily  . tiotropium  18 mcg Inhalation QHS       Assessment/Plan:  TIA:  CTA with string sign on left ICA.  Carotid duplex pending Needs VVS consult for potential left CEA continue ASA/Plavix BP:  In good range continue to hold ACE/amlodipine given apparent flow related lesion in left ICA CAD:  Stable no angina  DM:  Discussed low carb diet.  Target hemoglobin A1c is 6.5 or less.  Continue current medications. Chol:  On statin   Jenkins Rouge 03/19/2016, 11:02 AM

## 2016-03-19 NOTE — Progress Notes (Signed)
Patient stated that he would be able to apply CPAP on his own tonight.  RT checked water chamber and adjusted mask at patients request. RT will continue to monitor.

## 2016-03-19 NOTE — Progress Notes (Signed)
STROKE TEAM PROGRESS NOTE   SUBJECTIVE (INTERVAL HISTORY) Wife at the bedside. Will consult VVS for possible vascular intervention in setting of stroke symptoms with likely severe left ICA stenosis on MRA head and CTA neck. Pt neuro stable, BP in good range but glucose still high. DM education RN recommended increase morning insulin to 50U.     OBJECTIVE Temp:  [98 F (36.7 C)-98.8 F (37.1 C)] 98.1 F (36.7 C) (07/21 1024) Pulse Rate:  [71-95] 72 (07/21 1024) Cardiac Rhythm:  [-] Normal sinus rhythm (07/21 0843) Resp:  [14-20] 14 (07/21 1024) BP: (145-161)/(47-72) 145/72 mmHg (07/21 1024) SpO2:  [93 %-99 %] 99 % (07/21 1024)  CBC:   Recent Labs Lab 03/16/16 2230  03/18/16 0213 03/19/16 0450  WBC 11.6*  --  8.5 7.1  NEUTROABS 9.1*  --   --   --   HGB 15.6  < > 13.5 14.8  HCT 45.6  < > 41.2 45.1  MCV 88.4  --  89.0 88.6  PLT 195  --  179 173  < > = values in this interval not displayed.  Basic Metabolic Panel:   Recent Labs Lab 03/18/16 0213 03/19/16 0450  NA 133* 133*  K 4.8 4.0  CL 98* 96*  CO2 26 25  GLUCOSE 167* 189*  BUN 36* 25*  CREATININE 1.30* 0.96  CALCIUM 9.1 9.3    Lipid Panel:     Component Value Date/Time   CHOL 168 03/17/2016 0453   TRIG 561* 03/17/2016 0453   HDL 26* 03/17/2016 0453   CHOLHDL 6.5 03/17/2016 0453   VLDL UNABLE TO CALCULATE IF TRIGLYCERIDE OVER 400 mg/dL 03/17/2016 0453   LDLCALC UNABLE TO CALCULATE IF TRIGLYCERIDE OVER 400 mg/dL 03/17/2016 0453   HgbA1c:  Lab Results  Component Value Date   HGBA1C 10.2* 03/17/2016   Urine Drug Screen:     Component Value Date/Time   LABOPIA NONE DETECTED 03/17/2016 0630   COCAINSCRNUR NONE DETECTED 03/17/2016 0630   LABBENZ NONE DETECTED 03/17/2016 0630   AMPHETMU NONE DETECTED 03/17/2016 0630   THCU NONE DETECTED 03/17/2016 0630   LABBARB NONE DETECTED 03/17/2016 0630      IMAGING I have personally reviewed the radiological images below and agree with the radiology  interpretations.  Ct Head Code Stroke Wo Contrast 03/16/2016  IMPRESSION: No acute finding or change compared to 2015.  Aspects is 10.   MRI and MRA  03/17/2016    1. No acute infarct. 2. Periventricular and subcortical white matter disease bilaterally is asymmetric on the left. 3. Asymmetric decreased flow signal within a tortuous left internal carotid artery and asymmetric flow signal in the left MCA branch vessels. This suggests a more proximal stenosis. Carotid bifurcation analysis is advised. 4. Mild distal small vessel disease without a significant proximal stenosis, aneurysm, or branch vessel occlusion within either the anterior or posterior circulation.   Dg Chest Port 1 View 03/17/2016    Cardiomegaly with mild pulmonary vascular congestion.   Ct Angio Head W Or Wo Contrast  03/18/2016   IMPRESSION: 1. Bulky and confluent calcification at the left carotid bifurcation and involving the proximal 3.5 cm of the left ICA with tandem RADIOGRAPHIC STRING SIGN stenosis. Despite this the left ICA remains patent. There is downstream mild to moderate cavernous segment stenosis related to extensive ICA siphon calcified plaque. 2. Lesser right carotid calcified plaque with mild to moderate right siphon anterior genu stenosis. 3. Moderate to severe bilateral ACA A1 and right A2 segment stenoses. 4.  Posterior circulation calcified plaque. High-grade (75%) proximal left subclavian artery stenosis. No significant left vertebral artery stenosis. Moderate or severe right vertebral artery origin and right V4 segment stenosis. 5.  Stable CT appearance of the brain.   2D echo  - Left ventricle: Poor acoustic windows limit study Definity used to optimize Overall LVEF appears normal at 55 to 60% The cavity size was normal. Wall thickness was increased in a pattern of moderate LVH. Doppler parameters are consistent with abnormal left ventricular relaxation (grade 1 diastolic dysfunction). - Left atrium: The atrium  was moderately dilated.   PHYSICAL EXAM General - Morbid obesity, well developed, no in acute distress.  Ophthalmologic - Fundi not visualized due to acute distress.  Cardiovascular - Regular rate and rhythm.  Mental Status -  Level of arousal and orientation to time, place, and person were intact. Language including expression, naming, repetition, comprehension was assessed and found intact Fund of Knowledge was assessed and was intact.  Cranial Nerves II - XII - II - Visual field intact OU. III, IV, VI - Extraocular movements intact. V - Facial sensation intact bilaterally. VII - Facial movement intact bilaterally. VIII - Hearing & vestibular intact bilaterally. X - Palate elevates symmetrically. XI - Chin turning & shoulder shrug intact bilaterally. XII - Tongue protrusion intact.  Motor Strength - The patient's strength was 4+/5 in all extremities and pronator drift was absent.  Bulk was normal and fasciculations were absent.   Motor Tone - Muscle tone was assessed at the neck and appendages and was normal.  Reflexes - The patient's reflexes were 1+ in all extremities and he had no pathological reflexes.  Sensory - Light touch, temperature/pinprick were assessed and were symmetrical.    Coordination - The patient had normal movements in the hands with no ataxia or dysmetria.  Tremor was absent.  Gait and Station - not tested due to acute distress.   ASSESSMENT/PLAN [name] is a 65 y.o. male with history of HTN, DM, CAD, HLD, obestiy, OSA, carotid stenosis, CHF and COPD who presented with generalized weakness and near syncope but developed aphasia and right-sided weakness in ambulance on way to [facility]. At [facility], he received IV t-PA 03/16/2016 at 2336. He was transferred to [facility].  L brain TIA with suspicious hypotension in the setting of know left carotid stenosis, s/p IV tPA  Resultant  Expressive aphasia and right hemiplegia, resolved  MRI  No acute stroke  MRA   Left ICA and MCA decreased flow, suggesting proximal ICA high grade stenosis   Carotid Doppler  pending   CTA head and neck left ICA long segment of left ICA string sign.    2D Echo EF 55-60%   LDL unable to calculate due to TG 561  HgbA1c 10.2  SCDs for VTE prophylaxis Diet Carb Modified Fluid consistency:: Thin; Room service appropriate?: Yes  aspirin 81 mg daily prior to admission (taken off  plavix 2 weeks ago per cardiology), resumed aspirin and plavix 24 h post tPA  Ongoing aggressive stroke risk factor management  Therapy recommendations:  HH PT  Disposition:  pending (lives with family PTA)  L ICA high grade stenosis  Known to pt  Carotid doppler pending   CTA head and neck showed left ICA long segment string sign   BP goal 130-150 due to ICA stenosis  Requested VVS consult for left ICA intervention, CEA vs. CAS  CHF   Recently discharged for CHF and fluid overload  Followed with Dr.  Branch on 03/03/16  On home meds with lasix 80mg  bid, metoprolol 25mg  bid, lisinopril 40mg  daily, amlodipine 2.5mg  and terazosin 4mg   EF 55-60% this admission  Due to concerns of BP dependent stroke/TIA event, will hold off lisinopril, amlodipine and terazosin, resume half dose of metoprolol but full dose of lasix  Cardiology consult requested  Hypertension  BP 198/78 on arrival  On metoprolol 25 bid, norvasc 2.5 qd, lisinopril 40 qd, terazosin 2 mg q hs and lasix 20 mg daily  resumed lasix 80 g daily  Resume half dose metoprolol 12.5 mg bid  Hold other meds  BP Stable   BP has been running low at home   Long-term BP goal 130-150 due to ICA stenosis  Cardiology consult to help with BP and CHF management, appreciate recs  Hyperlipidemia Hypertriglyceridemia  Home meds:  lipitor 80  LDL unable to calculate, goal < 70  Resume home statin  Added lopid for elevated triglycerides   Continue statin at discharge  Diabetes  HgbA1c 10.2, goal <  7.0  Uncontrolled  Resume home diabetes medications - high dose insulin - increase morning dose to 50U  DM education following  SSI  CBG monitoring  AKI on admission  Cre 2.14 on admission, now 0.96  Metformin resumed  D/c lisinopril  Encourage po intake  Continue lasix  Other Stroke Risk Factors  Advanced age  Former Cigarette smoker  Morbid obesity, Body mass index is 39.67 kg/(m^2)., recommend weight loss, diet and exercise as appropriate   Coronary artery disease - previous cath 07/2014 with LM patent, LAD ostial 40-50%, LCX mld disease, RCA occluded filling by collaterals. Unsuccesful attempt at RCA PCI.  - on DAPT since 07/2014 - plavix stopped  - Dr. Harl Bowie following  OSA on CPAP at home  Other Active Problems  COPD on home O2 at hs  Chronic back pain on Vicodin, has used x 20 years  Hyperkalemia, resolved - K 6.1->5.9->4.8->4.0   Hospital day # 3  Rosalin Hawking, MD PhD Stroke Neurology 03/19/2016 10:29 AM   To contact Stroke Continuity provider, please refer to [email or web address] After hours, contact General Neurology

## 2016-03-19 NOTE — Care Management Important Message (Signed)
Important Message  Patient Details  Name: Jose ZOMBEK MRN: [id number] Date of Birth: 27-May-1951   Medicare Important Message Given:  Yes    Loann Quill 03/19/2016, 8:23 AM

## 2016-03-19 NOTE — Progress Notes (Signed)
Speech Language Pathology Treatment: Cognitive-Linquistic  Patient Details Name: NICANDRO OUTCALT MRN: [id number] DOB: [birth year] Today's Date: 03/19/2016 Time: [id number] SLP Time Calculation (min) (ACUTE ONLY): 13 min  Assessment / Plan / Recommendation Clinical Impression  Pt in chair, alert and denies pain. Initial assessment pt was in significant pain and exhibited difficulty sustaining attention, required frequent repetition and thinking through problems. Per PT/OT's documentation, pt continues to show impulsivity. Wife and son state he appears at his baseline and usually "does what he wants on his timeframe."   SLP discussed possible implications of impulsivity and strategies to compensate. Recommend pt continue PT/OT, no further ST needs.   HPI HPI: 31-yo man with PMH: HTN, DM, COPD presented to [facility] ED with generalized weakness at home. Per chart pt is aphasic. Initial NIHSS score of 2 given tPA. then documented to have NIHSS score of 0. CT no acute finding or change compared to 2015.      SLP Plan  Other (Comment) (d/c, close to baseline)     Recommendations                Oral Care Recommendations: Oral care BID Follow up Recommendations: None Plan: Other (Comment) (d/c, close to baseline)     GO                Mick Sell, Orbie Pyo 03/19/2016, 12:06 PM  Orbie Pyo Colvin Caroli.Ed [employer] [phone]

## 2016-03-19 NOTE — Progress Notes (Signed)
PT Cancellation Note  Patient Details Name: Jose [name] MRN: [id number] DOB: 1950-10-26   Cancelled Treatment:    Reason Eval/Treat Not Completed: Patient declined, no reason specifiedPt declined mobility despite max encouragement and education on benefits of OOB mobility. Encouraged pt to ambulate with nursing staff later today if able. Wife present and provided encouragement. PT will continue to follow acutely.    Salina April, PTA Pager: [phone]   03/19/2016, 1:46 PM

## 2016-03-20 LAB — CBC
HCT: 44.4 % (ref 39.0–52.0)
Hemoglobin: 14.5 g/dL (ref 13.0–17.0)
MCH: 28.8 pg (ref 26.0–34.0)
MCHC: 32.7 g/dL (ref 30.0–36.0)
MCV: 88.3 fL (ref 78.0–100.0)
PLATELETS: 179 10*3/uL (ref 150–400)
RBC: 5.03 MIL/uL (ref 4.22–5.81)
RDW: 13.9 % (ref 11.5–15.5)
WBC: 6.9 10*3/uL (ref 4.0–10.5)

## 2016-03-20 LAB — BASIC METABOLIC PANEL
Anion gap: 12 (ref 5–15)
BUN: 25 mg/dL — AB (ref 6–20)
CALCIUM: 9 mg/dL (ref 8.9–10.3)
CO2: 26 mmol/L (ref 22–32)
CREATININE: 1.09 mg/dL (ref 0.61–1.24)
Chloride: 96 mmol/L — ABNORMAL LOW (ref 101–111)
GFR calc Af Amer: >60 mL/min (ref >60)
GFR calc non Af Amer: >60 mL/min (ref >60)
GLUCOSE: 212 mg/dL — AB (ref 65–99)
Potassium: 4.3 mmol/L (ref 3.5–5.1)
Sodium: 134 mmol/L — ABNORMAL LOW (ref 135–145)

## 2016-03-20 LAB — GLUCOSE, CAPILLARY
GLUCOSE-CAPILLARY: 226 mg/dL — AB (ref 65–99)
Glucose-Capillary: 288 mg/dL — ABNORMAL HIGH (ref 65–99)
Glucose-Capillary: 190 mg/dL — ABNORMAL HIGH (ref 65–99)

## 2016-03-20 MED ORDER — BISACODYL 5 MG PO TBEC
10.0000 mg | DELAYED_RELEASE_TABLET | Freq: Two times a day (BID) | ORAL | Status: DC | PRN
  Administered 2016-03-20 – 2016-03-21 (×3): 10 mg via ORAL
  Filled 2016-03-20 (×3): qty 2

## 2016-03-20 MED ORDER — POLYETHYLENE GLYCOL 3350 17 G PO PACK
17.0000 g | PACK | Freq: Every day | ORAL | Status: DC
  Administered 2016-03-20 – 2016-03-21 (×2): 17 g via ORAL
  Filled 2016-03-20 (×3): qty 1

## 2016-03-20 NOTE — Progress Notes (Signed)
Pt c/o constipation; MD notified and new orders received. Will continue to monitor pt closely. Delia Heady RN

## 2016-03-20 NOTE — Progress Notes (Signed)
STROKE TEAM PROGRESS NOTE  OBJECTIVE This is a 65-yo man who presented to [facility] ED via EMS after family found him with generalized weakness at home. The patient is aphasic and cannot provide any history. History is thus obtained from his wife and son who arrived in the ICU to visit. I have also reviewed his medical records and briefly discussed the case with Dr. Sabra Heck via telephone.   His wife reports that he was in his usual state of health when she left the house this morning and when she spoke to him on the phone this afternoon. His son arrived home this evening around 1800 and heard the patient calling to him from the bathroom. He went in to check on him and noted that the patient seemed pale and was having difficulty standing up. He uses a walker at baseline due to severe neuropathy and chronic back pain. His son had him sit down for a minute and then helped him walk back to his bedroom. The patient required support to ambulate and his son states that he looked like was going to pass out. His wife arrived home shortly after this and called EMS. His family describes generalized weakness without focality and also note that he was communicating normally. En route to the ED he apparently had the abrupt onset of aphasia and right-sided weakness. Per ED MD note, he was found to have expressive and receptive aphasia as well as a right-sided drift. Teleneurology was contacted and the patient was felt to be having an acute stroke with reported initial NIHSS score of 2. He was deemed to be a candidate for tPA; bolus was given at 2336 with infusion started at 2337. I was contacted and accepted the patient in transfer. During transport from OSH he was documented to have NIHSS score of 0.   Of note, his wife reports that he has significant vision problems related to his diabetes and has had laser treatment in one eye with plans for additional treatment at the [place]. He also reportedly has glaucoma for which he has  an upcoming appointment. She also states that he has chronic hearing loss.   He was recently admitted to [facility] from 02/24/16-02/27/16 for an acute diastolic CHF exacerbation. He was diuresed with IV furosemide and discharged on oral furosemide and fluid restriction to continue diuresing. He saw cardiology for follow-up on 03/03/16 and was noted to still be volume overloaded so his furosemide was continued unchanged. He was taken off of Plavix and maintained on aspirin monotherapy during this visit.   SUBJECTIVE (INTERVAL HISTORY) No active complains    OBJECTIVE Temp:  [97.6 F (36.4 C)-98.5 F (36.9 C)] 98.5 F (36.9 C) (07/22 0954) Pulse Rate:  [42-85] 85 (07/22 0954) Cardiac Rhythm:  [-] Normal sinus rhythm (07/22 0810) Resp:  [14-20] 18 (07/22 0954) BP: (127-159)/(44-81) 150/76 mmHg (07/22 0954) SpO2:  [92 %-99 %] 99 % (07/22 0954)  CBC:   Recent Labs Lab 03/16/16 2230  03/19/16 0450 03/20/16 0522  WBC 11.6*  < > 7.1 6.9  NEUTROABS 9.1*  --   --   --   HGB 15.6  < > 14.8 14.5  HCT 45.6  < > 45.1 44.4  MCV 88.4  < > 88.6 88.3  PLT 195  < > 173 179  < > = values in this interval not displayed.  Basic Metabolic Panel:   Recent Labs Lab 03/19/16 0450 03/20/16 0522  NA 133* 134*  K 4.0 4.3  CL  96* 96*  CO2 25 26  GLUCOSE 189* 212*  BUN 25* 25*  CREATININE 0.96 1.09  CALCIUM 9.3 9.0    Lipid Panel:     Component Value Date/Time   CHOL 168 03/17/2016 0453   TRIG 561* 03/17/2016 0453   HDL 26* 03/17/2016 0453   CHOLHDL 6.5 03/17/2016 0453   VLDL UNABLE TO CALCULATE IF TRIGLYCERIDE OVER 400 mg/dL 03/17/2016 0453   LDLCALC UNABLE TO CALCULATE IF TRIGLYCERIDE OVER 400 mg/dL 03/17/2016 0453   HgbA1c:  Lab Results  Component Value Date   HGBA1C 10.2* 03/17/2016   Urine Drug Screen:     Component Value Date/Time   LABOPIA NONE DETECTED 03/17/2016 0630   COCAINSCRNUR NONE DETECTED 03/17/2016 0630   LABBENZ NONE DETECTED 03/17/2016 0630   AMPHETMU NONE  DETECTED 03/17/2016 0630   THCU NONE DETECTED 03/17/2016 0630   LABBARB NONE DETECTED 03/17/2016 0630      IMAGING I have personally reviewed the radiological images below and agree with the radiology interpretations.  Ct Head Code Stroke Wo Contrast 03/16/2016  IMPRESSION:  No acute finding or change compared to 2015.  Aspects is 10.    MRI and MRA  03/17/2016    1. No acute infarct.  2. Periventricular and subcortical white matter disease bilaterally is asymmetric on the left.  3. Asymmetric decreased flow signal within a tortuous left internal carotid artery and asymmetric flow signal in the left MCA branch vessels. This suggests a more proximal stenosis. Carotid bifurcation analysis is advised.  4. Mild distal small vessel disease without a significant proximal stenosis, aneurysm, or branch vessel occlusion within either the anterior or posterior circulation.   Dg Chest Port 1 View 03/17/2016 Cardiomegaly with mild pulmonary vascular congestion.     Ct Angio Head W Or Wo Contrast 03/18/2016   IMPRESSION:  1. Bulky and confluent calcification at the left carotid bifurcation and involving the proximal 3.5 cm of the left ICA with tandem RADIOGRAPHIC STRING SIGN stenosis. Despite this the left ICA remains patent. There is downstream mild to moderate cavernous segment stenosis related to extensive ICA siphon calcified plaque.  2. Lesser right carotid calcified plaque with mild to moderate right siphon anterior genu stenosis.  3. Moderate to severe bilateral ACA A1 and right A2 segment stenoses.  4. Posterior circulation calcified plaque. High-grade (75%) proximal left subclavian artery stenosis. No significant left vertebral artery stenosis. Moderate or severe right vertebral artery origin and right V4 segment stenosis.  5.  Stable CT appearance of the brain.    2D echo  - Left ventricle: Poor acoustic windows limit study Definity used to optimize Overall LVEF appears normal at 55 to  60% The cavity size was normal. Wall thickness was increased in a pattern of moderate LVH. Doppler parameters are consistent with abnormal left ventricular relaxation (grade 1 diastolic dysfunction). - Left atrium: The atrium was moderately dilated.  Carotid Doppler 03/18/2016 Study was technically difficult due to patient body habitus and depth of vessels. Findings suggest 1-39% right internal carotid artery stenosis. The left internal carotid artery appears to be chronically occluded; unable to adequately visualize. The left external carotid artery exhibits >50% stenosis. Vertebral arteries are patent with antegrade flow. Other specific details can be found in the table(s) above. Prepared and Electronically Authenticated by   PHYSICAL EXAM General - Morbid obesity, well developed, no in acute distress.  Ophthalmologic - Fundi not visualized due to acute distress.  Cardiovascular - Regular rate and rhythm.  Mental Status -  Level of arousal and orientation to time, place, and person were intact. Language including expression, naming, repetition, comprehension was assessed and found intact Fund of Knowledge was assessed and was intact.  Cranial Nerves II - XII - II - Visual field intact OU. III, IV, VI - Extraocular movements intact. V - Facial sensation intact bilaterally. VII - Facial movement intact bilaterally. VIII - Hearing & vestibular intact bilaterally. X - Palate elevates symmetrically. XI - Chin turning & shoulder shrug intact bilaterally. XII - Tongue protrusion intact.  Motor Strength - The patient's strength was 4+/5 in all extremities and pronator drift was absent.  Bulk was normal and fasciculations were absent.   Motor Tone - Muscle tone was assessed at the neck and appendages and was normal.  Reflexes - The patient's reflexes were 1+ in all extremities and he had no pathological reflexes.  Sensory - Light touch, temperature/pinprick were assessed and were  symmetrical.    Coordination - The patient had normal movements in the hands with no ataxia or dysmetria.  Tremor was absent.  Gait and Station - not tested due to acute distress.   ASSESSMENT/PLAN [name] is a 65 y.o. male with history of HTN, DM, CAD, HLD, obestiy, OSA, carotid stenosis, CHF and COPD who presented with generalized weakness and near syncope but developed aphasia and right-sided weakness in ambulance on way to [facility]. At [facility], he received IV t-PA 03/16/2016 at 2336. He was transferred to [facility].  L brain TIA with suspicious hypotension in the setting of know left carotid stenosis, s/p IV tPA  Resultant  Expressive aphasia and right hemiplegia, resolved  MRI  No acute stroke  MRA  Left ICA and MCA decreased flow, suggesting proximal ICA high grade stenosis   CTA head and neck left ICA long segment of left ICA string sign.    2D Echo EF 55-60%   LDL unable to calculate due to TG 561  HgbA1c 10.2  SCDs for VTE prophylaxis Diet Carb Modified Fluid consistency:: Thin; Room service appropriate?: Yes  aspirin 81 mg daily prior to admission (taken off  plavix 2 weeks ago per cardiology), resumed aspirin and plavix 24 h post tPA  Ongoing aggressive stroke risk factor management  Therapy recommendations:  HH PT  Disposition:  pending (lives with family PTA)  L ICA high grade stenosis  1-39% right internal carotid artery stenosis. The  left internal carotid artery appears to be chronically occluded;  unable to adequately visualize. The left external carotid artery  exhibits >50% stenosis. Vertebral arteries are patent with  antegrade flow.  CTA head and neck showed left ICA long segment string sign   BP goal 130-150 due to ICA stenosis  Requested VVS consult for left ICA intervention, CEA vs. CAS  CHF   Recently discharged for CHF and fluid overload  Followed with Dr. Harl Bowie on 03/03/16  On home meds with lasix 80mg  bid, metoprolol 25mg  bid, lisinopril  40mg  daily, amlodipine 2.5mg  and terazosin 4mg   EF 55-60% this admission  Due to concerns of BP dependent stroke/TIA event, will hold off lisinopril, amlodipine and terazosin, resume half dose of metoprolol but full dose of lasix  Cardiology consult requested  Hypertension  BP 198/78 on arrival  On metoprolol 25 bid, norvasc 2.5 qd, lisinopril 40 qd, terazosin 2 mg q hs and lasix 20 mg daily  resumed lasix 80 g daily  Resume half dose metoprolol 12.5 mg bid  Hold other meds  BP Stable   BP  has been running low at home   Long-term BP goal 130-150 due to ICA stenosis  Cardiology consult to help with BP and CHF management, appreciate recs  Hyperlipidemia Hypertriglyceridemia  Home meds:  lipitor 80  LDL unable to calculate, goal < 70  Resume home statin  Added lopid for elevated triglycerides   Continue statin at discharge  Diabetes  HgbA1c 10.2, goal < 7.0  Uncontrolled  Resume home diabetes medications - high dose insulin - increase morning dose to 50U  DM education following  SSI  CBG monitoring  AKI on admission  Cre 2.14 on admission, now 0.96  Metformin resumed  D/c lisinopril  Encourage po intake  Continue lasix  Other Stroke Risk Factors  Advanced age  Former Cigarette smoker  Morbid obesity, Body mass index is 39.67 kg/(m^2)., recommend weight loss, diet and exercise as appropriate   Coronary artery disease - previous cath 07/2014 with LM patent, LAD ostial 40-50%, LCX mld disease, RCA occluded filling by collaterals. Unsuccesful attempt at RCA PCI.  - on DAPT since 07/2014 - plavix stopped  - Dr. Harl Bowie following  OSA on CPAP at home  Other Active Problems  COPD on home O2 at hs  Chronic back pain on Vicodin, has used x 20 years  Hyperkalemia, resolved - K 6.1->5.9->4.8->4.0 -> 4.3    PLAN  Monday cerebral angio by vascular surgery. CEA vs. CAS.  Check Bmet Monday    Z. Robyne Askew MD  Neurology 03/20/2016 11:43  AM   To contact Stroke Continuity provider, please refer to [email or web address] After hours, contact General Neurology

## 2016-03-20 NOTE — Progress Notes (Signed)
Occupational Therapy Treatment Patient Details Name: Jose [name] MRN: [id number] DOB: [birth year] Today's Date: 03/20/2016    History of present illness 65 y.o. male admitted to [facility] on 03/16/16 for weakness and difficulty walking.   In the ambulance en route to the hospital he also started having difficulty speaking with R sided weakness. Dx with presumed L brain stroke s/p tPA. Stroke workup pending.  Pt with significant PMHx of HTN, COPD, DM, neuropathy, CAD, morbid obesity, low back surgery.     OT comments  Pt is demonstrating improvements with balance and activity tolerance.  He currently is able to perform ADLs with supervision to min guard assist.  He does move quickly, but anticipate this is his baseline.  Will continue to follow   Follow Up Recommendations  Home health OT;Supervision/Assistance - 24 hour    Equipment Recommendations  None recommended by OT    Recommendations for Other Services      Precautions / Restrictions Precautions Precautions: Fall Precaution Comments: pt, at baseline uses RW.  Remote h/o falls related to back pain.        Mobility Bed Mobility                  Transfers Overall transfer level: Needs assistance Equipment used: None Transfers: Sit to/from Stand;Stand Pivot Transfers Sit to Stand: Supervision Stand pivot transfers: Supervision            Balance Overall balance assessment: Needs assistance Sitting-balance support: Feet supported Sitting balance-Leahy Scale: Good     Standing balance support: During functional activity;No upper extremity supported Standing balance-Leahy Scale: Fair                     ADL Overall ADL's : Needs assistance/impaired                     Lower Body Dressing: Supervision/safety;Sit to/from stand   Toilet Transfer: Supervision/safety;Ambulation;Comfort height toilet;Grab bars;RW           Functional mobility during ADLs: Supervision/safety;Rolling  walker General ADL Comments: Pt reports he feels he is back to ~99% of his norm       Vision                 Additional Comments: Pt reports vision at baseline    Perception     Praxis      Cognition   Behavior During Therapy: Impulsive Overall Cognitive Status: Within Functional Limits for tasks assessed (pt appears at baseline )                       Extremity/Trunk Assessment               Exercises     Shoulder Instructions       General Comments      Pertinent Vitals/ Pain       Pain Assessment: Faces Faces Pain Scale: Hurts even more Pain Location: LOW BACK  Pain Descriptors / Indicators: Aching Pain Intervention(s): Repositioned;Monitored during session  Home Living                                          Prior Functioning/Environment              Frequency Min 2X/week     Progress Toward Goals  OT Goals(current goals can now be  found in the care plan section)  Progress towards OT goals: Progressing toward goals  ADL Goals Pt Will Perform Grooming: with modified independence;standing Pt Will Perform Upper Body Bathing: with modified independence;sitting Pt Will Perform Lower Body Bathing: with modified independence;sit to/from stand Pt Will Transfer to Toilet: with modified independence;ambulating;regular height toilet Pt Will Perform Toileting - Clothing Manipulation and hygiene: with modified independence;sit to/from stand Pt Will Perform Tub/Shower Transfer: Shower transfer;with supervision;ambulating;shower seat;rolling walker  Plan Discharge plan remains appropriate    Co-evaluation                 End of Session Equipment Utilized During Treatment: Rolling walker   Activity Tolerance Patient tolerated treatment well   Patient Left in chair;with call bell/phone within reach;with chair alarm set;with family/visitor present   Nurse Communication Mobility status        Time:  [id number] OT Time Calculation (min): 24 min  Charges:    Lucille Passy M 03/20/2016, 5:12 PM

## 2016-03-20 NOTE — Progress Notes (Signed)
Note recommendations by Pierre Bali. BP has remained stable   Will f/u on Monday Call with questions.

## 2016-03-20 NOTE — Progress Notes (Signed)
PHARMACIST - PHYSICIAN COMMUNICATION  CONCERNING:  Gemfibrozil   RECOMMENDATION: Please consider changing gemfibrozil to fenofibrate to decrease risk of rhabdo with statin therapy.   Thank you Anette Guarneri, PharmD [phone]

## 2016-03-20 NOTE — Progress Notes (Signed)
Patient placed himself on CPAP for the nigth. Tolerating well. Will call if anything needed

## 2016-03-21 LAB — GLUCOSE, CAPILLARY
GLUCOSE-CAPILLARY: 194 mg/dL — AB (ref 65–99)
Glucose-Capillary: 372 mg/dL — ABNORMAL HIGH (ref 65–99)
Glucose-Capillary: 195 mg/dL — ABNORMAL HIGH (ref 65–99)
Glucose-Capillary: 176 mg/dL — ABNORMAL HIGH (ref 65–99)

## 2016-03-21 LAB — BASIC METABOLIC PANEL
Anion gap: 13 (ref 5–15)
BUN: 28 mg/dL — AB (ref 6–20)
CHLORIDE: 95 mmol/L — AB (ref 101–111)
CO2: 25 mmol/L (ref 22–32)
Calcium: 9.1 mg/dL (ref 8.9–10.3)
Creatinine, Ser: 1.15 mg/dL (ref 0.61–1.24)
GFR calc Af Amer: >60 mL/min (ref >60)
GFR calc non Af Amer: >60 mL/min (ref >60)
GLUCOSE: 133 mg/dL — AB (ref 65–99)
POTASSIUM: 4 mmol/L (ref 3.5–5.1)
Sodium: 133 mmol/L — ABNORMAL LOW (ref 135–145)

## 2016-03-21 LAB — CBC
HEMATOCRIT: 46.3 % (ref 39.0–52.0)
HEMOGLOBIN: 15.2 g/dL (ref 13.0–17.0)
MCH: 29 pg (ref 26.0–34.0)
MCHC: 32.8 g/dL (ref 30.0–36.0)
MCV: 88.4 fL (ref 78.0–100.0)
Platelets: 188 10*3/uL (ref 150–400)
RBC: 5.24 MIL/uL (ref 4.22–5.81)
RDW: 13.7 % (ref 11.5–15.5)
WBC: 8.1 10*3/uL (ref 4.0–10.5)

## 2016-03-21 NOTE — Progress Notes (Signed)
STROKE TEAM PROGRESS NOTE  OBJECTIVE This is a 65-yo man who presented to [facility] ED via EMS after family found him with generalized weakness at home. The patient is aphasic and cannot provide any history. History is thus obtained from his wife and son who arrived in the ICU to visit. I have also reviewed his medical records and briefly discussed the case with Dr. Sabra Heck via telephone.   His wife reports that he was in his usual state of health when she left the house this morning and when she spoke to him on the phone this afternoon. His son arrived home this evening around 1800 and heard the patient calling to him from the bathroom. He went in to check on him and noted that the patient seemed pale and was having difficulty standing up. He uses a walker at baseline due to severe neuropathy and chronic back pain. His son had him sit down for a minute and then helped him walk back to his bedroom. The patient required support to ambulate and his son states that he looked like was going to pass out. His wife arrived home shortly after this and called EMS. His family describes generalized weakness without focality and also note that he was communicating normally. En route to the ED he apparently had the abrupt onset of aphasia and right-sided weakness. Per ED MD note, he was found to have expressive and receptive aphasia as well as a right-sided drift. Teleneurology was contacted and the patient was felt to be having an acute stroke with reported initial NIHSS score of 2. He was deemed to be a candidate for tPA; bolus was given at 2336 with infusion started at 2337. I was contacted and accepted the patient in transfer. During transport from OSH he was documented to have NIHSS score of 0.   Of note, his wife reports that he has significant vision problems related to his diabetes and has had laser treatment in one eye with plans for additional treatment at the [place]. He also reportedly has glaucoma for which he has  an upcoming appointment. She also states that he has chronic hearing loss.   He was recently admitted to [facility] from 02/24/16-02/27/16 for an acute diastolic CHF exacerbation. He was diuresed with IV furosemide and discharged on oral furosemide and fluid restriction to continue diuresing. He saw cardiology for follow-up on 03/03/16 and was noted to still be volume overloaded so his furosemide was continued unchanged. He was taken off of Plavix and maintained on aspirin monotherapy during this visit.   SUBJECTIVE (INTERVAL HISTORY) No active complains    OBJECTIVE Temp:  [97.5 F (36.4 C)-98.5 F (36.9 C)] 98 F (36.7 C) (07/23 0556) Pulse Rate:  [71-97] 71 (07/23 0556) Cardiac Rhythm: Normal sinus rhythm (07/22 1900) Resp:  [16-20] 17 (07/23 0556) BP: (127-153)/(49-76) 151/51 (07/23 0556) SpO2:  [92 %-99 %] 95 % (07/23 0556)  CBC:   Recent Labs Lab 03/16/16 2230  03/20/16 0522 03/21/16 0354  WBC 11.6*  < > 6.9 8.1  NEUTROABS 9.1*  --   --   --   HGB 15.6  < > 14.5 15.2  HCT 45.6  < > 44.4 46.3  MCV 88.4  < > 88.3 88.4  PLT 195  < > 179 188  < > = values in this interval not displayed.  Basic Metabolic Panel:   Recent Labs Lab 03/20/16 0522 03/21/16 0354  NA 134* 133*  K 4.3 4.0  CL 96* 95*  CO2 26 25  GLUCOSE 212* 133*  BUN 25* 28*  CREATININE 1.09 1.15  CALCIUM 9.0 9.1    Lipid Panel:     Component Value Date/Time   CHOL 168 03/17/2016 0453   TRIG 561 (H) 03/17/2016 0453   HDL 26 (L) 03/17/2016 0453   CHOLHDL 6.5 03/17/2016 0453   VLDL UNABLE TO CALCULATE IF TRIGLYCERIDE OVER 400 mg/dL 03/17/2016 0453   LDLCALC UNABLE TO CALCULATE IF TRIGLYCERIDE OVER 400 mg/dL 03/17/2016 0453   HgbA1c:  Lab Results  Component Value Date   HGBA1C 10.2 (H) 03/17/2016   Urine Drug Screen:     Component Value Date/Time   LABOPIA NONE DETECTED 03/17/2016 0630   COCAINSCRNUR NONE DETECTED 03/17/2016 0630   LABBENZ NONE DETECTED 03/17/2016 0630   AMPHETMU NONE  DETECTED 03/17/2016 0630   THCU NONE DETECTED 03/17/2016 0630   LABBARB NONE DETECTED 03/17/2016 0630      IMAGING I have personally reviewed the radiological images below and agree with the radiology interpretations.  Ct Head Code Stroke Wo Contrast 03/16/2016  IMPRESSION:  No acute finding or change compared to 2015.  Aspects is 10.    MRI and MRA  03/17/2016    1. No acute infarct.  2. Periventricular and subcortical white matter disease bilaterally is asymmetric on the left.  3. Asymmetric decreased flow signal within a tortuous left internal carotid artery and asymmetric flow signal in the left MCA branch vessels. This suggests a more proximal stenosis. Carotid bifurcation analysis is advised.  4. Mild distal small vessel disease without a significant proximal stenosis, aneurysm, or branch vessel occlusion within either the anterior or posterior circulation.   Dg Chest Port 1 View 03/17/2016 Cardiomegaly with mild pulmonary vascular congestion.     Ct Angio Head W Or Wo Contrast 03/18/2016   IMPRESSION:  1. Bulky and confluent calcification at the left carotid bifurcation and involving the proximal 3.5 cm of the left ICA with tandem RADIOGRAPHIC STRING SIGN stenosis. Despite this the left ICA remains patent. There is downstream mild to moderate cavernous segment stenosis related to extensive ICA siphon calcified plaque.  2. Lesser right carotid calcified plaque with mild to moderate right siphon anterior genu stenosis.  3. Moderate to severe bilateral ACA A1 and right A2 segment stenoses.  4. Posterior circulation calcified plaque. High-grade (75%) proximal left subclavian artery stenosis. No significant left vertebral artery stenosis. Moderate or severe right vertebral artery origin and right V4 segment stenosis.  5.  Stable CT appearance of the brain.    2D echo  - Left ventricle: Poor acoustic windows limit study Definity used to optimize Overall LVEF appears normal at 55 to  60% The cavity size was normal. Wall thickness was increased in a pattern of moderate LVH. Doppler parameters are consistent with abnormal left ventricular relaxation (grade 1 diastolic dysfunction). - Left atrium: The atrium was moderately dilated.  Carotid Doppler 03/18/2016 Study was technically difficult due to patient body habitus and depth of vessels. Findings suggest 1-39% right internal carotid artery stenosis. The left internal carotid artery appears to be chronically occluded; unable to adequately visualize. The left external carotid artery exhibits >50% stenosis. Vertebral arteries are patent with antegrade flow. Other specific details can be found in the table(s) above. Prepared and Electronically Authenticated by   PHYSICAL EXAM General - Morbid obesity, well developed, no in acute distress.  Ophthalmologic - Fundi not visualized due to acute distress.  Cardiovascular - Regular rate and rhythm.  Mental Status -  Level of arousal and orientation to time, place, and person were intact. Language including expression, naming, repetition, comprehension was assessed and found intact Fund of Knowledge was assessed and was intact.  Cranial Nerves II - XII - II - Visual field intact OU. III, IV, VI - Extraocular movements intact. V - Facial sensation intact bilaterally. VII - Facial movement intact bilaterally. VIII - Hearing & vestibular intact bilaterally. X - Palate elevates symmetrically. XI - Chin turning & shoulder shrug intact bilaterally. XII - Tongue protrusion intact.  Motor Strength - The patient's strength was 4+/5 in all extremities and pronator drift was absent.  Bulk was normal and fasciculations were absent.   Motor Tone - Muscle tone was assessed at the neck and appendages and was normal.  Reflexes - The patient's reflexes were 1+ in all extremities and he had no pathological reflexes.  Sensory - Light touch, temperature/pinprick were assessed and were  symmetrical.    Coordination - The patient had normal movements in the hands with no ataxia or dysmetria.  Tremor was absent.     ASSESSMENT/PLAN [name] is a 65 y.o. male with history of HTN, DM, CAD, HLD, obestiy, OSA, carotid stenosis, CHF and COPD who presented with generalized weakness and near syncope but developed aphasia and right-sided weakness in ambulance on way to [facility]. At [facility], he received IV t-PA 03/16/2016 at 2336. He was transferred to [facility].  L brain TIA with suspicious hypotension in the setting of know left carotid stenosis, s/p IV tPA  Resultant  Expressive aphasia and right hemiplegia, resolved  MRI  No acute stroke  MRA  Left ICA and MCA decreased flow, suggesting proximal ICA high grade stenosis   CTA head and neck left ICA long segment of left ICA string sign.    2D Echo EF 55-60%   LDL unable to calculate due to TG 561  HgbA1c 10.2  SCDs for VTE prophylaxis Diet Carb Modified Fluid consistency:: Thin; Room service appropriate?: Yes  aspirin 81 mg daily prior to admission (taken off  plavix 2 weeks ago per cardiology), resumed aspirin and plavix 24 h post tPA  Ongoing aggressive stroke risk factor management  Therapy recommendations:  HH PT  Disposition:  pending (lives with family PTA)  L ICA high grade stenosis  1-39% right internal carotid artery stenosis. The  left internal carotid artery appears to be chronically occluded;  unable to adequately visualize. The left external carotid artery  exhibits >50% stenosis. Vertebral arteries are patent with  antegrade flow.  CTA head and neck showed left ICA long segment string sign   BP goal 130-150 due to ICA stenosis  Requested VVS consult for left ICA intervention, CEA vs. CAS  CHF   Recently discharged for CHF and fluid overload  Followed with Dr. Harl Bowie on 03/03/16  On home meds with lasix 80mg  bid, metoprolol 25mg  bid, lisinopril 40mg  daily, amlodipine 2.5mg  and terazosin 4mg   EF  55-60% this admission  Due to concerns of BP dependent stroke/TIA event, will hold off lisinopril, amlodipine and terazosin, resume half dose of metoprolol but full dose of lasix  Cardiology consult requested  Hypertension  BP 198/78 on arrival  On metoprolol 25 bid, norvasc 2.5 qd, lisinopril 40 qd, terazosin 2 mg q hs and lasix 20 mg daily  resumed lasix 80 g daily  Resume half dose metoprolol 12.5 mg bid  Hold other meds  BP Stable   BP has been running low at home   Long-term  BP goal 130-150 due to ICA stenosis  Cardiology consult to help with BP and CHF management, appreciate recs  Hyperlipidemia Hypertriglyceridemia  Home meds:  lipitor 80  LDL unable to calculate, goal < 70  Resume home statin  Added lopid for elevated triglycerides   Continue statin at discharge  Diabetes  HgbA1c 10.2, goal < 7.0  Uncontrolled  Resume home diabetes medications - high dose insulin - increase morning dose to 50U  DM education following  SSI  CBG monitoring  AKI on admission  Cre 2.14 on admission, now 0.96  Metformin resumed  D/c lisinopril  Encourage po intake  Continue lasix  Other Stroke Risk Factors  Advanced age  Former Cigarette smoker  Morbid obesity, Body mass index is 39.68 kg/m., recommend weight loss, diet and exercise as appropriate   Coronary artery disease - previous cath 07/2014 with LM patent, LAD ostial 40-50%, LCX mld disease, RCA occluded filling by collaterals. Unsuccesful attempt at RCA PCI.  - on DAPT since 07/2014 - plavix stopped  - Dr. Harl Bowie following  OSA on CPAP at home  Other Active Problems  COPD on home O2 at hs  Chronic back pain on Vicodin, has used x 20 years  Hyperkalemia, resolved - K 6.1->5.9->4.8->4.0 -> 4.3    PLAN  Monday cerebral angio by vascular surgery. CEA vs. CAS.  Check Bmet Monday    Z. Robyne Askew MD  Neurology 03/21/2016 8:11 AM   To contact Stroke Continuity provider, please refer  to [email or web address] After hours, contact General Neurology

## 2016-03-21 NOTE — Progress Notes (Signed)
    Subjective  -   No interval change No neuro symptoms   Physical Exam:  Neuro intact     Assessment/Plan:    Plan carotid and cerebral angio tomorrow.  Will determine if Left Carotid is patent, as there is a discrepency between CTA and u/s.  Will also evaluate if patient is a candidate for carotid stenting.  I do not think he is a candidate for CEA, given that the disease extends beyond what is accessible surgically  Jose [name], [place] 03/21/2016 11:17 AM --  Vitals:   03/21/16 0556 03/21/16 1032  BP: (!) 151/51 (!) 154/66  Pulse: 71 83  Resp: 17 17  Temp: 98 F (36.7 C) 98.5 F (36.9 C)    Intake/Output Summary (Last 24 hours) at 03/21/16 1117 Last data filed at 03/21/16 0647  Gross per 24 hour  Intake              240 ml  Output                0 ml  Net              240 ml     Laboratory CBC    Component Value Date/Time   WBC 8.1 03/21/2016 0354   HGB 15.2 03/21/2016 0354   HCT 46.3 03/21/2016 0354   PLT 188 03/21/2016 0354    BMET    Component Value Date/Time   NA 133 (L) 03/21/2016 0354   K 4.0 03/21/2016 0354   CL 95 (L) 03/21/2016 0354   CO2 25 03/21/2016 0354   GLUCOSE 133 (H) 03/21/2016 0354   BUN 28 (H) 03/21/2016 0354   CREATININE 1.15 03/21/2016 0354   CALCIUM 9.1 03/21/2016 0354   GFRNONAA >60 03/21/2016 0354   GFRAA >60 03/21/2016 0354    COAG Lab Results  Component Value Date   INR 1.01 03/16/2016   INR 1.03 08/28/2014   No results found for: PTT  Antibiotics Anti-infectives    None       [name], M.D. [facility] Office: [phone] Pager:  [phone]

## 2016-03-22 ENCOUNTER — Encounter (HOSPITAL_COMMUNITY)
Admission: EM | Disposition: A | Discharge status: Home / Self Care | Payer: Self-pay | Source: Home / Self Care | Attending: Neurology

## 2016-03-22 ENCOUNTER — Encounter (HOSPITAL_COMMUNITY): Payer: Self-pay

## 2016-03-22 DIAGNOSIS — I5032 Chronic diastolic (congestive) heart failure: Secondary | ICD-10-CM

## 2016-03-22 HISTORY — PX: PERIPHERAL VASCULAR CATHETERIZATION: SHX172C

## 2016-03-22 LAB — BASIC METABOLIC PANEL
ANION GAP: 12 (ref 5–15)
BUN: 30 mg/dL — ABNORMAL HIGH (ref 6–20)
CALCIUM: 9 mg/dL (ref 8.9–10.3)
CO2: 28 mmol/L (ref 22–32)
Chloride: 92 mmol/L — ABNORMAL LOW (ref 101–111)
Creatinine, Ser: 1.21 mg/dL (ref 0.61–1.24)
Glucose, Bld: 203 mg/dL — ABNORMAL HIGH (ref 65–99)
Potassium: 4 mmol/L (ref 3.5–5.1)
Sodium: 132 mmol/L — ABNORMAL LOW (ref 135–145)

## 2016-03-22 LAB — CBC
HCT: 44.8 % (ref 39.0–52.0)
HEMOGLOBIN: 14.8 g/dL (ref 13.0–17.0)
MCH: 29.1 pg (ref 26.0–34.0)
MCHC: 33 g/dL (ref 30.0–36.0)
MCV: 88 fL (ref 78.0–100.0)
PLATELETS: 176 10*3/uL (ref 150–400)
RBC: 5.09 MIL/uL (ref 4.22–5.81)
RDW: 13.3 % (ref 11.5–15.5)
WBC: 8.2 10*3/uL (ref 4.0–10.5)

## 2016-03-22 LAB — GLUCOSE, CAPILLARY
GLUCOSE-CAPILLARY: 162 mg/dL — AB (ref 65–99)
GLUCOSE-CAPILLARY: 262 mg/dL — AB (ref 65–99)
Glucose-Capillary: 295 mg/dL — ABNORMAL HIGH (ref 65–99)
Glucose-Capillary: 210 mg/dL — ABNORMAL HIGH (ref 65–99)
Glucose-Capillary: 288 mg/dL — ABNORMAL HIGH (ref 65–99)

## 2016-03-22 LAB — PROTIME-INR
INR: 0.93 (ref 0.00–1.49)
PROTHROMBIN TIME: 12.7 s (ref 11.6–15.2)

## 2016-03-22 SURGERY — CAROTID ANGIOGRAPHY
Anesthesia: LOCAL | Laterality: Bilateral

## 2016-03-22 MED ORDER — ACETAMINOPHEN 325 MG RE SUPP: 325.0000 mg | RECTAL | Status: DC | PRN

## 2016-03-22 MED ORDER — LIDOCAINE HCL (PF) 1 % IJ SOLN
INTRAMUSCULAR | Status: DC | PRN
  Administered 2016-03-22: 15 mL

## 2016-03-22 MED ORDER — IODIXANOL 320 MG/ML IV SOLN
INTRAVENOUS | Status: DC | PRN
  Administered 2016-03-22: 150 mL via INTRAVENOUS

## 2016-03-22 MED ORDER — HEPARIN (PORCINE) IN NACL 2-0.9 UNIT/ML-% IJ SOLN
INTRAMUSCULAR | Status: AC
  Filled 2016-03-22: qty 1000

## 2016-03-22 MED ORDER — HYDRALAZINE HCL 20 MG/ML IJ SOLN: 5.0000 mg | INTRAMUSCULAR | Status: DC | PRN

## 2016-03-22 MED ORDER — ACETAMINOPHEN 325 MG PO TABS: 325.0000 mg | ORAL_TABLET | ORAL | Status: DC | PRN

## 2016-03-22 MED ORDER — ONDANSETRON HCL 4 MG/2ML IJ SOLN
4.0000 mg | Freq: Four times a day (QID) | INTRAMUSCULAR | Status: DC | PRN
Start: 2016-03-22 — End: 2016-03-22

## 2016-03-22 MED ORDER — LABETALOL HCL 5 MG/ML IV SOLN
10.0000 mg | INTRAVENOUS | Status: DC | PRN
  Administered 2016-03-22: 10 mg via INTRAVENOUS
  Filled 2016-03-22: qty 4

## 2016-03-22 MED ORDER — LIDOCAINE HCL (PF) 1 % IJ SOLN
INTRAMUSCULAR | Status: AC
  Filled 2016-03-22: qty 30

## 2016-03-22 MED ORDER — METOPROLOL TARTRATE 5 MG/5ML IV SOLN: 2.0000 mg | INTRAVENOUS | Status: DC | PRN

## 2016-03-22 MED ORDER — SODIUM CHLORIDE 0.45 % IV SOLN
INTRAVENOUS | Status: DC
  Administered 2016-03-22: 100 mL/h via INTRAVENOUS

## 2016-03-22 MED ORDER — HEPARIN (PORCINE) IN NACL 2-0.9 UNIT/ML-% IJ SOLN
INTRAMUSCULAR | Status: DC | PRN
  Administered 2016-03-22: 1000 mL

## 2016-03-22 SURGICAL SUPPLY — 9 items
CATH ANGIO 5F BER2 100CM (CATHETERS) ×1 IMPLANT
CATH ANGIO 5F PIGTAIL 100CM (CATHETERS) ×1 IMPLANT
HOVERMATT SINGLE USE (MISCELLANEOUS) ×1 IMPLANT
KIT PV (KITS) ×2 IMPLANT
SHEATH PINNACLE 5F 10CM (SHEATH) ×1 IMPLANT
SYR MEDRAD MARK V 150ML (SYRINGE) ×2 IMPLANT
TRANSDUCER W/STOPCOCK (MISCELLANEOUS) ×2 IMPLANT
TRAY PV CATH (CUSTOM PROCEDURE TRAY) ×2 IMPLANT
WIRE HITORQ VERSACORE ST 145CM (WIRE) ×1 IMPLANT

## 2016-03-22 NOTE — Care Management Important Message (Signed)
Important Message  Patient Details  Name: Jose [name] MRN: [id number] Date of Birth: [birth year]   Medicare Important Message Given:  Yes    Loann Quill 03/22/2016, 9:03 AM

## 2016-03-22 NOTE — Progress Notes (Signed)
Consent signed and in chart.   Deveron Shamoon, RN 

## 2016-03-22 NOTE — Op Note (Signed)
Procedure: Arch aortogram with bilateral selective carotid angiogram  Preoperative diagnosis: Symptomatic left internal carotid artery stenosis  Postoperative diagnosis: Same  Anesthesia: Local  Operative findings: #1 normal arch anatomy                                 #2 greater than 90% left internal carotid artery stenosis severe calcification extending several centimeters into the internal carotid artery                                 #3 no significant right internal carotid artery stenosis                                 #4  50% left subclavian artery stenosis exophytic calcified plaque                                #5 bilateral 70% vertebral artery stenosis left vertebral severely calcified  Operative details: After obtaining informed consent, the patient was taken to the [facility] lab. The patient was placed in supine position the Angio table. Both groins were prepped and draped in usual sterile fashion. Local anesthesia was entered of the right common femoral artery. Ultrasound was used to identify the right common femoral artery. An introducer needle was then used to cannulate the right common femoral artery after infiltration of local anesthesia. This was also done under ultrasound guidance. An 67 versacore wire was then threaded up into the right femoral system. A 5 French sheath was placed over this and the right common femoral artery. This was thoroughly flushed with heparinized saline. A 5 French pigtail catheter was then placed over the versacore wire and these were advanced as a unit up into the aortic arch and ascending aorta. The guidewire was removed and a contrast angiogram was performed of the arch in a 60 LAO projection.  The patient has normal arch anatomy. The innominate is widely patent. The origin of the right common and left common carotid arteries are widely patent. The origins of the right and left subclavian arteries are widely patent. There is a proximal left subclavian  artery stenosis about 50% with an exophytic calcified plaque. Bilateral vertebral arteries are about 70% stenosis. The left vertebral artery is severely calcified.  Next the pectoral catheter was pulled back over a guidewire. This was exchanged for a 5 [place] Berenstein 2 catheter. This was used to selectively catheterize the left common carotid artery. Left common carotid artery injection was then performed in an oblique lateral and AP projection with intracranial views to be interpreted by the neuroradiologist. The patient has a high-grade greater than 90% stenosis of the left internal carotid as well as the external carotid artery. This is severely calcified. There is then a tandem stenosis just beyond this was hazy haziness and calcification extending all the way up past the level of the mandible extending to about the C2 vertebral body level.  Next the [facility] 2 catheter was pulled back down the aortic arch and then the innominate followed by the right common carotid artery was selectively catheterized. Right common carotid artery injections were then performed in AP and lateral projection with intracranial views to be interpreted by the neuroradiologist. On the right side the  right external and internal carotid arteries are widely patent. His point the Berenstein catheter was pulled back over a guidewire. The 5 French sheath was thoroughly flushed with heparin saline. An oblique view of the groin puncture was performed which showed that the groin puncture was over the level of the femoral head in the common femoral artery with a normal femoral bifurcation and patent superficial femoral and profunda femoris arteries. The patient tied the procedure well aorta complications. The patient today in the holding area in stable condition.  Operative management: The films will be reviewed by Dr. Trula Slade to discussed with the patient further what step will be next in his clinical management.  Ruta Hinds,  MD [facility] Office: [phone] Pager: [phone]

## 2016-03-22 NOTE — Progress Notes (Signed)
RT set up patient CPAP. NO O2 bleed in needed. Patient is able to put on mask hisself. RT told patient to call if he had any trouble.

## 2016-03-22 NOTE — Progress Notes (Signed)
Patient is in cath lab and this time and will be transfer to cardiac unit. TELE dc'ed.  Damico Partin, RN.

## 2016-03-22 NOTE — Progress Notes (Signed)
STROKE TEAM PROGRESS NOTE   SUBJECTIVE (INTERVAL HISTORY) Patient is doing well. He has no complaints today. He is scheduled to undergo a cerebral catheter angiogram today by vascular surgery.   OBJECTIVE Temp:  [98.3 F (36.8 C)-98.7 F (37.1 C)] 98.5 F (36.9 C) (07/24 0448) Pulse Rate:  [70-92] 75 (07/24 0448) Cardiac Rhythm: Normal sinus rhythm (07/24 0700) Resp:  [16-19] 18 (07/24 0448) BP: (142-179)/(45-74) 142/51 (07/24 0448) SpO2:  [92 %-98 %] 94 % (07/24 0448)  CBC:   Recent Labs Lab 03/16/16 2230  03/21/16 0354 03/22/16 0220  WBC 11.6*  < > 8.1 8.2  NEUTROABS 9.1*  --   --   --   HGB 15.6  < > 15.2 14.8  HCT 45.6  < > 46.3 44.8  MCV 88.4  < > 88.4 88.0  PLT 195  < > 188 176  < > = values in this interval not displayed.  Basic Metabolic Panel:   Recent Labs Lab 03/21/16 0354 03/22/16 0220  NA 133* 132*  K 4.0 4.0  CL 95* 92*  CO2 25 28  GLUCOSE 133* 203*  BUN 28* 30*  CREATININE 1.15 1.21  CALCIUM 9.1 9.0    IMAGING No results found.    PHYSICAL EXAM General - Morbid obesity, well developed, no in acute distress.  Ophthalmologic - Fundi not visualized due to acute distress.  Cardiovascular - Regular rate and rhythm.  Mental Status -  Level of arousal and orientation to time, place, and person were intact. Language including expression, naming, repetition, comprehension was assessed and found intact Fund of Knowledge was assessed and was intact.  Cranial Nerves II - XII - II - Visual field intact OU. III, IV, VI - Extraocular movements intact. V - Facial sensation intact bilaterally. VII - Facial movement intact bilaterally. VIII - Hearing & vestibular intact bilaterally. X - Palate elevates symmetrically. XI - Chin turning & shoulder shrug intact bilaterally. XII - Tongue protrusion intact.  Motor Strength - The patient's strength was 4+/5 in all extremities and pronator drift was absent.  Bulk was normal and fasciculations were  absent.   Motor Tone - Muscle tone was assessed at the neck and appendages and was normal.  Reflexes - The patient's reflexes were 1+ in all extremities and he had no pathological reflexes.  Sensory - Light touch, temperature/pinprick were assessed and were symmetrical.    Coordination - The patient had normal movements in the hands with no ataxia or dysmetria.  Tremor was absent.   ASSESSMENT/PLAN Jose [name] is a 65 y.o. male with history of HTN, DM, CAD, HLD, obestiy, OSA, carotid stenosis, CHF and COPD who presented with generalized weakness and near syncope but developed aphasia and right-sided weakness in ambulance on way to [facility]. At [facility], he received IV t-PA 03/16/2016 at 2336. He was transferred to [facility].  L brain TIA with suspicious hypotension in the setting of know left carotid stenosis, s/p IV tPA  Resultant  Expressive aphasia and right hemiplegia, resolved  MRI  No acute stroke  MRA  Left ICA and MCA decreased flow, suggesting proximal ICA high grade stenosis   CTA head and neck left ICA long segment of left ICA string sign.    2D Echo EF 55-60%   LDL unable to calculate due to TG 561  HgbA1c 10.2  SCDs for VTE prophylaxis Diet NPO time specified Except for: Sips with Meds  aspirin 81 mg daily prior to admission (taken off  plavix 2  weeks ago per cardiology), resumed aspirin and plavix 24 h post tPA  Ongoing aggressive stroke risk factor management  Therapy recommendations:  HH PT  Disposition:  pending (lives with family PTA)  L ICA high grade stenosis  1-39% right internal carotid artery stenosis. The  left internal carotid artery appears to be chronically occluded;  unable to adequately visualize. The left external carotid artery  exhibits >50% stenosis. Vertebral arteries are patent with  antegrade flow.  CTA head and neck showed left ICA long segment string sign   BP goal 130-150 due to ICA stenosis  Requested VVS consult for left ICA  intervention, CEA vs. CAS  CHF   Recently discharged for CHF and fluid overload  Followed with Dr. Harl Bowie on 03/03/16  On home meds with lasix 80mg  bid, metoprolol 25mg  bid, lisinopril 40mg  daily, amlodipine 2.5mg  and terazosin 4mg   EF 55-60% this admission  Due to concerns of BP dependent stroke/TIA event, will hold off lisinopril, amlodipine and terazosin, resume half dose of metoprolol but full dose of lasix  Cardiology consult requested  Hypertension  BP 198/78 on arrival  On metoprolol 25 bid, norvasc 2.5 qd, lisinopril 40 qd, terazosin 2 mg q hs and lasix 20 mg daily  resumed lasix 80 g daily  Resume half dose metoprolol 12.5 mg bid  Hold other meds  BP Stable   BP has been running low at home   Long-term BP goal 130-150 due to ICA stenosis  Cardiology consult to help with BP and CHF management, appreciate recs  Hyperlipidemia Hypertriglyceridemia  Home meds:  lipitor 80  LDL unable to calculate, goal < 70  Resume home statin  Added lopid for elevated triglycerides   Continue statin at discharge  Diabetes  HgbA1c 10.2, goal < 7.0  Uncontrolled  Resume home diabetes medications - high dose insulin - increase morning dose to 50U  DM education following  SSI  CBG monitoring  AKI on admission  Cre 2.14 on admission, now 0.96  Metformin resumed  D/c lisinopril  Encourage po intake  Continue lasix  Other Stroke Risk Factors  Advanced age  Former Cigarette smoker  Morbid obesity, Body mass index is 39.68 kg/m., recommend weight loss, diet and exercise as appropriate   Coronary artery disease - previous cath 07/2014 with LM patent, LAD ostial 40-50%, LCX mld disease, RCA occluded filling by collaterals. Unsuccesful attempt at RCA PCI.  - on DAPT since 07/2014 - plavix stopped  - Dr. Harl Bowie following  OSA on CPAP at home  Other Active Problems  COPD on home O2 at hs  Chronic back pain on Vicodin, has used x 20 years  Hyperkalemia,  resolved - K 6.1->5.9->4.8->4.0 -> 4.3    PLAN    cerebral angio by vascular surgery. today CEA vs. CAS.   Discussed with patient and wife and answered questions   I have personally examined this patient, reviewed notes, independently viewed imaging studies, participated in medical decision making and plan of care. I have made any additions or clarifications directly to the above note.  Antony Contras, MD Medical Director [place] Pager: [phone] 03/22/2016 5:24 PM   To contact Stroke Continuity provider, please refer to [email or web address] After hours, contact General Neurology

## 2016-03-22 NOTE — Progress Notes (Signed)
OT Cancellation Note  Patient Details Name: Jose [name] MRN: [id number] DOB: 12/13/1950   Cancelled Treatment:    Reason Eval/Treat Not Completed: Patient declined, no reason specified. Pt states that he continues to not feel well due to being constipated for the last four days. Educated pt on the importance/benefits of mobility. Pt continued to decline and his wife did not encourage him to participate. Will re attempt later as able.  [name] [facility] 03/22/2016, 11:22 AM

## 2016-03-22 NOTE — Interval H&P Note (Signed)
History and Physical Interval Note:  03/22/2016 3:11 PM  Jose [name]  has presented today for surgery, with the diagnosis of carotid stenosis  The various methods of treatment have been discussed with the patient and family. After consideration of risks, benefits and other options for treatment, the patient has consented to  Procedure(s): Carotid Angiography (Bilateral) as a surgical intervention .  The patient's history has been reviewed, patient examined, no change in status, stable for surgery.  I have reviewed the patient's chart and labs.  Questions were answered to the patient's satisfaction.     Ruta Hinds

## 2016-03-22 NOTE — H&P (View-Only) (Signed)
    Subjective  -   No interval change No neuro symptoms   Physical Exam:  Neuro intact     Assessment/Plan:    Plan carotid and cerebral angio tomorrow.  Will determine if Left Carotid is patent, as there is a discrepency between CTA and u/s.  Will also evaluate if patient is a candidate for carotid stenting.  I do not think he is a candidate for CEA, given that the disease extends beyond what is accessible surgically  Sherwin Hollingshed, [place] 03/21/2016 11:17 AM --  Vitals:   03/21/16 0556 03/21/16 1032  BP: (!) 151/51 (!) 154/66  Pulse: 71 83  Resp: 17 17  Temp: 98 F (36.7 C) 98.5 F (36.9 C)    Intake/Output Summary (Last 24 hours) at 03/21/16 1117 Last data filed at 03/21/16 0647  Gross per 24 hour  Intake              240 ml  Output                0 ml  Net              240 ml     Laboratory CBC    Component Value Date/Time   WBC 8.1 03/21/2016 0354   HGB 15.2 03/21/2016 0354   HCT 46.3 03/21/2016 0354   PLT 188 03/21/2016 0354    BMET    Component Value Date/Time   NA 133 (L) 03/21/2016 0354   K 4.0 03/21/2016 0354   CL 95 (L) 03/21/2016 0354   CO2 25 03/21/2016 0354   GLUCOSE 133 (H) 03/21/2016 0354   BUN 28 (H) 03/21/2016 0354   CREATININE 1.15 03/21/2016 0354   CALCIUM 9.1 03/21/2016 0354   GFRNONAA >60 03/21/2016 0354   GFRAA >60 03/21/2016 0354    COAG Lab Results  Component Value Date   INR 1.01 03/16/2016   INR 1.03 08/28/2014   No results found for: PTT  Antibiotics Anti-infectives    None       [name], M.D. [facility] [place] Office: [phone] Pager:  [phone]

## 2016-03-22 NOTE — Op Note (Signed)
ll

## 2016-03-22 NOTE — Progress Notes (Signed)
PT Cancellation Note  Patient Details Name: Jose [name] MRN: [id number] DOB: [birth year]   Cancelled Treatment:    Reason Eval/Treat Not Completed: Patient declined, no reason specified. Pt reports "i don't want to do that." PT asked if pt was in pain, he reported "i'm in constant pain." PT asked about pain meds and patient reports "I got them a little bit ago." Pt educated on importance of mobility. Pt con't to decline to work with PT. PT to return as able.   Kingsley Callander 03/22/2016, 9:15 AM   Kittie Plater, PT, DPT Pager #: [phone] Office #: [phone]

## 2016-03-22 NOTE — Progress Notes (Signed)
Per report from off coming RN, hold antihypertensive and antiglycemic meds at this AM. Pt is aware. Will continue to monitor.   Ave Filter, RN

## 2016-03-22 NOTE — Progress Notes (Signed)
   03/22/16 0900  Clinical Encounter Type  Visited With Patient and family together  Visit Type Follow-up  Referral From Nurse  Consult/Referral To Chaplain  Spiritual Encounters  Spiritual Needs Literature;Emotional  Stress Factors  Patient Stress Factors Health changes  Family Stress Factors Health changes  Advance Directives (For Healthcare)  Does patient have an advance directive? No  Would patient like information on creating an advanced directive? Yes - Scientist, clinical (histocompatibility and immunogenetics) given   Chaplain responded to consult.  Chaplain visited with patient and family and gave them AD paperwork.  Chaplain explained how to fill out paperwork and advised to let nurse know when ready for a notary.  Jose [name] 03/22/16 9:06 AM

## 2016-03-22 NOTE — Progress Notes (Signed)
Site area: Right groin a 5 french arterial sheath was removed  Site Prior to Removal:  Level 0  Pressure Applied For 15 MINUTES    Bedrest Beginning at 1700p  Manual:   Yes.    Patient Status During Pull:  stable  Post Pull Groin Site:  Level 0  Post Pull Instructions Given:  Yes.    Post Pull Pulses Present:  Yes.    Dressing Applied:  Yes.    Comments:  VS remain stable during sheath pull.  Neuro checks WNL

## 2016-03-23 ENCOUNTER — Encounter (HOSPITAL_COMMUNITY): Payer: Self-pay | Admitting: Vascular Surgery

## 2016-03-23 DIAGNOSIS — G458 Other transient cerebral ischemic attacks and related syndromes: Secondary | ICD-10-CM

## 2016-03-23 DIAGNOSIS — G451 Carotid artery syndrome (hemispheric): Secondary | ICD-10-CM

## 2016-03-23 DIAGNOSIS — I5031 Acute diastolic (congestive) heart failure: Secondary | ICD-10-CM

## 2016-03-23 DIAGNOSIS — I2510 Atherosclerotic heart disease of native coronary artery without angina pectoris: Secondary | ICD-10-CM | POA: Diagnosis present

## 2016-03-23 LAB — GLUCOSE, CAPILLARY
Glucose-Capillary: 196 mg/dL — ABNORMAL HIGH (ref 65–99)
Glucose-Capillary: 231 mg/dL — ABNORMAL HIGH (ref 65–99)
Glucose-Capillary: 150 mg/dL — ABNORMAL HIGH (ref 65–99)

## 2016-03-23 LAB — BASIC METABOLIC PANEL
Anion gap: 11 (ref 5–15)
BUN: 30 mg/dL — AB (ref 6–20)
CHLORIDE: 93 mmol/L — AB (ref 101–111)
CO2: 27 mmol/L (ref 22–32)
Calcium: 9 mg/dL (ref 8.9–10.3)
Creatinine, Ser: 1.23 mg/dL (ref 0.61–1.24)
GFR calc Af Amer: >60 mL/min (ref >60)
GFR calc non Af Amer: 60 mL/min — ABNORMAL LOW (ref >60)
GLUCOSE: 227 mg/dL — AB (ref 65–99)
POTASSIUM: 4 mmol/L (ref 3.5–5.1)
Sodium: 131 mmol/L — ABNORMAL LOW (ref 135–145)

## 2016-03-23 MED ORDER — FENOFIBRATE 160 MG PO TABS: 160.0000 mg | ORAL_TABLET | Freq: Every day | ORAL | Status: DC

## 2016-03-23 MED ORDER — POTASSIUM CHLORIDE CRYS ER 20 MEQ PO TBCR: 20.0000 meq | EXTENDED_RELEASE_TABLET | Freq: Every day | ORAL | 0 refills | Status: DC

## 2016-03-23 MED ORDER — CLOPIDOGREL BISULFATE 75 MG PO TABS: 75.0000 mg | ORAL_TABLET | Freq: Every day | ORAL | 2 refills | Status: AC

## 2016-03-23 MED ORDER — FENOFIBRATE 160 MG PO TABS: 160.0000 mg | ORAL_TABLET | Freq: Every day | ORAL | 2 refills | Status: DC

## 2016-03-23 MED ORDER — METOPROLOL TARTRATE 25 MG PO TABS: 12.5000 mg | ORAL_TABLET | Freq: Two times a day (BID) | ORAL | 3 refills | Status: DC

## 2016-03-23 MED ORDER — MUPIROCIN CALCIUM 2 % EX CREA: TOPICAL_CREAM | Freq: Every day | CUTANEOUS | 0 refills | Status: DC

## 2016-03-23 MED ORDER — INSULIN GLARGINE 100 UNIT/ML ~~LOC~~ SOLN: 30.0000 [IU] | SUBCUTANEOUS | 11 refills | Status: DC

## 2016-03-23 NOTE — Plan of Care (Signed)
Problem: Food- and Nutrition-Related Knowledge Deficit (NB-1.1) Goal: Nutrition education Formal process to instruct or train a patient/client in a skill or to impart knowledge to help patients/clients voluntarily manage or modify food choices and eating behavior to maintain or improve health. Outcome: Completed/Met Date Met: 03/23/16  RD consulted for nutrition education regarding a DM/Heart Healthy diet.   Lipid Panel     Component Value Date/Time   CHOL 168 03/17/2016 0453   TRIG 561 (H) 03/17/2016 0453   HDL 26 (L) 03/17/2016 0453   CHOLHDL 6.5 03/17/2016 0453   VLDL UNABLE TO CALCULATE IF TRIGLYCERIDE OVER 400 mg/dL 03/17/2016 0453   LDLCALC UNABLE TO CALCULATE IF TRIGLYCERIDE OVER 400 mg/dL 03/17/2016 0453    RD provided "Carbohydrate Counting for People With Diabetes" and "Heart Healthy Cooking Tips" handout from the Academy of Nutrition and Dietetics. Reviewed patient's dietary recall. Provided examples on ways to decrease sodium and fat intake in diet. Discouraged intake of processed foods and use of salt shaker. Encouraged fresh fruits and vegetables as well as whole grain sources of carbohydrates to maximize fiber intake. Teach back method used.  Expect fair compliance.  Body mass index is 39.68 kg/m. Pt meets criteria for Obesity Class II based on current BMI.  If additional nutrition issues arise, please re-consult RD.  Arthur Holms, RD, LDN Pager #: [phone] After-Hours Pager #: [phone]

## 2016-03-23 NOTE — Progress Notes (Signed)
  Progress Note    03/23/2016 8:15 AM 1 Day Post-Op  Subjective:  Ready to go home; denies any stroke sx  Afebrile HR 70-90's NSR [id number] systolic [id number] RA  Vitals:   03/22/16 2245 03/23/16 0501  BP:  (!) 134/50  Pulse: 72 71  Resp: 18 20  Temp:  98.7 F (37.1 C)    Physical Exam: Neuro:  In tact Lungs:  Non labored Incisions:  Right groin is soft without hematoma   CBC    Component Value Date/Time   WBC 8.2 03/22/2016 0220   RBC 5.09 03/22/2016 0220   HGB 14.8 03/22/2016 0220   HCT 44.8 03/22/2016 0220   PLT 176 03/22/2016 0220   MCV 88.0 03/22/2016 0220   MCH 29.1 03/22/2016 0220   MCHC 33.0 03/22/2016 0220   RDW 13.3 03/22/2016 0220   LYMPHSABS 1.5 03/16/2016 2230   MONOABS 0.9 03/16/2016 2230   EOSABS 0.1 03/16/2016 2230   BASOSABS 0.0 03/16/2016 2230    BMET    Component Value Date/Time   NA 131 (L) 03/23/2016 0217   K 4.0 03/23/2016 0217   CL 93 (L) 03/23/2016 0217   CO2 27 03/23/2016 0217   GLUCOSE 227 (H) 03/23/2016 0217   BUN 30 (H) 03/23/2016 0217   CREATININE 1.23 03/23/2016 0217   CALCIUM 9.0 03/23/2016 0217   GFRNONAA 60 (L) 03/23/2016 0217   GFRAA >60 03/23/2016 0217    INR    Component Value Date/Time   INR 0.93 03/22/2016 0220     Intake/Output Summary (Last 24 hours) at 03/23/16 0815 Last data filed at 03/22/16 1142  Gross per 24 hour  Intake                0 ml  Output                0 ml  Net                0 ml     Assessment:  65 y.o. male is s/p:  #1 normal arch anatomy  #2 greater than 90% left internal carotid artery stenosis severe calcification extending several centimeters into the internal carotid artery  #3 no significant right internal carotid artery stenosis  #4  50% left subclavian artery stenosis exophytic calcified plaque #5 bilateral 70% vertebral artery stenosis left vertebral severely calcified   1 Day Post-Op  Plan: -pt doing well this morning without CVA symptoms -Dr. Trula Slade to discuss  further plan with pt after reviewing carotid angiogram    Leontine Locket, PA-C [facility] [phone] 03/23/2016 8:15 AM

## 2016-03-23 NOTE — Progress Notes (Signed)
Subjective:  No complaints of chest pain or SOB, no residual effects from his TIA  Objective:  Vital Signs in the last 24 hours: Temp:  [98.1 F (36.7 C)-98.8 F (37.1 C)] 98.7 F (37.1 C) (07/25 0501) Pulse Rate:  [70-94] 87 (07/25 0900) Resp:  [11-20] 20 (07/25 0501) BP: (134-193)/(50-71) 148/62 (07/25 0900) SpO2:  [91 %-95 %] 93 % (07/25 0501)  Intake/Output from previous day:  Intake/Output Summary (Last 24 hours) at 03/23/16 1115 Last data filed at 03/22/16 1142  Gross per 24 hour  Intake                0 ml  Output                0 ml  Net                0 ml    Physical Exam: General appearance: alert, cooperative, no distress and morbidly obese Neck: no carotid bruit and no JVD Lungs: clear to auscultation bilaterally Heart: regular rate and rhythm Extremities: no edema Skin: Skin color, texture, turgor normal. No rashes or lesions Neurologic: Grossly normal   Rate: 84  Rhythm: normal sinus rhythm  Lab Results:  Recent Labs  03/21/16 0354 03/22/16 0220  WBC 8.1 8.2  HGB 15.2 14.8  PLT 188 176    Recent Labs  03/22/16 0220 03/23/16 0217  NA 132* 131*  K 4.0 4.0  CL 92* 93*  CO2 28 27  GLUCOSE 203* 227*  BUN 30* 30*  CREATININE 1.21 1.23   No results for input(s): TROPONINI in the last 72 hours.  Invalid input(s): CK, MB  Recent Labs  03/22/16 0220  INR 0.93    Scheduled Meds: . aspirin  81 mg Oral Daily  . atorvastatin  80 mg Oral q1800  . clopidogrel  75 mg Oral Daily  . ferrous sulfate  325 mg Oral TID WC  . furosemide  80 mg Oral BID  . gemfibrozil  600 mg Oral BID AC  . glipiZIDE  10 mg Oral Q breakfast  . glipiZIDE  5 mg Oral BID AC  . insulin aspart  0-15 Units Subcutaneous TID AC & HS  . insulin aspart  30 Units Subcutaneous TID AC  . insulin glargine  50 Units Subcutaneous Daily   And  . insulin glargine  60 Units Subcutaneous QHS  . magnesium oxide  400 mg Oral Daily  . metFORMIN  1,000 mg Oral BID WC  .  metoprolol tartrate  12.5 mg Oral BID  . mupirocin cream   Topical Daily  . oxyCODONE-acetaminophen  2 tablet Oral Q6H  . pantoprazole  40 mg Oral Daily  . polyethylene glycol  17 g Oral Daily  . sertraline  100 mg Oral Daily  . tiotropium  18 mcg Inhalation QHS   Continuous Infusions: . sodium chloride Stopped (03/22/16 2204)   PRN Meds:.acetaminophen **OR** acetaminophen, bisacodyl, hydrALAZINE, labetalol, metoprolol, ondansetron (ZOFRAN) IV   Imaging: Imaging results have been reviewed  Cardiac Studies: 02/26/16 Study Conclusions  - Left ventricle: Poor acoustic windows limit study Definity used   to optimize Overall LVEF appears normal at 55 to 60% The cavity   size was normal. Wall thickness was increased in a pattern of   moderate LVH. Doppler parameters are consistent with abnormal   left ventricular relaxation (grade 1 diastolic dysfunction). - Left atrium: The atrium was moderately dilated.  Transthoracic echocardiography.  M-mode, complete 2D, spectral Doppler, and color  Doppler.  Birthdate:  Patient birthdate: 07-28-1951.  Age:  Patient is 65 yr old.  Sex:  Gender: male. BMI: 43.6 kg/m^2.  Blood pressure:     151/51  Patient status: Inpatient.  Study date:  Study date: 02/26/2016. Study time: 11:48 AM.  Location:  Bedside.  Assessment/Plan:  65 y.o. male, followed by Dr Harl Bowie in [place], with a history of HTN, DM, CAD-s/p failed PCI attempt in Dec 2015, HLD, obestiy, OSA on C-pap, and known carotid stenosis. He was recently admitted with acute on chronic diastolic CHF and COPD. The pt developed generalized weakness and near syncope 03/16/16 and EMS was called. The pt developed aphasia and right-sided weakness in the ambulance on the way to [facility]. At [facility], he received IV t-PA 03/16/2016. He was transferred to [facility]. W/U here revealed no stroke. He has string sign LICA. His neurologic symptoms have resolved. The plan is to keep his B/P on the high side and his medications  have been adjusted. CA angio done yesterday, final recommendations pending.    Principal Problem:   TIA (transient ischemic attack) Active Problems:   Carotid stenosis, symptomatic w/o infarct   Hemispheric carotid artery syndrome   Acute diastolic CHF (congestive heart failure) (HCC)   AKI (acute kidney injury) ([place])   Essential hypertension   Uncontrolled type 2 diabetes mellitus with circulatory disorder (HCC)   Chronic diastolic CHF (congestive heart failure) ([place])   CAD-failed PCI attempt Dec 2015- medical Rx   Morbid obesity ([place])   OSA (obstructive sleep apnea)   HLD (hyperlipidemia)   Hyperglycemia   Hyperkalemia   PLAN: MD to see. Currently his only Cardiac medications are decreased Lopressor dose, Lasix, ASA, and Plavix.   [employer] PA-C 03/23/2016, 11:15 AM [phone]

## 2016-03-23 NOTE — Discharge Summary (Signed)
Stroke Discharge Summary  Patient ID: Jose [name]   MRN: [id number]      DOB: [birth year]  Date of Admission: 03/16/2016 Date of Discharge: 03/23/2016  Attending Physician:  Garvin Fila, MD, Stroke MD Consulting Physician(s):   Julien Girt, RN (wound/ostomy), Jenkins Rouge, MD (cardiology), Serafina Mitchell, MD (Vascular surgery) Patient's PCP:  No PCP Per Patient  DISCHARGE DIAGNOSIS:  Principal Problem:   TIA (transient ischemic attack) - Hemispheric L Carotid Artery Syndrome in setting of hypotension Active Problems:   Acute diastolic CHF (congestive heart failure) ([place])   Morbid obesity ([place])   OSA (obstructive sleep apnea)   AKI (acute kidney injury) ([place])   Carotid stenosis, symptomatic w/o infarct   Hemispheric carotid artery syndrome   HLD (hyperlipidemia)   Essential hypertension   Uncontrolled type 2 diabetes mellitus with circulatory disorder (HCC)   Hyperglycemia   Hyperkalemia   Chronic diastolic CHF (congestive heart failure) ([place])   CAD-failed PCI attempt Dec 2015- medical Rx  BMI: Body mass index is 39.68 kg/m.  Past Medical History:  Diagnosis Date  . CAD (coronary artery disease)    a. prior silent inferior MI, NSTEMI 07/2014 with chronically occluded RCA s/p unsuccessful PCI with otherwise nonobstructive residual disease  . Chronic respiratory failure ([place])    a. Placed on home O2 01/2016.  [name] Kitchen COPD (chronic obstructive pulmonary disease) ([place])   . Diabetes mellitus ([place])   . Former tobacco use   . Hyperlipidemia   . Hypertension   . Morbid obesity ([place])   . Neuropathy ([place])   . Sleep apnea    Past Surgical History:  Procedure Laterality Date  . BACK SURGERY    . LEFT HEART CATHETERIZATION WITH CORONARY ANGIOGRAM N/A 08/28/2014   Procedure: LEFT HEART CATHETERIZATION WITH CORONARY ANGIOGRAM;  Surgeon: Clent Demark, MD;  Location: [facility] CATH LAB;  Service: Cardiovascular;  Laterality: N/A;  . PERIPHERAL VASCULAR CATHETERIZATION Bilateral 03/22/2016    Procedure: Carotid Angiography;  Surgeon: Elam Dutch, MD;  Location: [facility] CV LAB;  Service: Cardiovascular;  Laterality: Bilateral;      Medication List    STOP taking these medications   amLODipine 2.5 MG tablet Commonly known as:  NORVASC   lisinopril 40 MG tablet Commonly known as:  PRINIVIL,ZESTRIL   terazosin 2 MG capsule Commonly known as:  HYTRIN     TAKE these medications   aspirin 81 MG tablet Take 81 mg by mouth daily.   atorvastatin 80 MG tablet Commonly known as:  LIPITOR Take 1 tablet (80 mg total) by mouth daily at 6 PM.   clopidogrel 75 MG tablet Commonly known as:  PLAVIX Take 1 tablet (75 mg total) by mouth daily.   ergocalciferol 50000 units capsule Commonly known as:  VITAMIN D2 Take 50,000 Units by mouth See admin instructions. Take one twice a week on wednesdays and saturdays   fenofibrate 160 MG tablet Take 1 tablet (160 mg total) by mouth daily.   ferrous sulfate 325 (65 FE) MG tablet Take 325 mg by mouth 3 (three) times daily with meals.   Fish Oil 1000 MG Caps Take 2-3 capsules by mouth 3 (three) times daily. Take 7 tables a day to lower triglycerides per list 2 tablets in the morning, 3 tablets at noon, 2 tablets at bedtime   furosemide 80 MG tablet Commonly known as:  LASIX Take 1 tablet (80 mg total) by mouth 2 (two) times daily.   glipiZIDE 5 MG  tablet Commonly known as:  GLUCOTROL Take 5-10 mg by mouth 3 (three) times daily. 10mg  in the morning, 5mg  in the afternoon, 5mg  in the evening   ICAPS AREDS 2 PO Take 1 capsule by mouth 2 (two) times daily.   insulin aspart 100 UNIT/ML injection Commonly known as:  novoLOG Inject 30 Units into the skin 3 (three) times daily before meals.   insulin glargine 100 UNIT/ML injection Commonly known as:  LANTUS Inject 0.3-0.6 mLs (30-60 Units total) into the skin See admin instructions. 50 units in the AM and 60 units in the PM What changed:  additional instructions    magnesium oxide 400 MG tablet Commonly known as:  MAG-OX Take 1 tablet (400 mg total) by mouth daily.   metFORMIN 1000 MG tablet Commonly known as:  GLUCOPHAGE Take 1 tablet (1,000 mg total) by mouth 2 (two) times daily with a meal.   metoprolol tartrate 25 MG tablet Commonly known as:  LOPRESSOR Take 0.5 tablets (12.5 mg total) by mouth 2 (two) times daily. What changed:  how much to take   mupirocin cream 2 % Commonly known as:  BACTROBAN Apply topically daily.   nitroGLYCERIN 0.4 MG SL tablet Commonly known as:  NITROSTAT Place 1 tablet (0.4 mg total) under the tongue every 5 (five) minutes x 3 doses as needed for chest pain.   oxyCODONE-acetaminophen 5-325 MG tablet Commonly known as:  PERCOCET/ROXICET Take 2 tablets by mouth every 4 (four) hours. Take eight tablets daily as needed for pain per family and list. For bad  Back pain   potassium chloride SA 20 MEQ tablet Commonly known as:  K-DUR,KLOR-CON Take 1 tablet (20 mEq total) by mouth daily. What changed:  when to take this   sertraline 100 MG tablet Commonly known as:  ZOLOFT Take 100 mg by mouth daily.   tiotropium 18 MCG inhalation capsule Commonly known as:  SPIRIVA Place 18 mcg into inhaler and inhale at bedtime.   vitamin B-12 1000 MCG tablet Commonly known as:  CYANOCOBALAMIN Take 1,000 mcg by mouth daily.       LABORATORY STUDIES CBC    Component Value Date/Time   WBC 8.2 03/22/2016 0220   RBC 5.09 03/22/2016 0220   HGB 14.8 03/22/2016 0220   HCT 44.8 03/22/2016 0220   PLT 176 03/22/2016 0220   MCV 88.0 03/22/2016 0220   MCH 29.1 03/22/2016 0220   MCHC 33.0 03/22/2016 0220   RDW 13.3 03/22/2016 0220   LYMPHSABS 1.5 03/16/2016 2230   MONOABS 0.9 03/16/2016 2230   EOSABS 0.1 03/16/2016 2230   BASOSABS 0.0 03/16/2016 2230   CMP    Component Value Date/Time   NA 131 (L) 03/23/2016 0217   K 4.0 03/23/2016 0217   CL 93 (L) 03/23/2016 0217   CO2 27 03/23/2016 0217   GLUCOSE 227 (H)  03/23/2016 0217   BUN 30 (H) 03/23/2016 0217   CREATININE 1.23 03/23/2016 0217   CALCIUM 9.0 03/23/2016 0217   PROT 8.1 03/16/2016 2230   ALBUMIN 4.1 03/16/2016 2230   AST 28 03/16/2016 2230   ALT 30 03/16/2016 2230   ALKPHOS 56 03/16/2016 2230   BILITOT 0.5 03/16/2016 2230   GFRNONAA 60 (L) 03/23/2016 0217   GFRAA >60 03/23/2016 0217   COAGS Lab Results  Component Value Date   INR 0.93 03/22/2016   INR 1.01 03/16/2016   INR 1.03 08/28/2014   Lipid Panel    Component Value Date/Time   CHOL 168 03/17/2016 0453  TRIG 561 (H) 03/17/2016 0453   HDL 26 (L) 03/17/2016 0453   CHOLHDL 6.5 03/17/2016 0453   VLDL UNABLE TO CALCULATE IF TRIGLYCERIDE OVER 400 mg/dL 03/17/2016 0453   LDLCALC UNABLE TO CALCULATE IF TRIGLYCERIDE OVER 400 mg/dL 03/17/2016 0453   HgbA1C  Lab Results  Component Value Date   HGBA1C 10.2 (H) 03/17/2016   Urinalysis    Component Value Date/Time   COLORURINE YELLOW 03/17/2016 0630   APPEARANCEUR CLEAR 03/17/2016 0630   LABSPEC 1.015 03/17/2016 0630   PHURINE 6.0 03/17/2016 0630   GLUCOSEU 500 (A) 03/17/2016 0630   HGBUR SMALL (A) 03/17/2016 0630   BILIRUBINUR NEGATIVE 03/17/2016 0630   KETONESUR 15 (A) 03/17/2016 0630   PROTEINUR >300 (A) 03/17/2016 0630   NITRITE NEGATIVE 03/17/2016 0630   LEUKOCYTESUR NEGATIVE 03/17/2016 0630   Urine Drug Screen     Component Value Date/Time   LABOPIA NONE DETECTED 03/17/2016 0630   COCAINSCRNUR NONE DETECTED 03/17/2016 0630   LABBENZ NONE DETECTED 03/17/2016 0630   AMPHETMU NONE DETECTED 03/17/2016 0630   THCU NONE DETECTED 03/17/2016 0630   LABBARB NONE DETECTED 03/17/2016 0630    Alcohol Level    Component Value Date/Time   ETH <5 03/16/2016 2230    SIGNIFICANT DIAGNOSTIC STUDIES  Ct Head Code Stroke Wo Contrast 03/16/2016  No acute finding or change compared to 2015.  Aspects is 10.   MRI and MRA Head  03/17/2016    1. No acute infarct. 2. Periventricular and subcortical white matter disease  bilaterally is asymmetric on the left. 3. Asymmetric decreased flow signal within a tortuous left internal carotid artery and asymmetric flow signal in the left MCA branch vessels. This suggests a more proximal stenosis. Carotid bifurcation analysis is advised. 4. Mild distal small vessel disease without a significant proximal stenosis, aneurysm, or branch vessel occlusion within either the anterior or posterior circulation.   Dg Chest Port 1 View 03/17/2016   Cardiomegaly with mild pulmonary vascular congestion.   Ct Angio Head & Neck W Or Wo Contrast 03/18/2016   1. Bulky and confluent calcification at the left carotid bifurcation and involving the proximal 3.5 cm of the left ICA with tandem RADIOGRAPHIC STRING SIGN stenosis. Despite this the left ICA remains patent. There is downstream mild to moderate cavernous segment stenosis related to extensive ICA siphon calcified plaque. 2. Lesser right carotid calcified plaque with mild to moderate right siphon anterior genu stenosis. 3. Moderate to severe bilateral ACA A1 and right A2 segment stenoses. 4. Posterior circulation calcified plaque. High-grade (75%) proximal left subclavian artery stenosis. No significant left vertebral artery stenosis. Moderate or severe right vertebral artery origin and right V4 segment stenosis. 5.  Stable CT appearance of the brain.   2D echo  - Left ventricle: Poor acoustic windows limit study Definity used to optimize Overall LVEF appears normal at 55 to 60% The cavity size was normal. Wall thickness was increased in a pattern of moderate LVH. Doppler parameters are consistent with abnormal left ventricular relaxation (grade 1 diastolic dysfunction). - Left atrium: The atrium was moderately dilated.  Carotid Angiogram ([place])              #1 normal arch anatomy  #2 greater than 90% left internal carotid artery stenosis severe calcification extending several centimeters into the internal carotid artery   #3no significant right internal carotid artery stenosis  #4 50% left subclavian artery stenosis exophytic calcified plaque                       #  5 bilateral 70% vertebral artery stenosis left vertebral severely calcified     HISTORY OF PRESENT ILLNESS This is a 65-yo man who presented to [facility] ED via EMS after family found him with generalized weakness at home. The patient is aphasic and cannot provide any history. History is thus obtained from his wife and son who arrived in the ICU to visit.   His wife reports that he was in his usual state of health when she left the house this morning and when she spoke to him on the phone this afternoon. His son arrived home this evening around 1800 and heard the patient calling to him from the bathroom. He went in to check on him and noted that the patient seemed pale and was having difficulty standing up. He uses a walker at baseline due to severe neuropathy and chronic back pain. His son had him sit down for a minute and then helped him walk back to his bedroom. The patient required support to ambulate and his son states that he looked like was going to pass out. His wife arrived home shortly after this and called EMS. His family describes generalized weakness without focality and also note that he was communicating normally. En route to the ED he apparently had the abrupt onset of aphasia and right-sided weakness. Per ED MD note, he was found to have expressive and receptive aphasia as well as a right-sided drift. Teleneurology was contacted and the patient was felt to be having an acute stroke with reported initial NIHSS score of 2. He was deemed to be a candidate for tPA; bolus was given at 2336 with infusion started at 2337. Dr. Shon Hale was contacted and accepted the patient in transfer. During transport from OSH he was documented to have NIHSS score of 0.  Of note, his wife reports that he has significant vision problems  related to his diabetes and has had laser treatment in one eye with plans for additional treatment at the [place]. He also reportedly has glaucoma for which he has an upcoming appointment. She also states that he has chronic hearing loss.   He was recently admitted to [facility] from 02/24/16-02/27/16 for an acute diastolic CHF exacerbation. He was diuresed with IV furosemide and discharged on oral furosemide and fluid restriction to continue diuresing. He saw cardiology for follow-up on 03/03/16 and was noted to still be volume overloaded so his furosemide was continued unchanged. He was taken off of Plavix and maintained on aspirin monotherapy during this visit.    HOSPITAL COURSE [name] is a 65 y.o. male with history of HTN, DM, CAD, HLD, obestiy, OSA, carotid stenosis, CHF and COPD who presented with generalized weakness and near syncope but developed aphasia and right-sided weakness in ambulance on way to [facility]. At [facility], he received IV t-PA 03/16/2016 at 2336. He was transferred to [facility].  Hemispheric L Carotid Artery Syndrome L brain TIA with suspicious hypotension in the setting of known left carotid stenosis, s/p IV tPA  Resultant  Expressive aphasia and right hemiplegia, resolved  MRI  No acute stroke  MRA  Left ICA and MCA decreased flow, suggesting proximal ICA high grade stenosis   CTA head and neck left ICA long segment of left ICA string sign.    2D Echo EF 55-60%   LDL unable to calculate due to TG 561  HgbA1c 10.2  aspirin 81 mg daily prior to admission (taken off  plavix 2 weeks ago per cardiology), resumed  aspirin 81 mg and plavix 75 mg daily. Continued at discharge  Ongoing aggressive stroke risk factor management  Therapy recommendations:  HH PT recommended and refused by wife. Pt at baseline. Wheelchair with cushion ordered  (Wheelchair measurements per PT; Wheelchair cushion [phone] with foot rests, desk arms and anti-tippers))  Disposition:  Return home (lives with  wife)  L ICA high grade stenosis  CTA head and neck showed left ICA long segment string sign   BP goal 130-150 due to ICA stenosis  Requested VVS consult for left ICA intervention  Carotid angiogram greater than 90% left internal carotid artery stenosis severe calcification extending several centimeters into the internal carotid artery   Dr. Trula Slade will see in followup in 1 week. No procedure at this time.   CHF   Recently discharged for CHF and fluid overload  Followed with Dr. Harl Bowie on 03/03/16  On home meds with lasix 80mg  bid, metoprolol 25mg  bid, lisinopril 40mg  daily, amlodipine 2.5mg  and terazosin 4mg   EF 55-60% this admission  Due to concerns of BP dependent stroke/TIA event, will hold off lisinopril, amlodipine and terazosin, resume half dose of metoprolol but full dose of lasix  Cardiology consulted. The recommend continue metoprolol. If additional BP med needed, prefer lisinopril over amlodipine  Follow up with Dr. Harl Bowie in [place]  Hypertension  BP 198/78 on arrival  On metoprolol 25 bid, norvasc 2.5 qd, lisinopril 40 qd, terazosin 2 mg q hs and lasix 20 mg daily  resumed lasix 80 g daily  Resume half dose metoprolol 12.5 mg bid  Hold other meds  BP Stable   BP has been running low at home   Long-term BP goal 130-150 due to ICA stenosis  Cardiology consulted. The recommend continue metoprolol. If additional BP med needed, prefer lisinopril over amlodipine  Hyperlipidemia Hypertriglyceridemia  Home meds:  lipitor 80  LDL unable to calculate, goal < 70  Resume home statin  Added lopid  for elevated triglycerides  Given interaction of lopid with statin, changed to fenofibrate at discharge  Continue statin at discharge  Diabetes with insulin resistance  HgbA1c 10.2, goal < 7.0  Uncontrolled  Resume home diabetes medications - high dose insulin - increased morning dose to 50U  AKI on admission  Cre 2.14 on admission, now  1.23  Metformin resumed  D/c lisinopril  Encourage po intake  Continue lasix  Other Stroke Risk Factors  Advanced age  Former Cigarette smoker  Morbid obesity, Body mass index is 39.68 kg/m., recommend weight loss, diet and exercise as appropriate. Dietician consulted related multiple dietary recommendations (low salt, diabetic, low fat, weight loss)  Coronary artery disease - previous cath 07/2014 with LM patent, LAD ostial 40-50%, LCX mld disease, RCA occluded filling by collaterals. Unsuccesful attempt at RCA PCI.  - on DAPT since 07/2014 - plavix recently stopped  - Dr. Harl Bowie following  OSA on CPAP at home, in use during hospitalization  Other Active Problems  COPD on home O2 at hs  Chronic back pain on Vicodin, has used x 20 years  Hyperkalemia, resolved - K 6.1->5.9->4.8->4.0 -> 4.0. Continue half dose potassium at discharge. Follow up and monitor   DISCHARGE EXAM Blood pressure (!) 159/64, pulse 76, temperature 97.5 F (36.4 C), temperature source Oral, resp. rate 19, height 6' (1.829 m), weight 132.7 kg (292 lb 8.8 oz), SpO2 96 %. General - Morbid obesity, well developed, no in acute distress.  Ophthalmologic - Fundi not visualized due to acute distress.  Cardiovascular - Regular  rate and rhythm.  Mental Status -  Level of arousal and orientation to time, place, and person were intact. Language including expression, naming, repetition, comprehension was assessed and found intact Fund of Knowledge was assessed and was intact.  Cranial Nerves II - XII - II - Visual field intact OU. III, IV, VI - Extraocular movements intact. V - Facial sensation intact bilaterally. VII - Facial movement intact bilaterally. VIII - Hearing & vestibular intact bilaterally. X - Palate elevates symmetrically. XI - Chin turning & shoulder shrug intact bilaterally. XII - Tongue protrusion intact.  Motor Strength - The patient's strength was 4+/5 in all extremities and  pronator drift was absent.  Bulk was normal and fasciculations were absent.   Motor Tone - Muscle tone was assessed at the neck and appendages and was normal.  Reflexes - The patient's reflexes were 1+ in all extremities and he had no pathological reflexes.  Sensory - Light touch, temperature/pinprick were assessed and were symmetrical.    Coordination - The patient had normal movements in the hands with no ataxia or dysmetria.  Tremor was absent.   Discharge Diet   Diet Carb Modified Fluid consistency: Thin; Room service appropriate? Yes liquids  DISCHARGE PLAN  Disposition:  Home with wife    aspirin 81 mg daily and clopidogrel 75 mg daily for secondary stroke prevention.  Ongoing risk factor control by Primary Care Physician at time of discharge  Follow up potassium.   Follow up BP, goal 130-150  Follow up with Dr. Harl Bowie in [place]  Follow-up Dr. Trula Slade in 1 week. His office will call you for appt.  Follow-up with Dr. Antony Contras, Stroke Clinic in 2 months, office to schedule an appointment.  45 minutes were spent preparing discharge.  Isanti Deaver for Pager information 03/23/2016 4:43 PM  I have personally examined this patient, reviewed notes, independently viewed imaging studies, participated in medical decision making and plan of care. I have made any additions or clarifications directly to the above note. Agree with note above.    Antony Contras, MD Medical Director [facility] Pager: [phone] 03/24/2016 1:01 PM

## 2016-03-23 NOTE — Progress Notes (Signed)
Physical Therapy Treatment Patient Details Name: Jose [name] MRN: [id number] DOB: 08/21/1951 Today's Date: 03/23/2016    History of Present Illness 65 y.o. male admitted to [facility] on 03/16/16 for weakness and difficulty walking.   In the ambulance en route to the hospital he also started having difficulty speaking with R sided weakness. Dx with presumed L brain stroke s/p tPA. Stroke workup pending.  Pt with significant PMHx of HTN, COPD, DM, neuropathy, CAD, morbid obesity, low back surgery.      PT Comments    Pt admitted with above diagnosis. Pt currently with functional limitations due to balance and endurance deficits. Pt was able to ambulate well today with RW.  Has a 4 wheeled RW at home.  Pt will benefit from a wheelchair for community mobility and measurements as below.  Called CM to inform that wife has been in contact with [facility] in [place], [place] regarding handling the assistance for the wheelchair.  Pt and wife feel he is at his baseline and do not want HHPT.  Should d/c today.   Pt will benefit from skilled PT to increase their independence and safety with mobility to allow discharge to the venue listed below.    Follow Up Recommendations  Supervision/Assistance - 24 hour;No PT follow up (pt and wife do not want HHPT)     Equipment Recommendations  Wheelchair (measurements PT);Wheelchair cushion (measurements PT) (20x22 with foot rests, desk arms and anti-tippers)    Recommendations for Other Services       Precautions / Restrictions Precautions Precautions: Fall Restrictions Weight Bearing Restrictions: No    Mobility  Bed Mobility Overal bed mobility: Modified Independent                Transfers Overall transfer level: Independent                  Ambulation/Gait Ambulation/Gait assistance: Supervision Ambulation Distance (Feet): 175 Feet Assistive device: Rolling walker (2 wheeled) Gait Pattern/deviations: Step-through pattern;Decreased stride length    Gait velocity interpretation: <1.8 ft/sec, indicative of risk for recurrent falls General Gait Details: Pt feels he is back to baseline.  Pt can accept min challenges to balance with RW.  Used RW at home.   Stairs            Wheelchair Mobility    Modified Rankin (Stroke Patients Only) Modified Rankin (Stroke Patients Only) Pre-Morbid Rankin Score: Moderately severe disability Modified Rankin: Moderately severe disability     Balance Overall balance assessment: Needs assistance Sitting-balance support: No upper extremity supported;Feet supported Sitting balance-Leahy Scale: Good     Standing balance support: Bilateral upper extremity supported;During functional activity Standing balance-Leahy Scale: Fair Standing balance comment: can stand statically without UE support.                    Cognition Arousal/Alertness: Awake/alert Behavior During Therapy: WFL for tasks assessed/performed Overall Cognitive Status: Within Functional Limits for tasks assessed                      Exercises General Exercises - Lower Extremity Ankle Circles/Pumps: AROM;Both;5 reps;Seated Long Arc Quad: AROM;Both;5 reps;Seated    General Comments General comments (skin integrity, edema, etc.): Discussed pts' exercise plan when he goes home.  Wife states they will walk daily and start at 10 min and work up to 30 min a day.  They have a treadmill and bike.        Pertinent Vitals/Pain Pain Assessment: No/denies  pain  VSS    Home Living                      Prior Function            PT Goals (current goals can now be found in the care plan section) Acute Rehab PT Goals Patient Stated Goal: wife wants to have [place] for "back up" when he is having a bad day with his back Progress towards PT goals: Progressing toward goals    Frequency  Min 4X/week    PT Plan Current plan remains appropriate    Co-evaluation             End of Session Equipment  Utilized During Treatment: Gait belt Activity Tolerance: Patient tolerated treatment well Patient left: in bed;with call bell/phone within reach;with family/visitor present     Time: [id number] PT Time Calculation (min) (ACUTE ONLY): 32 min  Charges:  $Gait Training: 8-22 mins $Self Care/Home Management: 8-22                    G CodesIrwin [name] F 2016-04-17, 10:12 AM Jose [name],PT Acute Rehabilitation [phone] (pager)

## 2016-03-23 NOTE — Progress Notes (Signed)
STROKE TEAM PROGRESS NOTE   SUBJECTIVE (INTERVAL HISTORY) His wife and OT are at the bedside. Wife feels he is getting around the same - she does not feel he will need therapy.  OBJECTIVE Temp:  [98.1 F (36.7 C)-98.8 F (37.1 C)] 98.7 F (37.1 C) (07/25 0501) Pulse Rate:  [70-94] 87 (07/25 0900) Cardiac Rhythm: Normal sinus rhythm (07/25 0700) Resp:  [11-20] 20 (07/25 0501) BP: (134-193)/(50-71) 148/62 (07/25 0900) SpO2:  [91 %-95 %] 93 % (07/25 0501)  CBC:   Recent Labs Lab 03/16/16 2230  03/21/16 0354 03/22/16 0220  WBC 11.6*  < > 8.1 8.2  NEUTROABS 9.1*  --   --   --   HGB 15.6  < > 15.2 14.8  HCT 45.6  < > 46.3 44.8  MCV 88.4  < > 88.4 88.0  PLT 195  < > 188 176  < > = values in this interval not displayed.  Basic Metabolic Panel:   Recent Labs Lab 03/22/16 0220 03/23/16 0217  NA 132* 131*  K 4.0 4.0  CL 92* 93*  CO2 28 27  GLUCOSE 203* 227*  BUN 30* 30*  CREATININE 1.21 1.23  CALCIUM 9.0 9.0    IMAGING No results found.   Carotid Angiogram (Fields)   #1 normal arch anatomy              #2 greater than 90% left internal carotid artery stenosis severe calcification extending several centimeters into the internal carotid artery              #3 no significant right internal carotid artery stenosis              #4  50% left subclavian artery stenosis exophytic calcified plaque       #5 bilateral 70% vertebral artery stenosis left vertebral severely calcified   PHYSICAL EXAM General - Morbid obesity, well developed, no in acute distress.  Ophthalmologic - Fundi not visualized due to acute distress.  Cardiovascular - Regular rate and rhythm.  Mental Status -  Level of arousal and orientation to time, place, and person were intact. Language including expression, naming, repetition, comprehension was assessed and found intact Fund of Knowledge was assessed and was intact.  Cranial Nerves II - XII - II - Visual field intact OU. III, IV, VI -  Extraocular movements intact. V - Facial sensation intact bilaterally. VII - Facial movement intact bilaterally. VIII - Hearing & vestibular intact bilaterally. X - Palate elevates symmetrically. XI - Chin turning & shoulder shrug intact bilaterally. XII - Tongue protrusion intact.  Motor Strength - The patient's strength was 4+/5 in all extremities and pronator drift was absent.  Bulk was normal and fasciculations were absent.   Motor Tone - Muscle tone was assessed at the neck and appendages and was normal.  Reflexes - The patient's reflexes were 1+ in all extremities and he had no pathological reflexes.  Sensory - Light touch, temperature/pinprick were assessed and were symmetrical.    Coordination - The patient had normal movements in the hands with no ataxia or dysmetria.  Tremor was absent.   ASSESSMENT/PLAN Jose [name] is a 65 y.o. male with history of HTN, DM, CAD, HLD, obestiy, OSA, carotid stenosis, CHF and COPD who presented with generalized weakness and near syncope but developed aphasia and right-sided weakness in ambulance on way to [facility]. At [facility], he received IV t-PA 03/16/2016 at 2336. He was transferred to [facility].  L brain TIA with suspicious  hypotension in the setting of know left carotid stenosis, s/p IV tPA  Resultant  Expressive aphasia and right hemiplegia, resolved  MRI  No acute stroke  MRA  Left ICA and MCA decreased flow, suggesting proximal ICA high grade stenosis   CTA head and neck left ICA long segment of left ICA string sign.    2D Echo EF 55-60%   LDL unable to calculate due to TG 561  HgbA1c 10.2  SCDs for VTE prophylaxis Diet Carb Modified Fluid consistency: Thin; Room service appropriate? Yes  aspirin 81 mg daily prior to admission (taken off  plavix 2 weeks ago per cardiology), resumed aspirin 81 mg and plavix 75 mg daily  Ongoing aggressive stroke risk factor management  Therapy recommendations:  HH PT    Disposition:  Return home  (lives with family PTA)  L ICA high grade stenosis  1-39% right internal carotid artery stenosis. The  left internal carotid artery appears to have severe stenosis with calcification  unable to adequately visualize. The left external carotid artery  exhibits >50% stenosis. Vertebral arteries are patent with  antegrade flow.  CTA head and neck showed left ICA long segment string sign   BP goal 130-150 due to ICA stenosis  Requested VVS consult for left ICA intervention, CEA vs. CAS Carotid angiogram greater than 90% left internal carotid artery stenosis severe calcification extending several centimeters into the internal carotid artery     CHF   Recently discharged for CHF and fluid overload  Followed with Dr. Harl Bowie on 03/03/16  On home meds with lasix 80mg  bid, metoprolol 25mg  bid, lisinopril 40mg  daily, amlodipine 2.5mg  and terazosin 4mg   EF 55-60% this admission  Due to concerns of BP dependent stroke/TIA event, will hold off lisinopril, amlodipine and terazosin, resume half dose of metoprolol but full dose of lasix  Cardiology consult requested  Hypertension  BP 198/78 on arrival  On metoprolol 25 bid, norvasc 2.5 qd, lisinopril 40 qd, terazosin 2 mg q hs and lasix 20 mg daily  resumed lasix 80 g daily  Resume half dose metoprolol 12.5 mg bid  Hold other meds  BP Stable   BP has been running low at home   Long-term BP goal 130-150 due to ICA stenosis  Cardiology consult to help with BP and CHF management, appreciate recs  Hyperlipidemia Hypertriglyceridemia  Home meds:  lipitor 80  LDL unable to calculate, goal < 70  Resume home statin  Added lopid  for elevated triglycerides   changing gemfibrozil to fenofibrate to decrease risk of rhabdo with statin therapy.      Continue statin at discharge  Diabetes with insulin resistance  HgbA1c 10.2, goal < 7.0  Uncontrolled  Resume home diabetes medications - high dose insulin - increase morning  dose to 50U  DM education following  SSI  CBG monitoring  AKI on admission  Cre 2.14 on admission, now 1.23  Metformin resumed  D/c lisinopril  Encourage po intake  Continue lasix  Other Stroke Risk Factors  Advanced age  Former Cigarette smoker  Morbid obesity, Body mass index is 39.68 kg/m., recommend weight loss, diet and exercise as appropriate   Coronary artery disease - previous cath 07/2014 with LM patent, LAD ostial 40-50%, LCX mld disease, RCA occluded filling by collaterals. Unsuccesful attempt at RCA PCI.  - on DAPT since 07/2014 - plavix stopped  - Dr. Harl Bowie following  OSA on CPAP at home, in use during hospitalization  Other Active Problems  COPD on home  O2 at hs  Chronic back pain on Vicodin, has used x 20 years  Hyperkalemia, resolved - K 6.1->5.9->4.8->4.0 -> 4.0  Diet and exercise recommended. Wife would like to speak to a nutritionist related to multiple dietary restrictions - diabetes, low salt, low fat.  [name] KitchenAwait vascular surgery decision on Lt CEA versus PTA/stent and dc home after that I have personally examined this patient, reviewed notes, independently viewed imaging studies, participated in medical decision making and plan of care. I have made any additions or clarifications directly to the above note.    Antony Contras, MD Medical Director [place] Pager: [phone] 03/23/2016 12:50 PM    To contact Stroke Continuity provider, please refer to [email or web address] After hours, contact General Neurology

## 2016-03-24 ENCOUNTER — Telehealth: Payer: Self-pay | Admitting: Surgery

## 2016-03-24 NOTE — Telephone Encounter (Signed)
VWB approved 2 m f/u and added a bilat carotid. Sched appt 05/24/16; lab at 1:00 and MD at 1:45. Spoke to pt's wife to inform them of appt.

## 2016-03-24 NOTE — Telephone Encounter (Signed)
-----   Message from Mena Goes, RN sent at 03/23/2016  3:30 PM EDT ----- Regarding: Office visit 1-2 weeks   ----- Message ----- From: Serafina Mitchell, MD Sent: 03/23/2016   1:59 PM To: Vvs Charge Pool  Schedule follow up in office in 1-2 weeks

## 2016-03-24 NOTE — Telephone Encounter (Signed)
RE: Office visit 1-2 weeks  Received: Today  Message Contents  Seleta Rhymes, RN        I spoke to the pt's wife to sch this; she said that VWB thought he would not be able to see the pt yesterday but he was able to. She said that he told her that since he saw the pt in the hosp the pt's f/u will be in 2 months instead.   I did not see note in Epic about this.   Previous Messages    ----- Message -----  From: Mena Goes, RN  Sent: 03/23/2016  3:30 PM  To: [place] Pool  Subject: Office visit 1-2 weeks                ----- Message -----  From: Serafina Mitchell, MD  Sent: 03/23/2016  1:59 PM  To: Vvs Charge Pool   Schedule follow up in office in 1-2 weeks

## 2016-03-30 ENCOUNTER — Other Ambulatory Visit: Payer: Self-pay

## 2016-03-30 NOTE — Patient Outreach (Signed)
I received notification that Jose [name] indicated he had some questions regarding his medications on the EMMI report.  I called him and received permission to speak to his wife.  She stated he was having muscle spasms which were similar to the ones he was having when he was one atorvastatin previously.  She stated he will be seeing his cardiologist on Thursday and she will be discussing this further with him.  I stated this was a reasonable plan.  I told her there were other statins that he may tolerate better. She appreciated my call and was definitely going to discuss further with the cardiologist.  Deanne Coffer, PharmD, [place] [phone]

## 2016-03-31 ENCOUNTER — Other Ambulatory Visit: Payer: Self-pay | Admitting: *Deleted

## 2016-03-31 NOTE — Patient Outreach (Addendum)
[facility]) Care Management  03/31/2016  RONDEY BROOMFIELD 08-Dec-1950 [id number]   EMMI-Stroke dashboard referral/question about medication. Telephone call to patient; spouse answered call and advised that she is out of town & that spouse is at home currently and does not have access to phone.   Advised better to call back later in week to speak with patient.   Plan: Follow up with patient.   Sherrin Daisy, RN BSN [place] Management Coordinator [facility] Care Management  [phone]

## 2016-04-01 ENCOUNTER — Ambulatory Visit (INDEPENDENT_AMBULATORY_CARE_PROVIDER_SITE_OTHER): Payer: Medicare Other | Admitting: Cardiology

## 2016-04-01 ENCOUNTER — Other Ambulatory Visit (HOSPITAL_COMMUNITY)
Admission: RE | Admit: 2016-04-01 | Discharge: 2016-04-01 | Disposition: A | Discharge status: Home / Self Care | Payer: Medicare Other | Source: Ambulatory Visit | Attending: Cardiology | Admitting: Cardiology

## 2016-04-01 ENCOUNTER — Encounter (INDEPENDENT_AMBULATORY_CARE_PROVIDER_SITE_OTHER): Payer: Self-pay

## 2016-04-01 ENCOUNTER — Other Ambulatory Visit: Payer: Self-pay | Admitting: *Deleted

## 2016-04-01 ENCOUNTER — Encounter: Payer: Self-pay | Admitting: Cardiology

## 2016-04-01 VITALS — BP 130/68 | HR 96 | Ht 72.0 in | Wt 288.0 lb

## 2016-04-01 DIAGNOSIS — E785 Hyperlipidemia, unspecified: Secondary | ICD-10-CM | POA: Diagnosis not present

## 2016-04-01 DIAGNOSIS — I5032 Chronic diastolic (congestive) heart failure: Secondary | ICD-10-CM | POA: Diagnosis not present

## 2016-04-01 DIAGNOSIS — I2510 Atherosclerotic heart disease of native coronary artery without angina pectoris: Secondary | ICD-10-CM

## 2016-04-01 DIAGNOSIS — I639 Cerebral infarction, unspecified: Secondary | ICD-10-CM

## 2016-04-01 DIAGNOSIS — I10 Essential (primary) hypertension: Secondary | ICD-10-CM | POA: Insufficient documentation

## 2016-04-01 DIAGNOSIS — G451 Carotid artery syndrome (hemispheric): Secondary | ICD-10-CM | POA: Diagnosis not present

## 2016-04-01 DIAGNOSIS — I6522 Occlusion and stenosis of left carotid artery: Secondary | ICD-10-CM

## 2016-04-01 LAB — BASIC METABOLIC PANEL
Anion gap: 10 (ref 5–15)
BUN: 42 mg/dL — ABNORMAL HIGH (ref 6–20)
CO2: 28 mmol/L (ref 22–32)
Calcium: 9.7 mg/dL (ref 8.9–10.3)
Chloride: 89 mmol/L — ABNORMAL LOW (ref 101–111)
Creatinine, Ser: 1.42 mg/dL — ABNORMAL HIGH (ref 0.61–1.24)
GFR calc Af Amer: 58 mL/min — ABNORMAL LOW (ref >60)
GFR calc non Af Amer: 50 mL/min — ABNORMAL LOW (ref >60)
Glucose, Bld: 291 mg/dL — ABNORMAL HIGH (ref 65–99)
Potassium: 5.4 mmol/L — ABNORMAL HIGH (ref 3.5–5.1)
Sodium: 127 mmol/L — ABNORMAL LOW (ref 135–145)

## 2016-04-01 LAB — CK: Total CK: 77 U/L (ref 49–397)

## 2016-04-01 NOTE — Patient Instructions (Signed)
Your physician recommends that you schedule a follow-up appointment in: 8 Weeks with Dr. Harl Bowie.   Your physician has recommended you make the following change in your medication:  Crestor 5 mg Two times Weekly   If you need a refill on your cardiac medications before your next appointment, please call your pharmacy.  Thank you for choosing [facility]!

## 2016-04-01 NOTE — Patient Outreach (Signed)
[facility]) Care Management  04/01/2016  Jose [name] 06-06-1951 [id number]  Telephone call to patient; left message on voice mail requesting call back.  Plan: will follow up. Sherrin Daisy, RN BSN [place] Management Coordinator [facility] Care Management  [phone]

## 2016-04-01 NOTE — Progress Notes (Signed)
04/01/2016 Jose [name]   Oct 09, 1950  [id number]  Primary Physician Cleophas Dunker, MD Primary Cardiologist: Dr Harl Bowie  HPI:  65 y.o.male, followed by Dr Harl Bowie in [place] a history of HTN, DM, CAD-s/p failed PCI attempt in Dec 2015, HLD, obestiy, OSA on C-pap, and known carotid stenosis. He was recently admitted with acute on chronic diastolic CHF and COPD. The pt developed generalized weakness and near syncope 03/16/16 and EMS was called. The pt developed aphasia and right-sided weakness in the ambulance on the way to [facility]. At [facility], he receivedIV t-PA 03/16/2016. He was transferred to [facility]. W/U here revealed no stroke. He has string sign LICA. His neurologic symptoms have resolved. The plan is to keep his B/P on the high side and his medications have been adjusted. He is in the office today for follow up. The pt and his wife tell me Dr Trula Slade has decided medical Rx is the best option for him. The pt denies any localized weakness. His wife says he has had problems with muscle weakness and "jerking of his arms and legs" which they attributed to Lipitor which was added after his cath Dec 2015. Apparently he had this problems with weakness on Lipitor prior to Dec 2015 and it was stopped. He has since stopped his Lipitor and his symptoms have improved, though he has chronic back pain and he is in a wheel chair today-"good days and bad days".    Current Outpatient Prescriptions  Medication Sig Dispense Refill  . aspirin 81 MG tablet Take 81 mg by mouth daily.    . clopidogrel (PLAVIX) 75 MG tablet Take 1 tablet (75 mg total) by mouth daily. 30 tablet 2  . ergocalciferol (VITAMIN D2) 50000 UNITS capsule Take 50,000 Units by mouth See admin instructions. Take one twice a week on wednesdays and saturdays    . ferrous sulfate 325 (65 FE) MG tablet Take 325 mg by mouth 3 (three) times daily with meals.    . furosemide (LASIX) 80 MG tablet Take 1 tablet (80 mg total) by mouth 2 (two) times daily. 60  tablet 0  . glipiZIDE (GLUCOTROL) 5 MG tablet Take 5-10 mg by mouth 3 (three) times daily. 10mg  in the morning, 5mg  in the afternoon, 5mg  in the evening    . insulin aspart (NOVOLOG) 100 UNIT/ML injection Inject 30 Units into the skin 3 (three) times daily before meals.     . insulin glargine (LANTUS) 100 UNIT/ML injection Inject 0.3-0.6 mLs (30-60 Units total) into the skin See admin instructions. 50 units in the AM and 60 units in the PM 10 mL 11  . magnesium oxide (MAG-OX) 400 MG tablet Take 1 tablet (400 mg total) by mouth daily. 34 tablet 3  . metFORMIN (GLUCOPHAGE) 1000 MG tablet Take 1 tablet (1,000 mg total) by mouth 2 (two) times daily with a meal. 60 tablet 3  . metoprolol tartrate (LOPRESSOR) 25 MG tablet Take 0.5 tablets (12.5 mg total) by mouth 2 (two) times daily. 180 tablet 3  . Multiple Vitamins-Minerals (ICAPS AREDS 2 PO) Take 1 capsule by mouth 2 (two) times daily.     . mupirocin cream (BACTROBAN) 2 % Apply topically daily. 15 g 0  . nitroGLYCERIN (NITROSTAT) 0.4 MG SL tablet Place 1 tablet (0.4 mg total) under the tongue every 5 (five) minutes x 3 doses as needed for chest pain. 25 tablet 12  . Omega-3 Fatty Acids (FISH OIL) 1000 MG CAPS Take 2-3 capsules by mouth 3 (three) times  daily. Take 7 tables a day to lower triglycerides per list 2 tablets in the morning, 3 tablets at noon, 2 tablets at bedtime    . oxyCODONE-acetaminophen (PERCOCET/ROXICET) 5-325 MG per tablet Take 2 tablets by mouth every 4 (four) hours. Take eight tablets daily as needed for pain per family and list. For bad  Back pain    . potassium chloride SA (K-DUR,KLOR-CON) 20 MEQ tablet Take 1 tablet (20 mEq total) by mouth daily. 60 tablet 0  . sertraline (ZOLOFT) 100 MG tablet Take 100 mg by mouth daily.    [name] Kitchen tiotropium (SPIRIVA) 18 MCG inhalation capsule Place 18 mcg into inhaler and inhale at bedtime.     . vitamin B-12 (CYANOCOBALAMIN) 1000 MCG tablet Take 1,000 mcg by mouth daily.     No current  facility-administered medications for this visit.     No Known Allergies  Social History   Social History  . Marital status: Unknown    Spouse name: N/A  . Number of children: N/A  . Years of education: N/A   Occupational History  . Not on file.   Social History Main Topics  . Smoking status: Former Smoker    Packs/day: 3.00    Years: 45.00    Types: Cigarettes  . Smokeless tobacco: Never Used  . Alcohol use No  . Drug use: Unknown  . Sexual activity: Not on file   Other Topics Concern  . Not on file   Social History Narrative  . No narrative on file     Review of Systems: General: negative for chills, fever, night sweats or weight changes.  Cardiovascular: negative for chest pain, dyspnea on exertion, edema, orthopnea, palpitations, paroxysmal nocturnal dyspnea or shortness of breath Dermatological: negative for rash Respiratory: negative for cough or wheezing Urologic: negative for hematuria Abdominal: negative for nausea, vomiting, diarrhea, bright red blood per rectum, melena, or hematemesis Neurologic: negative for visual changes, syncope, or dizziness All other systems reviewed and are otherwise negative except as noted above.    Blood pressure 130/68, pulse 96, height 6' (1.829 m), weight 288 lb (130.6 kg), SpO2 91 %.  General appearance: alert, cooperative, no distress and moderately obese Lungs: clear to auscultation bilaterally Heart: regular rate and rhythm Abdomen: obese Skin: Skin color, texture, turgor normal. No rashes or lesions Neurologic: Grossly normal    ASSESSMENT AND PLAN:  Principal Problem:   Hemispheric carotid artery syndrome Active Problems:   Carotid stenosis, symptomatic w/o infarct   Essential hypertension    type 2 diabetes mellitus with circulatory disorder (HCC)   Chronic diastolic CHF (congestive heart failure) (HCC)   CAD-failed PCI attempt Dec 2015- medical Rx   Morbid obesity ([place])   OSA (obstructive sleep apnea)    HLD (hyperlipidemia)-intol to Lipitor    PLAN  Check BMP and CK today. Leave him off statin for another week, then try Crestor 5 mg twice a week. Plan is to accept higher systolic B/P-as high as 0000000. F/U Dr Harl Bowie after he sees Dr Trula Slade in Sept.   Kerin Ransom PA-C 04/01/2016 2:02 PM

## 2016-04-02 ENCOUNTER — Other Ambulatory Visit: Payer: Self-pay | Admitting: *Deleted

## 2016-04-02 ENCOUNTER — Telehealth: Payer: Self-pay | Admitting: *Deleted

## 2016-04-02 ENCOUNTER — Other Ambulatory Visit: Payer: Self-pay | Admitting: Cardiology

## 2016-04-02 DIAGNOSIS — N289 Disorder of kidney and ureter, unspecified: Secondary | ICD-10-CM

## 2016-04-02 NOTE — Telephone Encounter (Signed)
-----   Message from Erlene Quan, [place] sent at 04/02/2016 11:51 AM EDT ----- Labs suggest pt is dehydrated. Hold Lasix tonight, start Lasix 40 mg twice a day in am. Stop K+ till Monday. He needs a follow up BMP next week on Thursday.  Kerin Ransom PA-C 04/02/2016 11:51 AM

## 2016-04-02 NOTE — Patient Outreach (Signed)
[facility]) Care Management  04/02/2016  Jose [name] 05-20-1951 [id number]  EMMI-Stroke referral from Dashboard red on "questions about meds"  -  Medication question has already been addressed by [facility] pharmacist.  Incoming call received from patient who responded to message from RN CM.  Patient was advised of reason for call.  Patient gave HIPPA verification.   Patient voices that he was recently admitted to hospital with symptoms of weakness in arms & legs, mental incoherence and trouble speaking.  States all of symptoms have resolved and he has not had to go to emergency room or be admitted to hospital since discharge on 03/23/2016.  Patient's spouse states patient has history of carotid problem and recent admission was a combination of  carotid blockage & low blood pressure.  Patient states he would not hesitate to call [phone] if symptoms reoccur or have family call if he is unable to do so .  Patient & spouse were advised of other stroke-like symptoms.   Patient voices that spouse is managing his medications and that he is taking as prescribed by his doctors.  No problems getting prescriptions filled. Spouse is taking him to MD appointments. Most recently had appointment with cardiologist and has appointment with primary care at [facility] clinic in [place], [place].   EMMI-stroke call completed.  Plan:  Close out /send to care management assistant.  Sherrin Daisy, RN BSN [place] Management Coordinator [facility] Care Management  [phone]

## 2016-04-02 NOTE — Telephone Encounter (Signed)
Spoke with pt wife, she understood and repeated medication instructions. Lab orders placed.

## 2016-04-05 ENCOUNTER — Ambulatory Visit: Payer: Non-veteran care | Admitting: Surgery

## 2016-04-07 NOTE — Consult Note (Signed)
NAMELOCKLIN, STANICK NO.:  [id number]  MEDICAL RECORD NO.:  [id number]  LOCATION:  2W21C                        FACILITY:  [facility]  PHYSICIAN:  Grizel Vesely K. Lainie Daubert, M.D.DATE OF BIRTH:  02/03/1951  DATE OF CONSULTATION: DATE OF DISCHARGE:  03/23/2016                                CONSULTATION   CLINICAL HISTORY:  Patient with carotid stenosis on the left.  EXAMINATION:  Intracranial interpretation of bilateral common carotid arteriograms.  The left common carotid arteriogram demonstrates moderate tortuosity of the distal cervical, 1/3 of the left internal carotid artery.  The petrous cavernous and supraclinoid segments are widely patent.  The left middle cerebral artery and the left anterior cerebral artery are seen to pass into the capillary and venous phases.  There is slow sign of contrast into the left cerebral hemisphere secondary to the severe stenosis proximally in the left common carotid bifurcation.  The right common carotid arteriogram demonstrates brisk antegrade flow to the distal right internal carotid artery cervical region.  There is mild narrowing at the petrous camera site in the proximal cavernous junctions.  A right posterior communicating artery is seen opacifying the right posterior cerebral artery distribution.  The right middle cerebral artery is seen to opacify normally into the capillary and venous phases.  Hypoplastic right anterior cerebral artery A-1 segment is noted. However, flow is also noted into the right anterior cerebral artery A-2 segment, and via the anterior communicating artery of the left anterior cerebral A-2 segment.  IMPRESSION:  Mild focal area of narrowing involving the right internal carotid artery petrous cavernous junction, and the proximal cavernous, right ICA.  Slow antegrade flow into the left internal carotid artery, extracranially and intracranially and into the left middle and left anterior  cerebral artery secondary to the high-grade severe stenosis of the left internal carotid artery proximally.          ______________________________ Fritz Pickerel Estanislado Pandy, M.D.     SKD/MEDQ  D:  04/07/2016  T:  04/07/2016  Job:  [id number]

## 2016-04-08 DIAGNOSIS — I10 Essential (primary) hypertension: Secondary | ICD-10-CM | POA: Diagnosis not present

## 2016-04-08 DIAGNOSIS — G451 Carotid artery syndrome (hemispheric): Secondary | ICD-10-CM | POA: Diagnosis not present

## 2016-04-08 DIAGNOSIS — E785 Hyperlipidemia, unspecified: Secondary | ICD-10-CM | POA: Diagnosis not present

## 2016-04-09 ENCOUNTER — Other Ambulatory Visit: Payer: Self-pay | Admitting: *Deleted

## 2016-04-09 LAB — BASIC METABOLIC PANEL
BUN: 31 mg/dL — ABNORMAL HIGH (ref 7–25)
CO2: 25 mmol/L (ref 20–31)
Calcium: 9.4 mg/dL (ref 8.6–10.3)
Chloride: 89 mmol/L — ABNORMAL LOW (ref 98–110)
Creat: 1.29 mg/dL — ABNORMAL HIGH (ref 0.70–1.25)
Glucose, Bld: 299 mg/dL — ABNORMAL HIGH (ref 65–99)
Potassium: 5.4 mmol/L — ABNORMAL HIGH (ref 3.5–5.3)
Sodium: 128 mmol/L — ABNORMAL LOW (ref 135–146)

## 2016-04-09 LAB — CK: Total CK: 84 U/L (ref 7–232)

## 2016-04-12 ENCOUNTER — Other Ambulatory Visit: Payer: Self-pay | Admitting: Cardiology

## 2016-04-12 DIAGNOSIS — E875 Hyperkalemia: Secondary | ICD-10-CM

## 2016-04-14 ENCOUNTER — Telehealth: Payer: Self-pay | Admitting: Cardiology

## 2016-04-14 ENCOUNTER — Other Ambulatory Visit: Payer: Self-pay | Admitting: *Deleted

## 2016-04-14 DIAGNOSIS — E875 Hyperkalemia: Secondary | ICD-10-CM

## 2016-04-14 NOTE — Telephone Encounter (Signed)
Spoke with pt wife, aware pt needs to have lab work checked in 2 weeks.

## 2016-04-14 NOTE — Telephone Encounter (Signed)
Returning Melinda's call from yesterday,cocerning his Potassium.

## 2016-04-15 ENCOUNTER — Encounter (INDEPENDENT_AMBULATORY_CARE_PROVIDER_SITE_OTHER): Payer: Non-veteran care | Admitting: Ophthalmology

## 2016-04-15 DIAGNOSIS — H353114 Nonexudative age-related macular degeneration, right eye, advanced atrophic with subfoveal involvement: Secondary | ICD-10-CM

## 2016-04-15 DIAGNOSIS — E11311 Type 2 diabetes mellitus with unspecified diabetic retinopathy with macular edema: Secondary | ICD-10-CM

## 2016-04-15 DIAGNOSIS — H35033 Hypertensive retinopathy, bilateral: Secondary | ICD-10-CM | POA: Diagnosis not present

## 2016-04-15 DIAGNOSIS — I10 Essential (primary) hypertension: Secondary | ICD-10-CM

## 2016-04-15 DIAGNOSIS — E113512 Type 2 diabetes mellitus with proliferative diabetic retinopathy with macular edema, left eye: Secondary | ICD-10-CM

## 2016-04-15 DIAGNOSIS — H4312 Vitreous hemorrhage, left eye: Secondary | ICD-10-CM | POA: Diagnosis not present

## 2016-04-15 DIAGNOSIS — E113391 Type 2 diabetes mellitus with moderate nonproliferative diabetic retinopathy without macular edema, right eye: Secondary | ICD-10-CM

## 2016-05-07 DIAGNOSIS — E875 Hyperkalemia: Secondary | ICD-10-CM | POA: Diagnosis not present

## 2016-05-08 LAB — BASIC METABOLIC PANEL
BUN: 16 mg/dL (ref 7–25)
CO2: 29 mmol/L (ref 20–31)
Calcium: 9.6 mg/dL (ref 8.6–10.3)
Chloride: 92 mmol/L — ABNORMAL LOW (ref 98–110)
Creat: 0.96 mg/dL (ref 0.70–1.25)
Glucose, Bld: 110 mg/dL — ABNORMAL HIGH (ref 65–99)
Potassium: 4.3 mmol/L (ref 3.5–5.3)
Sodium: 136 mmol/L (ref 135–146)

## 2016-05-17 ENCOUNTER — Other Ambulatory Visit (INDEPENDENT_AMBULATORY_CARE_PROVIDER_SITE_OTHER): Payer: Non-veteran care | Admitting: Ophthalmology

## 2016-05-18 ENCOUNTER — Encounter: Payer: Self-pay | Admitting: Surgery

## 2016-05-21 ENCOUNTER — Other Ambulatory Visit: Payer: Self-pay | Admitting: *Deleted

## 2016-05-21 DIAGNOSIS — I6523 Occlusion and stenosis of bilateral carotid arteries: Secondary | ICD-10-CM

## 2016-05-24 ENCOUNTER — Encounter: Payer: Self-pay | Admitting: Surgery

## 2016-05-24 ENCOUNTER — Ambulatory Visit (HOSPITAL_COMMUNITY)
Admission: RE | Admit: 2016-05-24 | Discharge: 2016-05-24 | Disposition: A | Discharge status: Home / Self Care | Payer: Medicare Other | Source: Ambulatory Visit | Attending: Surgery | Admitting: Surgery

## 2016-05-24 ENCOUNTER — Ambulatory Visit (INDEPENDENT_AMBULATORY_CARE_PROVIDER_SITE_OTHER): Payer: Medicare Other | Admitting: Surgery

## 2016-05-24 VITALS — BP 153/76 | HR 83 | Temp 98.2°F | Resp 16 | Ht 72.0 in | Wt 294.0 lb

## 2016-05-24 DIAGNOSIS — I6521 Occlusion and stenosis of right carotid artery: Secondary | ICD-10-CM | POA: Diagnosis not present

## 2016-05-24 DIAGNOSIS — I639 Cerebral infarction, unspecified: Secondary | ICD-10-CM

## 2016-05-24 DIAGNOSIS — I6522 Occlusion and stenosis of left carotid artery: Secondary | ICD-10-CM | POA: Diagnosis not present

## 2016-05-24 DIAGNOSIS — I6523 Occlusion and stenosis of bilateral carotid arteries: Secondary | ICD-10-CM | POA: Diagnosis not present

## 2016-05-24 LAB — VAS US CAROTID
LCCAPDIAS: 5 cm/s
LCCAPSYS: 58 cm/s
LEFT VERTEBRAL DIAS: 20 cm/s
Left CCA dist dias: -5 cm/s
Left CCA dist sys: -26 cm/s
Left ICA prox sys: -255 cm/s
RCCAPDIAS: 11 cm/s
RIGHT CCA MID DIAS: 14 cm/s
RIGHT ECA DIAS: -10 cm/s
Right CCA prox sys: 73 cm/s
Right cca dist sys: -80 cm/s

## 2016-05-24 NOTE — Progress Notes (Signed)
[facility] [place]  Patient name: Jose [name] MRN: [id number] DOB: Feb 27, 1951 Sex: male  REASON FOR VISIT: follow-up  HPI: Jose [name] is a 65 y.o. male who presents for follow-up status post carotid angiogram on 03/22/2016. The patient was originally seen in consult on 03/19/2016 for a TIA in the setting of known left carotid stenosis and hypotension. From review of his CT scan, his calcification appeared to extend well above the angle of the mandible. He therefore underwent carotid angiogram that revealed greater than 90% left internal carotid artery stenosis and no significant right internal carotid artery stenosis.  The patient denies any amaurosis fugax, he has some chronic visual issues related to diabetes. He will be undergoing cataract surgery soon. He denies any sudden onset weakness or numbness of his extremities or expressive/receptive aphasia. He denies any dizziness.  He is currently trying to lose weight. His ambulation is limited by balance issues, diabetic neuropathy and back pain. He takes aspirin and Plavix daily. He is unable to tolerate statins. He is on a beta blocker for hypertension. He is on Lasix for CHF. He is a former smoker quitting 7 years ago. He does use oxygen at home.  Past Medical History:  Diagnosis Date  . CAD (coronary artery disease)    a. prior silent inferior MI, NSTEMI 07/2014 with chronically occluded RCA s/p unsuccessful PCI with otherwise nonobstructive residual disease  . Chronic respiratory failure ([place])    a. Placed on home O2 01/2016.  [name] Kitchen COPD (chronic obstructive pulmonary disease) ([place])   . Diabetes mellitus ([place])   . Former tobacco use   . Hyperlipidemia   . Hypertension   . Morbid obesity ([place])   . Neuropathy ([place])   . Sleep apnea     History reviewed. No pertinent family history.  SOCIAL HISTORY: Social History  Substance Use Topics  . Smoking status: Former Smoker    Packs/day: 3.00    Years: 45.00      Types: Cigarettes  . Smokeless tobacco: Never Used  . Alcohol use No    No Known Allergies  Current Outpatient Prescriptions  Medication Sig Dispense Refill  . aspirin 81 MG tablet Take 81 mg by mouth daily.    . clopidogrel (PLAVIX) 75 MG tablet Take 1 tablet (75 mg total) by mouth daily. 30 tablet 2  . ergocalciferol (VITAMIN D2) 50000 UNITS capsule Take 50,000 Units by mouth See admin instructions. Take one twice a week on wednesdays and saturdays    . ferrous sulfate 325 (65 FE) MG tablet Take 325 mg by mouth 3 (three) times daily with meals.    . furosemide (LASIX) 80 MG tablet Take 1 tablet (80 mg total) by mouth 2 (two) times daily. 60 tablet 0  . glipiZIDE (GLUCOTROL) 5 MG tablet Take 5-10 mg by mouth 3 (three) times daily. 10mg  in the morning, 5mg  in the afternoon, 5mg  in the evening    . insulin aspart (NOVOLOG) 100 UNIT/ML injection Inject 30 Units into the skin 3 (three) times daily before meals.     . insulin glargine (LANTUS) 100 UNIT/ML injection Inject 0.3-0.6 mLs (30-60 Units total) into the skin See admin instructions. 50 units in the AM and 60 units in the PM 10 mL 11  . magnesium oxide (MAG-OX) 400 MG tablet Take 1 tablet (400 mg total) by mouth daily. 34 tablet 3  . metFORMIN (GLUCOPHAGE) 1000 MG tablet Take 1 tablet (1,000 mg total) by mouth 2 (two) times  daily with a meal. 60 tablet 3  . metoprolol tartrate (LOPRESSOR) 25 MG tablet Take 0.5 tablets (12.5 mg total) by mouth 2 (two) times daily. 180 tablet 3  . Multiple Vitamins-Minerals (ICAPS AREDS 2 PO) Take 1 capsule by mouth 2 (two) times daily.     . mupirocin cream (BACTROBAN) 2 % Apply topically daily. (Patient taking differently: Apply topically daily as needed. ) 15 g 0  . nitroGLYCERIN (NITROSTAT) 0.4 MG SL tablet Place 1 tablet (0.4 mg total) under the tongue every 5 (five) minutes x 3 doses as needed for chest pain. 25 tablet 12  . Omega-3 Fatty Acids (FISH OIL) 1000 MG CAPS Take 2-3 capsules by mouth 3  (three) times daily. Take 7 tables a day to lower triglycerides per list 2 tablets in the morning, 3 tablets at noon, 2 tablets at bedtime    . oxyCODONE-acetaminophen (PERCOCET/ROXICET) 5-325 MG per tablet Take 2 tablets by mouth every 4 (four) hours. Take eight tablets daily as needed for pain per family and list. For bad  Back pain    . sertraline (ZOLOFT) 100 MG tablet Take 100 mg by mouth daily.    [name] Kitchen tiotropium (SPIRIVA) 18 MCG inhalation capsule Place 18 mcg into inhaler and inhale at bedtime.     . vitamin B-12 (CYANOCOBALAMIN) 1000 MCG tablet Take 1,000 mcg by mouth daily.     No current facility-administered medications for this visit.     REVIEW OF SYSTEMS:  [X]  denotes positive finding, [ ]  denotes negative finding Cardiac  Comments:  Chest pain or chest pressure:    Shortness of breath upon exertion: x   Short of breath when lying flat:    Irregular heart rhythm:        Vascular    Pain in calf, thigh, or hip brought on by ambulation:    Pain in feet at night that wakes you up from your sleep:     Blood clot in your veins:    Leg swelling:  x       Pulmonary    Oxygen at home: x   Productive cough:     Wheezing:         Neurologic    Sudden weakness in arms or legs:     Sudden numbness in arms or legs:     Sudden onset of difficulty speaking or slurred speech:    Temporary loss of vision in one eye:     Problems with dizziness:         Gastrointestinal    Blood in stool:     Vomited blood:         Genitourinary    Burning when urinating:     Blood in urine:        Psychiatric    Major depression:         Hematologic    Bleeding problems:    Problems with blood clotting too easily:        Skin    Rashes or ulcers:        Constitutional    Fever or chills:      PHYSICAL EXAM: Vitals:   05/24/16 1411 05/24/16 1415  BP: (!) 166/78 (!) 153/76  Pulse: 83   Resp: 16   Temp: 98.2 F (36.8 C)   TempSrc: Oral   SpO2: 94%   Weight: 294 lb (133.4  kg)   Height: 6' (1.829 m)     GENERAL: The patient is a well-nourished obese  male, in no acute distress. The vital signs are documented above. CARDIAC: There is a regular rate and rhythm. No carotid bruits. VASCULAR: Radial pulses symmetric bilaterally. PULMONARY: There is good air exchange bilaterally without wheezing or rales. MUSCULOSKELETAL: 2+ pitting edema lower extremities bilaterally. NEUROLOGIC: No focal deficits. SKIN: There are no ulcers or rashes noted. PSYCHIATRIC: The patient has a normal affect.  DATA:  Carotid duplex 05/24/2016  The left internal carotid artery is occluded. On the right, there is less than 40% internal carotid artery stenosis. Vertebral arteries are patent antegrade flow bilaterally.  MEDICAL ISSUES: Left internal carotid artery occlusion Less than 40% right internal carotid artery stenosis  The patient has been asymptomatic without any TIA or stroke symptoms. His left internal carotid artery is occluded on duplex today. Previously, on carotid angiogram it appeared to be greater than 90% stenosed and surgically inaccessible. He is on aspirin and Plavix daily. He is intolerant of statins. Plan for repeat carotid duplex in one year.     Virgina Jock, PA-C [facility] [place]    I agree with the above.  I have seen and evaluated the patient.  He initially presented in July 2017 with an acute stroke.  He was having difficulty standing.  During workup he was found to have a high-grade stenosis within the left carotid artery.  After I reviewed the CT scan, calcification extended well up above the angle of the mandible up near the carotid siphon.  For that reason I did not feel he was a good candidate for carotid endarterectomy and therefore I scheduled him for angiography.  On angiography he was found to have a nearly occluded left carotid artery with significant calcification.  I also did not feel that he was a good candidate for  carotid stenting because of the degree of calcification.  We ended up recommending medical therapy.  Patient is back today for follow-up he has had no interval symptoms.  Ultrasound today shows that he has had a silent interval occlusion of his left carotid artery.  I discussed the above findings with the patient and his wife.  I recommend continued medical management.  The patient is intolerant of statins.  He will continue with antiplatelet therapy.  We will need to monitor the right carotid artery which by today's ultrasound had minimal disease.  I have him scheduled for follow-up in 1 year.  Annamarie Major

## 2016-05-27 ENCOUNTER — Encounter (INDEPENDENT_AMBULATORY_CARE_PROVIDER_SITE_OTHER): Payer: Non-veteran care | Admitting: Ophthalmology

## 2016-05-27 DIAGNOSIS — E11311 Type 2 diabetes mellitus with unspecified diabetic retinopathy with macular edema: Secondary | ICD-10-CM

## 2016-05-27 DIAGNOSIS — E113512 Type 2 diabetes mellitus with proliferative diabetic retinopathy with macular edema, left eye: Secondary | ICD-10-CM

## 2016-06-02 ENCOUNTER — Telehealth: Payer: Self-pay | Admitting: Cardiology

## 2016-06-02 ENCOUNTER — Ambulatory Visit (INDEPENDENT_AMBULATORY_CARE_PROVIDER_SITE_OTHER): Payer: Medicare Other | Admitting: Cardiology

## 2016-06-02 ENCOUNTER — Encounter: Payer: Self-pay | Admitting: Cardiology

## 2016-06-02 VITALS — BP 104/78 | HR 73 | Ht 72.0 in | Wt 293.0 lb

## 2016-06-02 DIAGNOSIS — E782 Mixed hyperlipidemia: Secondary | ICD-10-CM

## 2016-06-02 DIAGNOSIS — I2510 Atherosclerotic heart disease of native coronary artery without angina pectoris: Secondary | ICD-10-CM | POA: Diagnosis not present

## 2016-06-02 DIAGNOSIS — I5032 Chronic diastolic (congestive) heart failure: Secondary | ICD-10-CM | POA: Diagnosis not present

## 2016-06-02 DIAGNOSIS — R1010 Upper abdominal pain, unspecified: Secondary | ICD-10-CM | POA: Diagnosis not present

## 2016-06-02 DIAGNOSIS — I10 Essential (primary) hypertension: Secondary | ICD-10-CM

## 2016-06-02 DIAGNOSIS — Z136 Encounter for screening for cardiovascular disorders: Secondary | ICD-10-CM

## 2016-06-02 DIAGNOSIS — I639 Cerebral infarction, unspecified: Secondary | ICD-10-CM

## 2016-06-02 MED ORDER — PRAVASTATIN SODIUM 20 MG PO TABS: 20.0000 mg | ORAL_TABLET | ORAL | 3 refills | Status: DC

## 2016-06-02 NOTE — Progress Notes (Signed)
Clinical Summary Jose [name] is a 65 y.o.male seen today for follow up of the following medical problems .   1. Chronic diastolic HF - admit late [id number] with acute on chronic diastolic HF - diuresed, discharge weight 315 lbs.  01/2016 echo LVEF 0000000, grade I diastolic dysfunction   - can have some LE edema at times. Taking lasix 40mg  bid. Home weights around 288 lbs, overall stable.    2. CAD - previous cath 07/2014 with LM patent, LAD ostial 40-50%, LCX mld disease, RCA occluded filling by collaterals. Unsuccesful attempt at RCA PCI.  - denies any recent chest pain.  - has been on DAPT since 07/2014.  - - during recent admit with acute on chronic diastolic HF and HTN, peak troponin 0.24 - echo with normal LVEF, no WMAs  - no recent chest pain  3.HTN - compliant with meds  4. Hyperlipidemia - compliant with statin  - 01/2016 TC 113 TG 265 HDL 26 LDL 34 - elevated TGs in setting of uncontrolled DM2.   - muscle aches on lipitor. Tried on cresto 5mg  bid. Crestor caused significant side effects and she stoppe dtaking.   5. COPD - compliant with spiriva.   6. OSA - compliant with CPAP  7. CVA - admit 02/2016 with right sided weakness, received tPA.  - from prior notes higher bp goal due to severe carotid stenosis, which is followed by vascular. - most recent US shows LICA is now occluded.  Past Medical History:  Diagnosis Date  . CAD (coronary artery disease)    a. prior silent inferior MI, NSTEMI 07/2014 with chronically occluded RCA s/p unsuccessful PCI with otherwise nonobstructive residual disease  . Chronic respiratory failure ([place])    a. Placed on home O2 01/2016.  [name] Kitchen COPD (chronic obstructive pulmonary disease) ([place])   . Diabetes mellitus ([place])   . Former tobacco use   . Hyperlipidemia   . Hypertension   . Morbid obesity ([place])   . Neuropathy ([place])   . Sleep apnea      No Known Allergies   Current Outpatient Prescriptions  Medication Sig  Dispense Refill  . aspirin 81 MG tablet Take 81 mg by mouth daily.    . clopidogrel (PLAVIX) 75 MG tablet Take 1 tablet (75 mg total) by mouth daily. 30 tablet 2  . ergocalciferol (VITAMIN D2) 50000 UNITS capsule Take 50,000 Units by mouth See admin instructions. Take one twice a week on wednesdays and saturdays    . ferrous sulfate 325 (65 FE) MG tablet Take 325 mg by mouth 3 (three) times daily with meals.    . furosemide (LASIX) 80 MG tablet Take 1 tablet (80 mg total) by mouth 2 (two) times daily. 60 tablet 0  . glipiZIDE (GLUCOTROL) 5 MG tablet Take 5-10 mg by mouth 3 (three) times daily. 10mg  in the morning, 5mg  in the afternoon, 5mg  in the evening    . insulin aspart (NOVOLOG) 100 UNIT/ML injection Inject 30 Units into the skin 3 (three) times daily before meals.     . insulin glargine (LANTUS) 100 UNIT/ML injection Inject 0.3-0.6 mLs (30-60 Units total) into the skin See admin instructions. 50 units in the AM and 60 units in the PM 10 mL 11  . magnesium oxide (MAG-OX) 400 MG tablet Take 1 tablet (400 mg total) by mouth daily. 34 tablet 3  . metFORMIN (GLUCOPHAGE) 1000 MG tablet Take 1 tablet (1,000 mg total) by mouth 2 (two) times daily with a  meal. 60 tablet 3  . metoprolol tartrate (LOPRESSOR) 25 MG tablet Take 0.5 tablets (12.5 mg total) by mouth 2 (two) times daily. 180 tablet 3  . Multiple Vitamins-Minerals (ICAPS AREDS 2 PO) Take 1 capsule by mouth 2 (two) times daily.     . mupirocin cream (BACTROBAN) 2 % Apply topically daily. (Patient taking differently: Apply topically daily as needed. ) 15 g 0  . nitroGLYCERIN (NITROSTAT) 0.4 MG SL tablet Place 1 tablet (0.4 mg total) under the tongue every 5 (five) minutes x 3 doses as needed for chest pain. 25 tablet 12  . Omega-3 Fatty Acids (FISH OIL) 1000 MG CAPS Take 2-3 capsules by mouth 3 (three) times daily. Take 7 tables a day to lower triglycerides per list 2 tablets in the morning, 3 tablets at noon, 2 tablets at bedtime    .  oxyCODONE-acetaminophen (PERCOCET/ROXICET) 5-325 MG per tablet Take 2 tablets by mouth every 4 (four) hours. Take eight tablets daily as needed for pain per family and list. For bad  Back pain    . sertraline (ZOLOFT) 100 MG tablet Take 100 mg by mouth daily.    [name] Kitchen tiotropium (SPIRIVA) 18 MCG inhalation capsule Place 18 mcg into inhaler and inhale at bedtime.     . vitamin B-12 (CYANOCOBALAMIN) 1000 MCG tablet Take 1,000 mcg by mouth daily.     No current facility-administered medications for this visit.      Past Surgical History:  Procedure Laterality Date  . BACK SURGERY    . LEFT HEART CATHETERIZATION WITH CORONARY ANGIOGRAM N/A 08/28/2014   Procedure: LEFT HEART CATHETERIZATION WITH CORONARY ANGIOGRAM;  Surgeon: Clent Demark, MD;  Location: [facility] CATH LAB;  Service: Cardiovascular;  Laterality: N/A;  . PERIPHERAL VASCULAR CATHETERIZATION Bilateral 03/22/2016   Procedure: Carotid Angiography;  Surgeon: Elam Dutch, MD;  Location: [facility] CV LAB;  Service: Cardiovascular;  Laterality: Bilateral;     No Known Allergies    No family history on file.   Social History Jose [name] reports that he has quit smoking. His smoking use included Cigarettes. He has a 135.00 pack-year smoking history. He has never used smokeless tobacco. Jose [name] reports that he does not drink alcohol.   Review of Systems CONSTITUTIONAL: No weight loss, fever, chills, weakness or fatigue.  HEENT: Eyes: No visual loss, blurred vision, double vision or yellow sclerae.No hearing loss, sneezing, congestion, runny nose or sore throat.  SKIN: No rash or itching.  CARDIOVASCULAR: per HPI RESPIRATORY: No shortness of breath, cough or sputum.  GASTROINTESTINAL: No anorexia, nausea, vomiting or diarrhea. No abdominal pain or blood.  GENITOURINARY: No burning on urination, no polyuria NEUROLOGICAL: No headache, dizziness, syncope, paralysis, ataxia, numbness or tingling in the extremities. No change in bowel or  bladder control.  MUSCULOSKELETAL: No muscle, back pain, joint pain or stiffness.  LYMPHATICS: No enlarged nodes. No history of splenectomy.  PSYCHIATRIC: No history of depression or anxiety.  ENDOCRINOLOGIC: No reports of sweating, cold or heat intolerance. No polyuria or polydipsia.  [name] Kitchen   Physical Examination Vitals:   06/02/16 1047  BP: 104/78  Pulse: 73   Vitals:   06/02/16 1047  Weight: 293 lb (132.9 kg)  Height: 6' (1.829 m)    Gen: resting comfortably, no acute distress HEENT: no scleral icterus, pupils equal round and reactive, no palptable cervical adenopathy,  CV: RRR, no m/r/g, no jvd Resp: Clear to auscultation bilaterally GI: abdomen is soft, non-tender, non-distended, normal bowel sounds, no hepatosplenomegaly MSK: extremities are  warm, 1-2+ bilateral LE edema Skin: warm, no rash Neuro:  no focal deficits Psych: appropriate affect   Diagnostic Studies 07/2014 cath FINDINGS: LV showed inferobasal wall severe hypokinesia, EF of 50-55%. Left main was long which was patent. LAD has 40-50% ostial stenosis and 20-30% proximal stenosis. Diagonal 1 and 2 were small which had mild disease. Ramus was small which was patent. Left circumflex has mild disease. OM1 is moderate sized, which is patent. OM 2 is small but long vessel, which has mild disease. RCA was 100% occluded beyond proximal portion filling by bridging collaterals and also collaterals from the left system.  INTERVENTIONAL PROCEDURE: Attempted to pass the wire into RCA using multiple wires i.e. ChoICE PT, [place], Runthrough, Fielder FC, and [employer]. Finally Fielder XT wire could be passed up to the mid portion of the RCA, but no balloon catheter could be advanced beyond the proximal portion, initially tried 2.5 x 12 mm long and then 1.20 x 20 mm long without success. The patient did not have any episodes of chest pain during the procedure. The patient tolerated the procedure  well. There were no complications. The patient was transferred to recovery room in stable condition. Plan is to treat him medically.     Assessment and Plan   1. Chronic diastolic HF - weights trending down, edema improvign, Continue current diuretics.   2. CAD -no recent symptoms, continue current meds  3. HTN - at goal, continue current meds  4. Hyperlipidemia - counseled on diet and exercise to improve HDL and TGs, LDL at goal - will try pravastatin 20mg  once weekly due to side effects on other statins.   5. AAA screen - 65 yo male history of tobacco use, order AAA screening US  F/u 21months     Arnoldo Lenis, M.D.

## 2016-06-02 NOTE — Patient Instructions (Signed)
Medication Instructions:  START PRAVASTATIN 20MG  - TAKE 1 TABLET ONCE WEEKLY   Labwork: NONE  Testing/Procedures: Your physician has requested that you have an abdominal aorta duplex. During this test, an ultrasound is used to evaluate the aorta. Allow 30 minutes for this exam. Do not eat after midnight the day before and avoid carbonated beverages      Follow-Up: Your physician wants you to follow-up in: 6 MONTHS .  You will receive a reminder letter in the mail two months in advance. If you don't receive a letter, please call our office to schedule the follow-up appointment.   Any Other Special Instructions Will Be Listed Below (If Applicable).     If you need a refill on your cardiac medications before your next appointment, please call your pharmacy.

## 2016-06-02 NOTE — Telephone Encounter (Signed)
Phone numbers for [facility] to [facility] from visit today [phone] / [phone]

## 2016-06-07 ENCOUNTER — Ambulatory Visit: Payer: Self-pay | Admitting: Neurology

## 2016-06-09 ENCOUNTER — Ambulatory Visit (HOSPITAL_COMMUNITY): Admission: RE | Admit: 2016-06-09 | Payer: Medicare Other | Source: Ambulatory Visit

## 2016-06-14 ENCOUNTER — Ambulatory Visit (HOSPITAL_COMMUNITY)
Admission: RE | Admit: 2016-06-14 | Discharge: 2016-06-14 | Disposition: A | Discharge status: Home / Self Care | Payer: Medicare Other | Source: Ambulatory Visit | Attending: Cardiology | Admitting: Cardiology

## 2016-06-14 DIAGNOSIS — I714 Abdominal aortic aneurysm, without rupture: Secondary | ICD-10-CM | POA: Diagnosis not present

## 2016-06-14 DIAGNOSIS — R1010 Upper abdominal pain, unspecified: Secondary | ICD-10-CM | POA: Diagnosis present

## 2016-06-30 ENCOUNTER — Other Ambulatory Visit: Payer: Self-pay

## 2016-06-30 NOTE — Patient Outreach (Signed)
First telephone outreach attempt  to obtain mRS for patient. No answer at number provided. Left message for return call. Will try again this week.   Jacqulynn Cadet  [facility] Care Management Assistant

## 2016-07-02 ENCOUNTER — Other Ambulatory Visit: Payer: Self-pay

## 2016-07-02 NOTE — Patient Outreach (Signed)
Second telephone outreach attempt to obtain mRS. No answer, left message for return call. Will try again next week.   Jacqulynn Cadet  [facility] Care Management Assistant

## 2016-07-05 ENCOUNTER — Other Ambulatory Visit: Payer: Self-pay

## 2016-07-05 NOTE — Patient Outreach (Signed)
Third outreach call to patient to obtain mRS. Wife answered and will have patient return call as soon as she can assist patient with call because patient has hearing impairment.   Jacqulynn Cadet  [facility] Care Management Assistant

## 2016-07-07 NOTE — Patient Outreach (Signed)
[facility]) Care Management  07/07/2016  ZIMIR STENBERG 1951-05-27 [id number]   3 telephone outreach attempts were completed to obtain mRS for patient. mRS could not be obtained because messages left for the patient requesting a return phone call but no return call received.    Jacqulynn Cadet  [facility] Care Management Assistant

## 2016-07-16 NOTE — Patient Outreach (Signed)
[facility]) Care Management  07/16/2016  Jose [name] September 17, 1950 [id number]   Addition to previous note: Unable to obtain mRS score. Telephone outreach attempts not returned by 07/06/16. mRS = [place], [place] Management Assistant

## 2016-07-29 NOTE — Addendum Note (Signed)
Addended by: Lianne Cure A on: 07/29/2016 12:54 PM   Modules accepted: Orders

## 2016-09-28 ENCOUNTER — Ambulatory Visit (INDEPENDENT_AMBULATORY_CARE_PROVIDER_SITE_OTHER): Payer: Non-veteran care | Admitting: Ophthalmology

## 2016-10-11 ENCOUNTER — Ambulatory Visit (INDEPENDENT_AMBULATORY_CARE_PROVIDER_SITE_OTHER): Payer: Non-veteran care | Admitting: Ophthalmology

## 2016-10-11 DIAGNOSIS — I10 Essential (primary) hypertension: Secondary | ICD-10-CM | POA: Diagnosis not present

## 2016-10-11 DIAGNOSIS — E11311 Type 2 diabetes mellitus with unspecified diabetic retinopathy with macular edema: Secondary | ICD-10-CM | POA: Diagnosis not present

## 2016-10-11 DIAGNOSIS — H43813 Vitreous degeneration, bilateral: Secondary | ICD-10-CM

## 2016-10-11 DIAGNOSIS — E113512 Type 2 diabetes mellitus with proliferative diabetic retinopathy with macular edema, left eye: Secondary | ICD-10-CM

## 2016-10-11 DIAGNOSIS — E113391 Type 2 diabetes mellitus with moderate nonproliferative diabetic retinopathy without macular edema, right eye: Secondary | ICD-10-CM

## 2016-10-11 DIAGNOSIS — H35033 Hypertensive retinopathy, bilateral: Secondary | ICD-10-CM

## 2016-10-28 ENCOUNTER — Other Ambulatory Visit (INDEPENDENT_AMBULATORY_CARE_PROVIDER_SITE_OTHER): Payer: Non-veteran care | Admitting: Ophthalmology

## 2016-11-10 ENCOUNTER — Other Ambulatory Visit (INDEPENDENT_AMBULATORY_CARE_PROVIDER_SITE_OTHER): Payer: Non-veteran care | Admitting: Ophthalmology

## 2016-11-10 DIAGNOSIS — E11311 Type 2 diabetes mellitus with unspecified diabetic retinopathy with macular edema: Secondary | ICD-10-CM | POA: Diagnosis not present

## 2016-11-10 DIAGNOSIS — E113391 Type 2 diabetes mellitus with moderate nonproliferative diabetic retinopathy without macular edema, right eye: Secondary | ICD-10-CM | POA: Diagnosis not present

## 2017-01-14 DIAGNOSIS — L89622 Pressure ulcer of left heel, stage 2: Secondary | ICD-10-CM | POA: Diagnosis not present

## 2017-01-14 DIAGNOSIS — M79672 Pain in left foot: Secondary | ICD-10-CM | POA: Diagnosis not present

## 2017-01-14 DIAGNOSIS — M7732 Calcaneal spur, left foot: Secondary | ICD-10-CM | POA: Diagnosis not present

## 2017-01-25 ENCOUNTER — Encounter (HOSPITAL_BASED_OUTPATIENT_CLINIC_OR_DEPARTMENT_OTHER): Payer: Non-veteran care

## 2017-03-14 ENCOUNTER — Ambulatory Visit (INDEPENDENT_AMBULATORY_CARE_PROVIDER_SITE_OTHER): Payer: Non-veteran care | Admitting: Ophthalmology

## 2017-03-14 DIAGNOSIS — E113411 Type 2 diabetes mellitus with severe nonproliferative diabetic retinopathy with macular edema, right eye: Secondary | ICD-10-CM

## 2017-03-14 DIAGNOSIS — H43813 Vitreous degeneration, bilateral: Secondary | ICD-10-CM

## 2017-03-14 DIAGNOSIS — E113512 Type 2 diabetes mellitus with proliferative diabetic retinopathy with macular edema, left eye: Secondary | ICD-10-CM | POA: Diagnosis not present

## 2017-03-14 DIAGNOSIS — E11311 Type 2 diabetes mellitus with unspecified diabetic retinopathy with macular edema: Secondary | ICD-10-CM

## 2017-03-14 DIAGNOSIS — H35033 Hypertensive retinopathy, bilateral: Secondary | ICD-10-CM | POA: Diagnosis not present

## 2017-03-14 DIAGNOSIS — I10 Essential (primary) hypertension: Secondary | ICD-10-CM | POA: Diagnosis not present

## 2017-05-30 ENCOUNTER — Ambulatory Visit: Payer: Non-veteran care | Admitting: Family

## 2017-05-30 ENCOUNTER — Encounter (HOSPITAL_COMMUNITY): Payer: Non-veteran care

## 2017-06-30 DIAGNOSIS — I639 Cerebral infarction, unspecified: Secondary | ICD-10-CM

## 2017-06-30 HISTORY — DX: Cerebral infarction, unspecified: I63.9

## 2017-07-08 IMAGING — CT CT ANGIO NECK
1 of 12 series · 1 of 33 positions shown · IV contrast (isovue)
Comparison: Brain MRI and intracranial MRA a 03/17/2016. Head CT
without contrast 03/16/2016.

CLINICAL DATA: 65-year-old male with symptomatic left carotid
stenosis. Initial encounter.

EXAM:
CT ANGIOGRAPHY HEAD AND NECK
TECHNIQUE: Multidetector CT imaging of the head and neck was performed using
the standard protocol during bolus administration of intravenous
contrast. Multiplanar CT image reconstructions and MIPs were
obtained to evaluate the vascular anatomy. Carotid stenosis
measurements (when applicable) are obtained utilizing NASCET
criteria, using the distal internal carotid diameter as the
denominator.
CONTRAST:  50 mL Isovue 370

[Series 300: locator · axial · 0.49mm/px · 1 of 1 slices shown]
[im 1/1  soft-tissue]
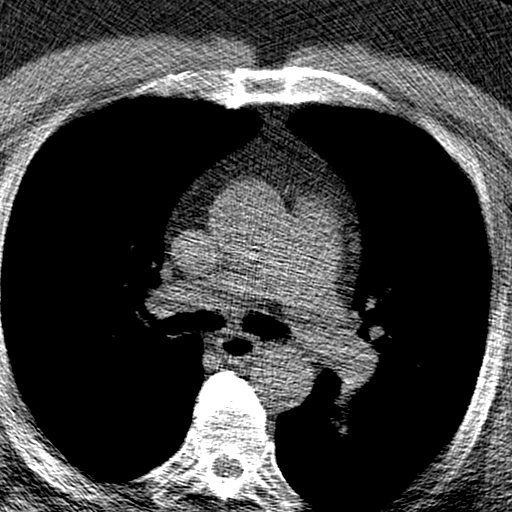

[1 of 33 positions shown; findings below may reference images not displayed]

FINDINGS: CT HEAD

Brain: Stable gray-white matter differentiation throughout the
brain. No cortically based acute infarct identified. No acute
intracranial hemorrhage identified. No midline shift, mass effect,
or evidence of intracranial mass lesion. No ventriculomegaly.

Calvarium and skull base: Stable and intact.

Paranasal sinuses: Stable and well pneumatized. Sphenoid sinus
chronic periosteal thickening.

Orbits: Negative orbits and scalp.

CTA NECK

Skeleton: Absent maxillary dentition. Degenerative changes in the
lower cervical and upper thoracic spine. No acute osseous
abnormality identified.

Other neck: Mild respiratory motion artifact, some centrilobular
emphysema suspected. No superior mediastinal lymphadenopathy.

Negative thyroid, larynx, pharynx, parapharyngeal spaces,
retropharyngeal space, sublingual space, submandibular glands, and
parotid glands. No cervical lymphadenopathy.

Aortic arch: 3 vessel arch configuration with mild arch but moderate
great vessel origin calcified atherosclerosis.

Right carotid system: No brachiocephalic artery or right CCA origin
stenosis despite calcified plaque. Soft and calcified plaque at the
right carotid bifurcation extending to the right ICA bulb. Less than
50 % stenosis with respect to the distal vessel.

Left carotid system: No left CCA origin stenosis despite calcified
plaque. Partially retropharyngeal positioning of the left carotid
bifurcation. Bulky 11 mm calcification occupying the left carotid
bifurcation with intermittent radiographic string sign stenosis at
the left ICA origin and bulb (series 501, images 88 and 93). Despite
this the left ICA remains patent, but is somewhat diminutive distal
to the calcified plaque which occupies the first 3.5 cm of the
vessel. Mild tortuosity of the left ICA just below the skullbase.

Vertebral arteries:

No proximal right subclavian artery stenosis. Calcified plaque at
the right vertebral artery origin. With up to moderate to severe
stenosis, but the right vertebral remains patent. The cervical right
vertebral artery otherwise is negative.

Calcified plaque at the left subclavian artery origin and affecting
the proximal 2 cm of the vessel with up to 75 % stenosis with
respect to the distal vessel. Normal left vertebral artery origin,
although there is V1 segment calcified plaque which does not appear
hemodynamically significant. Intermittent calcified left V2 segment
plaque without stenosis.

CTA HEAD

Posterior circulation: Right vertebral artery V3 segment calcified
plaque with up to 50% stenosis. Bilateral V4 segment calcified
plaque with up to moderate stenosis on the right. The distal right
vertebral artery also is diminutive beyond the patent right PICA
origin. The left PICA origin is normal. Patent vertebrobasilar
junction. Irregular basilar artery without stenosis. SCA and left
PCA origins are normal. Fetal type right PCA origin. Bilateral PCA
branches are within normal limits.

Anterior circulation: Both ICA siphons are patent with moderate to
severe calcified plaque. Still, there is mild to moderate stenosis
of the distal left cavernous segment and right ICA anterior genu.
Normal ophthalmic and posterior communicating artery origins. Patent
carotid termini. Normal MCA origins.

Diminutive bilateral ACA A1 segments where as the anterior
communicating artery and proximal A2 segments appear somewhat larger
and ectatic. Still there is no discrete saccular aneurysm
associated. The mid right ACA A2 segment and appears moderately
irregular and stenotic. No major ACA branch occlusion identified.

Left MCA M1 segment, bifurcation, and left MCA branches are within
normal limits. Right MCA M1 segment, bifurcation, and right MCA
branches are within normal limits.

Venous sinuses: Patent.

Anatomic variants: None.

Delayed phase: No abnormal enhancement identified.
IMPRESSION: 1. Bulky and confluent calcification at the left carotid bifurcation
and involving the proximal 3.5 cm of the left ICA with tandem
RADIOGRAPHIC STRING SIGN stenosis.
Despite this the left ICA remains patent. There is downstream mild
to moderate cavernous segment stenosis related to extensive ICA
siphon calcified plaque.
2. Lesser right carotid calcified plaque with mild to moderate right
siphon anterior genu stenosis.
3. Moderate to severe bilateral ACA A1 and right A2 segment
stenoses.
4. Posterior circulation calcified plaque. High-grade (75%) proximal
left subclavian artery stenosis. No significant left vertebral
artery stenosis. Moderate or severe right vertebral artery origin
and right V4 segment stenosis.
5.  Stable CT appearance of the brain.

## 2017-07-14 ENCOUNTER — Emergency Department (HOSPITAL_COMMUNITY): Payer: Medicare Other

## 2017-07-14 ENCOUNTER — Inpatient Hospital Stay (HOSPITAL_COMMUNITY)
Admission: EM | Disposition: A | Discharge status: Home Health | Payer: Self-pay | Source: Home / Self Care | Attending: Cardiology

## 2017-07-14 ENCOUNTER — Inpatient Hospital Stay (HOSPITAL_COMMUNITY): Payer: Medicare Other

## 2017-07-14 ENCOUNTER — Inpatient Hospital Stay (HOSPITAL_COMMUNITY)
Admission: EM | Admit: 2017-07-14 | Discharge: 2017-08-09 | DRG: 250 | Disposition: A | Discharge status: Home Health | Payer: Medicare Other | Attending: Cardiovascular Disease | Admitting: Cardiovascular Disease

## 2017-07-14 ENCOUNTER — Encounter (HOSPITAL_COMMUNITY): Payer: Self-pay

## 2017-07-14 ENCOUNTER — Other Ambulatory Visit: Payer: Self-pay

## 2017-07-14 DIAGNOSIS — G9340 Encephalopathy, unspecified: Secondary | ICD-10-CM | POA: Diagnosis not present

## 2017-07-14 DIAGNOSIS — I5032 Chronic diastolic (congestive) heart failure: Secondary | ICD-10-CM | POA: Diagnosis not present

## 2017-07-14 DIAGNOSIS — R339 Retention of urine, unspecified: Secondary | ICD-10-CM | POA: Diagnosis not present

## 2017-07-14 DIAGNOSIS — Z87891 Personal history of nicotine dependence: Secondary | ICD-10-CM

## 2017-07-14 DIAGNOSIS — I213 ST elevation (STEMI) myocardial infarction of unspecified site: Secondary | ICD-10-CM | POA: Diagnosis not present

## 2017-07-14 DIAGNOSIS — N141 Nephropathy induced by other drugs, medicaments and biological substances: Secondary | ICD-10-CM | POA: Diagnosis present

## 2017-07-14 DIAGNOSIS — G894 Chronic pain syndrome: Secondary | ICD-10-CM | POA: Diagnosis present

## 2017-07-14 DIAGNOSIS — R29898 Other symptoms and signs involving the musculoskeletal system: Secondary | ICD-10-CM | POA: Diagnosis not present

## 2017-07-14 DIAGNOSIS — N183 Chronic kidney disease, stage 3 unspecified: Secondary | ICD-10-CM

## 2017-07-14 DIAGNOSIS — I480 Paroxysmal atrial fibrillation: Secondary | ICD-10-CM

## 2017-07-14 DIAGNOSIS — I482 Chronic atrial fibrillation: Secondary | ICD-10-CM | POA: Diagnosis not present

## 2017-07-14 DIAGNOSIS — R1310 Dysphagia, unspecified: Secondary | ICD-10-CM | POA: Diagnosis not present

## 2017-07-14 DIAGNOSIS — I25110 Atherosclerotic heart disease of native coronary artery with unstable angina pectoris: Secondary | ICD-10-CM

## 2017-07-14 DIAGNOSIS — E875 Hyperkalemia: Secondary | ICD-10-CM | POA: Diagnosis present

## 2017-07-14 DIAGNOSIS — I639 Cerebral infarction, unspecified: Secondary | ICD-10-CM | POA: Diagnosis not present

## 2017-07-14 DIAGNOSIS — E1159 Type 2 diabetes mellitus with other circulatory complications: Secondary | ICD-10-CM | POA: Diagnosis present

## 2017-07-14 DIAGNOSIS — R29705 NIHSS score 5: Secondary | ICD-10-CM | POA: Diagnosis not present

## 2017-07-14 DIAGNOSIS — I481 Persistent atrial fibrillation: Secondary | ICD-10-CM | POA: Diagnosis present

## 2017-07-14 DIAGNOSIS — I2101 ST elevation (STEMI) myocardial infarction involving left main coronary artery: Secondary | ICD-10-CM | POA: Diagnosis not present

## 2017-07-14 DIAGNOSIS — D631 Anemia in chronic kidney disease: Secondary | ICD-10-CM | POA: Diagnosis present

## 2017-07-14 DIAGNOSIS — R0789 Other chest pain: Secondary | ICD-10-CM | POA: Diagnosis not present

## 2017-07-14 DIAGNOSIS — I5043 Acute on chronic combined systolic (congestive) and diastolic (congestive) heart failure: Secondary | ICD-10-CM | POA: Diagnosis present

## 2017-07-14 DIAGNOSIS — I130 Hypertensive heart and chronic kidney disease with heart failure and stage 1 through stage 4 chronic kidney disease, or unspecified chronic kidney disease: Secondary | ICD-10-CM | POA: Diagnosis present

## 2017-07-14 DIAGNOSIS — R071 Chest pain on breathing: Secondary | ICD-10-CM | POA: Diagnosis not present

## 2017-07-14 DIAGNOSIS — Z79899 Other long term (current) drug therapy: Secondary | ICD-10-CM

## 2017-07-14 DIAGNOSIS — N2889 Other specified disorders of kidney and ureter: Secondary | ICD-10-CM

## 2017-07-14 DIAGNOSIS — E1122 Type 2 diabetes mellitus with diabetic chronic kidney disease: Secondary | ICD-10-CM | POA: Diagnosis not present

## 2017-07-14 DIAGNOSIS — I10 Essential (primary) hypertension: Secondary | ICD-10-CM | POA: Diagnosis not present

## 2017-07-14 DIAGNOSIS — R0602 Shortness of breath: Secondary | ICD-10-CM

## 2017-07-14 DIAGNOSIS — E118 Type 2 diabetes mellitus with unspecified complications: Secondary | ICD-10-CM

## 2017-07-14 DIAGNOSIS — E11649 Type 2 diabetes mellitus with hypoglycemia without coma: Secondary | ICD-10-CM | POA: Diagnosis present

## 2017-07-14 DIAGNOSIS — Z794 Long term (current) use of insulin: Secondary | ICD-10-CM | POA: Diagnosis not present

## 2017-07-14 DIAGNOSIS — Z6838 Body mass index (BMI) 38.0-38.9, adult: Secondary | ICD-10-CM

## 2017-07-14 DIAGNOSIS — I4891 Unspecified atrial fibrillation: Secondary | ICD-10-CM

## 2017-07-14 DIAGNOSIS — I6522 Occlusion and stenosis of left carotid artery: Secondary | ICD-10-CM | POA: Diagnosis not present

## 2017-07-14 DIAGNOSIS — K5900 Constipation, unspecified: Secondary | ICD-10-CM | POA: Diagnosis not present

## 2017-07-14 DIAGNOSIS — E1129 Type 2 diabetes mellitus with other diabetic kidney complication: Secondary | ICD-10-CM | POA: Diagnosis not present

## 2017-07-14 DIAGNOSIS — I6381 Other cerebral infarction due to occlusion or stenosis of small artery: Secondary | ICD-10-CM | POA: Diagnosis not present

## 2017-07-14 DIAGNOSIS — R0781 Pleurodynia: Secondary | ICD-10-CM

## 2017-07-14 DIAGNOSIS — I5023 Acute on chronic systolic (congestive) heart failure: Secondary | ICD-10-CM | POA: Diagnosis not present

## 2017-07-14 DIAGNOSIS — R531 Weakness: Secondary | ICD-10-CM | POA: Diagnosis not present

## 2017-07-14 DIAGNOSIS — N179 Acute kidney failure, unspecified: Secondary | ICD-10-CM | POA: Diagnosis not present

## 2017-07-14 DIAGNOSIS — R251 Tremor, unspecified: Secondary | ICD-10-CM | POA: Diagnosis not present

## 2017-07-14 DIAGNOSIS — E119 Type 2 diabetes mellitus without complications: Secondary | ICD-10-CM

## 2017-07-14 DIAGNOSIS — G4733 Obstructive sleep apnea (adult) (pediatric): Secondary | ICD-10-CM | POA: Diagnosis present

## 2017-07-14 DIAGNOSIS — J9610 Chronic respiratory failure, unspecified whether with hypoxia or hypercapnia: Secondary | ICD-10-CM | POA: Diagnosis present

## 2017-07-14 DIAGNOSIS — F1120 Opioid dependence, uncomplicated: Secondary | ICD-10-CM | POA: Diagnosis present

## 2017-07-14 DIAGNOSIS — I951 Orthostatic hypotension: Secondary | ICD-10-CM | POA: Diagnosis not present

## 2017-07-14 DIAGNOSIS — J449 Chronic obstructive pulmonary disease, unspecified: Secondary | ICD-10-CM | POA: Diagnosis not present

## 2017-07-14 DIAGNOSIS — I4901 Ventricular fibrillation: Secondary | ICD-10-CM | POA: Diagnosis not present

## 2017-07-14 DIAGNOSIS — R918 Other nonspecific abnormal finding of lung field: Secondary | ICD-10-CM | POA: Diagnosis not present

## 2017-07-14 DIAGNOSIS — R809 Proteinuria, unspecified: Secondary | ICD-10-CM | POA: Diagnosis present

## 2017-07-14 DIAGNOSIS — E785 Hyperlipidemia, unspecified: Secondary | ICD-10-CM | POA: Diagnosis present

## 2017-07-14 DIAGNOSIS — F329 Major depressive disorder, single episode, unspecified: Secondary | ICD-10-CM | POA: Diagnosis present

## 2017-07-14 DIAGNOSIS — Z8249 Family history of ischemic heart disease and other diseases of the circulatory system: Secondary | ICD-10-CM

## 2017-07-14 DIAGNOSIS — R0600 Dyspnea, unspecified: Secondary | ICD-10-CM

## 2017-07-14 DIAGNOSIS — E1165 Type 2 diabetes mellitus with hyperglycemia: Secondary | ICD-10-CM | POA: Diagnosis present

## 2017-07-14 DIAGNOSIS — Z8673 Personal history of transient ischemic attack (TIA), and cerebral infarction without residual deficits: Secondary | ICD-10-CM

## 2017-07-14 DIAGNOSIS — F419 Anxiety disorder, unspecified: Secondary | ICD-10-CM | POA: Diagnosis present

## 2017-07-14 DIAGNOSIS — E871 Hypo-osmolality and hyponatremia: Secondary | ICD-10-CM

## 2017-07-14 DIAGNOSIS — I2102 ST elevation (STEMI) myocardial infarction involving left anterior descending coronary artery: Principal | ICD-10-CM

## 2017-07-14 DIAGNOSIS — Z7902 Long term (current) use of antithrombotics/antiplatelets: Secondary | ICD-10-CM

## 2017-07-14 DIAGNOSIS — E8770 Fluid overload, unspecified: Secondary | ICD-10-CM | POA: Diagnosis not present

## 2017-07-14 DIAGNOSIS — I872 Venous insufficiency (chronic) (peripheral): Secondary | ICD-10-CM | POA: Diagnosis present

## 2017-07-14 DIAGNOSIS — R079 Chest pain, unspecified: Secondary | ICD-10-CM | POA: Diagnosis not present

## 2017-07-14 DIAGNOSIS — E1142 Type 2 diabetes mellitus with diabetic polyneuropathy: Secondary | ICD-10-CM | POA: Diagnosis present

## 2017-07-14 DIAGNOSIS — R34 Anuria and oliguria: Secondary | ICD-10-CM | POA: Diagnosis present

## 2017-07-14 DIAGNOSIS — I152 Hypertension secondary to endocrine disorders: Secondary | ICD-10-CM | POA: Diagnosis present

## 2017-07-14 DIAGNOSIS — R600 Localized edema: Secondary | ICD-10-CM | POA: Diagnosis not present

## 2017-07-14 DIAGNOSIS — I252 Old myocardial infarction: Secondary | ICD-10-CM

## 2017-07-14 DIAGNOSIS — Z888 Allergy status to other drugs, medicaments and biological substances status: Secondary | ICD-10-CM

## 2017-07-14 DIAGNOSIS — K5909 Other constipation: Secondary | ICD-10-CM | POA: Diagnosis present

## 2017-07-14 DIAGNOSIS — Z9981 Dependence on supplemental oxygen: Secondary | ICD-10-CM

## 2017-07-14 DIAGNOSIS — Z7982 Long term (current) use of aspirin: Secondary | ICD-10-CM

## 2017-07-14 DIAGNOSIS — R001 Bradycardia, unspecified: Secondary | ICD-10-CM | POA: Diagnosis not present

## 2017-07-14 DIAGNOSIS — T508X5A Adverse effect of diagnostic agents, initial encounter: Secondary | ICD-10-CM | POA: Diagnosis present

## 2017-07-14 DIAGNOSIS — R5381 Other malaise: Secondary | ICD-10-CM | POA: Diagnosis not present

## 2017-07-14 DIAGNOSIS — T502X5A Adverse effect of carbonic-anhydrase inhibitors, benzothiadiazides and other diuretics, initial encounter: Secondary | ICD-10-CM | POA: Diagnosis not present

## 2017-07-14 DIAGNOSIS — G92 Toxic encephalopathy: Secondary | ICD-10-CM

## 2017-07-14 DIAGNOSIS — R0603 Acute respiratory distress: Secondary | ICD-10-CM | POA: Diagnosis not present

## 2017-07-14 DIAGNOSIS — I5033 Acute on chronic diastolic (congestive) heart failure: Secondary | ICD-10-CM | POA: Diagnosis not present

## 2017-07-14 DIAGNOSIS — I255 Ischemic cardiomyopathy: Secondary | ICD-10-CM | POA: Diagnosis not present

## 2017-07-14 HISTORY — PX: LEFT HEART CATH AND CORONARY ANGIOGRAPHY: CATH118249

## 2017-07-14 HISTORY — PX: INTRAVASCULAR ULTRASOUND/IVUS: CATH118244

## 2017-07-14 HISTORY — PX: CORONARY BALLOON ANGIOPLASTY: CATH118233

## 2017-07-14 LAB — I-STAT CHEM 8, ED
BUN: 36 mg/dL — ABNORMAL HIGH (ref 6–20)
Calcium, Ion: 0.95 mmol/L — ABNORMAL LOW (ref 1.15–1.40)
Chloride: 94 mmol/L — ABNORMAL LOW (ref 101–111)
Creatinine, Ser: 1.5 mg/dL — ABNORMAL HIGH (ref 0.61–1.24)
Glucose, Bld: 219 mg/dL — ABNORMAL HIGH (ref 65–99)
HCT: 37 % — ABNORMAL LOW (ref 39.0–52.0)
Hemoglobin: 12.6 g/dL — ABNORMAL LOW (ref 13.0–17.0)
Potassium: 4.6 mmol/L (ref 3.5–5.1)
Sodium: 130 mmol/L — ABNORMAL LOW (ref 135–145)
TCO2: 29 mmol/L (ref 22–32)

## 2017-07-14 LAB — CBC
HEMATOCRIT: 37.3 % — AB (ref 39.0–52.0)
HEMOGLOBIN: 12.3 g/dL — AB (ref 13.0–17.0)
MCH: 29.1 pg (ref 26.0–34.0)
MCHC: 33 g/dL (ref 30.0–36.0)
MCV: 88.4 fL (ref 78.0–100.0)
Platelets: 168 10*3/uL (ref 150–400)
RBC: 4.22 MIL/uL (ref 4.22–5.81)
RDW: 15 % (ref 11.5–15.5)
WBC: 13.1 10*3/uL — AB (ref 4.0–10.5)

## 2017-07-14 LAB — GLUCOSE, CAPILLARY
GLUCOSE-CAPILLARY: 204 mg/dL — AB (ref 65–99)
GLUCOSE-CAPILLARY: 239 mg/dL — AB (ref 65–99)
GLUCOSE-CAPILLARY: 244 mg/dL — AB (ref 65–99)
GLUCOSE-CAPILLARY: 205 mg/dL — AB (ref 65–99)

## 2017-07-14 LAB — TROPONIN I
TROPONIN I: 20.79 ng/mL — AB (ref <0.03)
TROPONIN I: 16.59 ng/mL — AB (ref <0.03)
TROPONIN I: 17.03 ng/mL — AB (ref <0.03)

## 2017-07-14 LAB — BASIC METABOLIC PANEL
ANION GAP: 11 (ref 5–15)
BUN: 29 mg/dL — ABNORMAL HIGH (ref 6–20)
CALCIUM: 8.6 mg/dL — AB (ref 8.9–10.3)
CO2: 28 mmol/L (ref 22–32)
Chloride: 91 mmol/L — ABNORMAL LOW (ref 101–111)
Creatinine, Ser: 1.5 mg/dL — ABNORMAL HIGH (ref 0.61–1.24)
GFR, EST AFRICAN AMERICAN: 54 mL/min — AB (ref >60)
GFR, EST NON AFRICAN AMERICAN: 47 mL/min — AB (ref >60)
Glucose, Bld: 234 mg/dL — ABNORMAL HIGH (ref 65–99)
POTASSIUM: 4.1 mmol/L (ref 3.5–5.1)
SODIUM: 130 mmol/L — AB (ref 135–145)

## 2017-07-14 LAB — I-STAT TROPONIN, ED: TROPONIN I, POC: 18.1 ng/mL — AB (ref 0.00–0.08)

## 2017-07-14 LAB — POCT I-STAT 3, ART BLOOD GAS (G3+)
Acid-Base Excess: 3 mmol/L — ABNORMAL HIGH (ref 0.0–2.0)
Bicarbonate: 26.3 mmol/L (ref 20.0–28.0)
O2 SAT: 98 %
PCO2 ART: 36 mmHg (ref 32.0–48.0)
PO2 ART: 100 mmHg (ref 83.0–108.0)
TCO2: 27 mmol/L (ref 22–32)
pH, Arterial: 7.472 — ABNORMAL HIGH (ref 7.350–7.450)

## 2017-07-14 LAB — POCT ACTIVATED CLOTTING TIME: Activated Clotting Time: 346 seconds

## 2017-07-14 LAB — TSH: TSH: 1.615 u[IU]/mL (ref 0.350–4.500)

## 2017-07-14 LAB — ECHOCARDIOGRAM COMPLETE
HEIGHTINCHES: 73 in
WEIGHTICAEL: 4720 [oz_av]

## 2017-07-14 LAB — MRSA PCR SCREENING: MRSA by PCR: NEGATIVE

## 2017-07-14 SURGERY — LEFT HEART CATH AND CORONARY ANGIOGRAPHY
Anesthesia: LOCAL

## 2017-07-14 MED ORDER — IOPAMIDOL (ISOVUE-370) INJECTION 76%
INTRAVENOUS | Status: DC | PRN
  Administered 2017-07-14: 185 mL via INTRA_ARTERIAL

## 2017-07-14 MED ORDER — SODIUM CHLORIDE 0.9 % IV SOLN: 250.0000 mL | INTRAVENOUS | Status: DC | PRN

## 2017-07-14 MED ORDER — HEPARIN (PORCINE) IN NACL 2-0.9 UNIT/ML-% IJ SOLN
INTRAMUSCULAR | Status: AC
  Filled 2017-07-14: qty 1000

## 2017-07-14 MED ORDER — NITROGLYCERIN IN D5W 200-5 MCG/ML-% IV SOLN
0.0000 ug/min | Freq: Once | INTRAVENOUS | Status: AC
  Administered 2017-07-14: 5 ug/min via INTRAVENOUS
  Filled 2017-07-14: qty 250

## 2017-07-14 MED ORDER — DILTIAZEM HCL-DEXTROSE 100-5 MG/100ML-% IV SOLN (PREMIX): 5.0000 mg/h | INTRAVENOUS | Status: DC

## 2017-07-14 MED ORDER — VERAPAMIL HCL 2.5 MG/ML IV SOLN
INTRAVENOUS | Status: AC
  Filled 2017-07-14: qty 2

## 2017-07-14 MED ORDER — PERFLUTREN LIPID MICROSPHERE
1.0000 mL | INTRAVENOUS | Status: AC | PRN
  Administered 2017-07-14: 4 mL via INTRAVENOUS
  Filled 2017-07-14 (×2): qty 10

## 2017-07-14 MED ORDER — ACETAMINOPHEN 325 MG PO TABS
650.0000 mg | ORAL_TABLET | ORAL | Status: DC | PRN
  Administered 2017-07-31: 650 mg via ORAL
  Filled 2017-07-14: qty 2

## 2017-07-14 MED ORDER — AMIODARONE HCL IN DEXTROSE 360-4.14 MG/200ML-% IV SOLN
30.0000 mg/h | INTRAVENOUS | Status: DC
  Administered 2017-07-15 – 2017-07-16 (×3): 30 mg/h via INTRAVENOUS
  Filled 2017-07-14 (×3): qty 200

## 2017-07-14 MED ORDER — IOPAMIDOL (ISOVUE-370) INJECTION 76%
INTRAVENOUS | Status: AC
  Filled 2017-07-14: qty 125

## 2017-07-14 MED ORDER — MORPHINE SULFATE (PF) 4 MG/ML IV SOLN
INTRAVENOUS | Status: AC
  Filled 2017-07-14: qty 1

## 2017-07-14 MED ORDER — ATORVASTATIN CALCIUM 40 MG PO TABS
40.0000 mg | ORAL_TABLET | Freq: Every day | ORAL | Status: DC
  Administered 2017-07-14 – 2017-07-23 (×10): 40 mg via ORAL
  Filled 2017-07-14 (×10): qty 1

## 2017-07-14 MED ORDER — ASPIRIN EC 81 MG PO TBEC
81.0000 mg | DELAYED_RELEASE_TABLET | Freq: Every day | ORAL | Status: DC
  Administered 2017-07-15 – 2017-08-01 (×17): 81 mg via ORAL
  Filled 2017-07-14 (×17): qty 1

## 2017-07-14 MED ORDER — DILTIAZEM LOAD VIA INFUSION
10.0000 mg | Freq: Once | INTRAVENOUS | Status: AC
  Administered 2017-07-14: 09:00:00 via INTRAVENOUS
  Filled 2017-07-14: qty 10

## 2017-07-14 MED ORDER — HEPARIN BOLUS VIA INFUSION
4000.0000 [IU] | Freq: Once | INTRAVENOUS | Status: AC
  Administered 2017-07-14: 4000 [IU] via INTRAVENOUS

## 2017-07-14 MED ORDER — AMIODARONE LOAD VIA INFUSION
150.0000 mg | Freq: Once | INTRAVENOUS | Status: AC
  Administered 2017-07-14: 150 mg via INTRAVENOUS
  Filled 2017-07-14: qty 83.34

## 2017-07-14 MED ORDER — INSULIN GLARGINE 100 UNIT/ML ~~LOC~~ SOLN: 30.0000 [IU] | SUBCUTANEOUS | Status: DC

## 2017-07-14 MED ORDER — INSULIN GLARGINE 100 UNIT/ML ~~LOC~~ SOLN
50.0000 [IU] | Freq: Every day | SUBCUTANEOUS | Status: DC
  Administered 2017-07-15: 50 [IU] via SUBCUTANEOUS
  Filled 2017-07-14 (×2): qty 0.5

## 2017-07-14 MED ORDER — ONDANSETRON HCL 4 MG/2ML IJ SOLN: 4.0000 mg | Freq: Four times a day (QID) | INTRAMUSCULAR | Status: DC | PRN

## 2017-07-14 MED ORDER — OMEGA-3-ACID ETHYL ESTERS 1 G PO CAPS
3.0000 g | ORAL_CAPSULE | Freq: Two times a day (BID) | ORAL | Status: DC
  Administered 2017-07-15 – 2017-08-09 (×47): 3 g via ORAL
  Filled 2017-07-14 (×46): qty 3

## 2017-07-14 MED ORDER — CHLORHEXIDINE GLUCONATE 0.12 % MT SOLN
OROMUCOSAL | Status: AC
  Administered 2017-07-14: 15 mL
  Filled 2017-07-14: qty 15

## 2017-07-14 MED ORDER — DILTIAZEM LOAD VIA INFUSION: 10.0000 mg | Freq: Once | INTRAVENOUS | Status: DC

## 2017-07-14 MED ORDER — ORAL CARE MOUTH RINSE
15.0000 mL | Freq: Two times a day (BID) | OROMUCOSAL | Status: DC
  Administered 2017-07-14 – 2017-07-15 (×2): 15 mL via OROMUCOSAL

## 2017-07-14 MED ORDER — DILTIAZEM HCL-DEXTROSE 100-5 MG/100ML-% IV SOLN (PREMIX)
INTRAVENOUS | Status: AC
  Filled 2017-07-14: qty 100

## 2017-07-14 MED ORDER — NITROGLYCERIN 0.4 MG SL SUBL
0.4000 mg | SUBLINGUAL_TABLET | SUBLINGUAL | Status: DC | PRN
  Filled 2017-07-14 (×2): qty 1

## 2017-07-14 MED ORDER — CLOPIDOGREL BISULFATE 300 MG PO TABS
ORAL_TABLET | ORAL | Status: DC | PRN
  Administered 2017-07-14: 150 mg via ORAL

## 2017-07-14 MED ORDER — SODIUM CHLORIDE 0.9% FLUSH: 3.0000 mL | INTRAVENOUS | Status: DC | PRN

## 2017-07-14 MED ORDER — AMIODARONE HCL IN DEXTROSE 360-4.14 MG/200ML-% IV SOLN
60.0000 mg/h | INTRAVENOUS | Status: AC
  Administered 2017-07-14 (×2): 60 mg/h via INTRAVENOUS
  Filled 2017-07-14 (×2): qty 200

## 2017-07-14 MED ORDER — HEPARIN SODIUM (PORCINE) 1000 UNIT/ML IJ SOLN: 4000.0000 [IU] | Freq: Once | INTRAMUSCULAR | Status: DC

## 2017-07-14 MED ORDER — LIDOCAINE HCL (PF) 1 % IJ SOLN
INTRAMUSCULAR | Status: AC
  Filled 2017-07-14: qty 30

## 2017-07-14 MED ORDER — HEPARIN (PORCINE) IN NACL 2-0.9 UNIT/ML-% IJ SOLN
INTRAMUSCULAR | Status: AC | PRN
  Administered 2017-07-14: 1000 mL

## 2017-07-14 MED ORDER — HEPARIN SODIUM (PORCINE) 1000 UNIT/ML IJ SOLN
INTRAMUSCULAR | Status: DC | PRN
  Administered 2017-07-14: 7000 [IU] via INTRAVENOUS

## 2017-07-14 MED ORDER — TIOTROPIUM BROMIDE MONOHYDRATE 18 MCG IN CAPS
18.0000 ug | ORAL_CAPSULE | Freq: Every day | RESPIRATORY_TRACT | Status: DC
  Administered 2017-07-14 – 2017-08-08 (×24): 18 ug via RESPIRATORY_TRACT
  Filled 2017-07-14 (×5): qty 5

## 2017-07-14 MED ORDER — HEPARIN (PORCINE) IN NACL 100-0.45 UNIT/ML-% IJ SOLN
2500.0000 [IU]/h | INTRAMUSCULAR | Status: DC
  Administered 2017-07-14: 1400 [IU]/h via INTRAVENOUS
  Administered 2017-07-15: 1800 [IU]/h via INTRAVENOUS
  Administered 2017-07-15: 2200 [IU]/h via INTRAVENOUS
  Administered 2017-07-16 – 2017-07-19 (×7): 2500 [IU]/h via INTRAVENOUS
  Filled 2017-07-14 (×11): qty 250

## 2017-07-14 MED ORDER — SODIUM CHLORIDE 0.9% FLUSH
3.0000 mL | Freq: Two times a day (BID) | INTRAVENOUS | Status: DC
  Administered 2017-07-14 – 2017-07-18 (×9): 3 mL via INTRAVENOUS

## 2017-07-14 MED ORDER — FERROUS SULFATE 325 (65 FE) MG PO TABS
325.0000 mg | ORAL_TABLET | Freq: Three times a day (TID) | ORAL | Status: DC
  Administered 2017-07-14 – 2017-07-21 (×22): 325 mg via ORAL
  Filled 2017-07-14 (×22): qty 1

## 2017-07-14 MED ORDER — HEPARIN SODIUM (PORCINE) 1000 UNIT/ML IJ SOLN
INTRAMUSCULAR | Status: AC
  Filled 2017-07-14: qty 1

## 2017-07-14 MED ORDER — MAGNESIUM OXIDE 400 (241.3 MG) MG PO TABS
400.0000 mg | ORAL_TABLET | Freq: Every day | ORAL | Status: DC
  Administered 2017-07-15 – 2017-08-09 (×25): 400 mg via ORAL
  Filled 2017-07-14 (×25): qty 1

## 2017-07-14 MED ORDER — METOPROLOL TARTRATE 12.5 MG HALF TABLET
12.5000 mg | ORAL_TABLET | Freq: Two times a day (BID) | ORAL | Status: DC
  Administered 2017-07-14 – 2017-07-15 (×2): 12.5 mg via ORAL
  Filled 2017-07-14 (×2): qty 1

## 2017-07-14 MED ORDER — NITROGLYCERIN IN D5W 200-5 MCG/ML-% IV SOLN
0.0000 ug/min | INTRAVENOUS | Status: DC
  Administered 2017-07-14: 10 ug/min via INTRAVENOUS

## 2017-07-14 MED ORDER — HEPARIN (PORCINE) IN NACL 100-0.45 UNIT/ML-% IJ SOLN
1000.0000 [IU]/h | Freq: Once | INTRAMUSCULAR | Status: AC
  Administered 2017-07-14: 1000 [IU]/h via INTRAVENOUS
  Filled 2017-07-14: qty 250

## 2017-07-14 MED ORDER — IOPAMIDOL (ISOVUE-370) INJECTION 76%
INTRAVENOUS | Status: AC
  Filled 2017-07-14: qty 100

## 2017-07-14 MED ORDER — METOPROLOL TARTRATE 5 MG/5ML IV SOLN
5.0000 mg | Freq: Four times a day (QID) | INTRAVENOUS | Status: DC
  Administered 2017-07-14 – 2017-07-15 (×2): 5 mg via INTRAVENOUS
  Filled 2017-07-14 (×2): qty 5

## 2017-07-14 MED ORDER — MORPHINE SULFATE (PF) 4 MG/ML IV SOLN
4.0000 mg | Freq: Once | INTRAVENOUS | Status: AC
  Administered 2017-07-14: 4 mg via INTRAVENOUS
  Filled 2017-07-14: qty 1

## 2017-07-14 MED ORDER — INFLUENZA VAC SPLIT HIGH-DOSE 0.5 ML IM SUSY
0.5000 mL | PREFILLED_SYRINGE | INTRAMUSCULAR | Status: DC
  Filled 2017-07-14 (×2): qty 0.5

## 2017-07-14 MED ORDER — NITROGLYCERIN 1 MG/10 ML FOR IR/CATH LAB
INTRA_ARTERIAL | Status: AC
  Filled 2017-07-14: qty 10

## 2017-07-14 MED ORDER — ONDANSETRON HCL 4 MG/2ML IJ SOLN
INTRAMUSCULAR | Status: DC | PRN
  Administered 2017-07-14: 4 mg via INTRAVENOUS

## 2017-07-14 MED ORDER — CLOPIDOGREL BISULFATE 75 MG PO TABS
75.0000 mg | ORAL_TABLET | Freq: Every day | ORAL | Status: DC
  Administered 2017-07-15 – 2017-08-09 (×26): 75 mg via ORAL
  Filled 2017-07-14 (×26): qty 1

## 2017-07-14 MED ORDER — INSULIN ASPART 100 UNIT/ML ~~LOC~~ SOLN
30.0000 [IU] | Freq: Three times a day (TID) | SUBCUTANEOUS | Status: DC
  Administered 2017-07-14 – 2017-07-15 (×4): 30 [IU] via SUBCUTANEOUS

## 2017-07-14 MED ORDER — INSULIN GLARGINE 100 UNIT/ML ~~LOC~~ SOLN
60.0000 [IU] | Freq: Every day | SUBCUTANEOUS | Status: DC
  Administered 2017-07-14: 60 [IU] via SUBCUTANEOUS
  Administered 2017-07-15: 30 [IU] via SUBCUTANEOUS
  Filled 2017-07-14 (×2): qty 0.6

## 2017-07-14 MED ORDER — MORPHINE SULFATE (PF) 4 MG/ML IV SOLN
2.0000 mg | INTRAVENOUS | Status: DC | PRN
  Administered 2017-07-14 – 2017-07-29 (×5): 2 mg via INTRAVENOUS
  Filled 2017-07-14 (×4): qty 1

## 2017-07-14 MED ORDER — VERAPAMIL HCL 2.5 MG/ML IV SOLN
INTRAVENOUS | Status: DC | PRN
  Administered 2017-07-14: 10 mL via INTRA_ARTERIAL

## 2017-07-14 MED ORDER — OMEGA-3-ACID ETHYL ESTERS 1 G PO CAPS
2.0000 g | ORAL_CAPSULE | Freq: Every day | ORAL | Status: DC
  Administered 2017-07-15 – 2017-08-09 (×26): 2 g via ORAL
  Filled 2017-07-14 (×26): qty 2

## 2017-07-14 MED ORDER — SODIUM CHLORIDE 0.9 % WEIGHT BASED INFUSION
1.0000 mL/kg/h | INTRAVENOUS | Status: AC
  Administered 2017-07-14: 1 mL/kg/h via INTRAVENOUS

## 2017-07-14 MED ORDER — DILTIAZEM HCL-DEXTROSE 100-5 MG/100ML-% IV SOLN (PREMIX)
5.0000 mg/h | INTRAVENOUS | Status: DC
  Administered 2017-07-14: 15 mg/h via INTRAVENOUS
  Administered 2017-07-14: 5 mg/h via INTRAVENOUS
  Filled 2017-07-14 (×2): qty 100

## 2017-07-14 MED ORDER — SERTRALINE HCL 100 MG PO TABS
100.0000 mg | ORAL_TABLET | Freq: Every day | ORAL | Status: DC
  Administered 2017-07-14 – 2017-08-09 (×26): 100 mg via ORAL
  Filled 2017-07-14 (×26): qty 1

## 2017-07-14 SURGICAL SUPPLY — 17 items
BALLN EUPHORA RX 2.25X12 (BALLOONS) ×2
BALLOON EUPHORA RX 2.25X12 (BALLOONS) IMPLANT
CATH INFINITI JR4 5F (CATHETERS) ×1 IMPLANT
CATH OPTICROSS 40MHZ (CATHETERS) ×1 IMPLANT
CATH VISTA GUIDE 6FR XBLAD3.5 (CATHETERS) ×1 IMPLANT
DEVICE RAD COMP TR BAND LRG (VASCULAR PRODUCTS) ×1 IMPLANT
GLIDESHEATH SLEND SS 6F .021 (SHEATH) ×1 IMPLANT
GUIDEWIRE INQWIRE 1.5J.035X260 (WIRE) IMPLANT
INQWIRE 1.5J .035X260CM (WIRE) ×2
KIT ENCORE 26 ADVANTAGE (KITS) ×1 IMPLANT
KIT HEART LEFT (KITS) ×2 IMPLANT
PACK CARDIAC CATHETERIZATION (CUSTOM PROCEDURE TRAY) ×2 IMPLANT
SLED PULL BACK IVUS (MISCELLANEOUS) ×1 IMPLANT
TRANSDUCER W/STOPCOCK (MISCELLANEOUS) ×2 IMPLANT
TUBING CIL FLEX 10 FLL-RA (TUBING) ×2 IMPLANT
WIRE ASAHI PROWATER 180CM (WIRE) ×1 IMPLANT
WIRE PT2 MS 185 (WIRE) ×1 IMPLANT

## 2017-07-14 NOTE — ED Notes (Signed)
Critical troponin of 20.79. Dr Thurnell Garbe notified and called to cath lab

## 2017-07-14 NOTE — Progress Notes (Signed)
    Came to assess him at bedside because of increasing lethargy.  Cardiac catheterization took place earlier today and he did receive a total of 6 mg of morphine post catheterization for ongoing chest pain.  Last dose was over 4 hours ago.  As of note, earlier today at around noon a bottle of oxycodone was found in his pocket, see nursing note.  In questioning him, at first he did say that he was having some chest discomfort and then when questioning him again he denied chest discomfort.  He seems to be fairly lethargic but easily arousable.  He thought it was 1998 at first.  He knew that Trump was president.  His wife states that even yesterday before he came him he was asking the same question over and over.  Currently his atrial fibrillation is under good rate control with IV diltiazem.  I decided to stop the IV diltiazem and change him over to IV amiodarone and IV metoprolol 5 mg 4 times a day with hold parameters.  His ejection fraction appears to be approximately 40-45% with inferior apical akinesis.  His left ventricular end-diastolic pressure was in the mid 20s.  Blood gas was performed and demonstrated 7.47, 36, 100  Last hemoglobin was 12.6.  Creatinine has been stable at 1.5, sodium has also been stable at 130.  Troponin is now trending downward from 20 down to 16.5.  TSH is 1.6.  Delirium in the setting of acute inferior lateral ST elevation myocardial infarction late presenting, starting approximately 2-3 days ago.  -Reassuring blood gas pattern.  EKG is unchanged.  He does not appear to be labored with respiration.  EF is approximately 40%.  -We will turn off nitroglycerin given his prior low normal blood pressure.  I will also turn off his IV diltiazem given his ischemic cardiomyopathy.  -We will place him on IV amiodarone and IV metoprolol low-dose with hold parameters watching for any signs of worsening hypotension in the setting of he is inferolateral infarct.  Hypotension can  easily occur with these types of MIs.  -Review of cardiac catheterization revealed 70% distal left main into the circumflex.  Chronically occluded right.  There is consideration for cardiothoracic surgery consultation noted.  -In addition, staff did find a bottle of oxycodone in his pocket earlier today.  Wife aware.  Certainly some of his delirium may be an accumulation of extreme fatigue in the setting of 3 days of lack of sleep on top of benzodiazepine/opiate administration.  We will carefully monitor him in the CCU setting.  He moves all extremities.  Low suspicion for stroke.  His wife does note that yesterday he was asking the same question over and over again.  Critical care time 40 minutes spent with patient at bedside, conference with nursing staff, extensive data review in this gentleman with critical illness, inferior lateral STEMI with severe coronary artery disease.  Expressed to the wife and son the gravity of his illness.  Currently full code.  Candee Furbish, MD

## 2017-07-14 NOTE — ED Notes (Signed)
carelink left with pt. Report given to [facility] in Cath lab. Dr Thurnell Garbe aware of troponin 18

## 2017-07-14 NOTE — ED Notes (Signed)
[place] called [facility] spoke with Sarah to activate Code STEMI. Judson Roch stated that Dr. Martinique was on call would page him and call back. Around 0925 Dr. [place] nurse called Dr. Thurnell Garbe for report on pt to say this is a Code STEMI and a few seconds later Jared with [facility] called to get report from [facility] RN for pt to be transported to [facility]. Doug from [place] called back to speak with Dr. Thurnell Garbe twice with two different Cardiologist stating that Code STEMI had not been activated. Saftey portal has been done.

## 2017-07-14 NOTE — Progress Notes (Signed)
Placed pt on home CPAP unit at this time. Pt tolerating well, RT will continue to monitor.

## 2017-07-14 NOTE — Consult Note (Addendum)
[place] Nurse wound consult note Reason for Consult: Consult requested for left heel and left outer foot.  Pt has 2 full thickness wounds which are being treated with Medihoney prior to admission and changed 3 times a week.  Wound type: Outer foot healing full thickness wound; .2X.2X.1cm, dry and yellow, no odor, drainage, or fluctuance. Heel full thickness wound; 2X1.5X.2cm, dry and yellow, no odor, drainage, or fluctuance. Dressing procedure/placement/frequency: Medihoney is not available in the [place]; free sample left at the bedside for patient use and instructions provided. Pt agrees with plan of care. Please re-consult if further assistance is needed.  Thank-you,  Julien Girt MSN, [place], [place], [place], [place]

## 2017-07-14 NOTE — H&P (Signed)
History & Physical    Patient ID: Jose [name] MRN: [id number], DOB/AGE: [birth year]   Admit date: 07/14/2017   Primary Physician: Cleophas Dunker, MD Primary Cardiologist: [place]  Patient Profile    66 yo male with PMH of CAD (CTO of RCA, with residual disease in LAD/Lcx), COPD, HL, HTN, obesity, DM and tobacco use who presented to [facility] with chest pain and dyspnea.   Past Medical History   Past Medical History:  Diagnosis Date  . CAD (coronary artery disease)    a. prior silent inferior MI, NSTEMI 07/2014 with chronically occluded RCA s/p unsuccessful PCI with otherwise nonobstructive residual disease  . Chronic respiratory failure ([place])    a. Placed on home O2 01/2016.  [name] Kitchen COPD (chronic obstructive pulmonary disease) ([place])   . Diabetes mellitus ([place])   . Former tobacco use   . Hyperlipidemia   . Hypertension   . Morbid obesity ([place])   . Neuropathy   . Sleep apnea     Past Surgical History:  Procedure Laterality Date  . BACK SURGERY    . LEFT HEART CATHETERIZATION WITH CORONARY ANGIOGRAM N/A 08/28/2014   Procedure: LEFT HEART CATHETERIZATION WITH CORONARY ANGIOGRAM;  Surgeon: Clent Demark, MD;  Location: [facility] CATH LAB;  Service: Cardiovascular;  Laterality: N/A;  . PERIPHERAL VASCULAR CATHETERIZATION Bilateral 03/22/2016   Procedure: Carotid Angiography;  Surgeon: Elam Dutch, MD;  Location: [facility] CV LAB;  Service: Cardiovascular;  Laterality: Bilateral;     Allergies  Allergies  Allergen Reactions  . Simvastatin     Muscle weakness    History of Present Illness    [name] is a 66 yo male with PMH of  CAD (CTO of RCA, with residual disease in LAD/Lcx), COPD, HL, HTN, obesity, DM and tobacco use. Underwent cardiac cath in 2015 with Dr. Terrence Dupont that showed a CTO of RCA, with residual disease in the LAD, and Lcx. Unsuccessful attempt at the RCA. He has been on DAPt with ASA/plavix since that time. Was last seen in the office by Dr. Harl Bowie on 10/17 and  reported begin in his usual state of health. Last echo 6/17 with normal EF and G1DD.   He presented to [facility] on 07/14[facility]17 with chest pain and dyspnea. Reports this started about 3 days ago, and has be intermittent. He is on chronic home o2 but has been more short of breath with activity. This morning his wife became concerned about his symptoms and called EMS. Was given 324 ASA. Reported to be in Afib on EMS.   On arrival to [facility] his EKG showed new onset Afib with ST elevation in the inferolateral leads. CODE STEMI was called. He was given 4000 units of heparin, IV Dilt 10 x1, then 15mg  x1 and remained on Dilt gtt. Brought directly to the cath lab for emergent cardiac cath. Troponin on arrival 20.79, Cr 1.5, Na+ 130, Hgb 12.6.   Home Medications    Prior to Admission medications   Medication Sig Start Date End Date Taking? Authorizing Provider  aspirin 81 MG tablet Take 81 mg by mouth daily.    [provider]  clopidogrel (PLAVIX) 75 MG tablet Take 1 tablet (75 mg total) by mouth daily. 03/23/16   Donzetta Starch, NP  ergocalciferol (VITAMIN D2) 50000 UNITS capsule Take 50,000 Units by mouth See admin instructions. Take one twice a week on wednesdays and saturdays    [provider]  ferrous sulfate 325 (65 FE) MG tablet Take 325  mg by mouth 3 (three) times daily with meals.    [provider]  furosemide (LASIX) 40 MG tablet Take 40 mg by mouth 2 (two) times daily.    [provider]  glipiZIDE (GLUCOTROL) 5 MG tablet Take 5-10 mg by mouth 4 (four) times daily. 10mg  in the morning, 5mg  in the afternoon, 5mg  in the evening    [provider]  insulin aspart (NOVOLOG) 100 UNIT/ML injection Inject 30 Units into the skin 3 (three) times daily before meals.     [provider]  insulin glargine (LANTUS) 100 UNIT/ML injection Inject 0.3-0.6 mLs (30-60 Units total) into the skin See admin instructions. 50 units in the AM and 60 units in the PM 03/23/16    Burnetta Sabin L, NP  magnesium oxide (MAG-OX) 400 MG tablet Take 1 tablet (400 mg total) by mouth daily. 03/04/16   Arnoldo Lenis, MD  metFORMIN (GLUCOPHAGE) 1000 MG tablet Take 1 tablet (1,000 mg total) by mouth 2 (two) times daily with a meal. 08/30/14   Charolette Forward, MD  metoprolol tartrate (LOPRESSOR) 25 MG tablet Take 0.5 tablets (12.5 mg total) by mouth 2 (two) times daily. 03/23/16   Donzetta Starch, NP  Multiple Vitamins-Minerals (ICAPS AREDS 2 PO) Take 1 capsule by mouth 2 (two) times daily.     [provider]  mupirocin cream (BACTROBAN) 2 % Apply topically daily. Patient taking differently: Apply topically daily as needed.  03/23/16   Donzetta Starch, NP  nitroGLYCERIN (NITROSTAT) 0.4 MG SL tablet Place 1 tablet (0.4 mg total) under the tongue every 5 (five) minutes x 3 doses as needed for chest pain. 08/29/14   Charolette Forward, MD  Omega-3 Fatty Acids (FISH OIL) 1000 MG CAPS Take 2-3 capsules by mouth 3 (three) times daily. Take 7 tables a day to lower triglycerides per list 2 tablets in the morning, 3 tablets at noon, 2 tablets at bedtime    [provider]  oxyCODONE-acetaminophen (PERCOCET/ROXICET) 5-325 MG per tablet Take 2 tablets by mouth every 4 (four) hours. Take eight tablets daily as needed for pain per family and list. For bad  Back pain    [provider]  pravastatin (PRAVACHOL) 20 MG tablet Take 1 tablet (20 mg total) by mouth once a week. 06/02/16 08/31/16  Arnoldo Lenis, MD  sertraline (ZOLOFT) 100 MG tablet Take 100 mg by mouth daily.    [provider]  tiotropium (SPIRIVA) 18 MCG inhalation capsule Place 18 mcg into inhaler and inhale at bedtime.     [provider]  vitamin B-12 (CYANOCOBALAMIN) 1000 MCG tablet Take 1,000 mcg by mouth daily.    [provider]    Family History    Family History  Problem Relation Age of Onset  . Hypertension Father   . Heart disease Father     Social History    Social  History   Socioeconomic History  . Marital status: Married    Spouse name: Not on file  . Number of children: Not on file  . Years of education: Not on file  . Highest education level: Not on file  Social Needs  . Financial resource strain: Not on file  . Food insecurity - worry: Not on file  . Food insecurity - inability: Not on file  . Transportation needs - medical: Not on file  . Transportation needs - non-medical: Not on file  Occupational History  . Not on file  Tobacco Use  .  Smoking status: Former Smoker    Packs/day: 3.00    Years: 45.00    Pack years: 135.00    Types: Cigarettes  . Smokeless tobacco: Never Used  Substance and Sexual Activity  . Alcohol use: No  . Drug use: No  . Sexual activity: Not on file  Other Topics Concern  . Not on file  Social History Narrative  . Not on file     Review of Systems    See HPi  All other systems reviewed and are otherwise negative except as noted above.  Physical Exam    Blood pressure (!) 115/57, pulse (!) 52, temperature 98.9 F (37.2 C), temperature source Oral, resp. rate 19, height 6\' 1"  (1.854 m), weight 295 lb (133.8 kg), SpO2 93 %.  General: Obese older WM, diaphoretic, ill appearing Psych: Normal affect. Neuro: Alert and oriented X 3. Moves all extremities spontaneously. HEENT: Normal  Neck: Supple, unable to assess JVD due to girth. Lungs:  Resp regular and unlabored, CTA. Heart: IRREG IRREG no s3, s4, or murmurs. Abdomen: Soft, non-tender, non-distended, BS + x 4.  Extremities: No clubbing, cyanosis 1+ LE edema bilaterally.  Labs    Troponin [facility] of Care Test) Recent Labs    07/14/17 0931  TROPIPOC 18.10*   Recent Labs    07/14/17 0922  TROPONINI 20.79*   Lab Results  Component Value Date   WBC 13.1 (H) 07/14/2017   HGB 12.6 (L) 07/14/2017   HCT 37.0 (L) 07/14/2017   MCV 88.4 07/14/2017   PLT 168 07/14/2017    Recent Labs  Lab 07/14/17 0922 07/14/17 0937  NA 130* 130*  K 4.1  4.6  CL 91* 94*  CO2 28  --   BUN 29* 36*  CREATININE 1.50* 1.50*  CALCIUM 8.6*  --   GLUCOSE 234* 219*   Lab Results  Component Value Date   CHOL 168 03/17/2016   HDL 26 (L) 03/17/2016   LDLCALC UNABLE TO CALCULATE IF TRIGLYCERIDE OVER 400 mg/dL 03/17/2016   TRIG 561 (H) 03/17/2016   No results found for: [facility]   Radiology Studies    No results found.  ECG & Cardiac Imaging    EKG: Afib with ST elevation in the inferolateral leads  Echo: 6/17  Study Conclusions  - Left ventricle: Poor acoustic windows limit study Definity used   to optimize Overall LVEF appears normal at 55 to 60% The cavity   size was normal. Wall thickness was increased in a pattern of   moderate LVH. Doppler parameters are consistent with abnormal   left ventricular relaxation (grade 1 diastolic dysfunction). - Left atrium: The atrium was moderately dilated.  Assessment & Plan    66 yo male with PMH of CAD (CTO of RCA, with residual disease in LAD/Lcx), COPD, HL, HTN, obesity, DM and tobacco use who presented to [facility] with chest pain and dyspnea.   1. STEMI: Presented to [facility] with 3 days of chest pain, and worsening dyspnea. CODE STEMI called with ST elevation in inferolateral leads. He was brought to the lab emergently for cath. Trop on admission was 20. Further recommendations post cath.   2. HTN: On low dose BB. May need to further increase given Afib. Will defer til after cath.  3. HL: on pravastatin, will switch to Lipitor 40mg   4. COPD: stable on home O2  5. New onset Afib: On Dilt gtt, and heparin. Rates stable currently  6. Chronic diastolic HF: According to wife his LE edema  is better than his baseline. Respiratory status stable. Will hold lasix for now post cath.   7. IDDM: continue home insulin regimen, hold metformin -- check Hgb A1c  8. CVA with known L ICA 90%: Wife reports he had a "light stroke" last year and weakness on the right side. This morning had difficulty holding a  cup, but no other neurological symptoms. No weakness noted on exam, unable to assess right hand as TR band present.   Barnet Pall, NP-C Pager [phone] 07/14/2017, 10:46 AM Patient seen and examined and history reviewed. Agree with above findings and plan. 66 yo WM with history of CAD with known RCA occlusion from 2015 is [facility] from [facility] with acute STEMI. Patient presented with 3 day history of chest pain. Thought it might be indigestion. On presentation he was in AFib with RVR and Ecg showed new ST elevation in the inferolateral leads. Wife reports some weakness/clumsiness in right hand yesterday but he denies.   On exam he is morbidly obese. Lungs are clear anteriorly. He appears in poor health. CV IRR without gallop or murmur. Abdomen is obese, soft, NT. He has chronic LE edema. Pulses are palpable. No focal neuro deficits.   Impression: Acute inferolateral STEMI. Late presentation with 3 days of chest pain and troponin >20. He also has Afib with RVR. He is on chronic ASA/Plavix. Will proceed with emergent cardiac cath with possible PCI. Anticipate he will need anticoagulation for Afib with high [place] vasc score.   Willo Yoon [place], [place] 07/14/2017 1:16 PM

## 2017-07-14 NOTE — Progress Notes (Signed)
ANTICOAGULATION CONSULT NOTE - Initial Consult  Pharmacy Consult for heparin Indication: atrial fibrillation  Allergies  Allergen Reactions  . Simvastatin     Muscle weakness    Patient Measurements: Height: 6\' 1"  (185.4 cm) Weight: 295 lb (133.8 kg) IBW/kg (Calculated) : 79.9 Heparin Dosing Weight: 110kg  Vital Signs: Temp: 98 F (36.7 C) (11/15 1224) Temp Source: Oral (11/15 1224) BP: 126/72 (11/15 1300) Pulse Rate: 103 (11/15 1314)  Labs: Recent Labs    07/14/17 0922 07/14/17 0937  HGB 12.3* 12.6*  HCT 37.3* 37.0*  PLT 168  --   CREATININE 1.50* 1.50*  TROPONINI 20.79*  --     Estimated Creatinine Clearance: 69.5 mL/min (A) (by C-G formula based on SCr of 1.5 mg/dL (H)).   Medical History: Past Medical History:  Diagnosis Date  . CAD (coronary artery disease)    a. prior silent inferior MI, NSTEMI 07/2014 with chronically occluded RCA s/p unsuccessful PCI with otherwise nonobstructive residual disease  . Chronic respiratory failure ([place])    a. Placed on home O2 01/2016.  [name] Kitchen COPD (chronic obstructive pulmonary disease) ([place])   . Diabetes mellitus ([place])   . Former tobacco use   . Hyperlipidemia   . Hypertension   . Morbid obesity ([place])   . Neuropathy   . Sleep apnea     Assessment: 66 year old male presenting as stemi. Unable to restore flow to LAD and LCx. Patient reloaded with plavix. May need cvts consult for CABG. Patient also with new afib, currently on diltiazem drip. Orders to start IV heparin for anticoagulation tonight post-cath.  Goal of Therapy:  Heparin level 0.3-0.7 units/ml Monitor platelets by anticoagulation protocol: Yes   Plan:  Start heparin infusion at 1400 units/hr Check anti-Xa level in 6 hours and daily while on heparin Continue to monitor H&H and platelets  Erin Hearing PharmD., BCPS Clinical Pharmacist Pager [phone] 07/14/2017 1:25 PM

## 2017-07-14 NOTE — Progress Notes (Signed)
Bottle of Oxycodone found in patients pocket. Bottle given to wife Jose [name] to take home. Educated patient on the need for [place] to administer his pain medication for patient safety.   Patient now complaining of 6/10 chest pain/ pressure and pain on inspiration. Obtaining repeat EKG. Mancel Bale, NP notified. Will continue to monitor closely.  Lucius Conn, RN

## 2017-07-14 NOTE — Progress Notes (Signed)
  Echocardiogram 2D Echocardiogram has been performed.  Jose [name] 07/14/2017, 4:04 PM

## 2017-07-14 NOTE — ED Triage Notes (Addendum)
Pt brought in by EMS due cp and sob for days. Pain mid sternal area and pain with breathing. Per EMS pt given one nitro due to chest pain pt has taken 3 prior to EMS and took baby asa x 4. Pt reports coughing 3 days ago but none since. Pain worth with inspiration. A fib on monitor. BP on scene  192 /80 HR 121-142

## 2017-07-14 NOTE — Progress Notes (Signed)
CRITICAL VALUE ALERT  Critical Value:  EKG Acute MI STEMI with 6/10 chest pain  Date & Time Notied:  07/14/2017 13:20   Provider Notified: Reino Bellis, NP   Orders Received/Actions taken: See new Morphine orders. Will administer and continue to monitor   Lucius Conn, RN

## 2017-07-14 NOTE — ED Provider Notes (Signed)
[facility] EMERGENCY DEPARTMENT Provider Note   CSN: [id number] Arrival date & time: 07/14/17  0901     History   Chief Complaint Chief Complaint  Patient presents with  . Chest Pain    HPI Jose [name] is a 66 y.o. male.  The history is provided by the patient and the spouse. The history is limited by the condition of the patient (acuity of condition).  Chest Pain    Pt was seen at 0915. Per pt and his wife: Pt c/o multiple intermittent episodes of chest "tightness" for the past 3 days. Has been associated with SOB and cough. Pt has been taking his own SL ntg with improvement of his symptoms. EMS gave ASA 325mg  PTA, and noted afib/RVR on their monitor. Pt denies palpitations, no N/V/D, no back pain, no abd pain.   Past Medical History:  Diagnosis Date  . CAD (coronary artery disease)    a. prior silent inferior MI, NSTEMI 07/2014 with chronically occluded RCA s/p unsuccessful PCI with otherwise nonobstructive residual disease  . Chronic respiratory failure ([place])    a. Placed on home O2 01/2016.  [name] Kitchen COPD (chronic obstructive pulmonary disease) ([place])   . Diabetes mellitus ([place])   . Former tobacco use   . Hyperlipidemia   . Hypertension   . Morbid obesity ([place])   . Neuropathy   . Sleep apnea     Patient Active Problem List   Diagnosis Date Noted  . CAD-failed PCI attempt Dec 2015- medical Rx 03/23/2016  . Chronic diastolic CHF (congestive heart failure) ([place])   . Hyperkalemia   . AKI (acute kidney injury) ([place])   . Carotid stenosis, symptomatic w/o infarct   . CHF (congestive heart failure) ([place])   . Hemispheric carotid artery syndrome   . HLD (hyperlipidemia)   . Essential hypertension   . Uncontrolled type 2 diabetes mellitus with circulatory disorder ([place])   . Hyperglycemia   . TIA (transient ischemic attack) 03/16/2016  . Accelerated hypertension 02/25/2016  . Acute diastolic CHF (congestive heart failure) ([place]) 02/24/2016  . COPD (chronic obstructive  pulmonary disease) ([place]) 02/24/2016  . Acute respiratory failure with hypoxia ([place]) 02/24/2016  . HTN (hypertension) 02/24/2016  . Diabetes ([place]) 02/24/2016  . Morbid obesity ([place]) 02/24/2016  . OSA (obstructive sleep apnea) 02/24/2016  . Elevated troponin 02/24/2016  . ACS (acute coronary syndrome) ([place]) 08/27/2014    Past Surgical History:  Procedure Laterality Date  . BACK SURGERY    . LEFT HEART CATHETERIZATION WITH CORONARY ANGIOGRAM N/A 08/28/2014   Procedure: LEFT HEART CATHETERIZATION WITH CORONARY ANGIOGRAM;  Surgeon: Clent Demark, MD;  Location: [facility] CATH LAB;  Service: Cardiovascular;  Laterality: N/A;  . PERIPHERAL VASCULAR CATHETERIZATION Bilateral 03/22/2016   Procedure: Carotid Angiography;  Surgeon: Elam Dutch, MD;  Location: [place] CV LAB;  Service: Cardiovascular;  Laterality: Bilateral;       Home Medications    Prior to Admission medications   Medication Sig Start Date End Date Taking? Authorizing Provider  aspirin 81 MG tablet Take 81 mg by mouth daily.    [provider]  clopidogrel (PLAVIX) 75 MG tablet Take 1 tablet (75 mg total) by mouth daily. 03/23/16   Donzetta Starch, NP  ergocalciferol (VITAMIN D2) 50000 UNITS capsule Take 50,000 Units by mouth See admin instructions. Take one twice a week on wednesdays and saturdays    [provider]  ferrous sulfate 325 (65 FE) MG tablet Take 325 mg by mouth 3 (three)  times daily with meals.    [provider]  furosemide (LASIX) 40 MG tablet Take 40 mg by mouth 2 (two) times daily.    [provider]  glipiZIDE (GLUCOTROL) 5 MG tablet Take 5-10 mg by mouth 4 (four) times daily. 10mg  in the morning, 5mg  in the afternoon, 5mg  in the evening    [provider]  insulin aspart (NOVOLOG) 100 UNIT/ML injection Inject 30 Units into the skin 3 (three) times daily before meals.     [provider]  insulin glargine (LANTUS) 100 UNIT/ML injection Inject 0.3-0.6  mLs (30-60 Units total) into the skin See admin instructions. 50 units in the AM and 60 units in the PM 03/23/16   Burnetta Sabin L, NP  magnesium oxide (MAG-OX) 400 MG tablet Take 1 tablet (400 mg total) by mouth daily. 03/04/16   Arnoldo Lenis, MD  metFORMIN (GLUCOPHAGE) 1000 MG tablet Take 1 tablet (1,000 mg total) by mouth 2 (two) times daily with a meal. 08/30/14   Charolette Forward, MD  metoprolol tartrate (LOPRESSOR) 25 MG tablet Take 0.5 tablets (12.5 mg total) by mouth 2 (two) times daily. 03/23/16   Donzetta Starch, NP  Multiple Vitamins-Minerals (ICAPS AREDS 2 PO) Take 1 capsule by mouth 2 (two) times daily.     [provider]  mupirocin cream (BACTROBAN) 2 % Apply topically daily. Patient taking differently: Apply topically daily as needed.  03/23/16   Donzetta Starch, NP  nitroGLYCERIN (NITROSTAT) 0.4 MG SL tablet Place 1 tablet (0.4 mg total) under the tongue every 5 (five) minutes x 3 doses as needed for chest pain. 08/29/14   Charolette Forward, MD  Omega-3 Fatty Acids (FISH OIL) 1000 MG CAPS Take 2-3 capsules by mouth 3 (three) times daily. Take 7 tables a day to lower triglycerides per list 2 tablets in the morning, 3 tablets at noon, 2 tablets at bedtime    [provider]  oxyCODONE-acetaminophen (PERCOCET/ROXICET) 5-325 MG per tablet Take 2 tablets by mouth every 4 (four) hours. Take eight tablets daily as needed for pain per family and list. For bad  Back pain    [provider]  pravastatin (PRAVACHOL) 20 MG tablet Take 1 tablet (20 mg total) by mouth once a week. 06/02/16 08/31/16  Arnoldo Lenis, MD  sertraline (ZOLOFT) 100 MG tablet Take 100 mg by mouth daily.    [provider]  tiotropium (SPIRIVA) 18 MCG inhalation capsule Place 18 mcg into inhaler and inhale at bedtime.     [provider]  vitamin B-12 (CYANOCOBALAMIN) 1000 MCG tablet Take 1,000 mcg by mouth daily.    [provider]    Family History No family history on  file.  Social History Social History   Tobacco Use  . Smoking status: Former Smoker    Packs/day: 3.00    Years: 45.00    Pack years: 135.00    Types: Cigarettes  . Smokeless tobacco: Never Used  Substance Use Topics  . Alcohol use: No  . Drug use: No     Allergies   Simvastatin   Review of Systems Review of Systems  Unable to perform ROS: Acuity of condition  Cardiovascular: Positive for chest pain.     Physical Exam Updated Vital Signs BP (!) 115/57 (BP Location: Right Arm)   Pulse (!) 124   Temp 98.9 F (37.2 C) (Oral)   Resp 17   Ht 6\' 1"  (1.854 m)   Wt 133.8 kg (295 lb)  SpO2 94%   BMI 38.92 kg/m   Physical Exam 0920: Physical examination:  Nursing notes reviewed; Vital signs and O2 SAT reviewed;  Constitutional: Well developed, Well nourished, Well hydrated, In no acute distress; Head:  Normocephalic, atraumatic; Eyes: EOMI, PERRL, No scleral icterus; ENMT: Mouth and pharynx normal, Mucous membranes moist; Neck: Supple, Full range of motion, No lymphadenopathy; Cardiovascular: Irregular tachycardic rate and rhythm, No gallop; Respiratory: Breath sounds clear & equal bilaterally, No wheezes.  Speaking full sentences with ease, Normal respiratory effort/excursion; Chest: Nontender, Movement normal; Abdomen: Soft, Nontender, Nondistended, Normal bowel sounds; Genitourinary: No CVA tenderness; Extremities: Pulses normal, No tenderness, No edema, No calf edema or asymmetry.; Neuro: AA&Ox3, Major CN grossly intact.  Speech clear. No gross focal motor or sensory deficits in extremities.; Skin: Color normal, Warm, Dry.   ED Treatments / Results  Labs (all labs ordered are listed, but only abnormal results are displayed)   EKG  EKG Interpretation  Date/Time:  Thursday July 14 2017 09:11:18 EST Ventricular Rate:  138 PR Interval:    QRS Duration: 120 QT Interval:  334 QTC Calculation: 507 R Axis:   106 Text Interpretation:  Atrial fibrillation with rapid  ventricular response Nonspecific intraventricular conduction delay Inferior infarct, recent Anterolateral infarct, acute (LAD) When compared with ECG of 03/16/2016 Atrial fibrillation and STEMI are now Present Confirmed by Francine Graven [phone]) on 07/14/2017 9:58:03 AM       Radiology   Procedures Procedures (including critical care time)  Medications Ordered in ED Medications  diltiazem (CARDIZEM) 1 mg/mL load via infusion 10 mg ( Intravenous New Bag/Given 07/14/17 0919)    And  diltiazem (CARDIZEM) 100 mg in dextrose 5% 16mL (1 mg/mL) infusion (15 mg/hr Intravenous Bolus from Bag 07/14/17 0946)  heparin ADULT infusion 100 units/mL (25000 units/226mL sodium chloride 0.45%) (1,000 Units/hr Intravenous New Bag/Given 07/14/17 0933)  heparin bolus via infusion 4,000 Units (4,000 Units Intravenous Bolus from Bag 07/14/17 0935)     Initial Impression / Assessment and Plan / ED Course  I have reviewed the triage vital signs and the nursing notes.  Pertinent labs & imaging results that were available during my care of the patient were reviewed by me and considered in my medical decision making (see chart for details).  MDM Reviewed: previous chart, nursing note and vitals Reviewed previous: labs and ECG Interpretation: labs and ECG Total time providing critical care: 30-74 minutes. This excludes time spent performing separately reportable procedures and services. Consults: cardiology   CRITICAL CARE Performed by: Alfonzo Feller Total critical care time: 35 minutes Critical care time was exclusive of separately billable procedures and treating other patients. Critical care was necessary to treat or prevent imminent or life-threatening deterioration. Critical care was time spent personally by me on the following activities: development of treatment plan with patient and/or surrogate as well as nursing, discussions with consultants, evaluation of patient's response to treatment,  examination of patient, obtaining history from patient or surrogate, ordering and performing treatments and interventions, ordering and review of laboratory studies, ordering and review of radiographic studies, pulse oximetry and re-evaluation of patient's condition.   Results for orders placed or performed during the hospital encounter of 07/14/17  CBC  Result Value Ref Range   WBC 13.1 (H) 4.0 - 10.5 K/uL   RBC 4.22 4.22 - 5.81 MIL/uL   Hemoglobin 12.3 (L) 13.0 - 17.0 g/dL   HCT 37.3 (L) 39.0 - 52.0 %   MCV 88.4 78.0 - 100.0 fL   MCH 29.1  26.0 - 34.0 pg   MCHC 33.0 30.0 - 36.0 g/dL   RDW 15.0 11.5 - 15.5 %   Platelets 168 150 - 400 K/uL  I-stat troponin, ED  Result Value Ref Range   Troponin i, poc 18.10 (HH) 0.00 - 0.08 ng/mL   Comment NOTIFIED PHYSICIAN    Comment 3          I-stat Chem 8, ED  Result Value Ref Range   Sodium 130 (L) 135 - 145 mmol/L   Potassium 4.6 3.5 - 5.1 mmol/L   Chloride 94 (L) 101 - 111 mmol/L   BUN 36 (H) 6 - 20 mg/dL   Creatinine, Ser 1.50 (H) 0.61 - 1.24 mg/dL   Glucose, Bld 219 (H) 65 - 99 mg/dL   Calcium, Ion 0.95 (L) 1.15 - 1.40 mmol/L   TCO2 29 22 - 32 mmol/L   Hemoglobin 12.6 (L) 13.0 - 17.0 g/dL   HCT 37.0 (L) 39.0 - 52.0 %    0915: EKG obtained, Code STEMI activated. T/C back from Cath Lab RN with Cards Dr. Martinique: states MD has viewed the EKG and agrees regarding STEMI. [facility] en route. Pt has already received ASA PTA. Will dose IV heparin and IV cardizem bolus and gtt for afib/RVR with rates to 150's.   0945:  IV cardizem 2nd bolus given with HR decreasing to 90's; IV cardizem gtt continues. Pt denies CP/palpitations/SOB. [facility] to [facility] to [facility].     Final Clinical Impressions(s) / ED Diagnoses   Final diagnoses:  ST elevation myocardial infarction (STEMI), unspecified artery [facility])  Atrial fibrillation with rapid ventricular response [facility])    ED Discharge Orders    None       Francine Graven, DO 07/15/17 1534

## 2017-07-14 NOTE — Progress Notes (Signed)
NP order for 2 mg Morphine for chest pain. Administered the 2 mg morphine at 12:48. At this time patients CP is 5/10. NP notified. Will continue to monitor.  Lucius Conn, RN

## 2017-07-14 NOTE — ED Notes (Signed)
Doug with [facility] sent to Dr. Thurnell Garbe at [phone].

## 2017-07-14 NOTE — Progress Notes (Signed)
CRITICAL VALUE ALERT  Critical Value:  Troponin 16.59  Date & Time Notied:  07/14/2017 17:42  Provider Notified: Rosita Fire, PA   Orders Received/Actions taken: No new orders at this time. Will continue to monitor closely.  Lucius Conn, RN

## 2017-07-14 NOTE — ED Notes (Signed)
[facility] is here to transport pt to Cath Lab

## 2017-07-14 NOTE — Progress Notes (Signed)
Upon rounding, pt found to be lethargic. Very different than before. Checked pt's BG as he did not want much dinner, but BG 244. BP 98/62 map 74, HR 93 afib, no complaints of chest pain. Stopped nitro gtt at this time for hypotension. Rosita Fire, [place] paged to come to bedside. Will continue to monitor.  Lucius Conn, RN

## 2017-07-14 NOTE — Plan of Care (Signed)
Progressing

## 2017-07-14 NOTE — ED Notes (Addendum)
Spoke with Clarise Cruz at [facility] to initiate STEMI. Paging Dr. Martinique.

## 2017-07-15 ENCOUNTER — Inpatient Hospital Stay (HOSPITAL_COMMUNITY): Payer: Medicare Other

## 2017-07-15 ENCOUNTER — Encounter (INDEPENDENT_AMBULATORY_CARE_PROVIDER_SITE_OTHER): Payer: Non-veteran care | Admitting: Ophthalmology

## 2017-07-15 DIAGNOSIS — G4733 Obstructive sleep apnea (adult) (pediatric): Secondary | ICD-10-CM

## 2017-07-15 LAB — BASIC METABOLIC PANEL
Anion gap: 14 (ref 5–15)
Anion gap: 12 (ref 5–15)
BUN: 36 mg/dL — AB (ref 6–20)
BUN: 44 mg/dL — ABNORMAL HIGH (ref 6–20)
CALCIUM: 8.2 mg/dL — AB (ref 8.9–10.3)
CHLORIDE: 92 mmol/L — AB (ref 101–111)
CO2: 23 mmol/L (ref 22–32)
CO2: 26 mmol/L (ref 22–32)
CREATININE: 2.59 mg/dL — AB (ref 0.61–1.24)
CREATININE: 2.9 mg/dL — AB (ref 0.61–1.24)
Calcium: 8.2 mg/dL — ABNORMAL LOW (ref 8.9–10.3)
Chloride: 93 mmol/L — ABNORMAL LOW (ref 101–111)
GFR calc Af Amer: 28 mL/min — ABNORMAL LOW (ref >60)
GFR calc non Af Amer: 21 mL/min — ABNORMAL LOW (ref >60)
GFR, EST AFRICAN AMERICAN: 24 mL/min — AB (ref >60)
GFR, EST NON AFRICAN AMERICAN: 24 mL/min — AB (ref >60)
GLUCOSE: 229 mg/dL — AB (ref 65–99)
GLUCOSE: 96 mg/dL (ref 65–99)
POTASSIUM: 5.3 mmol/L — AB (ref 3.5–5.1)
Potassium: 4.5 mmol/L (ref 3.5–5.1)
Sodium: 130 mmol/L — ABNORMAL LOW (ref 135–145)
Sodium: 130 mmol/L — ABNORMAL LOW (ref 135–145)

## 2017-07-15 LAB — GLUCOSE, CAPILLARY
GLUCOSE-CAPILLARY: 227 mg/dL — AB (ref 65–99)
GLUCOSE-CAPILLARY: 177 mg/dL — AB (ref 65–99)
GLUCOSE-CAPILLARY: 77 mg/dL (ref 65–99)
Glucose-Capillary: 92 mg/dL (ref 65–99)

## 2017-07-15 LAB — URINALYSIS, ROUTINE W REFLEX MICROSCOPIC
BILIRUBIN URINE: NEGATIVE
GLUCOSE, UA: 50 mg/dL — AB
Hgb urine dipstick: NEGATIVE
KETONES UR: NEGATIVE mg/dL
Leukocytes, UA: NEGATIVE
NITRITE: NEGATIVE
PH: 5 (ref 5.0–8.0)
Specific Gravity, Urine: 1.038 — ABNORMAL HIGH (ref 1.005–1.030)

## 2017-07-15 LAB — HEPARIN LEVEL (UNFRACTIONATED): HEPARIN UNFRACTIONATED: 0.2 [IU]/mL — AB (ref 0.30–0.70)

## 2017-07-15 LAB — TROPONIN I
TROPONIN I: 22.73 ng/mL — AB (ref <0.03)
Troponin I: 18.41 ng/mL (ref <0.03)

## 2017-07-15 LAB — PROTEIN / CREATININE RATIO, URINE
Creatinine, Urine: 171.58 mg/dL
Protein Creatinine Ratio: 4.02 mg/mg{Cre} — ABNORMAL HIGH (ref 0.00–0.15)
TOTAL PROTEIN, URINE: 690 mg/dL

## 2017-07-15 LAB — HEMOGLOBIN A1C
HEMOGLOBIN A1C: 7.9 % — AB (ref 4.8–5.6)
Mean Plasma Glucose: 180.03 mg/dL

## 2017-07-15 LAB — LIPID PANEL
CHOL/HDL RATIO: 7.1 ratio
Cholesterol: 264 mg/dL — ABNORMAL HIGH (ref 0–200)
HDL: 37 mg/dL — ABNORMAL LOW (ref >40)
LDL Cholesterol: 176 mg/dL — ABNORMAL HIGH (ref 0–99)
Triglycerides: 255 mg/dL — ABNORMAL HIGH (ref <150)
VLDL: 51 mg/dL — ABNORMAL HIGH (ref 0–40)

## 2017-07-15 LAB — SODIUM, URINE, RANDOM

## 2017-07-15 LAB — CBC
HCT: 36.6 % — ABNORMAL LOW (ref 39.0–52.0)
Hemoglobin: 11.9 g/dL — ABNORMAL LOW (ref 13.0–17.0)
MCH: 28.3 pg (ref 26.0–34.0)
MCHC: 32.5 g/dL (ref 30.0–36.0)
MCV: 86.9 fL (ref 78.0–100.0)
PLATELETS: 187 10*3/uL (ref 150–400)
RBC: 4.21 MIL/uL — ABNORMAL LOW (ref 4.22–5.81)
RDW: 15.2 % (ref 11.5–15.5)
WBC: 15.5 10*3/uL — AB (ref 4.0–10.5)

## 2017-07-15 LAB — OSMOLALITY: Osmolality: 298 mOsm/kg — ABNORMAL HIGH (ref 275–295)

## 2017-07-15 LAB — BRAIN NATRIURETIC PEPTIDE: B NATRIURETIC PEPTIDE 5: 258.5 pg/mL — AB (ref 0.0–100.0)

## 2017-07-15 LAB — IRON AND TIBC
Iron: 17 ug/dL — ABNORMAL LOW (ref 45–182)
Saturation Ratios: 7 % — ABNORMAL LOW (ref 17.9–39.5)
TIBC: 231 ug/dL — ABNORMAL LOW (ref 250–450)
UIBC: 214 ug/dL

## 2017-07-15 LAB — OSMOLALITY, URINE: Osmolality, Ur: 463 mOsm/kg (ref 300–900)

## 2017-07-15 MED ORDER — INSULIN ASPART 100 UNIT/ML ~~LOC~~ SOLN
0.0000 [IU] | Freq: Every day | SUBCUTANEOUS | Status: DC
  Administered 2017-07-19 – 2017-07-20 (×2): 2 [IU] via SUBCUTANEOUS
  Administered 2017-07-21: 5 [IU] via SUBCUTANEOUS
  Administered 2017-07-22 – 2017-07-23 (×2): 3 [IU] via SUBCUTANEOUS
  Administered 2017-07-24: 2 [IU] via SUBCUTANEOUS
  Administered 2017-07-24 – 2017-07-25 (×2): 4 [IU] via SUBCUTANEOUS
  Administered 2017-07-27: 3 [IU] via SUBCUTANEOUS
  Administered 2017-07-30 – 2017-07-31 (×2): 2 [IU] via SUBCUTANEOUS

## 2017-07-15 MED ORDER — ISOSORBIDE MONONITRATE ER 30 MG PO TB24
30.0000 mg | ORAL_TABLET | Freq: Every day | ORAL | Status: DC
  Administered 2017-07-15 – 2017-08-03 (×19): 30 mg via ORAL
  Filled 2017-07-15 (×19): qty 1

## 2017-07-15 MED ORDER — OXYCODONE-ACETAMINOPHEN 5-325 MG PO TABS
2.0000 | ORAL_TABLET | ORAL | Status: DC | PRN
  Administered 2017-07-15 – 2017-08-09 (×88): 2 via ORAL
  Filled 2017-07-15 (×88): qty 2

## 2017-07-15 MED ORDER — SODIUM POLYSTYRENE SULFONATE 15 GM/60ML PO SUSP
15.0000 g | Freq: Once | ORAL | Status: AC
  Administered 2017-07-15: 15 g via ORAL
  Filled 2017-07-15: qty 60

## 2017-07-15 MED ORDER — INSULIN ASPART 100 UNIT/ML ~~LOC~~ SOLN
0.0000 [IU] | Freq: Three times a day (TID) | SUBCUTANEOUS | Status: DC
  Administered 2017-07-16 (×2): 2 [IU] via SUBCUTANEOUS
  Administered 2017-07-17: 1 [IU] via SUBCUTANEOUS
  Administered 2017-07-18: 5 [IU] via SUBCUTANEOUS
  Administered 2017-07-18: 2 [IU] via SUBCUTANEOUS
  Administered 2017-07-19 (×2): 3 [IU] via SUBCUTANEOUS
  Administered 2017-07-19: 1 [IU] via SUBCUTANEOUS
  Administered 2017-07-20: 3 [IU] via SUBCUTANEOUS
  Administered 2017-07-20: 5 [IU] via SUBCUTANEOUS
  Administered 2017-07-20 – 2017-07-21 (×2): 3 [IU] via SUBCUTANEOUS
  Administered 2017-07-21: 7 [IU] via SUBCUTANEOUS
  Administered 2017-07-21: 5 [IU] via SUBCUTANEOUS
  Administered 2017-07-22: 9 [IU] via SUBCUTANEOUS
  Administered 2017-07-22: 7 [IU] via SUBCUTANEOUS
  Administered 2017-07-22: 3 [IU] via SUBCUTANEOUS
  Administered 2017-07-23: 1 [IU] via SUBCUTANEOUS
  Administered 2017-07-23 (×2): 5 [IU] via SUBCUTANEOUS
  Administered 2017-07-24: 3 [IU] via SUBCUTANEOUS
  Administered 2017-07-24: 2 [IU] via SUBCUTANEOUS
  Administered 2017-07-24: 5 [IU] via SUBCUTANEOUS
  Administered 2017-07-25 (×2): 3 [IU] via SUBCUTANEOUS
  Administered 2017-07-25 – 2017-07-26 (×2): 5 [IU] via SUBCUTANEOUS
  Administered 2017-07-26 (×2): 7 [IU] via SUBCUTANEOUS
  Administered 2017-07-27: 3 [IU] via SUBCUTANEOUS
  Administered 2017-07-27: 5 [IU] via SUBCUTANEOUS
  Administered 2017-07-27: 3 [IU] via SUBCUTANEOUS
  Administered 2017-07-28: 2 [IU] via SUBCUTANEOUS
  Administered 2017-07-28 – 2017-07-30 (×3): 3 [IU] via SUBCUTANEOUS
  Administered 2017-07-31 (×3): 2 [IU] via SUBCUTANEOUS
  Administered 2017-08-01 (×2): 1 [IU] via SUBCUTANEOUS
  Administered 2017-08-02: 2 [IU] via SUBCUTANEOUS
  Administered 2017-08-02 (×2): 1 [IU] via SUBCUTANEOUS
  Administered 2017-08-03 – 2017-08-06 (×9): 2 [IU] via SUBCUTANEOUS
  Administered 2017-08-06: 3 [IU] via SUBCUTANEOUS
  Administered 2017-08-07: 2 [IU] via SUBCUTANEOUS
  Administered 2017-08-07: 5 [IU] via SUBCUTANEOUS
  Administered 2017-08-07: 2 [IU] via SUBCUTANEOUS
  Administered 2017-08-08: 3 [IU] via SUBCUTANEOUS
  Administered 2017-08-08: 1 [IU] via SUBCUTANEOUS
  Administered 2017-08-08: 3 [IU] via SUBCUTANEOUS
  Administered 2017-08-09: 2 [IU] via SUBCUTANEOUS
  Administered 2017-08-09: 3 [IU] via SUBCUTANEOUS

## 2017-07-15 MED ORDER — METOPROLOL TARTRATE 25 MG PO TABS
25.0000 mg | ORAL_TABLET | Freq: Two times a day (BID) | ORAL | Status: DC
  Administered 2017-07-15 – 2017-07-19 (×9): 25 mg via ORAL
  Filled 2017-07-15 (×10): qty 1

## 2017-07-15 MED ORDER — ORAL CARE MOUTH RINSE
15.0000 mL | Freq: Two times a day (BID) | OROMUCOSAL | Status: DC
  Administered 2017-07-15 – 2017-07-27 (×10): 15 mL via OROMUCOSAL

## 2017-07-15 MED ORDER — OXYCODONE-ACETAMINOPHEN 5-325 MG PO TABS
1.0000 | ORAL_TABLET | Freq: Four times a day (QID) | ORAL | Status: DC | PRN
  Administered 2017-07-15: 1 via ORAL
  Filled 2017-07-15: qty 1

## 2017-07-15 MED ORDER — CHLORHEXIDINE GLUCONATE 0.12 % MT SOLN
15.0000 mL | Freq: Two times a day (BID) | OROMUCOSAL | Status: DC
  Administered 2017-07-15 – 2017-07-27 (×15): 15 mL via OROMUCOSAL
  Filled 2017-07-15 (×18): qty 15

## 2017-07-15 MED ORDER — HEPARIN BOLUS VIA INFUSION
3000.0000 [IU] | Freq: Once | INTRAVENOUS | Status: AC
  Administered 2017-07-15: 3000 [IU] via INTRAVENOUS
  Filled 2017-07-15: qty 3000

## 2017-07-15 MED FILL — Nitroglycerin IV Soln 100 MCG/ML in D5W: INTRA_ARTERIAL | Qty: 10 | Status: AC

## 2017-07-15 MED FILL — Lidocaine HCl Local Preservative Free (PF) Inj 1%: INTRAMUSCULAR | Qty: 30 | Status: AC

## 2017-07-15 NOTE — Progress Notes (Signed)
EKG CRITICAL VALUE     12 lead EKG performed.  Critical value noted. Melisa g., RN notified.   Delfin Edis, [place] 07/15/2017 7:40 AM

## 2017-07-15 NOTE — Progress Notes (Signed)
The patient has been seen in conjunction with Ledell Noss, MD. All aspects of care have been considered and discussed. The patient has been personally interviewed, examined, and all clinical data has been reviewed.   Complicated situation in this 66 year old gentleman who presented with ST elevation V4 through 6 and also involving the inferior leads but he was found to have total occlusion of the mid and apical LAD.  Underwent emergency cath and attempted PCI but without return of flow in the LAD.  He also has known chronic total occlusion of the RCA and was in atrial fibrillation with rapid ventricular response on presentation.  Now left with a chronically occluded RCA, of presumed continued occlusion of the mid to distal LAD, and moderately severe diffuse disease in the circumflex.  Circumflex and LAD supply collaterals to the right coronary.  Echo LVEF is 40%.  Acute rise in serum creatinine for acute kidney disease aggravated by contrast. Overall plan: Load with amiodarone to prevent recurrent atrial fibrillation.  Titrate heart failure therapy to include long-acting nitrates, beta-blocker therapy, and hydralazine as tolerated.  Revascularization options are minimal but at some point TCTS should look at the patient to determine if there are surgical options.  I doubt he would survive surgery.  Monitor kidney function closely and consider nephrology consult.  Watch for worsening heart failure.  May ultimately require consideration of AICD and medical therapy for management of ischemic cardiomyopathy.  Progress Note  Patient Name: Jose [name] Date of Encounter: 07/15/2017  Primary Cardiologist: Dr. Harl Bowie   Subjective   Feeling comfortable today, no pain, difficulty breathing, or palpitations   Inpatient Medications    Scheduled Meds: . aspirin EC  81 mg Oral Daily  . atorvastatin  40 mg Oral q1800  . clopidogrel  75 mg Oral Daily  . ferrous sulfate  325 mg Oral TID WC  .  Influenza vac split quadrivalent PF  0.5 mL Intramuscular Tomorrow-1000  . insulin aspart  30 Units Subcutaneous TID AC  . insulin glargine  50 Units Subcutaneous Daily  . insulin glargine  60 Units Subcutaneous QHS  . magnesium oxide  400 mg Oral Daily  . mouth rinse  15 mL Mouth Rinse BID  . metoprolol tartrate  5 mg Intravenous QID  . metoprolol tartrate  12.5 mg Oral BID  . omega-3 acid ethyl esters  2 g Oral Q breakfast  . omega-3 acid ethyl esters  3 g Oral BID AC  . sertraline  100 mg Oral Daily  . sodium chloride flush  3 mL Intravenous Q12H  . tiotropium  18 mcg Inhalation QHS   Continuous Infusions: . sodium chloride    . amiodarone 30 mg/hr (07/15/17 0216)  . heparin 1,800 Units/hr (07/15/17 0351)  . nitroGLYCERIN Stopped (07/14/17 1915)   PRN Meds: sodium chloride, acetaminophen, morphine injection, nitroGLYCERIN, ondansetron (ZOFRAN) IV, sodium chloride flush   Vital Signs    Vitals:   07/15/17 0630 07/15/17 0700 07/15/17 0800 07/15/17 0833  BP: (!) 153/89 (!) 141/55 (!) 141/80 (!) 143/90  Pulse: 80 81 85 85  Resp: 19 17 20    Temp:   99.1 F (37.3 C)   TempSrc:   Oral   SpO2: 94% 94% 95%   Weight:      Height:        Intake/Output Summary (Last 24 hours) at 07/15/2017 0911 Last data filed at 07/15/2017 0600 Gross per 24 hour  Intake 1549.7 ml  Output 650 ml  Net 899.7 ml  Filed Weights   07/14/17 0910  Weight: 295 lb (133.8 kg)    Telemetry    Sinus rhythm, heart rate 80, ST elevation in II - Personally Reviewed  ECG    Sinus rhythm, rate 84, ST elevations in inferior and anterior leads with q waves in inferior leads - Personally Reviewed  Physical Exam   GEN: chronically ill appearing, appears older than stated age, sitting up in bed speaking to family, no acute distress.   Neck: No JVD Cardiac: RRR, no murmurs, rubs, or gallops.  Respiratory: bibasilar rhonchi which do not relieve with coughing  GI: Soft, nontender, non-distended  MS:  2+ lower extremity edema; No deformity. Neuro:  Nonfocal   Psych: Normal affect    Labs    Chemistry Recent Labs  Lab 07/14/17 0922 07/14/17 0937 07/15/17 0235  NA 130* 130* 130*  K 4.1 4.6 5.3*  CL 91* 94* 93*  CO2 28  --  23  GLUCOSE 234* 219* 229*  BUN 29* 36* 36*  CREATININE 1.50* 1.50* 2.59*  CALCIUM 8.6*  --  8.2*  GFRNONAA 47*  --  24*  GFRAA 54*  --  28*  ANIONGAP 11  --  14     Hematology Recent Labs  Lab 07/14/17 0922 07/14/17 0937 07/15/17 0235  WBC 13.1*  --  15.5*  RBC 4.22  --  4.21*  HGB 12.3* 12.6* 11.9*  HCT 37.3* 37.0* 36.6*  MCV 88.4  --  86.9  MCH 29.1  --  28.3  MCHC 33.0  --  32.5  RDW 15.0  --  15.2  PLT 168  --  187    Cardiac Enzymes Recent Labs  Lab 07/14/17 0922 07/14/17 1559 07/14/17 1929 07/15/17 0235  TROPONINI 20.79* 16.59* 17.03* 22.73*    Recent Labs  Lab 07/14/17 0931  TROPIPOC 18.10*     BNPNo results for input(s): BNP, PROBNP in the last 168 hours.   DDimer No results for input(s): DDIMER in the last 168 hours.   Radiology    No results found.  Cardiac Studies   Cardiac cath 07/14/2017  Dist LM to Ost LAD lesion is 70% stenosed.  Prox LAD to Mid LAD lesion is 30% stenosed.  Mid LAD to Dist LAD lesion is 100% stenosed.  Ost Cx lesion is 70% stenosed.  Prox RCA lesion is 100% stenosed.  LV end diastolic pressure is moderately elevated.  Balloon angioplasty was performed using a BALLOON EUPHORA [id number].  Post intervention, there is a 100% residual stenosis.   1. 3 vessel obstructive CAD.    - culprit lesion is occlusion of distal LAD. Unsuccessful attempt at PTCA- unable to restore flow despite balloon angioplasty.    - 70% ostial LAD with concentric calcified plaque. Minimal lumen area 5.2 mm squared by IVUS    - approximately 70% ostial LCx disease. Unable to cross with IVUS catheter.    - CTO of the proximal RCA with good left to right collaterals 2. Moderately elevated LVEDP 3. Persistent  Afib   Plan: aggressive medical management. Assess LV functio by Echo. It is unclear the significance of the ostial LAD and LCx. If he has refractory angina I would consider referral for CABG. If symptoms improve I would consider ischemic work up with a stress myoview in the near future to assess need for further revascularization.  Echocardiogram 07/14/2017  - Left ventricle: LVEF is approximately 40% with akinesis of the   distal inferoseptal wall and apex. Aneurysmal dilatation of apex.  Consider limited echo with contrast to evaluate for thrombus. The   cavity size was normal. Wall thickness was increased in a pattern   of mild LVH. - Mitral valve: Calcified annulus. Mildly thickened leaflets .   Patient Profile     66 y.o. male with hx of CAD (on medication management for three-vessel disease not amendable to PCI), chronic combined congestive heart failure, COPD (on home oxygen), hypertension, hyperlipidemia, obesity, uncontrolled type 2 diabetes, chronic kidney disease stage III, and tobacco use. Presented to [facility] 11/15 with 3 days of intermittent chest pain and dyspnea and was found to have troponin 20.79, late presenting acute inferior lateral ST elevation myocardial infarction and was in atrial fibrillation he was made code STEMI and transferred to [place]    STEMI - Went for cath yesterday and was found to have severe three-vessel disease not amendable to PCI. He is now chest pain free off of the nitroglycerine drip. We will focus on medical management for this coronary artery disease. Will need further focus and discussion on whether he is a surgical candidate.  Currently he is on aspirin, Plavix, atorvastatin 40, metoprolol tartrate 12.5 mg BID. Nitro drip is titrated off, will start isosorbide mononitrate today. Not adequately beta blocked, will increase po metoprolol dose today. Not safe to start ACE/ARB at this time given AKI, if this  persist could add on HCTZ instead.    Acute on chronic combined CHF - Signs of volume overload on exam with acute STEMI, will check BNP but will need to hold off on diuresis for now with AKI.   Afib - Now in sinus rhythm with rate controlled in the 80s, on IV amiodarone 360 mg, may be able to discontinue the IV metoprolol   Anemia- Hgb 11.9 managing with p.o. iron supplement, ordered iron panel   Diabetes - A1c 7.9, blood glucose 200s, managing with lantus and novolog   AKI on CKD stage III -Progressively worsening creatinine now with elevated potassium and BUN may have been dehydrated and taking lasix prior to cardiac catheterization. Will avoid nephrotoxic agents and monitor urine output closely.   Hyperkalemia - K 5.3, ordered kayexalate   Obesity - continued encouragement, starting cardiac rehab   Tobacco use - continued encouragement, case management consulted   Hypervolemic hyponatremia - Corrected for hyperglycemia Na is 133 with signs of fluid overload on exam and has had low sodium in the past. Question if this may be related to volume overload so I have ordered osmolality studies, considering BNP.    For questions or updates, please contact Esmer[place]da Please consult [email or web address] for contact info under Cardiology/STEMI.      Signed, Ledell Noss, MD  07/15/2017, 9:11 AM

## 2017-07-15 NOTE — Care Management Note (Addendum)
Case Management Note  Patient Details  Name: Jose [name] MRN: [id number] Date of Birth: March 11, 1951  Subjective/Objective:  From home with wife, s/p heart cath, unsuccessful pci, will txt medically, pta indep, he is a English as a second language teacher with [employer], he gets his medications from [place].  His PCP with the [place] is [facility].   Fax number is [phone].  Wife is interested in getting a w/chair for patient.  NCM will need script to fax to [facility] faxed w/chair order to Dr. Anda Latina today at 1604.     11/20 [place], BSN - volume overload, unsuccessful pci, consult for CVTS, but is poor candidate with high risk of lv apical thrombus. conts on lasix and hep drip.              Action/Plan: NCM will follow for dc needs.   Expected Discharge Date:                  Expected Discharge Plan:  Home/Self Care  In-House Referral:     Discharge planning Services  CM Consult  Post Acute Care Choice:    Choice offered to:     DME Arranged:    DME Agency:     HH Arranged:    HH Agency:     Status of Service:  In process, will continue to follow  If discussed at Long Length of Stay Meetings, dates discussed:    Additional Comments:  Zenon Mayo, RN 07/15/2017, 11:04 AM

## 2017-07-15 NOTE — Progress Notes (Signed)
Results for IVEY, CINA (MRN [id number]) as of 07/15/2017 08:50  Ref. Range 07/14/2017 12:56 07/14/2017 16:52 07/14/2017 19:28 07/14/2017 21:42 07/15/2017 08:02  Glucose-Capillary Latest Ref Range: 65 - 99 mg/dL 204 (H) 239 (H) 244 (H) 205 (H) 227 (H)  Noted that blood sugars continue to be greater than 180 mg/dl.  Recommend adding Novolog SENSITIVE correction scale TID & HS along with the other orders for insulin. Will continue to monitor blood sugars while in the hospital.  Harvel Ricks RN BSN CDE Diabetes Coordinator Pager: [phone]  8am-5pm

## 2017-07-15 NOTE — Progress Notes (Signed)
ANTICOAGULATION CONSULT NOTE - Follow Up Consult  Pharmacy Consult for heparin Indication: atrial fibrillation  Labs: Recent Labs    07/14/17 0922 07/14/17 0937 07/14/17 1559 07/14/17 1929 07/15/17 0228 07/15/17 0235  HGB 12.3* 12.6*  --   --   --  11.9*  HCT 37.3* 37.0*  --   --   --  36.6*  PLT 168  --   --   --   --  187  HEPARINUNFRC  --   --   --   --  <0.10*  --   CREATININE 1.50* 1.50*  --   --   --   --   TROPONINI 20.79*  --  16.59* 17.03*  --   --     Assessment: 66yo male undetectable on heparin with initial dosing for Afib.  Goal of Therapy:  Heparin level 0.3-0.7 units/ml   Plan:  Will increase heparin gtt by 4 units/kgABW/hr to 1800 units/hr and check level in 6hr.  Wynona Neat, PharmD, BCPS  07/15/2017,3:55 AM

## 2017-07-15 NOTE — Progress Notes (Signed)
Progress Note  Has chronic pain syndrome and narcotic dependence.  He would need higher doses of analgesic therapy than typical.  Maintaining sinus rhythm on amiodarone drip.  Hopefully convert to oral therapy in a.m.  I would continue amiodarone indefinitely and perhaps consider discontinuation as an OP depending upon other treatment measures that are instituted.  Creatinine has significantly increased to 2.9.  Overall, I am not optimistic about this patient's opportunity for revascularization.  Medical therapy will be the most likely in result.  Depending upon residual LV function, AICD may be a consideration.  I do not think he is a reasonable surgical candidate.

## 2017-07-15 NOTE — Progress Notes (Signed)
CARDIAC REHAB PHASE I   PRE:  Rate/Rhythm: 86 SR    BP: sitting 124/60    SaO2: 93 4L  MODE:  Ambulation: to recliner (2 ft)   POST:  Rate/Rhythm: 92 SR    BP: sitting      SaO2: 88 4L  Pt on EOB, seemed uncomfortable but denied CP. Willing to tx to recliner. Needed assist x2 (son helped) due to fear of falling and weak legs. Small steps to recliner, able to stand upright holding son's hand. Very exerted, SOB. Main c/o is lower back pain. He admits to only walking a small amount at home, mostly watching tv. Also he hasn't moved much at all in the last few days. Gave pt and family MI book. He will need PT for strengthening. Will f/u for more education as plan develops and possible ambulation along with PT. [id number]   [place], ACSM 07/15/2017 2:53 PM

## 2017-07-15 NOTE — Progress Notes (Signed)
[place] MD paged regarding patient's evening CBG of 77 with a scheduled PM dose of 60 units of Lantus. Orders received to only give 30 units of Lantus this evening, and dose can be adjusted in AM if needed.  Will carry out orders.  Inis Sizer

## 2017-07-15 NOTE — Progress Notes (Signed)
Nutrition Brief Note  Consult received for pt who has PMH of CAD, CHF, COPD (home O2), HTN, obesity, uncontrolled DM, CKD stage III, hyperlipidemia. Admitted for STEMI s/p cath with severe three-vessel disease not amendable to PCI. Plan for medical management.  Wife and son in room. Wife and pt provide hx. They report that pt has been following a lower sodium diet and has had his best A1C yet. He has lost weight. He is eating 3 meals per day but less processed food. Wife is cooking more at home and is using low sodium foods.  Pt's appetite is less today, but is improving since admission. Pt is tired.  Reviewed guidelines for DM/low sodium diet. Questions answered. Wife is taking care of all PO intake. She feels comfortable with what she is doing at home.   Wt Readings from Last 15 Encounters:  07/14/17 295 lb (133.8 kg)  06/02/16 293 lb (132.9 kg)  05/24/16 294 lb (133.4 kg)  04/01/16 288 lb (130.6 kg)  03/17/16 292 lb 8.8 oz (132.7 kg)  03/03/16 (!) 308 lb (139.7 kg)  02/27/16 (!) 315 lb 12.8 oz (143.2 kg)  08/29/14 295 lb 6.7 oz (134 kg)    Body mass index is 38.92 kg/m. Patient meets criteria for obesity class III based on current BMI.   Current diet order is CHO Modified, patient is consuming approximately > 50% of meals at this time. Labs and medications reviewed.   No nutrition interventions warranted at this time. If nutrition issues arise, please consult RD.   [place], [place], [place] Pager [phone] After Hours Pager

## 2017-07-15 NOTE — Progress Notes (Signed)
ANTICOAGULATION CONSULT NOTE - Follow Up Consult  Pharmacy Consult for heparin  Indication: afib  Allergies  Allergen Reactions  . Simvastatin     Muscle weakness    Patient Measurements: Height: 6\' 1"  (185.4 cm) Weight: 295 lb (133.8 kg) IBW/kg (Calculated) : 79.9 Heparin Dosing Weight: 110kg  Vital Signs: Temp: 98.4 F (36.9 C) (11/16 1947) Temp Source: Oral (11/16 1947) BP: 141/77 (11/16 2206) Pulse Rate: 78 (11/16 2206)  Labs: Recent Labs    07/14/17 0922 07/14/17 0937  07/14/17 1929 07/15/17 0228 07/15/17 0235 07/15/17 1032 07/15/17 1036 07/15/17 1539 07/15/17 2042  HGB 12.3* 12.6*  --   --   --  11.9*  --   --   --   --   HCT 37.3* 37.0*  --   --   --  36.6*  --   --   --   --   PLT 168  --   --   --   --  187  --   --   --   --   HEPARINUNFRC  --   --   --   --  <0.10*  --  <0.10*  --   --  0.20*  CREATININE 1.50* 1.50*  --   --   --  2.59*  --   --  2.90*  --   TROPONINI 20.79*  --    < > 17.03*  --  22.73*  --  18.41*  --   --    < > = values in this interval not displayed.    Estimated Creatinine Clearance: 36 mL/min (A) (by C-G formula based on SCr of 2.9 mg/dL (H)).  Assessment: 66 yo male s/p cath with 3VCAD and afib on heparin. Plans are medical management vs surgery (he is currently on plavix).  Heparin level remains subtherapeutic at 0.20 after increase to 2200 units/hr.  SCr is trending up at 2.90. H/H stable. Platelets 187. No bleeding reported.  No issues with infusion.   Goal of Therapy:  Heparin level 0.3-0.7 units/ml Monitor platelets by anticoagulation protocol: Yes   Plan:  Heparin bolus 3000 units IV x1. -Increase heparin to 2500 units/hr -Heparin level in 8 hours and daily wth CBC daily -Will follow procedural plans  Sloan Leiter, PharmD, BCPS, BCCCP Clinical Pharmacist Clinical phone 07/15/2017 until 11PM - #[phone] After hours, please call #[phone]  07/15/2017 10:09 PM

## 2017-07-15 NOTE — Progress Notes (Signed)
Placed pt on cpap for the night (home unit). Pt tolerating well at this time. RT will continue to monitor.

## 2017-07-15 NOTE — Progress Notes (Signed)
Bladder scanned patient. 56 ml in bladder. Gae Bon, MD notified. Will continue to monitor.  Lucius Conn, RN

## 2017-07-15 NOTE — Consult Note (Signed)
Jose [name] Admit Date: 07/14/2017 07/15/2017 Jose [name] Requesting Physician:  Linard Millers MD  Reason for Consult:  AKI HPI:  66 year old male seen at the request of Dr. Tamala Julian for acute kidney injury.  Patient was admitted yesterday, 11/15 after 3 days of chest pain.  Evaluation at [facility] emergency department identified positive troponin and EKG consistent with STEMI.  He also has atrial fibrillation.  He was transferred to [facility] where he underwent cardiac catheterization demonstrating known chronic coronary disease with a distal left anterior descending obstruction which was culprit.  He was not successful with revascularization and was transferred out of the Cath Lab for medical management.  He received 185 mL of iodinated contrast during the procedure.  He does not use ACE inhibitors or ARBs.  Denies nonsteroidal use preceding admission.  He denies lower urinary tract symptoms including nocturia, urinary hesitancy, incontinence/dribbling, urinary frequency.  This morning his creatinine was 2.59 from a baseline of 1-1.2 with a potassium of 5.3, bicarbonate 23, BUN of 36.  No current urinalysis but in 2007 he had proteinuria present on 2 different evaluations.  Patient has not been able to produce urine today.  Nursing will be attempting catheterization.  He is receiving systemic heparinization, amiodarone load, and rate control with metoprolol.  PMH Incudes:  DM2 on metformin at home  COPD on home O2  Former Smoker x45y  HTN on MTP and lasix  OSA   Creat (mg/dL)  Date Value  05/07/2016 0.96  04/08/2016 1.29 (H)   Creatinine, Ser (mg/dL)  Date Value  07/15/2017 2.59 (H)  07/14/2017 1.50 (H)  07/14/2017 1.50 (H)  04/01/2016 1.42 (H)  03/23/2016 1.23  03/22/2016 1.21  03/21/2016 1.15  03/20/2016 1.09  03/19/2016 0.96  03/18/2016 1.30 (H)  ] I/Os: I/O last 3 completed shifts: In: 1549.7 [P.O.:440; I.V.:1109.7] Out: 650 [Urine:650]   ROS Balance of 12  systems is negative w/ exceptions as above  PMH  Past Medical History:  Diagnosis Date  . CAD (coronary artery disease)    a. prior silent inferior MI, NSTEMI 07/2014 with chronically occluded RCA s/p unsuccessful PCI with otherwise nonobstructive residual disease  . Chronic respiratory failure ([place])    a. Placed on home O2 01/2016.  [name] Kitchen COPD (chronic obstructive pulmonary disease) ([place])   . Diabetes mellitus ([place])   . Former tobacco use   . Hyperlipidemia   . Hypertension   . Morbid obesity ([place])   . Neuropathy   . Sleep apnea    PSH  Past Surgical History:  Procedure Laterality Date  . BACK SURGERY    . Carotid Angiography Bilateral 03/22/2016   Performed by Elam Dutch, MD at [place] [facility]  . CORONARY BALLOON ANGIOPLASTY N/A 07/14/2017   Performed by Martinique, Peter M, MD at [place] [facility]  . Intravascular Ultrasound/IVUS N/A 07/14/2017   Performed by Martinique, Peter M, MD at [place] [facility]  . LEFT HEART CATH AND CORONARY ANGIOGRAPHY N/A 07/14/2017   Performed by Martinique, Peter M, MD at [place] [facility]  . LEFT HEART CATHETERIZATION WITH CORONARY ANGIOGRAM N/A 08/28/2014   Performed by Charolette Forward, MD at [facility] CATH LAB   [place]  Family History  Problem Relation Age of Onset  . Hypertension Father   . Heart disease Father    [facility]  reports that he has quit smoking. His smoking use included cigarettes. He has a 135.00 pack-year smoking history. he has never used smokeless tobacco. He reports  that he does not drink alcohol or use drugs. Allergies  Allergies  Allergen Reactions  . Simvastatin     Muscle weakness   Home medications Prior to Admission medications   Medication Sig Start Date End Date Taking? Authorizing Provider  aspirin 81 MG tablet Take 81 mg by mouth daily.   Yes [provider]  clopidogrel (PLAVIX) 75 MG tablet Take 1 tablet (75 mg total) by mouth daily. 03/23/16  Yes Donzetta Starch, NP  ergocalciferol (VITAMIN D2) 50000 UNITS capsule  Take 50,000 Units by mouth See admin instructions. Take one twice a week on wednesdays and saturdays   Yes [provider]  ferrous sulfate 325 (65 FE) MG tablet Take 325 mg by mouth 3 (three) times daily with meals.   Yes [provider]  furosemide (LASIX) 40 MG tablet Take 40 mg by mouth 2 (two) times daily.   Yes [provider]  glipiZIDE (GLUCOTROL) 5 MG tablet Take 5 mg 2 (two) times daily by mouth. 10mg  in the morning, 5mg  in the afternoon, 5mg  in the evening   Yes [provider]  insulin aspart (NOVOLOG) 100 UNIT/ML injection Inject 30 Units into the skin 3 (three) times daily before meals.    Yes [provider]  insulin glargine (LANTUS) 100 UNIT/ML injection Inject 0.3-0.6 mLs (30-60 Units total) into the skin See admin instructions. 50 units in the AM and 60 units in the PM Patient taking differently: Inject 45-60 Units See admin instructions into the skin. 45 units in the AM and 60 units in the PM 03/23/16  Yes Biby, Massie Kluver, NP  magnesium oxide (MAG-OX) 400 MG tablet Take 1 tablet (400 mg total) by mouth daily. 03/04/16  Yes Branch, Alphonse Guild, MD  metFORMIN (GLUCOPHAGE) 1000 MG tablet Take 1 tablet (1,000 mg total) by mouth 2 (two) times daily with a meal. 08/30/14  Yes Charolette Forward, MD  metoprolol tartrate (LOPRESSOR) 25 MG tablet Take 0.5 tablets (12.5 mg total) by mouth 2 (two) times daily. 03/23/16  Yes Donzetta Starch, NP  Multiple Vitamins-Minerals (ICAPS AREDS 2 PO) Take 1 capsule by mouth 2 (two) times daily.    Yes [provider]  mupirocin cream (BACTROBAN) 2 % Apply topically daily. Patient taking differently: Apply topically daily as needed.  03/23/16  Yes Donzetta Starch, NP  nitroGLYCERIN (NITROSTAT) 0.4 MG SL tablet Place 1 tablet (0.4 mg total) under the tongue every 5 (five) minutes x 3 doses as needed for chest pain. 08/29/14  Yes Charolette Forward, MD  Omega-3 Fatty Acids (FISH OIL) 1000 MG CAPS Take 2,000-3,000 mg See  admin instructions by mouth. Take 2000 mg every morning Take 3000 mg at noon and evening.   Yes [provider]  oxyCODONE-acetaminophen (PERCOCET/ROXICET) 5-325 MG per tablet Take 2 tablets by mouth every 4 (four) hours. Take eight tablets daily as needed for pain per family and list. For bad  Back pain   Yes [provider]  sertraline (ZOLOFT) 100 MG tablet Take 100 mg by mouth daily.   Yes [provider]  tiotropium (SPIRIVA) 18 MCG inhalation capsule Place 18 mcg into inhaler and inhale at bedtime.    Yes [provider]  vitamin B-12 (CYANOCOBALAMIN) 1000 MCG tablet Take 1,000 mcg by mouth daily.   Yes [provider]  pravastatin (PRAVACHOL) 20 MG tablet Take 1 tablet (20 mg total) by mouth once a week. 06/02/16 08/31/16  Arnoldo Lenis, MD    Current Medications  Scheduled Meds: . aspirin EC  81 mg Oral Daily  . atorvastatin  40 mg Oral q1800  . chlorhexidine  15 mL Mouth Rinse BID  . clopidogrel  75 mg Oral Daily  . ferrous sulfate  325 mg Oral TID WC  . Influenza vac split quadrivalent PF  0.5 mL Intramuscular Tomorrow-1000  . insulin aspart  0-5 Units Subcutaneous QHS  . insulin aspart  0-9 Units Subcutaneous TID WC  . insulin aspart  30 Units Subcutaneous TID AC  . insulin glargine  50 Units Subcutaneous Daily  . insulin glargine  60 Units Subcutaneous QHS  . isosorbide mononitrate  30 mg Oral Daily  . magnesium oxide  400 mg Oral Daily  . mouth rinse  15 mL Mouth Rinse q12n4p  . metoprolol tartrate  25 mg Oral BID  . omega-3 acid ethyl esters  2 g Oral Q breakfast  . omega-3 acid ethyl esters  3 g Oral BID AC  . sertraline  100 mg Oral Daily  . sodium chloride flush  3 mL Intravenous Q12H  . tiotropium  18 mcg Inhalation QHS   Continuous Infusions: . sodium chloride    . amiodarone 30 mg/hr (07/15/17 0939)  . heparin 2,200 Units/hr (07/15/17 1251)   PRN Meds:.sodium chloride, acetaminophen, morphine injection,  nitroGLYCERIN, ondansetron (ZOFRAN) IV, oxyCODONE-acetaminophen, sodium chloride flush  CBC Recent Labs  Lab 07/14/17 0922 07/14/17 0937 07/15/17 0235  WBC 13.1*  --  15.5*  HGB 12.3* 12.6* 11.9*  HCT 37.3* 37.0* 36.6*  MCV 88.4  --  86.9  PLT 168  --  030   Basic Metabolic Panel Recent Labs  Lab 07/14/17 0922 07/14/17 0937 07/15/17 0235  NA 130* 130* 130*  K 4.1 4.6 5.3*  CL 91* 94* 93*  CO2 28  --  23  GLUCOSE 234* 219* 229*  BUN 29* 36* 36*  CREATININE 1.50* 1.50* 2.59*  CALCIUM 8.6*  --  8.2*    Physical Exam  Blood pressure (!) 153/83, pulse 95, temperature 97.7 F (36.5 C), temperature source Oral, resp. rate (!) 22, height 6\' 1"  (1.854 m), weight 133.8 kg (295 lb), SpO2 93 %. GEN: obese, in chair, nad ENT: NCAT EYES: EOMI CV: RRR, nl s1s2 no rub PULM: Bibasilar crackles, nl wob, distant BS ABD: s/nt/nd SKIN: No rashes/lesions EXT:3+ LEE with chronic venous stasis changes   Assessment 59M with AKI (BL SCr 1-1.2) after LHC for STEMI 07/14/17  1. AKI 2/2 contrast nephropathy, no UOP today 2. STEMI not able to have successful PCI, prev severe CAD 3. AFib, on amio 4. HTN 5. DM2 6. COPD 7. OSA  Plan 1. Agree with I&O cath, if > 0.5L obtained would place foley 2. Renal US 3. UA + UP/C 4. Daily weights, Daily Renal Panel, Strict I/Os, Avoid nephrotoxins (NSAIDs, judicious IV Contrast)  5. Will cont to closely follow.  Hopefully he will stabilize and improve over time.  No dialysis needs at the current time.   Pearson Grippe MD [phone] pgr 07/15/2017, 4:09 PM

## 2017-07-15 NOTE — Progress Notes (Signed)
ANTICOAGULATION CONSULT NOTE - Follow Up Consult  Pharmacy Consult for heparin  Indication: afib  Allergies  Allergen Reactions  . Simvastatin     Muscle weakness    Patient Measurements: Height: 6\' 1"  (185.4 cm) Weight: 295 lb (133.8 kg) IBW/kg (Calculated) : 79.9 Heparin Dosing Weight: 110kg  Vital Signs: Temp: 97.7 F (36.5 C) (11/16 1232) Temp Source: Oral (11/16 1232) BP: 134/91 (11/16 1232) Pulse Rate: 81 (11/16 1232)  Labs: Recent Labs    07/14/17 0922 07/14/17 0937  07/14/17 1929 07/15/17 0228 07/15/17 0235 07/15/17 1032 07/15/17 1036  HGB 12.3* 12.6*  --   --   --  11.9*  --   --   HCT 37.3* 37.0*  --   --   --  36.6*  --   --   PLT 168  --   --   --   --  187  --   --   HEPARINUNFRC  --   --   --   --  <0.10*  --  <0.10*  --   CREATININE 1.50* 1.50*  --   --   --  2.59*  --   --   TROPONINI 20.79*  --    < > 17.03*  --  22.73*  --  18.41*   < > = values in this interval not displayed.    Estimated Creatinine Clearance: 40.3 mL/min (A) (by C-G formula based on SCr of 2.59 mg/dL (H)).  Assessment: 66 yo male s/p cath with 3VCAD and afib on heparin. Plans are medical management vs surgery (he is currently on plavix) -heparin level < 0.1 on 1800 units/hr  Goal of Therapy:  Heparin level 0.3-0.7 units/ml Monitor platelets by anticoagulation protocol: Yes   Plan:  -Increase heparin to 2200 units/hr -Heparin level in 8 hours and daily wth CBC daily -Will follow procedural plans  Hildred Laser, Pharm D 07/15/2017 12:46 PM

## 2017-07-16 DIAGNOSIS — I10 Essential (primary) hypertension: Secondary | ICD-10-CM

## 2017-07-16 DIAGNOSIS — E871 Hypo-osmolality and hyponatremia: Secondary | ICD-10-CM

## 2017-07-16 DIAGNOSIS — I5043 Acute on chronic combined systolic (congestive) and diastolic (congestive) heart failure: Secondary | ICD-10-CM

## 2017-07-16 DIAGNOSIS — E1122 Type 2 diabetes mellitus with diabetic chronic kidney disease: Secondary | ICD-10-CM

## 2017-07-16 DIAGNOSIS — N183 Chronic kidney disease, stage 3 (moderate): Secondary | ICD-10-CM

## 2017-07-16 LAB — CBC
HCT: 32 % — ABNORMAL LOW (ref 39.0–52.0)
Hemoglobin: 10.2 g/dL — ABNORMAL LOW (ref 13.0–17.0)
MCH: 27.6 pg (ref 26.0–34.0)
MCHC: 31.9 g/dL (ref 30.0–36.0)
MCV: 86.7 fL (ref 78.0–100.0)
Platelets: 213 10*3/uL (ref 150–400)
RBC: 3.69 MIL/uL — ABNORMAL LOW (ref 4.22–5.81)
RDW: 15.2 % (ref 11.5–15.5)
WBC: 14.6 10*3/uL — ABNORMAL HIGH (ref 4.0–10.5)

## 2017-07-16 LAB — BASIC METABOLIC PANEL
Anion gap: 13 (ref 5–15)
BUN: 48 mg/dL — AB (ref 6–20)
CHLORIDE: 89 mmol/L — AB (ref 101–111)
CO2: 27 mmol/L (ref 22–32)
CREATININE: 2.74 mg/dL — AB (ref 0.61–1.24)
Calcium: 8 mg/dL — ABNORMAL LOW (ref 8.9–10.3)
GFR calc Af Amer: 26 mL/min — ABNORMAL LOW (ref >60)
GFR calc non Af Amer: 23 mL/min — ABNORMAL LOW (ref >60)
GLUCOSE: 69 mg/dL (ref 65–99)
Potassium: 4 mmol/L (ref 3.5–5.1)
SODIUM: 129 mmol/L — AB (ref 135–145)

## 2017-07-16 LAB — GLUCOSE, CAPILLARY
GLUCOSE-CAPILLARY: 70 mg/dL (ref 65–99)
GLUCOSE-CAPILLARY: 158 mg/dL — AB (ref 65–99)
GLUCOSE-CAPILLARY: 152 mg/dL — AB (ref 65–99)
Glucose-Capillary: 188 mg/dL — ABNORMAL HIGH (ref 65–99)

## 2017-07-16 LAB — HEPARIN LEVEL (UNFRACTIONATED): HEPARIN UNFRACTIONATED: 0.68 [IU]/mL (ref 0.30–0.70)

## 2017-07-16 MED ORDER — INSULIN GLARGINE 100 UNIT/ML ~~LOC~~ SOLN
30.0000 [IU] | Freq: Every day | SUBCUTANEOUS | Status: DC
Start: 2017-07-16 — End: 2017-07-21
  Administered 2017-07-16 – 2017-07-20 (×5): 30 [IU] via SUBCUTANEOUS
  Filled 2017-07-16 (×7): qty 0.3

## 2017-07-16 MED ORDER — AMIODARONE HCL 200 MG PO TABS
200.0000 mg | ORAL_TABLET | Freq: Two times a day (BID) | ORAL | Status: DC
  Administered 2017-07-16 – 2017-08-03 (×38): 200 mg via ORAL
  Filled 2017-07-16 (×38): qty 1

## 2017-07-16 MED ORDER — INSULIN GLARGINE 100 UNIT/ML ~~LOC~~ SOLN
25.0000 [IU] | Freq: Every day | SUBCUTANEOUS | Status: DC
  Administered 2017-07-16 – 2017-07-21 (×6): 25 [IU] via SUBCUTANEOUS
  Filled 2017-07-16 (×6): qty 0.25

## 2017-07-16 NOTE — Progress Notes (Signed)
Admit: 07/14/2017 LOS: 2  40M with AKI (BL SCr 1-1.2) after LHC for STEMI 07/14/17  Subjective:  No c/o, feels well. No CP I&O Cath 0.4L UOP yest Minimal UOP from pt since  SCr down this AM from yest PM  11/16 0701 - 11/17 0700 In: 1261.2 [P.O.:300; I.V.:961.2] Out: 400 [Urine:400]  Filed Weights   07/14/17 0910 07/16/17 0600  Weight: 133.8 kg (295 lb) (!) 138.3 kg (304 lb 14.3 oz)    Scheduled Meds: . aspirin EC  81 mg Oral Daily  . atorvastatin  40 mg Oral q1800  . chlorhexidine  15 mL Mouth Rinse BID  . clopidogrel  75 mg Oral Daily  . ferrous sulfate  325 mg Oral TID WC  . Influenza vac split quadrivalent PF  0.5 mL Intramuscular Tomorrow-1000  . insulin aspart  0-5 Units Subcutaneous QHS  . insulin aspart  0-9 Units Subcutaneous TID WC  . insulin aspart  30 Units Subcutaneous TID AC  . insulin glargine  50 Units Subcutaneous Daily  . insulin glargine  60 Units Subcutaneous QHS  . isosorbide mononitrate  30 mg Oral Daily  . magnesium oxide  400 mg Oral Daily  . mouth rinse  15 mL Mouth Rinse q12n4p  . metoprolol tartrate  25 mg Oral BID  . omega-3 acid ethyl esters  2 g Oral Q breakfast  . omega-3 acid ethyl esters  3 g Oral BID AC  . sertraline  100 mg Oral Daily  . sodium chloride flush  3 mL Intravenous Q12H  . tiotropium  18 mcg Inhalation QHS   Continuous Infusions: . sodium chloride    . amiodarone 30 mg/hr (07/16/17 0700)  . heparin 2,500 Units/hr (07/16/17 0700)   PRN Meds:.sodium chloride, acetaminophen, morphine injection, nitroGLYCERIN, ondansetron (ZOFRAN) IV, oxyCODONE-acetaminophen, sodium chloride flush  Current Labs: reviewed  11/16 U Na < 10 11/16 UP/C 4.0 11/15 Renal US: R 13.5cm and L 14.3cm, inc echgenicity b/l; no obstruction   Physical Exam:  Blood pressure 127/73, pulse 70, temperature 98 F (36.7 C), temperature source Axillary, resp. rate 16, height 6\' 1"  (1.854 m), weight (!) 138.3 kg (304 lb 14.3 oz), SpO2 96 %. GEN: obese, in  chair, nad ENT: NCAT EYES: EOMI CV: RRR, nl s1s2 no rub PULM: Bibasilar crackles, nl wob, distant BS ABD: s/nt/nd SKIN: No rashes/lesions EXT:3+ LEE with chronic venous stasis changes  A 1. Oliguric AKI 2/2 contrast nephropathy, BL SCr 1-1.2; low UNa might be contrast related; neg acute issues on renal US 2. Nephrotic proteinuria, suspect is diabetic related 3. STEMI not able to have successful PCI, prev severe CAD 4. AFib, on amio now in NSR, on hep systemic 5. HTN 6. DM2 7. COPD 8. OSA 9. Hypervolemic, hyponatremia, mild  Plan 1. Would I&O again today, if similar or greater UOP place foley 2. Daily weights, Daily Renal Panel, Strict I/Os, Avoid nephrotoxins (NSAIDs, judicious IV Contrast)  3. Will cont to closely follow.  Hopefully he will stabilize and improve over time.  No dialysis needs at the current time   Pearson Grippe MD 07/16/2017, 8:02 AM  Recent Labs  Lab 07/15/17 0235 07/15/17 1539 07/16/17 0559  NA 130* 130* 129*  K 5.3* 4.5 4.0  CL 93* 92* 89*  CO2 23 26 27   GLUCOSE 229* 96 69  BUN 36* 44* 48*  CREATININE 2.59* 2.90* 2.74*  CALCIUM 8.2* 8.2* 8.0*   Recent Labs  Lab 07/14/17 0922 07/14/17 0937 07/15/17 0235 07/16/17 0233  WBC 13.1*  --  15.5* 14.6*  HGB 12.3* 12.6* 11.9* 10.2*  HCT 37.3* 37.0* 36.6* 32.0*  MCV 88.4  --  86.9 86.7  PLT 168  --  187 213

## 2017-07-16 NOTE — Progress Notes (Signed)
ANTICOAGULATION CONSULT NOTE - Follow Up Consult  Pharmacy Consult for heparin  Indication: afib, high risk for LV thrombus  Allergies  Allergen Reactions  . Simvastatin     Muscle weakness    Patient Measurements: Height: 6\' 1"  (185.4 cm) Weight: (!) 304 lb 14.3 oz (138.3 kg) IBW/kg (Calculated) : 79.9 Heparin Dosing Weight: 110kg  Vital Signs: Temp: 98.2 F (36.8 C) (11/17 1146) Temp Source: Oral (11/17 1146) BP: 102/67 (11/17 1100) Pulse Rate: 65 (11/17 1100)  Labs: Recent Labs    07/14/17 0922 07/14/17 0937  07/14/17 1929  07/15/17 0235 07/15/17 1032 07/15/17 1036 07/15/17 1539 07/15/17 2042 07/16/17 0233 07/16/17 0559  HGB 12.3* 12.6*  --   --   --  11.9*  --   --   --   --  10.2*  --   HCT 37.3* 37.0*  --   --   --  36.6*  --   --   --   --  32.0*  --   PLT 168  --   --   --   --  187  --   --   --   --  213  --   HEPARINUNFRC  --   --   --   --    < >  --  <0.10*  --   --  0.20*  --  0.68  CREATININE 1.50* 1.50*  --   --   --  2.59*  --   --  2.90*  --   --  2.74*  TROPONINI 20.79*  --    < > 17.03*  --  22.73*  --  18.41*  --   --   --   --    < > = values in this interval not displayed.    Estimated Creatinine Clearance: 38.7 mL/min (A) (by C-G formula based on SCr of 2.74 mg/dL (H)).  Assessment: 66 yo male s/p cath with 3VCAD and afib on heparin.   Heparin continues today as renal function is followed closely, plan to start oral anticoagulation of DOAC vs warfarin depending on outcome.   Heparin level at upper end of goal this am, will check confirmatory level this evening.  Goal of Therapy:  Heparin level 0.3-0.7 units/ml Monitor platelets by anticoagulation protocol: Yes   Plan:  Heparin at 2500 units/hr -Heparin level daily wth CBC daily  Erin Hearing PharmD., BCPS Clinical Pharmacist Pager [phone] 07/16/2017 2:25 PM

## 2017-07-16 NOTE — Progress Notes (Addendum)
PT Cancellation Note  Patient Details Name: Jose [name] MRN: [id number] DOB: March 03, 1951   Cancelled Treatment:    Reason Eval/Treat Not Completed: Patient not medically ready.  Patient remains on bedrest per orders.   **MD:  Please d/c bedrest orders when appropriate for patient.  PT will initiate evaluation at that time.  Thank you!  Spoke with Marita [place], Therapist, sports.  Patient unable to tolerate PT at this time due to medical issues, not tolerating mobility.  Will return tomorrow if appropriate for patient and activity orders increased.  Thank you.   Despina Pole 07/16/2017, 11:51 AM Carita Pian. Sanjuana Kava, [place] Pager [phone]

## 2017-07-16 NOTE — Progress Notes (Signed)
Progress Note  Patient Name: Jose [name] Date of Encounter: 07/16/2017  Primary Cardiologist: Harl Bowie  Subjective   Denies angina or dyspnea.  He does have difficult urination.  Yesterday had 400 mL residual.  +1.8 L since admission. Peripheral edema.  Creatinine 2.74, essentially unchanged over the last 3 days.  Baseline around 1.5. ECG this morning with persistent extensive ST elevation in V4-V6 and inferiorly. Morning glucose 69, despite receiving only half previously prescribed dose of Lantus last night  Inpatient Medications    Scheduled Meds: . aspirin EC  81 mg Oral Daily  . atorvastatin  40 mg Oral q1800  . chlorhexidine  15 mL Mouth Rinse BID  . clopidogrel  75 mg Oral Daily  . ferrous sulfate  325 mg Oral TID WC  . Influenza vac split quadrivalent PF  0.5 mL Intramuscular Tomorrow-1000  . insulin aspart  0-5 Units Subcutaneous QHS  . insulin aspart  0-9 Units Subcutaneous TID WC  . insulin aspart  30 Units Subcutaneous TID AC  . insulin glargine  50 Units Subcutaneous Daily  . insulin glargine  60 Units Subcutaneous QHS  . isosorbide mononitrate  30 mg Oral Daily  . magnesium oxide  400 mg Oral Daily  . mouth rinse  15 mL Mouth Rinse q12n4p  . metoprolol tartrate  25 mg Oral BID  . omega-3 acid ethyl esters  2 g Oral Q breakfast  . omega-3 acid ethyl esters  3 g Oral BID AC  . sertraline  100 mg Oral Daily  . sodium chloride flush  3 mL Intravenous Q12H  . tiotropium  18 mcg Inhalation QHS   Continuous Infusions: . sodium chloride    . amiodarone 30 mg/hr (07/16/17 0700)  . heparin 2,500 Units/hr (07/16/17 0700)   PRN Meds: sodium chloride, acetaminophen, morphine injection, nitroGLYCERIN, ondansetron (ZOFRAN) IV, oxyCODONE-acetaminophen, sodium chloride flush   Vital Signs    Vitals:   07/16/17 0400 07/16/17 0500 07/16/17 0600 07/16/17 0700  BP: 128/75 127/75 (!) 139/105 127/73  Pulse: 65 61 77 70  Resp: 11 11 (!) 21 16  Temp:      TempSrc:        SpO2: 93% 93% 100% 96%  Weight:   (!) 304 lb 14.3 oz (138.3 kg)   Height:        Intake/Output Summary (Last 24 hours) at 07/16/2017 0815 Last data filed at 07/16/2017 0700 Gross per 24 hour  Intake 1091.83 ml  Output 400 ml  Net 691.83 ml   Filed Weights   07/14/17 0910 07/16/17 0600  Weight: 295 lb (133.8 kg) (!) 304 lb 14.3 oz (138.3 kg)    Telemetry    Sinus rhythm- Personally Reviewed  ECG    Sinus rhythm, ST elevation in inferior leads and V4-V6- Personally Reviewed  Physical Exam  Severely obese, limiting exam GEN: No acute distress.   Neck:  Hard to see JVP Cardiac: RRR, no murmurs, rubs, or gallops.  Respiratory: Clear to auscultation bilaterally. GI: Soft, nontender, non-distended  MS:  Symmetrical 2+ pedal edema; No deformity. Neuro:  Nonfocal  Psych: Normal affect   Labs    Chemistry Recent Labs  Lab 07/15/17 0235 07/15/17 1539 07/16/17 0559  NA 130* 130* 129*  K 5.3* 4.5 4.0  CL 93* 92* 89*  CO2 23 26 27   GLUCOSE 229* 96 69  BUN 36* 44* 48*  CREATININE 2.59* 2.90* 2.74*  CALCIUM 8.2* 8.2* 8.0*  GFRNONAA 24* 21* 23*  GFRAA 28* 24* 26*  [facility] 14 12 13      Hematology Recent Labs  Lab 07/14/17 [phone] 07/14/17 0937 07/15/17 0235 07/16/17 0233  WBC 13.1*  --  15.5* 14.6*  RBC 4.22  --  4.21* 3.69*  HGB 12.3* 12.6* 11.9* 10.2*  HCT 37.3* 37.0* 36.6* 32.0*  MCV 88.4  --  86.9 86.7  MCH 29.1  --  28.3 27.6  MCHC 33.0  --  32.5 31.9  RDW 15.0  --  15.2 15.2  PLT 168  --  187 213    Cardiac Enzymes Recent Labs  Lab 07/14/17 1559 07/14/17 1929 07/15/17 0235 07/15/17 1036  TROPONINI 16.59* 17.03* 22.73* 18.41*    Recent Labs  Lab 07/14/17 0931  TROPIPOC 18.10*     BNP Recent Labs  Lab 07/15/17 0235  BNP 258.5*     DDimer No results for input(s): DDIMER in the last 168 hours.   Radiology    US Renal  Result Date: 07/15/2017 CLINICAL DATA:  Initial evaluation for acute kidney injury. History of diabetes,  hypertension. Obesity. EXAM: RENAL / URINARY TRACT ULTRASOUND COMPLETE COMPARISON:  None. FINDINGS: Right Kidney: Length: 13.5 cm. Right kidney demonstrates a somewhat lobulated contour. Diffusely increased echogenicity, suggesting medical renal disease. No mass or hydronephrosis visualized. Trace free perinephric fluid noted. Left Kidney: Length: 14.3 cm. Left kidney demonstrates a lobulated contour. Diffusely increased echogenicity, suggesting medical renal disease. No mass or hydronephrosis visualized. Bladder: Appears normal for degree of bladder distention. IMPRESSION: Diffusely increased echogenicity, suggesting medical renal disease. No hydronephrosis. Electronically Signed   By: Jeannine Boga M.D.   On: 07/15/2017 21:23    Cardiac Studies   Cardiac cath July 14, 2017  -unsuccessful attempted PTCA of occluded distal LAD, chronic occlusion of RCA Diagnostic Diagram       Post-Intervention Diagram        Echo July 14, 2017 - Left ventricle: LVEF is approximately 40% with akinesis of the   distal inferoseptal wall and apex. Aneurysmal dilatation of apex.   Consider limited echo with contrast to evaluate for thrombus. The   cavity size was normal. Wall thickness was increased in a pattern   of mild LVH. - Mitral valve: Calcified annulus. Mildly thickened leaflets .  Patient Profile     66 y.o. male presenting with extensive anteroapical and inferior infarction, unsuccessful attempt at revascularization of occluded distal LAD, chronic occlusion of RCA.  Moderate reduction in ventricular systolic function.  Atrial fibrillation with rapid ventricular response present on arrival, resolved with amiodarone.  Moderate acute renal failure related to contrast nephrotoxicity, stabilizing.  Long-standing insulin requiring diabetes mellitus, now with mild hypoglycemia in setting of renal insufficiency and reduced oral intake.  Assessment & Plan    1. CAD s/p STEMI: Has frozen  infarct pattern for the development of aneurysm.  Unsuccessful percutaneous revascularization and marginal candidate for surgery.  Continue intravenous heparin for multiple indications (risk of LV apical thrombus, atrial fibrillation, recent STEMI).  When renal function stabilizes, would place on direct oral anticoagulant (warfarin if he maintains poor renal function).  Long-term with plan clopidogrel and anticoagulant, stop aspirin after 30 days.  As his medical status improves will ask for a surgical opinion regarding CABG, but I suspect they will recommend against high risk surgery. 2. CHF: He has clear evidence of hypervolemia by physical exam, but thankfully without pulmonary edema or any respiratory difficulty.  If renal function improves he should be started on RAAS inhibitors.  He is receiving beta-blockers.  Diuretics on hold  until renal function starts to improve. 3. AFib: Stop IV amiodarone and transition to oral amiodarone.  Oral anticoagulation to start based on renal function. CHADSVasc 5 (age, hypertension, diabetes, heart failure, CAD). 4.  Acute renal failure on CKD stage III: Need to monitor his urine output carefully, subtle reduction in creatinine but essentially unchanged for last 3 days.  He is volume overloaded, but not in respiratory difficulty.  We will hopefully see improvement over the next 48 hours.  Avoid RAAS inhibitors for the time being and other nephrotoxic agents. 5. DM: Borderline hypoglycemia this morning despite a reduced dose of Lantus.  We will cut back further stopped the scheduled NovoLog with meals.  Will ask for internal medicine consultation to follow throughout this hospitalization and make recommendations for home insulin therapy. 6.  Urinary retention: Will perform in and out cath.  If he still has high residual will need an indwelling Foley for a while. 7.  Smoking: Strongly recommend permanent smoking 8.  Hyponatremia: Slightly worse today, suspect will improve  with diuresis  For questions or updates, please contact [facility] Please consult [email or web address] for contact info under Cardiology/STEMI.      Signed, Sanda Klein, MD  07/16/2017, 8:15 AM

## 2017-07-16 NOTE — Progress Notes (Signed)
RT found pt already placed on home CPAP unit. RT administered Spiriva and placed pt back on CPAP with 4lpm. Pt tolerating well and sats stable. RT to cont to monitor.

## 2017-07-16 NOTE — Progress Notes (Signed)
EKG CRITICAL VALUE     12 lead EKG performed.  Critical value noted.  Marita Kansas, RN notified.   Marilynne Halsted 07/16/2017 10:04 AM

## 2017-07-16 NOTE — Progress Notes (Signed)
ANTICOAGULATION CONSULT NOTE - Follow Up Consult  Pharmacy Consult for heparin Indication: atrial fibrillation  Labs: Recent Labs    07/14/17 0922 07/14/17 0937  07/14/17 1929  07/15/17 0235 07/15/17 1032 07/15/17 1036 07/15/17 1539 07/15/17 2042 07/16/17 0233 07/16/17 0559  HGB 12.3* 12.6*  --   --   --  11.9*  --   --   --   --  10.2*  --   HCT 37.3* 37.0*  --   --   --  36.6*  --   --   --   --  32.0*  --   PLT 168  --   --   --   --  187  --   --   --   --  213  --   HEPARINUNFRC  --   --   --   --    < >  --  <0.10*  --   --  0.20*  --  0.68  CREATININE 1.50* 1.50*  --   --   --  2.59*  --   --  2.90*  --   --  2.74*  TROPONINI 20.79*  --    < > 17.03*  --  22.73*  --  18.41*  --   --   --   --    < > = values in this interval not displayed.    Assessment/Plan:  66yo male therapeutic on heparin after rate change. Will continue gtt at current rate and confirm stable with additional level.   Wynona Neat, PharmD, BCPS  07/16/2017,7:05 AM

## 2017-07-17 ENCOUNTER — Inpatient Hospital Stay (HOSPITAL_COMMUNITY): Payer: Medicare Other

## 2017-07-17 DIAGNOSIS — N179 Acute kidney failure, unspecified: Secondary | ICD-10-CM

## 2017-07-17 DIAGNOSIS — I213 ST elevation (STEMI) myocardial infarction of unspecified site: Secondary | ICD-10-CM

## 2017-07-17 DIAGNOSIS — R0602 Shortness of breath: Secondary | ICD-10-CM

## 2017-07-17 DIAGNOSIS — R0603 Acute respiratory distress: Secondary | ICD-10-CM

## 2017-07-17 LAB — CBC
HEMATOCRIT: 33 % — AB (ref 39.0–52.0)
HEMOGLOBIN: 10.5 g/dL — AB (ref 13.0–17.0)
MCH: 27.4 pg (ref 26.0–34.0)
MCHC: 31.8 g/dL (ref 30.0–36.0)
MCV: 86.2 fL (ref 78.0–100.0)
Platelets: 227 10*3/uL (ref 150–400)
RBC: 3.83 MIL/uL — ABNORMAL LOW (ref 4.22–5.81)
RDW: 15.1 % (ref 11.5–15.5)
WBC: 10.7 10*3/uL — ABNORMAL HIGH (ref 4.0–10.5)

## 2017-07-17 LAB — BASIC METABOLIC PANEL
Anion gap: 13 (ref 5–15)
BUN: 50 mg/dL — AB (ref 6–20)
CHLORIDE: 87 mmol/L — AB (ref 101–111)
CO2: 26 mmol/L (ref 22–32)
CREATININE: 2.29 mg/dL — AB (ref 0.61–1.24)
Calcium: 7.9 mg/dL — ABNORMAL LOW (ref 8.9–10.3)
GFR calc Af Amer: 33 mL/min — ABNORMAL LOW (ref >60)
GFR calc non Af Amer: 28 mL/min — ABNORMAL LOW (ref >60)
GLUCOSE: 115 mg/dL — AB (ref 65–99)
POTASSIUM: 4 mmol/L (ref 3.5–5.1)
Sodium: 126 mmol/L — ABNORMAL LOW (ref 135–145)

## 2017-07-17 LAB — GLUCOSE, CAPILLARY
GLUCOSE-CAPILLARY: 137 mg/dL — AB (ref 65–99)
Glucose-Capillary: 124 mg/dL — ABNORMAL HIGH (ref 65–99)
Glucose-Capillary: 158 mg/dL — ABNORMAL HIGH (ref 65–99)

## 2017-07-17 LAB — HEPARIN LEVEL (UNFRACTIONATED): Heparin Unfractionated: 0.47 IU/mL (ref 0.30–0.70)

## 2017-07-17 MED ORDER — FUROSEMIDE 10 MG/ML IJ SOLN
80.0000 mg | Freq: Two times a day (BID) | INTRAMUSCULAR | Status: DC
  Administered 2017-07-17 – 2017-07-22 (×12): 80 mg via INTRAVENOUS
  Filled 2017-07-17 (×12): qty 8

## 2017-07-17 MED ORDER — ALBUTEROL SULFATE (2.5 MG/3ML) 0.083% IN NEBU: 2.5000 mg | INHALATION_SOLUTION | RESPIRATORY_TRACT | Status: DC | PRN

## 2017-07-17 NOTE — Progress Notes (Signed)
RT called to bedside to fix pt home CPAP machine. RT added water to chamber and placed pt on home unit with 4 L bled in. Pt tolerating well.

## 2017-07-17 NOTE — Progress Notes (Signed)
PROGRESS NOTE  Jose [name]:[id number] DOB: 02-Aug-1951 DOA: 07/14/2017 PCP: Cleophas Dunker, MD  HPI/Recap of past 24 hours: Jose [name] is a 66 yo male with past medical hx significant for CAD with 3 vessels disease non amendable to PCI who presented on 07/14/17 at [facility] ED with complaints of chest pain. Found to have acute STEMI affecting the infra lateral leads transferred emergently to [facility]. Taken for emergent cath but no return to LAD and chronically occluded RCA. Unclear whether a candidate for CTS. Cardiology following.  Hospital course complicated by new onset A-fib, AKI, dyselectrolytemia, urinary retention. Other medical hx include obesity, type 2 diabetes, remote tobacco use (quit 3 years ago per his wife), COPD, HTN, HLD.  This morning the patient denies chest pain. In the room telemetry monitor shows persistent ST T elevations. Admits to shortness of breath when he eats or talks. Denies any palpitations.  Assessment/Plan: Principal Problem:   STEMI (ST elevation myocardial infarction) ([place]) Active Problems:   COPD (chronic obstructive pulmonary disease) (HCC)   HTN (hypertension)   Diabetes (HCC)   Morbid obesity (HCC)   OSA (obstructive sleep apnea)   Chronic diastolic CHF (congestive heart failure) (HCC)   Atrial fibrillation with rapid ventricular response (HCC)   STEMI involving left anterior descending coronary artery ([place])  CAD s/p STEMI -  -emergent cath 07/14/17 -chest pain free off of the nitroglycerine drip -medical management for this coronary artery disease -aspirin, Plavix, atorvastatin 40, metoprolol tartrate 12.5 mg BID, isosorbide mononitrate, amiodarone -no ACE/ARB at this time given AKI -As renal function improves, next week surgical opinion regarding CABG, but cardio suspects they will recommend against, high risk surgery.   Acute on chronic combined CHF -volume overload on exam with acute STEMI -continue diuresis 80 mg IV BID -monitor  I&Os, daily weight, fluid restriction  HFREF 40% -echo 07/14/17 revealed LVEF is approximately 40% with akinesis of the   distal inferoseptal wall and apex. Aneurysmal dilatation of apex.   Consider limited echo with contrast to evaluate for thrombus  AKI on CKD stage III  -improving -cr  2,2 from 2.74, baseline 1.50 -monitor urine output -avoid nephrotoxic agents -BMP am  Hypervolemic hyponatremia -most likely dilutional  -na+ 126 from 129 -Fluid restriction -monitor urine intake and output - on lasix IV 80 mg BID - nephrology following -bmp am  Urinary retention -Improving, not on indwelling foley cath -continue to monitor urinary output  Normocytic anemia -stable -no sign of obvious bleeding -CBC in am  New onset Afib with rvr -Now in sinus rhythm with rate controlled in the 80s -cardiology following. We appreciate recommendations -Long-term would plan clopidogrel and anticoagulant, stop aspirin after 30 days.   -continue amiodarone 360 mg x 4 weeks then decrease to maintenance dose to 200 mg daily. -CHADSVASC score 5  Anemia - stable - Hgb 11.9 - p.o. iron supplement  Diabetes  - A1c 7.9 - lantus and novolog  - ISS with hypoglycemic protocol  Hyperkalemia  -resolved - kayexalate(K 5.3)  Obesity  - Need weight loss. - regular physical activity when stable and a healthy diet - cardiac rehab  Depression/anxiety - stale - zoloft  Former Tobacco use  - per his wife he quit 3 years ago - Avoid tobacco   Code Status: Full  Family Communication: with his wife at bedside  Disposition Plan: Will stay another midnight to continue heparin drip, close monitoring, improvement of renal function/AKI.    Consultants:  Cardiology  nephrology  Procedures:  Left cardiac cath 07/16/17  Antimicrobials:  None indicated   DVT prophylaxis:  Heparin drip   Objective: Vitals:   07/17/17 0100 07/17/17 0200 07/17/17 0300 07/17/17 0500  BP:  (!) 154/68 137/71 (!) 133/41 139/69  Pulse: 79 70 69 70  Resp: 18 19 15 12   Temp:   (!) 97.5 F (36.4 C)   TempSrc:   Oral   SpO2: 94% 92% (!) 89% 92%  Weight:      Height:        Intake/Output Summary (Last 24 hours) at 07/17/2017 9562 Last data filed at 07/17/2017 0500 Gross per 24 hour  Intake 566.7 ml  Output 2000 ml  Net -1433.3 ml   Filed Weights   07/14/17 0910 07/16/17 0600  Weight: 133.8 kg (295 lb) (!) 138.3 kg (304 lb 14.3 oz)    Exam:   General:  66 yo morbidly obese male sitting up in a chair trying to catch his breath as he is eating and trying to talk simulataneously  Cardiovascular: RRR with no rubs or gallops  Respiratory: Mild rales at bases bilaterally. No wheezes noted  Abdomen: morbidly obese NBS x 4 quadrants, NT, ND  Musculoskeletal: 4+ pitting edema, could not palpate pulse due to severe edema  Skin: echymosis on forearm at site of venous punctures  Psychiatry: Mood is appropriate for condition and setting   Data Reviewed: CBC: Recent Labs  Lab 07/14/17 0922 07/14/17 0937 07/15/17 0235 07/16/17 0233 07/17/17 0248  WBC 13.1*  --  15.5* 14.6* 10.7*  HGB 12.3* 12.6* 11.9* 10.2* 10.5*  HCT 37.3* 37.0* 36.6* 32.0* 33.0*  MCV 88.4  --  86.9 86.7 86.2  PLT 168  --  187 213 130   Basic Metabolic Panel: Recent Labs  Lab 07/14/17 0922 07/14/17 0937 07/15/17 0235 07/15/17 1539 07/16/17 0559 07/17/17 0248  NA 130* 130* 130* 130* 129* 126*  K 4.1 4.6 5.3* 4.5 4.0 4.0  CL 91* 94* 93* 92* 89* 87*  CO2 28  --  23 26 27 26   GLUCOSE 234* 219* 229* 96 69 115*  BUN 29* 36* 36* 44* 48* 50*  CREATININE 1.50* 1.50* 2.59* 2.90* 2.74* 2.29*  CALCIUM 8.6*  --  8.2* 8.2* 8.0* 7.9*   GFR: Estimated Creatinine Clearance: 46.4 mL/min (A) (by C-G formula based on SCr of 2.29 mg/dL (H)). Liver Function Tests: No results for input(s): AST, ALT, ALKPHOS, BILITOT, PROT, ALBUMIN in the last 168 hours. No results for input(s): LIPASE, AMYLASE in the  last 168 hours. No results for input(s): AMMONIA in the last 168 hours. Coagulation Profile: No results for input(s): INR, PROTIME in the last 168 hours. Cardiac Enzymes: Recent Labs  Lab 07/14/17 0922 07/14/17 1559 07/14/17 1929 07/15/17 0235 07/15/17 1036  TROPONINI 20.79* 16.59* 17.03* 22.73* 18.41*   BNP (last 3 results) No results for input(s): PROBNP in the last 8760 hours. HbA1C: Recent Labs    07/15/17 0235  HGBA1C 7.9*   CBG: Recent Labs  Lab 07/15/17 2118 07/16/17 0819 07/16/17 1144 07/16/17 1654 07/16/17 2109  GLUCAP 77 70 158* 152* 188*   Lipid Profile: Recent Labs    07/15/17 0235  CHOL 264*  HDL 37*  LDLCALC 176*  TRIG 255*  CHOLHDL 7.1   Thyroid Function Tests: Recent Labs    07/14/17 1559  TSH 1.615   Anemia Panel: Recent Labs    07/15/17 1036  TIBC 231*  IRON 17*   Urine analysis:    Component Value Date/Time  COLORURINE AMBER (A) 07/15/2017 1651   APPEARANCEUR CLOUDY (A) 07/15/2017 1651   LABSPEC 1.038 (H) 07/15/2017 1651   PHURINE 5.0 07/15/2017 1651   GLUCOSEU 50 (A) 07/15/2017 1651   HGBUR NEGATIVE 07/15/2017 [place] 07/15/2017 1651   KETONESUR NEGATIVE 07/15/2017 1651   PROTEINUR >=300 (A) 07/15/2017 1651   NITRITE NEGATIVE 07/15/2017 1651   LEUKOCYTESUR NEGATIVE 07/15/2017 1651   Sepsis Labs: @LABRCNTIP (procalcitonin:4,lacticidven:4)  ) Recent Results (from the past 240 hour(s))  MRSA PCR Screening     Status: None   Collection Time: 07/14/17 12:13 PM  Result Value Ref Range Status   MRSA by PCR NEGATIVE NEGATIVE Final    Comment:        The GeneXpert MRSA Assay (FDA approved for NASAL specimens only), is one component of a comprehensive MRSA colonization surveillance program. It is not intended to diagnose MRSA infection nor to guide or monitor treatment for MRSA infections.       Studies: No results found.  Scheduled Meds: . amiodarone  200 mg Oral BID  . aspirin EC  81 mg  Oral Daily  . atorvastatin  40 mg Oral q1800  . chlorhexidine  15 mL Mouth Rinse BID  . clopidogrel  75 mg Oral Daily  . ferrous sulfate  325 mg Oral TID WC  . Influenza vac split quadrivalent PF  0.5 mL Intramuscular Tomorrow-1000  . insulin aspart  0-5 Units Subcutaneous QHS  . insulin aspart  0-9 Units Subcutaneous TID WC  . insulin glargine  25 Units Subcutaneous Daily  . insulin glargine  30 Units Subcutaneous QHS  . isosorbide mononitrate  30 mg Oral Daily  . magnesium oxide  400 mg Oral Daily  . mouth rinse  15 mL Mouth Rinse q12n4p  . metoprolol tartrate  25 mg Oral BID  . omega-3 acid ethyl esters  2 g Oral Q breakfast  . omega-3 acid ethyl esters  3 g Oral BID AC  . sertraline  100 mg Oral Daily  . sodium chloride flush  3 mL Intravenous Q12H  . tiotropium  18 mcg Inhalation QHS    Continuous Infusions: . sodium chloride    . heparin 2,500 Units/hr (07/17/17 0500)     LOS: 3 days     Kayleen Memos, MD [facility] Pager [phone]  If 7PM-7AM, please contact night-coverage [email or web address] Password [email or web address] 07/17/2017, 7:12 AM

## 2017-07-17 NOTE — Progress Notes (Signed)
Admit: 07/14/2017 LOS: 3  37M with AKI (BL SCr 1-1.2) after LHC for STEMI 07/14/17  Subjective:  Feels well. 2L UOP yesterday! Downtrending SCr SNa down to 126  11/17 0701 - 11/18 0700 In: 566.7 [I.V.:566.7] Out: 2000 [Urine:2000]  Filed Weights   07/14/17 0910 07/16/17 0600  Weight: 133.8 kg (295 lb) (!) 138.3 kg (304 lb 14.3 oz)    Scheduled Meds: . amiodarone  200 mg Oral BID  . aspirin EC  81 mg Oral Daily  . atorvastatin  40 mg Oral q1800  . chlorhexidine  15 mL Mouth Rinse BID  . clopidogrel  75 mg Oral Daily  . ferrous sulfate  325 mg Oral TID WC  . Influenza vac split quadrivalent PF  0.5 mL Intramuscular Tomorrow-1000  . insulin aspart  0-5 Units Subcutaneous QHS  . insulin aspart  0-9 Units Subcutaneous TID WC  . insulin glargine  25 Units Subcutaneous Daily  . insulin glargine  30 Units Subcutaneous QHS  . isosorbide mononitrate  30 mg Oral Daily  . magnesium oxide  400 mg Oral Daily  . mouth rinse  15 mL Mouth Rinse q12n4p  . metoprolol tartrate  25 mg Oral BID  . omega-3 acid ethyl esters  2 g Oral Q breakfast  . omega-3 acid ethyl esters  3 g Oral BID AC  . sertraline  100 mg Oral Daily  . sodium chloride flush  3 mL Intravenous Q12H  . tiotropium  18 mcg Inhalation QHS   Continuous Infusions: . sodium chloride    . heparin 2,500 Units/hr (07/17/17 0500)   PRN Meds:.sodium chloride, acetaminophen, morphine injection, nitroGLYCERIN, ondansetron (ZOFRAN) IV, oxyCODONE-acetaminophen, sodium chloride flush  Current Labs: reviewed  11/16 U Na < 10 11/16 UP/C 4.0 11/15 Renal US: R 13.5cm and L 14.3cm, inc echgenicity b/l; no obstruction   Physical Exam:  Blood pressure 139/69, pulse 70, temperature (!) 97.5 F (36.4 C), temperature source Oral, resp. rate 12, height 6\' 1"  (1.854 m), weight (!) 138.3 kg (304 lb 14.3 oz), SpO2 92 %. GEN: obese, in chair, nad ENT: NCAT EYES: EOMI CV: RRR, nl s1s2 no rub PULM: Bibasilar crackles, nl wob, distant  BS ABD: s/nt/nd SKIN: No rashes/lesions EXT:3+ LEE with chronic venous stasis changes  A 1. Oliguric AKI 2/2 contrast nephropathy (11/15 exposure), BL SCr 1-1.2; low UNa might be contrast related; no acute issues on renal US 2. Nephrotic proteinuria, suspect is diabetic related 3. STEMI not able to have successful PCI, prev severe CAD likely med mgmt  4. AFib, on amio now in NSR, on hep systemic 5. HTN 6. DM2 7. COPD 8. OSA 9. Hypervolemic, hyponatremia, mild  Plan 1. Appears to be recovering.  Needs mild fluid restriction around 1.5L for hyponatremia 2. Daily weights, Daily Renal Panel, Strict I/Os, Avoid nephrotoxins (NSAIDs, judicious IV Contrast)  3. Will cont to closely follow.  Hopefully he will stabilize and improve over time.  No dialysis needs at the current time   Pearson Grippe MD 07/17/2017, 8:07 AM  Recent Labs  Lab 07/15/17 1539 07/16/17 0559 07/17/17 0248  NA 130* 129* 126*  K 4.5 4.0 4.0  CL 92* 89* 87*  CO2 26 27 26   GLUCOSE 96 69 115*  BUN 44* 48* 50*  CREATININE 2.90* 2.74* 2.29*  CALCIUM 8.2* 8.0* 7.9*   Recent Labs  Lab 07/15/17 0235 07/16/17 0233 07/17/17 0248  WBC 15.5* 14.6* 10.7*  HGB 11.9* 10.2* 10.5*  HCT 36.6* 32.0* 33.0*  MCV 86.9 86.7  86.2  PLT 187 213 227

## 2017-07-17 NOTE — Progress Notes (Signed)
Admit: 07/14/2017 LOS: 3  40M with AKI (BL SCr 1-1.2) after LHC for STEMI 07/14/17  Subjective:  No overnight evetns, wife in room, updated 2L UOP yesterday! Downtrending SCr SNa down to 126  11/17 0701 - 11/18 0700 In: 566.7 [I.V.:566.7] Out: 2000 [Urine:2000]  Filed Weights   07/14/17 0910 07/16/17 0600  Weight: 133.8 kg (295 lb) (!) 138.3 kg (304 lb 14.3 oz)    Scheduled Meds: . amiodarone  200 mg Oral BID  . aspirin EC  81 mg Oral Daily  . atorvastatin  40 mg Oral q1800  . chlorhexidine  15 mL Mouth Rinse BID  . clopidogrel  75 mg Oral Daily  . ferrous sulfate  325 mg Oral TID WC  . Influenza vac split quadrivalent PF  0.5 mL Intramuscular Tomorrow-1000  . insulin aspart  0-5 Units Subcutaneous QHS  . insulin aspart  0-9 Units Subcutaneous TID WC  . insulin glargine  25 Units Subcutaneous Daily  . insulin glargine  30 Units Subcutaneous QHS  . isosorbide mononitrate  30 mg Oral Daily  . magnesium oxide  400 mg Oral Daily  . mouth rinse  15 mL Mouth Rinse q12n4p  . metoprolol tartrate  25 mg Oral BID  . omega-3 acid ethyl esters  2 g Oral Q breakfast  . omega-3 acid ethyl esters  3 g Oral BID AC  . sertraline  100 mg Oral Daily  . sodium chloride flush  3 mL Intravenous Q12H  . tiotropium  18 mcg Inhalation QHS   Continuous Infusions: . sodium chloride    . heparin 2,500 Units/hr (07/17/17 0500)   PRN Meds:.sodium chloride, acetaminophen, morphine injection, nitroGLYCERIN, ondansetron (ZOFRAN) IV, oxyCODONE-acetaminophen, sodium chloride flush  Current Labs: reviewed  11/16 U Na < 10 11/16 UP/C 4.0 11/15 Renal US: R 13.5cm and L 14.3cm, inc echgenicity b/l; no obstruction   Physical Exam:  Blood pressure 139/69, pulse 70, temperature (!) 97.5 F (36.4 C), temperature source Oral, resp. rate 12, height 6\' 1"  (1.854 m), weight (!) 138.3 kg (304 lb 14.3 oz), SpO2 92 %. GEN: obese, in chair, nad ENT: NCAT EYES: EOMI CV: RRR, nl s1s2 no rub PULM: Bibasilar  crackles, nl wob, distant BS ABD: s/nt/nd SKIN: No rashes/lesions EXT:3+ LEE with chronic venous stasis changes  A 1. Resolving oliguric AKI 2/2 contrast nephropathy (11/15 exposure), BL SCr 1-1.2; low UNa might be contrast related; no acute issues on renal US 2. Nephrotic proteinuria, suspect is diabetic related 3. STEMI not able to have successful PCI, prev severe CAD likely med mgmt  4. AFib, on amio now in NSR, on hep systemic 5. HTN 6. DM2 7. COPD 8. OSA 9. Hypervolemic, hyponatremia, mild  Plan 1. Start lasix 80 IV BID for hypervolemia 2. Daily weights, Daily Renal Panel, Strict I/Os, Avoid nephrotoxins (NSAIDs, judicious IV Contrast)  3. Will follow along   Pearson Grippe MD 07/17/2017, 8:28 AM  Recent Labs  Lab 07/15/17 1539 07/16/17 0559 07/17/17 0248  NA 130* 129* 126*  K 4.5 4.0 4.0  CL 92* 89* 87*  CO2 26 27 26   GLUCOSE 96 69 115*  BUN 44* 48* 50*  CREATININE 2.90* 2.74* 2.29*  CALCIUM 8.2* 8.0* 7.9*   Recent Labs  Lab 07/15/17 0235 07/16/17 0233 07/17/17 0248  WBC 15.5* 14.6* 10.7*  HGB 11.9* 10.2* 10.5*  HCT 36.6* 32.0* 33.0*  MCV 86.9 86.7 86.2  PLT 187 213 227

## 2017-07-17 NOTE — Progress Notes (Signed)
ANTICOAGULATION CONSULT NOTE - Follow Up Consult  Pharmacy Consult for heparin  Indication: afib, high risk for LV thrombus  Allergies  Allergen Reactions  . Simvastatin     Muscle weakness    Patient Measurements: Height: 6\' 1"  (185.4 cm) Weight: (!) 307 lb 1.6 oz (139.3 kg) IBW/kg (Calculated) : 79.9 Heparin Dosing Weight: 110kg  Vital Signs: Temp: 98.1 F (36.7 C) (11/18 0829) Temp Source: Oral (11/18 0829) BP: 171/77 (11/18 0829) Pulse Rate: 75 (11/18 0829)  Labs: Recent Labs    07/14/17 1929  07/15/17 0235  07/15/17 1036 07/15/17 1539 07/15/17 2042 07/16/17 0233 07/16/17 0559 07/17/17 0248  HGB  --   --  11.9*  --   --   --   --  10.2*  --  10.5*  HCT  --   --  36.6*  --   --   --   --  32.0*  --  33.0*  PLT  --   --  187  --   --   --   --  213  --  227  HEPARINUNFRC  --    < >  --    < >  --   --  0.20*  --  0.68 0.47  CREATININE  --   --  2.59*  --   --  2.90*  --   --  2.74* 2.29*  TROPONINI 17.03*  --  22.73*  --  18.41*  --   --   --   --   --    < > = values in this interval not displayed.    Estimated Creatinine Clearance: 46.5 mL/min (A) (by C-G formula based on SCr of 2.29 mg/dL (H)).  Assessment: 66 yo male s/p cath with 3VCAD and afib on heparin.   Heparin continues today as renal function is followed closely, plan to start oral anticoagulation of DOAC vs warfarin depending on renal recovery. Scr down to 2.2 with good uop overnight.  Heparin level at goal this am. CBC stable. No bleeding issues noted.  Goal of Therapy:  Heparin level 0.3-0.7 units/ml Monitor platelets by anticoagulation protocol: Yes   Plan:  Heparin at 2500 units/hr -Heparin level daily wth CBC daily  Erin Hearing PharmD., BCPS Clinical Pharmacist Pager [phone] 07/17/2017 8:34 AM

## 2017-07-17 NOTE — Progress Notes (Signed)
Attempted to weigh pt using standing scale.  Assisted to side of bed with oxygen on at [facility].  Pt extremely SOB and unable to stand.  o2 sat 88%.  Pt sitting up in bed at 90 degrees.  O2 sats up to 96%.  Will continue to monitor closely.

## 2017-07-17 NOTE — Progress Notes (Signed)
Progress Note  Patient Name: Jose [name] [name] Date of Encounter: 07/17/2017  Primary Cardiologist: Harl Bowie  Subjective   Denies angina or dyspnea.  Marked improvement in urine output after placement of catheter.  Now with roughly even intake/output since admission.  Weight has actually gone up but unsure about accuracy (bed scale yesterday, standing today) Creatinine shows substantial improvement today, down to 2.2. In and out w significant residual yesterday, but now urinating easier. Persistent ST elevation. No significant arrhythmia on monitor. No further episodes of hypoglycemia, reasonably well controlled glucose on current regimen.  He is receiving half of his home dose of Lantus, none of the scheduled 30 units of NovoLog with meals that he was getting 3 times a day  Inpatient Medications    Scheduled Meds: . amiodarone  200 mg Oral BID  . aspirin EC  81 mg Oral Daily  . atorvastatin  40 mg Oral q1800  . chlorhexidine  15 mL Mouth Rinse BID  . clopidogrel  75 mg Oral Daily  . ferrous sulfate  325 mg Oral TID WC  . furosemide  80 mg Intravenous BID  . Influenza vac split quadrivalent PF  0.5 mL Intramuscular Tomorrow-1000  . insulin aspart  0-5 Units Subcutaneous QHS  . insulin aspart  0-9 Units Subcutaneous TID WC  . insulin glargine  25 Units Subcutaneous Daily  . insulin glargine  30 Units Subcutaneous QHS  . isosorbide mononitrate  30 mg Oral Daily  . magnesium oxide  400 mg Oral Daily  . mouth rinse  15 mL Mouth Rinse q12n4p  . metoprolol tartrate  25 mg Oral BID  . omega-3 acid ethyl esters  2 g Oral Q breakfast  . omega-3 acid ethyl esters  3 g Oral BID AC  . sertraline  100 mg Oral Daily  . sodium chloride flush  3 mL Intravenous Q12H  . tiotropium  18 mcg Inhalation QHS   Continuous Infusions: . sodium chloride    . heparin 2,500 Units/hr (07/17/17 0500)   PRN Meds: sodium chloride, acetaminophen, morphine injection, nitroGLYCERIN, ondansetron (ZOFRAN) IV,  oxyCODONE-acetaminophen, sodium chloride flush   Vital Signs    Vitals:   07/17/17 0200 07/17/17 0300 07/17/17 0500 07/17/17 0829  BP: 137/71 (!) 133/41 139/69 (!) 171/77  Pulse: 70 69 70 75  Resp: 19 15 12  (!) 21  Temp:  (!) 97.5 F (36.4 C)  98.1 F (36.7 C)  TempSrc:  Oral  Oral  SpO2: 92% (!) 89% 92% 100%  Weight:    (!) 307 lb 1.6 oz (139.3 kg)  Height:        Intake/Output Summary (Last 24 hours) at 07/17/2017 0859 Last data filed at 07/17/2017 0830 Gross per 24 hour  Intake 525 ml  Output 2350 ml  Net -1825 ml   Filed Weights   07/14/17 0910 07/16/17 0600 07/17/17 0829  Weight: 295 lb (133.8 kg) (!) 304 lb 14.3 oz (138.3 kg) (!) 307 lb 1.6 oz (139.3 kg)    Telemetry    Sinus rhythm with occasional PACs and occasional PVCs- Personally Reviewed  ECG    No new tracing- Personally Reviewed  Physical Exam  Looks more tired than yesterday GEN: No acute distress.   Neck: hard to evaluate JVD due to obesity Cardiac: RRR, no murmurs, rubs, or gallops.  Respiratory: Clear to auscultation bilaterally, but generally diminished breath sounds. GI: Soft, nontender, non-distended  MS: No edema; No deformity. Neuro:  Nonfocal  Psych: Normal affect   Labs  Chemistry Recent Labs  Lab 07/15/17 1539 07/16/17 0559 07/17/17 0248  NA 130* 129* 126*  K 4.5 4.0 4.0  CL 92* 89* 87*  CO2 26 27 26   GLUCOSE 96 69 115*  BUN 44* 48* 50*  CREATININE 2.90* 2.74* 2.29*  CALCIUM 8.2* 8.0* 7.9*  GFRNONAA 21* 23* 28*  GFRAA 24* 26* 33*  ANIONGAP 12 13 13      Hematology Recent Labs  Lab 07/15/17 0235 07/16/17 0233 07/17/17 0248  WBC 15.5* 14.6* 10.7*  RBC 4.21* 3.69* 3.83*  HGB 11.9* 10.2* 10.5*  HCT 36.6* 32.0* 33.0*  MCV 86.9 86.7 86.2  MCH 28.3 27.6 27.4  MCHC 32.5 31.9 31.8  RDW 15.2 15.2 15.1  PLT 187 213 227    Cardiac Enzymes Recent Labs  Lab 07/14/17 1559 07/14/17 1929 07/15/17 0235 07/15/17 1036  TROPONINI 16.59* 17.03* 22.73* 18.41*      Recent Labs  Lab 07/14/17 0931  TROPIPOC 18.10*     BNP Recent Labs  Lab 07/15/17 0235  BNP 258.5*     DDimer No results for input(s): DDIMER in the last 168 hours.   Radiology    US Renal  Result Date: 07/15/2017 CLINICAL DATA:  Initial evaluation for acute kidney injury. History of diabetes, hypertension. Obesity. EXAM: RENAL / URINARY TRACT ULTRASOUND COMPLETE COMPARISON:  None. FINDINGS: Right Kidney: Length: 13.5 cm. Right kidney demonstrates a somewhat lobulated contour. Diffusely increased echogenicity, suggesting medical renal disease. No mass or hydronephrosis visualized. Trace free perinephric fluid noted. Left Kidney: Length: 14.3 cm. Left kidney demonstrates a lobulated contour. Diffusely increased echogenicity, suggesting medical renal disease. No mass or hydronephrosis visualized. Bladder: Appears normal for degree of bladder distention. IMPRESSION: Diffusely increased echogenicity, suggesting medical renal disease. No hydronephrosis. Electronically Signed   By: Jeannine Boga M.D.   On: 07/15/2017 21:23    Cardiac Studies    Cardiac cath July 14, 2017  -unsuccessful attempted PTCA of occluded distal LAD, chronic occlusion of RCA Diagnostic Diagram       Post-Intervention Diagram        Echo July 14, 2017 - Left ventricle: LVEF is approximately 40% with akinesis of the distal inferoseptal wall and apex. Aneurysmal dilatation of apex. Consider limited echo with contrast to evaluate for thrombus. The cavity size was normal. Wall thickness was increased in a pattern of mild LVH. - Mitral valve: Calcified annulus. Mildly thickened leaflets .     Patient Profile     66 y.o. male presenting with extensive anteroapical and inferior infarction, unsuccessful attempt at revascularization of occluded distal LAD, chronic occlusion of RCA.   Moderate reduction in ventricular systolic function.   Atrial fibrillation with rapid ventricular  response present on arrival, resolved with amiodarone.   Moderate acute renal failure related to contrast nephrotoxicity, improving.   Long-standing insulin requiring diabetes mellitus, insulin dose decreased due to occasional hypoglycemia    Assessment & Plan    1. CAD s/p STEMI: Has frozen infarct pattern for the development of aneurysm.  Unsuccessful percutaneous revascularization and marginal candidate for surgery.  Continue intravenous heparin for multiple indications (risk of LV apical thrombus, atrial fibrillation, recent STEMI).    It appears that his renal function will improve sufficiently to allow treatment with direct oral anticoagulant rather than warfarin.  Long-term would plan clopidogrel and anticoagulant, stop aspirin after 30 days.    As renal function improves, next week will ask for a surgical opinion regarding CABG, but I suspect they will recommend against high risk surgery.  2. CHF:  1.5 L net diuresis yesterday, still appears markedly hypervolemic. More dyspneic than yesterday.  Will defer to Dr. Joelyn Oms when it might be safe to start him on RAAS inhibitors. Plan to start diuretics today. 3. AFib:  Plan current dose of amiodarone 4 weeks, then decrease to maintenance dose of 200 mg daily CHADSVasc 5 (age, hypertension, diabetes, heart failure, CAD). 4. Acute renal failure on CKD stage III:  Excellent improvement in urine output and creatinine starting to trend downwards.  Mostly contrast nephrotoxicity, possible contribution of bladder urinary retention 5. DM: No further hypoglycemia. Appreciate IM help with management.. 6.  Urinary retention: Currently without indwelling catheter, monitor for retention 7.  COPD/Smoking: Strongly recommend permanent smoking cessation. He may benefit from bronchodilators. 8.  Hyponatremia:  Has worsened today, will institute fluid restriction   For questions or updates, please contact [place] Please consult [email or web address] for contact  info under Cardiology/STEMI.      Signed, Sanda Klein, MD  07/17/2017, 8:59 AM

## 2017-07-18 ENCOUNTER — Other Ambulatory Visit: Payer: Self-pay

## 2017-07-18 DIAGNOSIS — I5033 Acute on chronic diastolic (congestive) heart failure: Secondary | ICD-10-CM

## 2017-07-18 LAB — CBC
HEMATOCRIT: 29.7 % — AB (ref 39.0–52.0)
HEMOGLOBIN: 9.7 g/dL — AB (ref 13.0–17.0)
MCH: 27.9 pg (ref 26.0–34.0)
MCHC: 32.7 g/dL (ref 30.0–36.0)
MCV: 85.3 fL (ref 78.0–100.0)
Platelets: 236 10*3/uL (ref 150–400)
RBC: 3.48 MIL/uL — AB (ref 4.22–5.81)
RDW: 14.7 % (ref 11.5–15.5)
WBC: 9 10*3/uL (ref 4.0–10.5)

## 2017-07-18 LAB — BASIC METABOLIC PANEL
Anion gap: 11 (ref 5–15)
BUN: 46 mg/dL — AB (ref 6–20)
CALCIUM: 7.9 mg/dL — AB (ref 8.9–10.3)
CHLORIDE: 89 mmol/L — AB (ref 101–111)
CO2: 26 mmol/L (ref 22–32)
CREATININE: 1.94 mg/dL — AB (ref 0.61–1.24)
GFR calc Af Amer: 40 mL/min — ABNORMAL LOW (ref >60)
GFR, EST NON AFRICAN AMERICAN: 34 mL/min — AB (ref >60)
Glucose, Bld: 117 mg/dL — ABNORMAL HIGH (ref 65–99)
Potassium: 4 mmol/L (ref 3.5–5.1)
Sodium: 126 mmol/L — ABNORMAL LOW (ref 135–145)

## 2017-07-18 LAB — GLUCOSE, CAPILLARY
GLUCOSE-CAPILLARY: 129 mg/dL — AB (ref 65–99)
Glucose-Capillary: 88 mg/dL (ref 65–99)
Glucose-Capillary: 172 mg/dL — ABNORMAL HIGH (ref 65–99)

## 2017-07-18 LAB — HEPARIN LEVEL (UNFRACTIONATED): Heparin Unfractionated: 0.43 IU/mL (ref 0.30–0.70)

## 2017-07-18 MED ORDER — SENNOSIDES-DOCUSATE SODIUM 8.6-50 MG PO TABS
1.0000 | ORAL_TABLET | Freq: Two times a day (BID) | ORAL | Status: AC
  Administered 2017-07-18 – 2017-07-19 (×4): 1 via ORAL
  Filled 2017-07-18 (×4): qty 1

## 2017-07-18 NOTE — Progress Notes (Signed)
Progress Note  Patient Name: Jose [name] Date of Encounter: 07/18/2017  Primary Cardiologist: Harl Bowie  Subjective   Patient states he feels significantly better today. He denies further chest pain, and shortness of breath has improved. He denies palpitations. His main complaint is constipation.  Inpatient Medications    Scheduled Meds: . amiodarone  200 mg Oral BID  . aspirin EC  81 mg Oral Daily  . atorvastatin  40 mg Oral q1800  . chlorhexidine  15 mL Mouth Rinse BID  . clopidogrel  75 mg Oral Daily  . ferrous sulfate  325 mg Oral TID WC  . furosemide  80 mg Intravenous BID  . Influenza vac split quadrivalent PF  0.5 mL Intramuscular Tomorrow-1000  . insulin aspart  0-5 Units Subcutaneous QHS  . insulin aspart  0-9 Units Subcutaneous TID WC  . insulin glargine  25 Units Subcutaneous Daily  . insulin glargine  30 Units Subcutaneous QHS  . isosorbide mononitrate  30 mg Oral Daily  . magnesium oxide  400 mg Oral Daily  . mouth rinse  15 mL Mouth Rinse q12n4p  . metoprolol tartrate  25 mg Oral BID  . omega-3 acid ethyl esters  2 g Oral Q breakfast  . omega-3 acid ethyl esters  3 g Oral BID AC  . sertraline  100 mg Oral Daily  . sodium chloride flush  3 mL Intravenous Q12H  . tiotropium  18 mcg Inhalation QHS   Continuous Infusions: . sodium chloride    . heparin 2,500 Units/hr (07/17/17 2337)   PRN Meds: sodium chloride, acetaminophen, albuterol, morphine injection, nitroGLYCERIN, ondansetron (ZOFRAN) IV, oxyCODONE-acetaminophen, sodium chloride flush   Vital Signs    Vitals:   07/18/17 0340 07/18/17 0400 07/18/17 0500 07/18/17 0600  BP:  135/72 137/74 (!) 102/91  Pulse:  64 65 (!) 59  Resp:  15 13 14   Temp:      TempSrc:      SpO2:  98% 99% 96%  Weight: (!) 302 lb (137 kg)     Height:        Intake/Output Summary (Last 24 hours) at 07/18/2017 0748 Last data filed at 07/18/2017 0600 Gross per 24 hour  Intake 1355 ml  Output 4275 ml  Net -2920 ml    Filed Weights   07/16/17 0600 07/17/17 0829 07/18/17 0340  Weight: (!) 304 lb 14.3 oz (138.3 kg) (!) 307 lb 1.6 oz (139.3 kg) (!) 302 lb (137 kg)    Telemetry    Sinus rhythm with occasional PVCs, and 4 beat run ov NSVT- Personally Reviewed 07/18/17  ECG    N/a - Personally Reviewed  Physical Exam   GEN: No acute distress, sitting to eat breakfast.   Neck: hard to evaluate JVD due to obesity Cardiac: RRR, no murmurs, rubs, or gallops.  Respiratory: Clear to auscultation bilaterally, but very diminished breath sounds bilaterally GI: Soft, nontender; distended and tight  MS: 2+ pitting edema to thighs Neuro:  Nonfocal  Psych: Normal affect   Labs    Chemistry Recent Labs  Lab 07/16/17 0559 07/17/17 0248 07/18/17 0225  NA 129* 126* 126*  K 4.0 4.0 4.0  CL 89* 87* 89*  CO2 27 26 26   GLUCOSE 69 115* 117*  BUN 48* 50* 46*  CREATININE 2.74* 2.29* 1.94*  CALCIUM 8.0* 7.9* 7.9*  GFRNONAA 23* 28* 34*  GFRAA 26* 33* 40*  ANIONGAP 13 13 11      Hematology Recent Labs  Lab 07/16/17 0233 07/17/17 0248  07/18/17 0225  WBC 14.6* 10.7* 9.0  RBC 3.69* 3.83* 3.48*  HGB 10.2* 10.5* 9.7*  HCT 32.0* 33.0* 29.7*  MCV 86.7 86.2 85.3  MCH 27.6 27.4 27.9  MCHC 31.9 31.8 32.7  RDW 15.2 15.1 14.7  PLT 213 227 236    Cardiac Enzymes Recent Labs  Lab 07/14/17 1559 07/14/17 1929 07/15/17 0235 07/15/17 1036  TROPONINI 16.59* 17.03* 22.73* 18.41*    Recent Labs  Lab 07/14/17 0931  TROPIPOC 18.10*     BNP Recent Labs  Lab 07/15/17 0235  BNP 258.5*     DDimer No results for input(s): DDIMER in the last 168 hours.   Radiology    Dg Chest Port 1 View  Result Date: 07/17/2017 CLINICAL DATA:  Dyspnea EXAM: PORTABLE CHEST 1 VIEW COMPARISON:  Chest x-ray dated 03/17/2016. FINDINGS: Ill-defined opacity at the right lung base. Stable cardiomegaly. Mild bilateral interstitial prominence is stable, presumably chronic. IMPRESSION: 1. Ill-defined opacity at the right  lung base, pneumonia versus asymmetric edema. If afebrile, favor asymmetric pulmonary edema. 2. Cardiomegaly with bilateral interstitial edema suggesting mild CHF. Appearance is similar to the previous study of 03/17/2016, compatible with a mild chronic CHF. Electronically Signed   By: Franki Cabot M.D.   On: 07/17/2017 10:44    Cardiac Studies    Cardiac cath July 14, 2017  -unsuccessful attempted PTCA of occluded distal LAD, chronic occlusion of RCA Diagnostic Diagram       Post-Intervention Diagram        Echo July 14, 2017 - Left ventricle: LVEF is approximately 40% with akinesis of the distal inferoseptal wall and apex. Aneurysmal dilatation of apex. Consider limited echo with contrast to evaluate for thrombus. The cavity size was normal. Wall thickness was increased in a pattern of mild LVH. - Mitral valve: Calcified annulus. Mildly thickened leaflets .  Patient Profile     66 y.o. male presenting with extensive anteroapical and inferior infarction, unsuccessful attempt at revascularization of occluded distal LAD, chronic occlusion of RCA.   Moderate reduction in ventricular systolic function.   Atrial fibrillation with rapid ventricular response present on arrival, resolved with amiodarone.   Moderate acute renal failure related to contrast nephrotoxicity, improving.    Assessment & Plan    CAD s/p STEMI:  Unsuccessful PCI to distal LAD occlusion; echo consistent with aneurysmal dilation of apex. On heparin (for STEMI, a fib, and risk of LV thrombus in setting of aneurysmal dilation).  Long-term would plan clopidogrel and anticoagulant, stop aspirin after 30 days. Will consult CVTS to evaluate for surgery as renal function improves. --asa 81mg  daily, plavix 75mg  daily, atorvastatin 40mg  daily, lopresor 25mg  BID --heparin gtt  CHF:  Net neg ~3L yesterday, down 2lbs in weight with improvement of renal function. --continue IV lasix 80mg  BID --holding  on starting ACE-I until renal function stabilizes  AFib:  Maintaining sinus rhythm. Plan current dose of amiodarone 4 weeks (200mg  BID), then decrease to maintenance dose of 200 mg daily CHADSVasc 5 (age, hypertension, diabetes, heart failure, CAD). --amiodarone 200mg  BID  Acute renal failure on CKD stage III:   Continues to improve with Cr 1.9 today. Etiology likely combo of contrast nephrotoxicity and urinary retention.   DM:  No further hypoglycemia.  Urinary retention:  Currently without indwelling catheter, monitor for retention  COPD/Smoking:  Strongly recommend permanent smoking cessation. He may benefit from bronchodilators.  Hyponatremia: Stable today at 126. --fluid restriction, diuresis  For questions or updates, please contact [place] Please consult [email or web address]  for contact info under Cardiology/STEMI.   Signed, Alphonzo Grieve, MD  07/18/2017, 7:48 AM    I have personally seen and examined this patient with Dr. Jari Favre. I agree with the assessment and plan as outlined above.  Mr. Delucia is doing well today. He is still volume overloaded. We will continue diuresis with IV Lasix today. He is having no chest pain. Will continue medical management of CAD for now. If recurrent angina, would obtain CT surgery consult for CABG but he seems to be a poor candidate for CABG. He has apical hypokinesis on echo so higher risk for LV apical thrombus. He is on heparin drip now for PAF (occurred in setting of MI-now in sinus) and with risk of LV apical thrombus. Will need to be transitioned to coumadin or NOAC. May need to use coumadin given his CKD. Will discuss further tomorrow.  Will leave in ICU today.   Lauree Chandler 07/18/2017 10:05 AM

## 2017-07-18 NOTE — Progress Notes (Signed)
ANTICOAGULATION CONSULT NOTE - Follow Up Consult  Pharmacy Consult for heparin  Indication: afib, high risk for LV thrombus  Allergies  Allergen Reactions  . Simvastatin     Muscle weakness    Patient Measurements: Height: 6\' 1"  (185.4 cm) Weight: (!) 302 lb (137 kg) IBW/kg (Calculated) : 79.9 Heparin Dosing Weight: 110kg  Vital Signs: Temp: 98.2 F (36.8 C) (11/19 0700) Temp Source: Oral (11/19 0700) BP: 164/70 (11/19 0800) Pulse Rate: 73 (11/19 0800)  Labs: Recent Labs    07/15/17 1036  07/16/17 0233 07/16/17 0559 07/17/17 0248 07/18/17 0225  HGB  --    < > 10.2*  --  10.5* 9.7*  HCT  --   --  32.0*  --  33.0* 29.7*  PLT  --   --  213  --  227 236  HEPARINUNFRC  --    < >  --  0.68 0.47 0.43  CREATININE  --    < >  --  2.74* 2.29* 1.94*  TROPONINI 18.41*  --   --   --   --   --    < > = values in this interval not displayed.    Estimated Creatinine Clearance: 54.4 mL/min (A) (by C-G formula based on SCr of 1.94 mg/dL (H)).  Assessment: 66 yo male s/p cath with 3VCAD and afib on heparin.   Heparin continues today as renal function is followed closely, plan to start oral anticoagulation of DOAC vs warfarin depending on renal recovery. Scr down to 1.9 with good uop overnight.  Heparin level continues to be at goal this am. CBC stable. No bleeding issues noted.  Goal of Therapy:  Heparin level 0.3-0.7 units/ml Monitor platelets by anticoagulation protocol: Yes   Plan:  Heparin at 2500 units/hr Heparin level daily wth CBC daily  Erin Hearing PharmD., BCPS Clinical Pharmacist Pager [phone] 07/18/2017 9:38 AM

## 2017-07-18 NOTE — Progress Notes (Signed)
[facility] Rounding Note  Subjective:  Robust response to IV lasix >4 liter UOP so far past 24 hours! Creatinine continues to fall and weight down 3.3 kg (still 4 kg over admit weight (and "dry" weight likely much lower than admit weight)  11/18 0701 - 11/19 0700 In: 1355 [P.O.:780; I.V.:575] Out: 4275 [Urine:4275]  Filed Weights   07/16/17 0600 07/17/17 0829 07/18/17 0340  Weight: (!) 138.3 kg (304 lb 14.3 oz) (!) 139.3 kg (307 lb 1.6 oz) (!) 137 kg (302 lb)    Physical Exam:  Blood pressure (!) 102/91, pulse (!) 59, temperature 98.2 F (36.8 C), temperature source Oral, resp. rate 14, height 6\' 1"  (1.854 m), weight (!) 137 kg (302 lb), SpO2 99 %. GEN: obese, in chair, nad ENT: NCAT EYES: EOMI CV: RRR, nl s1s2 no rub PULM: Bibasilar crackles, some ^ wob ABD: s/nt/nd. Very large pannus.  SKIN: No rashes/lesions JOI:NOMVEHM edema to the hips bilaterally 2-3+  Scheduled Meds: . amiodarone  200 mg Oral BID  . aspirin EC  81 mg Oral Daily  . atorvastatin  40 mg Oral q1800  . chlorhexidine  15 mL Mouth Rinse BID  . clopidogrel  75 mg Oral Daily  . ferrous sulfate  325 mg Oral TID WC  . furosemide  80 mg Intravenous BID  . Influenza vac split quadrivalent PF  0.5 mL Intramuscular Tomorrow-1000  . insulin aspart  0-5 Units Subcutaneous QHS  . insulin aspart  0-9 Units Subcutaneous TID WC  . insulin glargine  25 Units Subcutaneous Daily  . insulin glargine  30 Units Subcutaneous QHS  . isosorbide mononitrate  30 mg Oral Daily  . magnesium oxide  400 mg Oral Daily  . mouth rinse  15 mL Mouth Rinse q12n4p  . metoprolol tartrate  25 mg Oral BID  . omega-3 acid ethyl esters  2 g Oral Q breakfast  . omega-3 acid ethyl esters  3 g Oral BID AC  . sertraline  100 mg Oral Daily  . sodium chloride flush  3 mL Intravenous Q12H  . tiotropium  18 mcg Inhalation QHS   Continuous Infusions: . sodium chloride    . heparin 2,500 Units/hr (07/17/17 2337)   PRN Meds:.sodium chloride,  acetaminophen, albuterol, morphine injection, nitroGLYCERIN, ondansetron (ZOFRAN) IV, oxyCODONE-acetaminophen, sodium chloride flush  Recent Labs  Lab 07/16/17 0559 07/17/17 0248 07/18/17 0225  NA 129* 126* 126*  K 4.0 4.0 4.0  CL 89* 87* 89*  CO2 27 26 26   GLUCOSE 69 115* 117*  BUN 48* 50* 46*  CREATININE 2.74* 2.29* 1.94*  CALCIUM 8.0* 7.9* 7.9*   Recent Labs  Lab 07/16/17 0233 07/17/17 0248 07/18/17 0225  WBC 14.6* 10.7* 9.0  HGB 10.2* 10.5* 9.7*  HCT 32.0* 33.0* 29.7*  MCV 86.7 86.2 85.3  PLT 213 227 236   11/16 U Na < 10 11/16 UP/C 4.0 11/15 Renal US: R 13.5cm and L 14.3cm, inc echgenicity b/l; no obstruction   07/15/2017 16:51  Protein Creatinine Ratio 4.02 (H)    Background 7M with AKI (BL SCr 1-1.2) after LHC for STEMI 07/14/17. Baseline creatinine 1-1.2  Assessment 1. Resolving oliguric AKI 2/2 contrast nephropathy (11/15 exposure), low UNa was likely contrast related; no acute issues on renal US 2. Nephrotic proteinuria (4 gm), suspect is diabetic related (will complicate fluid management in the future) 3. Anasarca 4. STEMI not able to have successful PCI, prev severe CAD med mgmt  5. AFib, on amio now in NSR, on hep systemic 6.  HTN 7. DM2 8. COPD 9. OSA 10. Hypervolemic, hyponatremia, mild  Plan 1. On lasix 80 IV BID for hypervolemia and is diuresing beautifully with a continued fall in creatinine 2. Still has VERY substantial amount of volume on board so further aggressive IV diuresis will be needed 3. Tried to reinforce 32 oz fluid restriction with him (drinking lots of free water that is contributing to his hyponatremia 4. Daily weights, Daily Renal Panel, Strict I/Os, Avoid nephrotoxins (NSAIDs, judicious IV Contrast)   Anticipate continued recovery. Renal will sign off at this time, further diuretic management per cardiology  Jamal Maes, MD [place] Pager 07/18/2017, 9:10 AM

## 2017-07-18 NOTE — Progress Notes (Signed)
PROGRESS NOTE  Jose [name] HER:[id number] DOB: 25-Dec-1950 DOA: 07/14/2017 PCP: Cleophas Dunker, MD  HPI/Recap of past 24 hours: Mr Jose [name] is a 66 yo male with past medical hx significant for CAD with 3 vessels disease non amenable to PCI who presented on 07/14/17 at [facility] ED with complaints of chest pain. Found to have acute STEMI affecting the infra lateral leads transferred emergently to [facility]. Taken for emergent cath but no return to LAD and chronically occluded RCA. Unclear whether a candidate for CTS. Cardiology following.  Hospital course complicated by new onset A-fib, AKI, dyselectrolytemia, urinary retention. Other medical hx include obesity, type 2 diabetes, remote tobacco use (quit 3 years ago per his wife), COPD, HTN, HLD.   2D echo 07/14/17: Left ventricle: LVEF is approximately 40% with akinesis of the   distal inferoseptal wall and apex. Aneurysmal dilatation of apex.   Consider limited echo with contrast to evaluate for thrombus.   Limited echo with contrast has not been done due to AKI. Patient on heparin drip. AKI is improving. Defer limited echo to cardiology.  No acute events reported overnight. Awaiting CTS decision regarding CABG.  This morning, the patient reports he feels better. Breathing is improved and he is chest pain free.    Assessment/Plan: Principal Problem:   STEMI (ST elevation myocardial infarction) ([place]) Active Problems:   COPD (chronic obstructive pulmonary disease) (HCC)   HTN (hypertension)   Diabetes ([place])   Morbid obesity (HCC)   OSA (obstructive sleep apnea)   Chronic diastolic CHF (congestive heart failure) (HCC)   Atrial fibrillation with rapid ventricular response ([place])   STEMI involving left anterior descending coronary artery ([place])  CAD s/p STEMI -  -emergent cath 07/14/17 -chest pain free off of the nitroglycerine drip -medical management for this coronary artery disease -aspirin, Plavix, atorvastatin 40, metoprolol  tartrate 12.5 mg BID, isosorbide mononitrate, amiodarone -no ACE/ARB at this time given AKI -As renal function improves, surgical opinion regarding CABG, but cardio suspects they will recommend against, high risk surgery. -Awaiting CTS recommendations   Acute on chronic combined CHF -volume overload on exam with acute STEMI -continue diuresis 80 mg IV BID -negative 300 daily total and - 2.8K since admission -continue to monitor I&Os, daily weight, fluid restriction  HFREF 40% -echo 07/14/17 revealed LVEF is approximately 40% with akinesis of the   distal inferoseptal wall and apex. Aneurysmal dilatation of apex.   Consider limited echo with contrast to evaluate for thrombus -Defer to cardiology for limited echo  AKI on CKD stage III  -improving -cr  1.94 from 2,29 from 2.74, baseline 1.50 -monitor urine output -avoid nephrotoxic agents -BMP am  Hypervolemic hyponatremia -most likely dilutional  -na+ 126 from 129 -Fluid restriction -monitor urine intake and output - on lasix IV 80 mg BID - nephrology following -bmp am  Urinary retention -Improving, not on indwelling foley cath -continue to monitor urinary output  Normocytic anemia -stable; hg 9 -no sign of obvious bleeding -po iron supplement -CBC in am  New onset Afib with rvr -Now in sinus rhythm with rate controlled in the 80s -cardiology following. We appreciate recommendations -Long-term would plan clopidogrel and anticoagulant, stop aspirin after 30 days.   -continue amiodarone 360 mg x 4 weeks then decrease to maintenance dose to 200 mg daily. -CHADSVASC score 5  Diabetes  - A1c 7.9 - lantus and novolog  - ISS with hypoglycemic protocol  Hyperkalemia  -resolved - kayexalate(K 5.3)  Obesity  - Need weight  loss. - regular physical activity when stable and a healthy diet - cardiac rehab  Depression/anxiety - stale - zoloft  Former Tobacco use  - per his wife he quit 3 years ago -  patient states he quit smoking 9 years ago - Avoid tobacco   Code Status: Full  Family Communication: No family present at bedside  Disposition Plan: Will stay another midnight to continue heparin drip, close monitoring, improvement of renal function/AKI.    Consultants:  Cardiology  nephrology  Procedures:  Left cardiac cath 07/16/17  Antimicrobials:  None indicated   DVT prophylaxis:  Heparin drip   Objective: Vitals:   07/18/17 0340 07/18/17 0400 07/18/17 0500 07/18/17 0600  BP:  135/72 137/74 (!) 102/91  Pulse:  64 65 (!) 59  Resp:  15 13 14   Temp:      TempSrc:      SpO2:  98% 99% 96%  Weight: (!) 137 kg (302 lb)     Height:        Intake/Output Summary (Last 24 hours) at 07/18/2017 0738 Last data filed at 07/18/2017 0600 Gross per 24 hour  Intake 1355 ml  Output 4275 ml  Net -2920 ml   Filed Weights   07/16/17 0600 07/17/17 0829 07/18/17 0340  Weight: (!) 138.3 kg (304 lb 14.3 oz) (!) 139.3 kg (307 lb 1.6 oz) (!) 137 kg (302 lb)    Exam:   General:  66 yo morbidly obese male sitting up in a chair NAD  Cardiovascular: RRR with no rubs or gallops  Respiratory: Mild rales at bases bilaterally. No wheezes noted  Abdomen: morbidly obese NBS x 4 quadrants, NT, ND  Musculoskeletal: 3+ pitting edema, could not palpate pulse due to severe edema  Skin: echymosis on forearm at site of venous punctures  Psychiatry: Mood is appropriate for condition and setting   Data Reviewed: CBC: Recent Labs  Lab 07/14/17 0922 07/14/17 0937 07/15/17 0235 07/16/17 0233 07/17/17 0248 07/18/17 0225  WBC 13.1*  --  15.5* 14.6* 10.7* 9.0  HGB 12.3* 12.6* 11.9* 10.2* 10.5* 9.7*  HCT 37.3* 37.0* 36.6* 32.0* 33.0* 29.7*  MCV 88.4  --  86.9 86.7 86.2 85.3  PLT 168  --  187 213 227 854   Basic Metabolic Panel: Recent Labs  Lab 07/15/17 0235 07/15/17 1539 07/16/17 0559 07/17/17 0248 07/18/17 0225  NA 130* 130* 129* 126* 126*  K 5.3* 4.5 4.0 4.0 4.0    CL 93* 92* 89* 87* 89*  CO2 23 26 27 26 26   GLUCOSE 229* 96 69 115* 117*  BUN 36* 44* 48* 50* 46*  CREATININE 2.59* 2.90* 2.74* 2.29* 1.94*  CALCIUM 8.2* 8.2* 8.0* 7.9* 7.9*   GFR: Estimated Creatinine Clearance: 54.4 mL/min (A) (by C-G formula based on SCr of 1.94 mg/dL (H)). Liver Function Tests: No results for input(s): AST, ALT, ALKPHOS, BILITOT, PROT, ALBUMIN in the last 168 hours. No results for input(s): LIPASE, AMYLASE in the last 168 hours. No results for input(s): AMMONIA in the last 168 hours. Coagulation Profile: No results for input(s): INR, PROTIME in the last 168 hours. Cardiac Enzymes: Recent Labs  Lab 07/14/17 0922 07/14/17 1559 07/14/17 1929 07/15/17 0235 07/15/17 1036  TROPONINI 20.79* 16.59* 17.03* 22.73* 18.41*   BNP (last 3 results) No results for input(s): PROBNP in the last 8760 hours. HbA1C: No results for input(s): HGBA1C in the last 72 hours. CBG: Recent Labs  Lab 07/16/17 1654 07/16/17 2109 07/17/17 0826 07/17/17 1241 07/17/17 1642  GLUCAP 152*  188* 124* 158* 137*   Lipid Profile: No results for input(s): CHOL, HDL, LDLCALC, TRIG, CHOLHDL, LDLDIRECT in the last 72 hours. Thyroid Function Tests: No results for input(s): TSH, T4TOTAL, FREET4, T3FREE, THYROIDAB in the last 72 hours. Anemia Panel: Recent Labs    07/15/17 1036  TIBC 231*  IRON 17*   Urine analysis:    Component Value Date/Time   COLORURINE AMBER (A) 07/15/2017 1651   APPEARANCEUR CLOUDY (A) 07/15/2017 1651   LABSPEC 1.038 (H) 07/15/2017 1651   PHURINE 5.0 07/15/2017 1651   GLUCOSEU 50 (A) 07/15/2017 1651   HGBUR NEGATIVE 07/15/2017 1651   BILIRUBINUR NEGATIVE 07/15/2017 1651   KETONESUR NEGATIVE 07/15/2017 1651   PROTEINUR >=300 (A) 07/15/2017 1651   NITRITE NEGATIVE 07/15/2017 1651   LEUKOCYTESUR NEGATIVE 07/15/2017 1651   Sepsis Labs: @LABRCNTIP (procalcitonin:4,lacticidven:4)  ) Recent Results (from the past 240 hour(s))  MRSA PCR Screening     Status:  None   Collection Time: 07/14/17 12:13 PM  Result Value Ref Range Status   MRSA by PCR NEGATIVE NEGATIVE Final    Comment:        The GeneXpert MRSA Assay (FDA approved for NASAL specimens only), is one component of a comprehensive MRSA colonization surveillance program. It is not intended to diagnose MRSA infection nor to guide or monitor treatment for MRSA infections.       Studies: Dg Chest Port 1 View  Result Date: 07/17/2017 CLINICAL DATA:  Dyspnea EXAM: PORTABLE CHEST 1 VIEW COMPARISON:  Chest x-ray dated 03/17/2016. FINDINGS: Ill-defined opacity at the right lung base. Stable cardiomegaly. Mild bilateral interstitial prominence is stable, presumably chronic. IMPRESSION: 1. Ill-defined opacity at the right lung base, pneumonia versus asymmetric edema. If afebrile, favor asymmetric pulmonary edema. 2. Cardiomegaly with bilateral interstitial edema suggesting mild CHF. Appearance is similar to the previous study of 03/17/2016, compatible with a mild chronic CHF. Electronically Signed   By: Franki Cabot M.D.   On: 07/17/2017 10:44    Scheduled Meds: . amiodarone  200 mg Oral BID  . aspirin EC  81 mg Oral Daily  . atorvastatin  40 mg Oral q1800  . chlorhexidine  15 mL Mouth Rinse BID  . clopidogrel  75 mg Oral Daily  . ferrous sulfate  325 mg Oral TID WC  . furosemide  80 mg Intravenous BID  . Influenza vac split quadrivalent PF  0.5 mL Intramuscular Tomorrow-1000  . insulin aspart  0-5 Units Subcutaneous QHS  . insulin aspart  0-9 Units Subcutaneous TID WC  . insulin glargine  25 Units Subcutaneous Daily  . insulin glargine  30 Units Subcutaneous QHS  . isosorbide mononitrate  30 mg Oral Daily  . magnesium oxide  400 mg Oral Daily  . mouth rinse  15 mL Mouth Rinse q12n4p  . metoprolol tartrate  25 mg Oral BID  . omega-3 acid ethyl esters  2 g Oral Q breakfast  . omega-3 acid ethyl esters  3 g Oral BID AC  . sertraline  100 mg Oral Daily  . sodium chloride flush  3 mL  Intravenous Q12H  . tiotropium  18 mcg Inhalation QHS    Continuous Infusions: . sodium chloride    . heparin 2,500 Units/hr (07/17/17 2337)     LOS: 4 days     Kayleen Memos, MD [facility] Pager [phone]  If 7PM-7AM, please contact night-coverage [email or web address] Password [facility] 07/18/2017, 7:37 AM

## 2017-07-18 NOTE — Evaluation (Signed)
Physical Therapy Evaluation Patient Details Name: Jose [name] MRN: [id number] DOB: 12/03/1950 Today's Date: 07/18/2017   History of Present Illness  Pt is a 66 y.o. male who presented to [facility] ED on 07/14/17 with chest pain and dyspnea. On arrival to [facility] ED, his EKG showed new onset A-fib with ST elevation in the inferolateral leads; CODE STEMI was called. Cardiac cath 11/15 with unsuccessful attempt at revascularization of occluded distal LAD, chronic occlusion of RCA. Pertinent PMH includes CAD (CTO of RCA, with residual disease in LAD/Lcx), COPD, HL, HTN, obesity,DMand tobacco use.     Clinical Impression  Pt presents with decreased activity tolerance and an overall decrease in functional mobility secondary to above. PTA, pt indep with intermittent use of rollator; lives with wife and son. Pt required max encouragement for participation with PT, only walking to door and back with RW, requiring 1x seated rest break secondary to dyspnea 3/4. Educ on deep breathing technique and importance of increased mobility. Pt would benefit from continued acute PT services to maximize functional mobility and independence prior to d/c with HHPT.     Follow Up Recommendations Home health PT;Supervision for mobility/OOB    Equipment Recommendations       Recommendations for Other Services OT consult     Precautions / Restrictions Precautions Precautions: Fall Restrictions Weight Bearing Restrictions: No      Mobility  Bed Mobility               General bed mobility comments: Received sitting in chair  Transfers Overall transfer level: Needs assistance Equipment used: Rolling walker (2 wheeled) Transfers: Sit to/from Stand Sit to Stand: Supervision         General transfer comment: Stood x2 with RW and supervision for safety; reliant on momentum to power up into standing  Ambulation/Gait Ambulation/Gait assistance: Min guard Ambulation Distance (Feet): 20 Feet Assistive device:  Rolling walker (2 wheeled) Gait Pattern/deviations: Step-to pattern;Wide base of support Gait velocity: Decreased Gait velocity interpretation: <1.8 ft/sec, indicative of risk for recurrent falls General Gait Details: Amb in room with RW and supervision; assist for line management. 1x seated rest break. Pt declining further mobility secondary to fatigue. Dyspnea 2/4 with SpO2 remained >89% on 4L O2 [place]  Stairs            Wheelchair Mobility    Modified Rankin (Stroke Patients Only)       Balance Overall balance assessment: Needs assistance   Sitting balance-Leahy Scale: Good       Standing balance-Leahy Scale: Fair Standing balance comment: Can static stand with no UE support and min guard                             Pertinent Vitals/Pain Pain Assessment: Faces Faces Pain Scale: No hurt    Home Living Family/patient expects to be discharged to:: Private residence Living Arrangements: Spouse/significant other;Children Available Help at Discharge: Family;Available 24 hours/day Type of Home: House Home Access: Stairs to enter Entrance Stairs-Rails: None Entrance Stairs-Number of Steps: 1 Home Layout: One level Home Equipment: Walker - 4 wheels;Shower seat      Prior Function Level of Independence: Independent with assistive device(s)   Gait / Transfers Assistance Needed: Has been limited the past few days secondary to increased dyspnea. Amb with intermittent use of rollator           Hand Dominance        Extremity/Trunk Assessment   Upper  Extremity Assessment Upper Extremity Assessment: Overall WFL for tasks assessed    Lower Extremity Assessment Lower Extremity Assessment: Generalized weakness    Cervical / Trunk Assessment Cervical / Trunk Assessment: Kyphotic  Communication   Communication: HOH  Cognition Arousal/Alertness: Awake/alert Behavior During Therapy: WFL for tasks assessed/performed Overall Cognitive Status: Within  Functional Limits for tasks assessed                                        General Comments General comments (skin integrity, edema, etc.): Wife present during session    Exercises     Assessment/Plan    PT Assessment Patient needs continued PT services  PT Problem List Decreased strength;Decreased balance;Decreased mobility;Cardiopulmonary status limiting activity       PT Treatment Interventions DME instruction;Gait training;Stair training;Functional mobility training;Therapeutic activities;Therapeutic exercise;Balance training;Patient/family education    PT Goals (Current goals can be found in the Care Plan section)  Acute Rehab PT Goals Patient Stated Goal: Better breathing PT Goal Formulation: With patient Time For Goal Achievement: 07/25/17 Potential to Achieve Goals: Good    Frequency Min 3X/week   Barriers to discharge        Co-evaluation               AM-PAC PT "6 Clicks" Daily Activity  Outcome Measure Difficulty turning over in bed (including adjusting bedclothes, sheets and blankets)?: A Little Difficulty moving from lying on back to sitting on the side of the bed? : A Little Difficulty sitting down on and standing up from a chair with arms (e.g., wheelchair, bedside commode, etc,.)?: A Little Help needed moving to and from a bed to chair (including a wheelchair)?: A Little Help needed walking in hospital room?: A Little Help needed climbing 3-5 steps with a railing? : A Little 6 Click Score: 18    End of Session Equipment Utilized During Treatment: Gait belt;Oxygen Activity Tolerance: Patient limited by fatigue Patient left: in chair;with call bell/phone within reach;with family/visitor present;Other (comment)(on BSC (RN aware)) Nurse Communication: Mobility status PT Visit Diagnosis: Other abnormalities of gait and mobility (R26.89)    Time: 3354-5625 PT Time Calculation (min) (ACUTE ONLY): 21 min   Charges:   PT  Evaluation $PT Eval Moderate Complexity: 1 Mod     PT G Codes:       Mabeline Caras, PT, DPT Acute Rehab Services  Pager: [place] 07/18/2017, 10:41 AM

## 2017-07-19 DIAGNOSIS — I25110 Atherosclerotic heart disease of native coronary artery with unstable angina pectoris: Secondary | ICD-10-CM

## 2017-07-19 DIAGNOSIS — I5023 Acute on chronic systolic (congestive) heart failure: Secondary | ICD-10-CM

## 2017-07-19 LAB — BASIC METABOLIC PANEL
ANION GAP: 11 (ref 5–15)
BUN: 46 mg/dL — ABNORMAL HIGH (ref 6–20)
CALCIUM: 8.5 mg/dL — AB (ref 8.9–10.3)
CO2: 29 mmol/L (ref 22–32)
Chloride: 89 mmol/L — ABNORMAL LOW (ref 101–111)
Creatinine, Ser: 1.76 mg/dL — ABNORMAL HIGH (ref 0.61–1.24)
GFR calc Af Amer: 45 mL/min — ABNORMAL LOW (ref >60)
GFR, EST NON AFRICAN AMERICAN: 39 mL/min — AB (ref >60)
Glucose, Bld: 129 mg/dL — ABNORMAL HIGH (ref 65–99)
POTASSIUM: 3.8 mmol/L (ref 3.5–5.1)
SODIUM: 129 mmol/L — AB (ref 135–145)

## 2017-07-19 LAB — GLUCOSE, CAPILLARY
GLUCOSE-CAPILLARY: 151 mg/dL — AB (ref 65–99)
GLUCOSE-CAPILLARY: 202 mg/dL — AB (ref 65–99)
Glucose-Capillary: 235 mg/dL — ABNORMAL HIGH (ref 65–99)
Glucose-Capillary: 129 mg/dL — ABNORMAL HIGH (ref 65–99)
Glucose-Capillary: 249 mg/dL — ABNORMAL HIGH (ref 65–99)
Glucose-Capillary: 248 mg/dL — ABNORMAL HIGH (ref 65–99)

## 2017-07-19 LAB — CBC
HCT: 32.7 % — ABNORMAL LOW (ref 39.0–52.0)
HEMOGLOBIN: 10.7 g/dL — AB (ref 13.0–17.0)
MCH: 28.4 pg (ref 26.0–34.0)
MCHC: 32.7 g/dL (ref 30.0–36.0)
MCV: 86.7 fL (ref 78.0–100.0)
PLATELETS: 276 10*3/uL (ref 150–400)
RBC: 3.77 MIL/uL — ABNORMAL LOW (ref 4.22–5.81)
RDW: 15 % (ref 11.5–15.5)
WBC: 8.4 10*3/uL (ref 4.0–10.5)

## 2017-07-19 LAB — HEPARIN LEVEL (UNFRACTIONATED): HEPARIN UNFRACTIONATED: 0.49 [IU]/mL (ref 0.30–0.70)

## 2017-07-19 MED ORDER — APIXABAN 5 MG PO TABS
5.0000 mg | ORAL_TABLET | Freq: Two times a day (BID) | ORAL | Status: DC
  Administered 2017-07-19 – 2017-08-09 (×43): 5 mg via ORAL
  Filled 2017-07-19 (×43): qty 1

## 2017-07-19 MED ORDER — POLYETHYLENE GLYCOL 3350 17 G PO PACK
17.0000 g | PACK | Freq: Two times a day (BID) | ORAL | Status: DC
  Administered 2017-07-19 – 2017-08-04 (×17): 17 g via ORAL
  Filled 2017-07-19 (×25): qty 1

## 2017-07-19 NOTE — Care Management Note (Addendum)
original note created by Tomi Bamberger RN CCM  Case Management Note  Patient Details  Name: LIEUTENANT ABARCA MRN: [id number] Date of Birth: 10/15/1950  Subjective/Objective:  From home with wife, s/p heart cath, unsuccessful pci, will txt medically, pta indep, he is a English as a second language teacher with [employer], he gets his medications from [place].  His PCP with the [facility] is [facility].   Fax number is [phone].  Wife is interested in getting a w/chair for patient.  NCM will need script to fax to [place] faxed w/chair order to Dr. Anda Latina today at 1604.     11/20 [place], BSN - volume overload, unsuccessful pci, consult for CVTS, but is poor candidate with high risk of lv apical thrombus. conts on lasix and hep drip.              Action/Plan: NCM will follow for dc needs.   Expected Discharge Date:                         Expected Discharge Plan:  Home/Self Care  In-House Referral:     Discharge planning Services  CM Consult  Post Acute Care Choice:    Choice offered to:     DME Arranged:    DME Agency:     HH Arranged:    HH Agency:     Status of Service:  In process, will continue to follow  If discussed at Long Length of Stay Meetings, dates discussed:    Additional Comments: 07/19/2017 3:55 pm NCM spoke to pt and son, Matt at bedside. Offered choice for HH/list provided. Son states he will let pt's wife/mother review. Pt has a bariatric RW at home. Pt may need oxygen. Will need walking oxygen sat. Will continue to follow for dc needs.  Jonnie Finner RN CCM Case Mgmt phone [phone]  Erenest Rasher, RN 07/19/2017, 3:53 PM

## 2017-07-19 NOTE — Progress Notes (Signed)
PROGRESS NOTE  ELOISE MULA OIN:[id number] DOB: Nov 06, 1950 DOA: 07/14/2017 PCP: Cleophas Dunker, MD  HPI/Recap of past 24 hours: Mr torrence is a 66 yo male with past medical hx significant for CAD with 3 vessels disease non amenable to PCI who presented on 07/14/17 at [facility] ED with complaints of chest pain. Found to have acute STEMI affecting the infra lateral leads transferred emergently to [facility]. Taken for emergent cath but no return to LAD and chronically occluded RCA. Unclear whether a candidate for CTS. Cardiology following.  Hospital course complicated by new onset A-fib, AKI, dyselectrolytemia, urinary retention. Other medical hx include obesity, type 2 diabetes, remote tobacco use (quit 3 years ago per his wife), COPD, HTN, HLD.   2D echo 07/14/17: Left ventricle: LVEF is approximately 40% with akinesis of the   distal inferoseptal wall and apex. Aneurysmal dilatation of apex.   Consider limited echo with contrast to evaluate for thrombus.   Limited echo with contrast has not been done due to AKI. Patient on heparin drip. AKI is improving. Defer limited echo to cardiology.  No acute events reported overnight. CTS will not take to surgery.   Patient denies chest pain, dyspnea or palpitations. On eliquis for new onset a-fib and diuretic due to fluid overload.     Assessment/Plan: Principal Problem:   STEMI (ST elevation myocardial infarction) ([place]) Active Problems:   COPD (chronic obstructive pulmonary disease) (HCC)   HTN (hypertension)   Diabetes ([place])   Morbid obesity (HCC)   OSA (obstructive sleep apnea)   Chronic diastolic CHF (congestive heart failure) (HCC)   Atrial fibrillation with rapid ventricular response (HCC)   STEMI involving left anterior descending coronary artery ([place])  CAD s/p STEMI -  -emergent cath 07/14/17 -chest pain free off of the nitroglycerine drip -medical management for this coronary artery disease -aspirin, Plavix, atorvastatin 40,  metoprolol tartrate 12.5 mg BID, isosorbide mononitrate, amiodarone -no ACE/ARB at this time given AKI -no plan for CABG, high risk surgery. -Awaiting CTS recommendations   Acute on chronic combined CHF -volume overload on exam with acute STEMI -continue diuresis 80 mg IV BID -negative 6.6 kg since admission -continue to monitor I&Os, daily weight, fluid restriction  HFREF 40% -echo 07/14/17 revealed LVEF is approximately 40% with akinesis of the   distal inferoseptal wall and apex. Aneurysmal dilatation of apex.   Consider limited echo with contrast to evaluate for thrombus -Defer to cardiology for limited echo -elquis started 07/19/17  AKI on CKD stage III  -improving -cr 1.76 from 1.94 from 2,29 from 2.74, baseline 1.50 -monitor urine output -avoid nephrotoxic agents -BMP am  Hypervolemic hyponatremia -improving -most likely dilutional  -na+ 129 from 126 from 129 -Fluid restriction -monitor urine intake and output - on lasix IV 80 mg BID - nephrology following -bmp am  Urinary retention -Improving, not on indwelling foley cath -continue to monitor urinary output  Normocytic anemia -stable; hg 10 -no sign of obvious bleeding -po iron supplement -CBC in am  New onset Afib with rvr -Now in sinus rhythm with rate controlled in the 80s -cardiology following. We appreciate recommendations -Long-term would plan clopidogrel and anticoagulant, stop aspirin after 30 days.   -continue amiodarone 360 mg x 4 weeks then decrease to maintenance dose to 200 mg daily. -CHADSVASC score 5 -eliquis started today 07/19/17  Diabetes  - A1c 7.9 - lantus and novolog  - ISS with hypoglycemic protocol  Hyperkalemia  -resolved - kayexalate(K 5.3)  Obesity  - Need  weight loss. - regular physical activity when stable and a healthy diet - cardiac rehab  Depression/anxiety - stale - zoloft  Former Tobacco use  - per his wife he quit 3 years ago - patient states he  quit smoking 9 years ago - Avoid tobacco   Code Status: Full  Family Communication: No family present at bedside  Disposition Plan: Will stay another midnight to continue to diurese, close monitoring, improvement of renal function/AKI.    Consultants:  Cardiology  nephrology  Procedures:  Left cardiac cath 07/16/17  Antimicrobials:  None indicated   DVT prophylaxis:  eliquis for new onset a-fib   Objective: Vitals:   07/19/17 0400 07/19/17 0500 07/19/17 0600 07/19/17 0625  BP: 135/73 135/66 (!) 153/77   Pulse: 61 63 62 (!) 57  Resp: 11 15 13 13   Temp: 97.7 F (36.5 C)     TempSrc: Oral     SpO2: 97% 96% 97% 97%  Weight:  (!) 136.4 kg (300 lb 11.3 oz)    Height:  6\' 1"  (1.854 m)      Intake/Output Summary (Last 24 hours) at 07/19/2017 7824 Last data filed at 07/19/2017 0500 Gross per 24 hour  Intake 2145 ml  Output 6300 ml  Net -4155 ml   Filed Weights   07/17/17 0829 07/18/17 0340 07/19/17 0500  Weight: (!) 139.3 kg (307 lb 1.6 oz) (!) 137 kg (302 lb) (!) 136.4 kg (300 lb 11.3 oz)    Exam:   General:  66 yo morbidly obese male sitting up in a chair NAD  Cardiovascular: RRR with no rubs or gallops  Respiratory: Mild rales at bases bilaterally. No wheezes noted  Abdomen: morbidly obese NBS x 4 quadrants, NT, ND  Musculoskeletal: 3+ pitting edema, could not palpate pulse due to severe edema  Skin: echymosis on forearm at site of venous punctures  Psychiatry: Mood is appropriate for condition and setting   Data Reviewed: CBC: Recent Labs  Lab 07/15/17 0235 07/16/17 0233 07/17/17 0248 07/18/17 0225 07/19/17 0221  WBC 15.5* 14.6* 10.7* 9.0 8.4  HGB 11.9* 10.2* 10.5* 9.7* 10.7*  HCT 36.6* 32.0* 33.0* 29.7* 32.7*  MCV 86.9 86.7 86.2 85.3 86.7  PLT 187 213 227 236 235   Basic Metabolic Panel: Recent Labs  Lab 07/15/17 1539 07/16/17 0559 07/17/17 0248 07/18/17 0225 07/19/17 0221  NA 130* 129* 126* 126* 129*  K 4.5 4.0 4.0 4.0 3.8    CL 92* 89* 87* 89* 89*  CO2 26 27 26 26 29   GLUCOSE 96 69 115* 117* 129*  BUN 44* 48* 50* 46* 46*  CREATININE 2.90* 2.74* 2.29* 1.94* 1.76*  CALCIUM 8.2* 8.0* 7.9* 7.9* 8.5*   GFR: Estimated Creatinine Clearance: 59.9 mL/min (A) (by C-G formula based on SCr of 1.76 mg/dL (H)). Liver Function Tests: No results for input(s): AST, ALT, ALKPHOS, BILITOT, PROT, ALBUMIN in the last 168 hours. No results for input(s): LIPASE, AMYLASE in the last 168 hours. No results for input(s): AMMONIA in the last 168 hours. Coagulation Profile: No results for input(s): INR, PROTIME in the last 168 hours. Cardiac Enzymes: Recent Labs  Lab 07/14/17 0922 07/14/17 1559 07/14/17 1929 07/15/17 0235 07/15/17 1036  TROPONINI 20.79* 16.59* 17.03* 22.73* 18.41*   BNP (last 3 results) No results for input(s): PROBNP in the last 8760 hours. HbA1C: No results for input(s): HGBA1C in the last 72 hours. CBG: Recent Labs  Lab 07/17/17 1241 07/17/17 1642 07/17/17 2108 07/18/17 0843 07/18/17 2114  GLUCAP 158* 137*  129* 88 172*   Lipid Profile: No results for input(s): CHOL, HDL, LDLCALC, TRIG, CHOLHDL, LDLDIRECT in the last 72 hours. Thyroid Function Tests: No results for input(s): TSH, T4TOTAL, FREET4, T3FREE, THYROIDAB in the last 72 hours. Anemia Panel: No results for input(s): VITAMINB12, FOLATE, FERRITIN, TIBC, IRON, RETICCTPCT in the last 72 hours. Urine analysis:    Component Value Date/Time   COLORURINE AMBER (A) 07/15/2017 1651   APPEARANCEUR CLOUDY (A) 07/15/2017 1651   LABSPEC 1.038 (H) 07/15/2017 1651   PHURINE 5.0 07/15/2017 1651   GLUCOSEU 50 (A) 07/15/2017 1651   HGBUR NEGATIVE 07/15/2017 1651   BILIRUBINUR NEGATIVE 07/15/2017 1651   KETONESUR NEGATIVE 07/15/2017 1651   PROTEINUR >=300 (A) 07/15/2017 1651   NITRITE NEGATIVE 07/15/2017 1651   LEUKOCYTESUR NEGATIVE 07/15/2017 1651   Sepsis Labs: @LABRCNTIP (procalcitonin:4,lacticidven:4)  ) Recent Results (from the past 240  hour(s))  MRSA PCR Screening     Status: None   Collection Time: 07/14/17 12:13 PM  Result Value Ref Range Status   MRSA by PCR NEGATIVE NEGATIVE Final    Comment:        The GeneXpert MRSA Assay (FDA approved for NASAL specimens only), is one component of a comprehensive MRSA colonization surveillance program. It is not intended to diagnose MRSA infection nor to guide or monitor treatment for MRSA infections.       Studies: No results found.  Scheduled Meds: . amiodarone  200 mg Oral BID  . aspirin EC  81 mg Oral Daily  . atorvastatin  40 mg Oral q1800  . chlorhexidine  15 mL Mouth Rinse BID  . clopidogrel  75 mg Oral Daily  . ferrous sulfate  325 mg Oral TID WC  . furosemide  80 mg Intravenous BID  . Influenza vac split quadrivalent PF  0.5 mL Intramuscular Tomorrow-1000  . insulin aspart  0-5 Units Subcutaneous QHS  . insulin aspart  0-9 Units Subcutaneous TID WC  . insulin glargine  25 Units Subcutaneous Daily  . insulin glargine  30 Units Subcutaneous QHS  . isosorbide mononitrate  30 mg Oral Daily  . magnesium oxide  400 mg Oral Daily  . mouth rinse  15 mL Mouth Rinse q12n4p  . metoprolol tartrate  25 mg Oral BID  . omega-3 acid ethyl esters  2 g Oral Q breakfast  . omega-3 acid ethyl esters  3 g Oral BID AC  . senna-docusate  1 tablet Oral BID  . sertraline  100 mg Oral Daily  . sodium chloride flush  3 mL Intravenous Q12H  . tiotropium  18 mcg Inhalation QHS    Continuous Infusions: . sodium chloride    . heparin 2,500 Units/hr (07/19/17 0544)     LOS: 5 days     Kayleen Memos, MD [facility] Pager [phone]  If 7PM-7AM, please contact night-coverage [email or web address] Password [email or web address] 07/19/2017, 7:11 AM

## 2017-07-19 NOTE — Progress Notes (Signed)
ANTICOAGULATION CONSULT NOTE - Follow Up Consult  Pharmacy Consult for heparin  Indication: afib, high risk for LV thrombus  Allergies  Allergen Reactions  . Simvastatin     Muscle weakness    Patient Measurements: Height: 6\' 1"  (185.4 cm) Weight: (!) 300 lb 11.3 oz (136.4 kg) IBW/kg (Calculated) : 79.9 Heparin Dosing Weight: 110kg  Vital Signs: Temp: 97.6 F (36.4 C) (11/20 0808) Temp Source: Oral (11/20 0808) BP: 166/74 (11/20 0700) Pulse Rate: 58 (11/20 0806)  Labs: Recent Labs    07/17/17 0248 07/18/17 0225 07/19/17 0221  HGB 10.5* 9.7* 10.7*  HCT 33.0* 29.7* 32.7*  PLT 227 236 276  HEPARINUNFRC 0.47 0.43 0.49  CREATININE 2.29* 1.94* 1.76*    Estimated Creatinine Clearance: 59.9 mL/min (A) (by C-G formula based on SCr of 1.76 mg/dL (H)).  Assessment: 66 yo male s/p cath with 3VCAD and afib on heparin.   Heparin continues today as renal function is followed closely, plan to start oral anticoagulation of DOAC vs warfarin depending on renal recovery. Scr down to 1.76 with good uop overnight.  Heparin level continues to be at goal this am. CBC stable. No bleeding issues noted.  Goal of Therapy:  Heparin level 0.3-0.7 units/ml Monitor platelets by anticoagulation protocol: Yes   Plan:  Continue heparin at 2500 units/hr Heparin level daily wth CBC daily  Angus Seller, PharmD Pharmacy Resident 07/19/2017 10:31 AM

## 2017-07-19 NOTE — Progress Notes (Signed)
Progress Note  Patient Name: Jose [name] Date of Encounter: 07/19/2017  Primary Cardiologist: Harl Bowie  Subjective   Patient states he continues to have improvement in how he feels and his shortness of breath. He denies chest pain, dizziness, palpitations. He is still constipated. He has not ambulated much at all since admission.  Inpatient Medications    Scheduled Meds: . amiodarone  200 mg Oral BID  . aspirin EC  81 mg Oral Daily  . atorvastatin  40 mg Oral q1800  . chlorhexidine  15 mL Mouth Rinse BID  . clopidogrel  75 mg Oral Daily  . ferrous sulfate  325 mg Oral TID WC  . furosemide  80 mg Intravenous BID  . Influenza vac split quadrivalent PF  0.5 mL Intramuscular Tomorrow-1000  . insulin aspart  0-5 Units Subcutaneous QHS  . insulin aspart  0-9 Units Subcutaneous TID WC  . insulin glargine  25 Units Subcutaneous Daily  . insulin glargine  30 Units Subcutaneous QHS  . isosorbide mononitrate  30 mg Oral Daily  . magnesium oxide  400 mg Oral Daily  . mouth rinse  15 mL Mouth Rinse q12n4p  . metoprolol tartrate  25 mg Oral BID  . omega-3 acid ethyl esters  2 g Oral Q breakfast  . omega-3 acid ethyl esters  3 g Oral BID AC  . senna-docusate  1 tablet Oral BID  . sertraline  100 mg Oral Daily  . sodium chloride flush  3 mL Intravenous Q12H  . tiotropium  18 mcg Inhalation QHS   Continuous Infusions: . sodium chloride    . heparin 2,500 Units/hr (07/19/17 0600)   PRN Meds: sodium chloride, acetaminophen, albuterol, morphine injection, nitroGLYCERIN, ondansetron (ZOFRAN) IV, oxyCODONE-acetaminophen, sodium chloride flush   Vital Signs    Vitals:   07/19/17 0400 07/19/17 0500 07/19/17 0600 07/19/17 0625  BP: 135/73 135/66 (!) 153/77   Pulse: 61 63 62 (!) 57  Resp: 11 15 13 13   Temp: 97.7 F (36.5 C)     TempSrc: Oral     SpO2: 97% 96% 97% 97%  Weight:  (!) 300 lb 11.3 oz (136.4 kg)    Height:  6\' 1"  (1.854 m)      Intake/Output Summary (Last 24 hours) at  07/19/2017 0727 Last data filed at 07/19/2017 0700 Gross per 24 hour  Intake 2220 ml  Output 6300 ml  Net -4080 ml   Filed Weights   07/17/17 0829 07/18/17 0340 07/19/17 0500  Weight: (!) 307 lb 1.6 oz (139.3 kg) (!) 302 lb (137 kg) (!) 300 lb 11.3 oz (136.4 kg)    Telemetry    Sinus rhythm with occasional PVCs - Personally Reviewed 07/19/17  ECG    N/a - Personally Reviewed  Physical Exam   GEN: No acute distress, sitting up in chair.   Neck: hard to evaluate JVD due to obesity Cardiac: RRR, no murmurs, rubs, or gallops.  Respiratory: Clear to auscultation bilaterally, but very diminished breath sounds bilaterally GI: Soft, nontender; distended and tight  MS: 2+ pitting edema to thighs though improved Neuro:  Nonfocal  Psych: Normal affect   Labs    Chemistry Recent Labs  Lab 07/17/17 0248 07/18/17 0225 07/19/17 0221  NA 126* 126* 129*  K 4.0 4.0 3.8  CL 87* 89* 89*  CO2 26 26 29   GLUCOSE 115* 117* 129*  BUN 50* 46* 46*  CREATININE 2.29* 1.94* 1.76*  CALCIUM 7.9* 7.9* 8.5*  GFRNONAA 28* 34* 39*  GFRAA 33* 40* 45*  ANIONGAP 13 11 11      Hematology Recent Labs  Lab 07/17/17 0248 07/18/17 0225 07/19/17 0221  WBC 10.7* 9.0 8.4  RBC 3.83* 3.48* 3.77*  HGB 10.5* 9.7* 10.7*  HCT 33.0* 29.7* 32.7*  MCV 86.2 85.3 86.7  MCH 27.4 27.9 28.4  MCHC 31.8 32.7 32.7  RDW 15.1 14.7 15.0  PLT 227 236 276    Cardiac Enzymes Recent Labs  Lab 07/14/17 1559 07/14/17 1929 07/15/17 0235 07/15/17 1036  TROPONINI 16.59* 17.03* 22.73* 18.41*    Recent Labs  Lab 07/14/17 0931  TROPIPOC 18.10*     BNP Recent Labs  Lab 07/15/17 0235  BNP 258.5*     DDimer No results for input(s): DDIMER in the last 168 hours.   Radiology    Dg Chest Port 1 View  Result Date: 07/17/2017 CLINICAL DATA:  Dyspnea EXAM: PORTABLE CHEST 1 VIEW COMPARISON:  Chest x-ray dated 03/17/2016. FINDINGS: Ill-defined opacity at the right lung base. Stable cardiomegaly. Mild  bilateral interstitial prominence is stable, presumably chronic. IMPRESSION: 1. Ill-defined opacity at the right lung base, pneumonia versus asymmetric edema. If afebrile, favor asymmetric pulmonary edema. 2. Cardiomegaly with bilateral interstitial edema suggesting mild CHF. Appearance is similar to the previous study of 03/17/2016, compatible with a mild chronic CHF. Electronically Signed   By: Franki Cabot M.D.   On: 07/17/2017 10:44    Cardiac Studies    Cardiac cath July 14, 2017  -unsuccessful attempted PTCA of occluded distal LAD, chronic occlusion of RCA Diagnostic Diagram       Post-Intervention Diagram        Echo July 14, 2017 - Left ventricle: LVEF is approximately 40% with akinesis of the distal inferoseptal wall and apex. Aneurysmal dilatation of apex. Consider limited echo with contrast to evaluate for thrombus. The cavity size was normal. Wall thickness was increased in a pattern of mild LVH. - Mitral valve: Calcified annulus. Mildly thickened leaflets .  Patient Profile     66 y.o. male presenting with extensive anteroapical and inferior infarction, unsuccessful attempt at revascularization of occluded distal LAD, chronic occlusion of RCA.   Moderate reduction in ventricular systolic function.   Atrial fibrillation with rapid ventricular response present on arrival, resolved with amiodarone.   Moderate acute renal failure related to contrast nephrotoxicity/retention, improving.    Assessment & Plan    CAD s/p STEMI:  Unsuccessful PCI to distal LAD occlusion; echo consistent with aneurysmal dilation of apex. On heparin (for STEMI, a fib, and risk of LV thrombus in setting of aneurysmal dilation). --asa 81mg  daily x 1 month, plavix 75mg  daily, atorvastatin 40mg  daily, lopresor 25mg  BID --heparin gtt > can switch to NOAC today  Acute on chronic systolic CHF:  Net neg ~8H yesterday, down 2lbs in weight with improvement of renal  function. --continue IV lasix 80mg  BID --holding on starting ACE-I until renal function stabilizes  AFib:  Maintaining sinus rhythm. Plan current dose of amiodarone 4 weeks (200mg  BID), then decrease to maintenance dose of 200 mg daily CHADSVasc 5 (age, hypertension, diabetes, heart failure, CAD). --amiodarone 200mg  BID --can start eliquis (full dose) today and stop heparin  Acute renal failure on CKD stage III:   Continues to improve with Cr 1.76 today. Etiology likely combo of contrast nephrotoxicity and urinary retention.   DM:  No further hypoglycemia.  Urinary retention:  Resolvoed. Currently without indwelling catheter, monitor for retention  COPD/Smoking:  Strongly recommend permanent smoking cessation.  Hyponatremia: Improving to  129 today. --fluid restriction, diuresis  Is likely stable to transfer out of ICU.  For questions or updates, please contact [place] Please consult [email or web address] for contact info under Cardiology/STEMI.   Signed, Alphonzo Grieve, MD  07/19/2017, 7:27 AM    I have personally seen and examined this patient with Dr. Jari Favre. I agree with the assessment and plan as outlined above. He is doing well this am. He has acute on chronic CHF. He is diuresing well but still volume overloaded. Will continue IV Lasix today. CAD is stable, no chest pain. Will continue management of CAD with ASA, Plavix, statin and beta blocker. He has an LV apical aneurysm and PAF. Will convert heparin drip to Eliquis today. Likely 2-3 more days of hospitalization prior to discharge.   Lauree Chandler 07/19/2017 9:58 AM

## 2017-07-19 NOTE — Progress Notes (Signed)
Patient refusing the use of CPAP for tonight. RT informed patient if he changes his mind have RN contact RT. RN aware.

## 2017-07-19 NOTE — Care Management Important Message (Signed)
Important Message  Patient Details  Name: Jose [name] MRN: [id number] Date of Birth: 1951/06/03   Medicare Important Message Given:  Yes    Erenest Rasher, RN 07/19/2017, 3:53 PM

## 2017-07-19 NOTE — Progress Notes (Signed)
RT set up pt on cpap with hospital mask size Lg on home unit and connected pt to O2, 4L bled in, and refilled water chamber. Pt tolerating well. RT to cont to monitor.

## 2017-07-20 DIAGNOSIS — I480 Paroxysmal atrial fibrillation: Secondary | ICD-10-CM

## 2017-07-20 DIAGNOSIS — I255 Ischemic cardiomyopathy: Secondary | ICD-10-CM

## 2017-07-20 LAB — CBC WITH DIFFERENTIAL/PLATELET
BASOS ABS: 0 10*3/uL (ref 0.0–0.1)
Basophils Relative: 0 %
Eosinophils Absolute: 0.3 10*3/uL (ref 0.0–0.7)
Eosinophils Relative: 3 %
HEMATOCRIT: 35.3 % — AB (ref 39.0–52.0)
Hemoglobin: 11.1 g/dL — ABNORMAL LOW (ref 13.0–17.0)
LYMPHS ABS: 1.2 10*3/uL (ref 0.7–4.0)
LYMPHS PCT: 12 %
MCH: 28 pg (ref 26.0–34.0)
MCHC: 31.4 g/dL (ref 30.0–36.0)
MCV: 88.9 fL (ref 78.0–100.0)
MONO ABS: 0.8 10*3/uL (ref 0.1–1.0)
Monocytes Relative: 8 %
NEUTROS ABS: 7.8 10*3/uL — AB (ref 1.7–7.7)
Neutrophils Relative %: 77 %
Platelets: 283 10*3/uL (ref 150–400)
RBC: 3.97 MIL/uL — AB (ref 4.22–5.81)
RDW: 14.8 % (ref 11.5–15.5)
WBC: 10 10*3/uL (ref 4.0–10.5)

## 2017-07-20 LAB — GLUCOSE, CAPILLARY
GLUCOSE-CAPILLARY: 220 mg/dL — AB (ref 65–99)
GLUCOSE-CAPILLARY: 255 mg/dL — AB (ref 65–99)
Glucose-Capillary: 208 mg/dL — ABNORMAL HIGH (ref 65–99)
Glucose-Capillary: 274 mg/dL — ABNORMAL HIGH (ref 65–99)
Glucose-Capillary: 219 mg/dL — ABNORMAL HIGH (ref 65–99)

## 2017-07-20 LAB — BASIC METABOLIC PANEL
ANION GAP: 11 (ref 5–15)
BUN: 37 mg/dL — ABNORMAL HIGH (ref 6–20)
CHLORIDE: 89 mmol/L — AB (ref 101–111)
CO2: 31 mmol/L (ref 22–32)
Calcium: 8.8 mg/dL — ABNORMAL LOW (ref 8.9–10.3)
Creatinine, Ser: 1.55 mg/dL — ABNORMAL HIGH (ref 0.61–1.24)
GFR calc Af Amer: 52 mL/min — ABNORMAL LOW (ref >60)
GFR, EST NON AFRICAN AMERICAN: 45 mL/min — AB (ref >60)
GLUCOSE: 215 mg/dL — AB (ref 65–99)
POTASSIUM: 4.7 mmol/L (ref 3.5–5.1)
Sodium: 131 mmol/L — ABNORMAL LOW (ref 135–145)

## 2017-07-20 LAB — HEPARIN LEVEL (UNFRACTIONATED)

## 2017-07-20 MED ORDER — METOPROLOL SUCCINATE ER 25 MG PO TB24
25.0000 mg | ORAL_TABLET | Freq: Every day | ORAL | Status: DC
  Administered 2017-07-21 – 2017-07-23 (×3): 25 mg via ORAL
  Filled 2017-07-20 (×3): qty 1

## 2017-07-20 MED ORDER — HYDRALAZINE HCL 25 MG PO TABS
25.0000 mg | ORAL_TABLET | Freq: Three times a day (TID) | ORAL | Status: DC
  Administered 2017-07-20 – 2017-07-22 (×6): 25 mg via ORAL
  Filled 2017-07-20 (×6): qty 1

## 2017-07-20 MED ORDER — BISACODYL 5 MG PO TBEC
10.0000 mg | DELAYED_RELEASE_TABLET | Freq: Once | ORAL | Status: AC
  Administered 2017-07-20: 10 mg via ORAL
  Filled 2017-07-20: qty 2

## 2017-07-20 MED ORDER — SENNOSIDES-DOCUSATE SODIUM 8.6-50 MG PO TABS
2.0000 | ORAL_TABLET | Freq: Two times a day (BID) | ORAL | Status: DC
  Administered 2017-07-20 – 2017-07-21 (×2): 2 via ORAL
  Filled 2017-07-20 (×2): qty 2

## 2017-07-20 NOTE — Progress Notes (Signed)
Physical Therapy Treatment Patient Details Name: Jose [name] MRN: [id number] DOB: [birth year] Today's Date: 07/20/2017    History of Present Illness Pt is a 66 y.o. male who presented to [facility] ED on 07/14/17 with chest pain and dyspnea. On arrival to [facility] ED, his EKG showed new onset A-fib with ST elevation in the inferolateral leads; CODE STEMI was called. Cardiac cath 11/15 with unsuccessful attempt at revascularization of occluded distal LAD, chronic occlusion of RCA. Pertinent PMH includes CAD (CTO of RCA, with residual disease in LAD/Lcx), COPD, HL, HTN, obesity,DMand tobacco use.    PT Comments    Pt making steady progress with mobility, but remains limited by dyspnea, O2 needs, and fatigue with ambulating short distances. Declined further amb after seated rest break secondary to fatigue. SpO2 remained >90% on 3L O2 [place]. Will continue to follow acutely.   Follow Up Recommendations  Home health PT;Supervision for mobility/OOB     Equipment Recommendations  None recommended by PT    Recommendations for Other Services       Precautions / Restrictions Precautions Precautions: Fall Restrictions Weight Bearing Restrictions: No    Mobility  Bed Mobility               General bed mobility comments: Received sitting up  Transfers Overall transfer level: Needs assistance Equipment used: Rolling walker (2 wheeled) Transfers: Sit to/from Stand Sit to Stand: Supervision         General transfer comment: Supervision for safety, reliant on momentum to power into standing position  Ambulation/Gait Ambulation/Gait assistance: Supervision Ambulation Distance (Feet): 100 Feet Assistive device: Rolling walker (2 wheeled) Gait Pattern/deviations: Step-to pattern;Wide base of support Gait velocity: Decreased Gait velocity interpretation: <1.8 ft/sec, indicative of risk for recurrent falls General Gait Details: Supervision for safety with intermittent cues for upright posture  and standing closer to RW. SpO2 >90% on 3L O2 [place]. Pt declining further mobility secondary to fatigue   Stairs            Wheelchair Mobility    Modified Rankin (Stroke Patients Only)       Balance Overall balance assessment: Needs assistance Sitting-balance support: No upper extremity supported;Feet supported Sitting balance-Leahy Scale: Good     Standing balance support: Bilateral upper extremity supported;No upper extremity supported;During functional activity Standing balance-Leahy Scale: Fair Standing balance comment: Can static stand with no UE support and min guard                            Cognition Arousal/Alertness: Awake/alert Behavior During Therapy: WFL for tasks assessed/performed Overall Cognitive Status: Within Functional Limits for tasks assessed                                        Exercises      General Comments General comments (skin integrity, edema, etc.): Son present throughout session      Pertinent Vitals/Pain Pain Assessment: Faces Faces Pain Scale: No hurt    Home Living Family/patient expects to be discharged to:: Private residence Living Arrangements: Spouse/significant other;Children Available Help at Discharge: Family;Available 24 hours/day Type of Home: House Home Access: Stairs to enter Entrance Stairs-Rails: None Home Layout: One level Home Equipment: Environmental consultant - 4 wheels;Shower seat      Prior Function Level of Independence: Independent with assistive device(s)  Gait / Transfers Assistance Needed: Has been limited the  past few days secondary to increased dyspnea. Amb with intermittent use of rollator       PT Goals (current goals can now be found in the care plan section) Acute Rehab PT Goals Patient Stated Goal: Better breathing and home PT Goal Formulation: With patient Time For Goal Achievement: 07/27/17 Potential to Achieve Goals: Good Progress towards PT goals: Progressing toward  goals    Frequency    Min 3X/week      PT Plan Current plan remains appropriate    Co-evaluation              AM-PAC PT "6 Clicks" Daily Activity  Outcome Measure  Difficulty turning over in bed (including adjusting bedclothes, sheets and blankets)?: A Little Difficulty moving from lying on back to sitting on the side of the bed? : A Little Difficulty sitting down on and standing up from a chair with arms (e.g., wheelchair, bedside commode, etc,.)?: A Little Help needed moving to and from a bed to chair (including a wheelchair)?: A Little Help needed walking in hospital room?: A Little Help needed climbing 3-5 steps with a railing? : A Little 6 Click Score: 18    End of Session Equipment Utilized During Treatment: Gait belt;Oxygen Activity Tolerance: Patient limited by fatigue Patient left: in chair;with call bell/phone within reach;with family/visitor present Nurse Communication: Mobility status;Patient requests pain meds PT Visit Diagnosis: Other abnormalities of gait and mobility (R26.89)     Time: 6579-0383 PT Time Calculation (min) (ACUTE ONLY): 18 min  Charges:  $Gait Training: 8-22 mins                    G Codes:      Mabeline Caras, PT, DPT Acute Rehab Services  Pager: [place] 07/20/2017, 5:26 PM

## 2017-07-20 NOTE — Progress Notes (Signed)
PROGRESS NOTE    Jose [name]  TOI:[id number] DOB: 06-Sep-1950 DOA: 07/14/2017 PCP: Cleophas Dunker, MD    Brief Narrative:  66 year old male who presents with chest pain and dyspnea. He is known to have coronary artery disease, COPD, dyslipidemia, type 2 diabetes mellitus,hypertension, obesity and tobacco abuse. He had chest pain intermittently for last 3 days prior to hospitalization, associated with dyspnea on exertion. Due to his persistent symptoms he was brought to the hospital, he was found in atrial fibrillation and his electrocardiogram showed ST elevations in the inferior lateral leads, his troponin was 20, and he was immediately taken to the cardiac catheterization lab. On his initial physical examination blood pressure 115/57, heart rate 52, temperature 98.9, oxygen saturation 93% his lungs were clear to auscultation bilaterally heart S1-S2 present, irregularly irregular no S3 or S4 gallop, no murmurs, his abdomen was soft nontender, 1+ lower extremity edema  Cardiac catheterization he was found to have total occlusion of the mid and apical LAD, he underwent  Unsuccessful PCI to the LAD. He also was found to have chronic occlusion of the RCA, ejection fraction is 40%.   He developed acute kidney injury. He was placed on amiodarone and started on aggressive medical therapy for his coronary artery disease. Likely not a good surgical candidate.   Assessment & Plan:   Principal Problem:   STEMI (ST elevation myocardial infarction) ([place]) Active Problems:   COPD (chronic obstructive pulmonary disease) (HCC)   HTN (hypertension)   Diabetes (HCC)   Morbid obesity (HCC)   OSA (obstructive sleep apnea)   Chronic diastolic CHF (congestive heart failure) (HCC)   Atrial fibrillation with rapid ventricular response ([place])   STEMI involving left anterior descending coronary artery ([place])   1. Coronary disease status post ST elevation myocardial infarction. Patient is chest pain free, will  continue aggressive medical therapy with dual antiplatelet therapy with asa and clopidogrel, isosorbide, hydralazine, metoprolol, and atorvastatin.   2. Atrial fibrillation, new onset. Rate controlled, will continue rate control with amiodarone and metoprolol, anticoagulation with apixaban.   3. Systolic heart failure, acute on chronic. Persistent edema, will continue diuresis with IV furosemide at 80 mg IV bid, urine output 4,010 over last 24 hours. Will continue blood pressure control.   4. Acute kidney injury chronic kidney disease stage III. Serum cr up to 1,55, from 1,76, with serum K at 4,7, will continue diuresis with loop diuretics, and will follow on renal panel in am.   5. Type 2 diabetes mellitus. Will continue glucose cover and monitoring with insulin sliding scale, capillary glucose 202, 248, 208, 220, 255. Patient tolerating po well. She is on a total of 55 untis of insulin lantus.   6. COPD. Stable with no signs of exacerbation, will continue as needed albuterol and daily tiotropium.   DVT prophylaxis: apixaban  Code Status: full Family Communication: I spoke with patient's family at the bedside and all questions were addressed. Disposition Plan: Home   Consultants:     Procedures:   Cardiac catheterization   Antimicrobials:      Subjective: Patient feeling very weak and deconditioned, no chest pain or dyspnea, positive lower extremity edema, no nausea or vomiting, no chest pain or palpitations.   Objective: Vitals:   07/20/17 0500 07/20/17 0530 07/20/17 0600 07/20/17 0630  BP:    (!) 142/62  Pulse: 61  62 (!) 56  Resp: 13  17 18   Temp:      TempSrc:      SpO2: 92%  91% 98%  Weight:  134.9 kg (297 lb 6.4 oz)    Height:        Intake/Output Summary (Last 24 hours) at 07/20/2017 0834 Last data filed at 07/20/2017 0500 Gross per 24 hour  Intake 770 ml  Output 4010 ml  Net -3240 ml   Filed Weights   07/18/17 0340 07/19/17 0500 07/20/17 0530  Weight:  (!) 137 kg (302 lb) (!) 136.4 kg (300 lb 11.3 oz) 134.9 kg (297 lb 6.4 oz)    Examination:   General: Not in pain or dyspnea. deconditioned Neurology: Awake and alert, non focal  E ENT: no pallor, no icterus, oral mucosa moist Cardiovascular: No JVD. S1-S2 present, rhythmic, no gallops, rubs, or murmurs. ++++ pitting lower extremity edema. Pulmonary: decreased breath sounds bilaterally at the dependent zones, no wheezing, rhonchi or rales. Gastrointestinal. Abdomen protuberant, no organomegaly, non tender, no rebound or guarding Skin. No rashes Musculoskeletal: no joint deformities     Data Reviewed: I have personally reviewed following labs and imaging studies  CBC: Recent Labs  Lab 07/16/17 0233 07/17/17 0248 07/18/17 0225 07/19/17 0221 07/20/17 0602  WBC 14.6* 10.7* 9.0 8.4 10.0  NEUTROABS  --   --   --   --  7.8*  HGB 10.2* 10.5* 9.7* 10.7* 11.1*  HCT 32.0* 33.0* 29.7* 32.7* 35.3*  MCV 86.7 86.2 85.3 86.7 88.9  PLT 213 227 236 276 932   Basic Metabolic Panel: Recent Labs  Lab 07/16/17 0559 07/17/17 0248 07/18/17 0225 07/19/17 0221 07/20/17 0602  NA 129* 126* 126* 129* 131*  K 4.0 4.0 4.0 3.8 4.7  CL 89* 87* 89* 89* 89*  CO2 27 26 26 29 31   GLUCOSE 69 115* 117* 129* 215*  BUN 48* 50* 46* 46* 37*  CREATININE 2.74* 2.29* 1.94* 1.76* 1.55*  CALCIUM 8.0* 7.9* 7.9* 8.5* 8.8*   GFR: Estimated Creatinine Clearance: 67.6 mL/min (A) (by C-G formula based on SCr of 1.55 mg/dL (H)). Liver Function Tests: No results for input(s): AST, ALT, ALKPHOS, BILITOT, PROT, ALBUMIN in the last 168 hours. No results for input(s): LIPASE, AMYLASE in the last 168 hours. No results for input(s): AMMONIA in the last 168 hours. Coagulation Profile: No results for input(s): INR, PROTIME in the last 168 hours. Cardiac Enzymes: Recent Labs  Lab 07/14/17 0922 07/14/17 1559 07/14/17 1929 07/15/17 0235 07/15/17 1036  TROPONINI 20.79* 16.59* 17.03* 22.73* 18.41*   BNP (last 3  results) No results for input(s): PROBNP in the last 8760 hours. HbA1C: No results for input(s): HGBA1C in the last 72 hours. CBG: Recent Labs  Lab 07/19/17 1113 07/19/17 1634 07/19/17 2121 07/20/17 0536 07/20/17 0805  GLUCAP 249* 202* 248* 208* 220*   Lipid Profile: No results for input(s): CHOL, HDL, LDLCALC, TRIG, CHOLHDL, LDLDIRECT in the last 72 hours. Thyroid Function Tests: No results for input(s): TSH, T4TOTAL, FREET4, T3FREE, THYROIDAB in the last 72 hours. Anemia Panel: No results for input(s): VITAMINB12, FOLATE, FERRITIN, TIBC, IRON, RETICCTPCT in the last 72 hours.    Radiology Studies: I have reviewed all of the imaging during this hospital visit personally     Scheduled Meds: . amiodarone  200 mg Oral BID  . apixaban  5 mg Oral BID  . aspirin EC  81 mg Oral Daily  . atorvastatin  40 mg Oral q1800  . chlorhexidine  15 mL Mouth Rinse BID  . clopidogrel  75 mg Oral Daily  . ferrous sulfate  325 mg Oral TID WC  . furosemide  80 mg Intravenous BID  . Influenza vac split quadrivalent PF  0.5 mL Intramuscular Tomorrow-1000  . insulin aspart  0-5 Units Subcutaneous QHS  . insulin aspart  0-9 Units Subcutaneous TID WC  . insulin glargine  25 Units Subcutaneous Daily  . insulin glargine  30 Units Subcutaneous QHS  . isosorbide mononitrate  30 mg Oral Daily  . magnesium oxide  400 mg Oral Daily  . mouth rinse  15 mL Mouth Rinse q12n4p  . metoprolol tartrate  25 mg Oral BID  . omega-3 acid ethyl esters  2 g Oral Q breakfast  . omega-3 acid ethyl esters  3 g Oral BID AC  . polyethylene glycol  17 g Oral BID  . sertraline  100 mg Oral Daily  . sodium chloride flush  3 mL Intravenous Q12H  . tiotropium  18 mcg Inhalation QHS   Continuous Infusions: . sodium chloride       LOS: 6 days        Tane Biegler Gerome Apley, MD [facility] Pager [phone]

## 2017-07-20 NOTE — Evaluation (Signed)
Occupational Therapy Evaluation Patient Details Name: Jose [name] MRN: [id number] DOB: 1951/02/01 Today's Date: 07/20/2017    History of Present Illness Pt is a 66 y.o. male who presented to [facility] on 07/14/17 with chest pain and dyspnea. On arrival to [facility] ED, his EKG showed new onset A-fib with ST elevation in the inferolateral leads; CODE STEMI was called. Cardiac cath 11/15 with unsuccessful attempt at revascularization of occluded distal LAD, chronic occlusion of RCA. Pertinent PMH includes CAD (CTO of RCA, with residual disease in LAD/Lcx), COPD, HL, HTN, obesity,DMand tobacco use.    Clinical Impression   PTA, pt reports modified independence with ADL and functional mobility. Pt currently limited by decreased activity tolerance for ADL. He currently requires overall min assist for LB ADL and toilet transfers. Attempted ambulation to bathroom on RA with pt demonstrating O2 desaturation to 85% and replaced 3L O2 with quick rebound to 96% with pursed lip breathing techniques. Pt would benefit from continued OT services while admitted to improve independence and safety with ADL and functional mobility. Recommend 24 hour assistance as well as home health OT follow-up post-acute D/C.    Follow Up Recommendations  Home health OT;Supervision/Assistance - 24 hour    Equipment Recommendations  3 in 1 bedside commode    Recommendations for Other Services       Precautions / Restrictions Precautions Precautions: Fall Restrictions Weight Bearing Restrictions: No      Mobility Bed Mobility               General bed mobility comments: Received sitting in chair  Transfers Overall transfer level: Needs assistance Equipment used: Rolling walker (2 wheeled) Transfers: Sit to/from Stand Sit to Stand: Supervision         General transfer comment: Stood x2 with RW and supervision for safety; reliant on momentum to power up into standing    Balance Overall balance assessment:  Needs assistance Sitting-balance support: No upper extremity supported;Feet supported Sitting balance-Leahy Scale: Good     Standing balance support: Bilateral upper extremity supported;No upper extremity supported;During functional activity Standing balance-Leahy Scale: Fair Standing balance comment: Can static stand with no UE support and min guard                           ADL either performed or assessed with clinical judgement   ADL Overall ADL's : Needs assistance/impaired Eating/Feeding: Set up;Sitting   Grooming: Set up;Sitting   Upper Body Bathing: Min guard;Sitting   Lower Body Bathing: Minimal assistance;Sit to/from stand   Upper Body Dressing : Min guard;Sitting   Lower Body Dressing: Minimal assistance;Sit to/from stand   Toilet Transfer: Minimal assistance;Ambulation;RW;Comfort height toilet;Grab bars;Min guard   Toileting- Clothing Manipulation and Hygiene: Sit to/from stand;Minimal assistance(using grab bars)       Functional mobility during ADLs: Min guard;Minimal assistance;Rolling walker General ADL Comments: Min assist to rise into standing with min guard once ambulating.      Vision Baseline Vision/History: No visual deficits Vision Assessment?: No apparent visual deficits     Perception     Praxis      Pertinent Vitals/Pain Pain Assessment: No/denies pain     Hand Dominance Right   Extremity/Trunk Assessment Upper Extremity Assessment Upper Extremity Assessment: Generalized weakness   Lower Extremity Assessment Lower Extremity Assessment: Generalized weakness   Cervical / Trunk Assessment Cervical / Trunk Assessment: Kyphotic   Communication Communication Communication: HOH   Cognition Arousal/Alertness: Awake/alert Behavior During Therapy:  WFL for tasks assessed/performed Overall Cognitive Status: Within Functional Limits for tasks assessed                                     General Comments  Son  present during session.     Exercises     Shoulder Instructions      Home Living Family/patient expects to be discharged to:: Private residence Living Arrangements: Spouse/significant other;Children Available Help at Discharge: Family;Available 24 hours/day Type of Home: House Home Access: Stairs to enter [employer] of Steps: 1 Entrance Stairs-Rails: None Home Layout: One level     Bathroom Shower/Tub: Occupational psychologist: Standard     Home Equipment: Environmental consultant - 4 wheels;Shower seat          Prior Functioning/Environment Level of Independence: Independent with assistive device(s)  Gait / Transfers Assistance Needed: Has been limited the past few days secondary to increased dyspnea. Amb with intermittent use of rollator              OT Problem List: Decreased strength;Decreased activity tolerance;Impaired balance (sitting and/or standing);Decreased safety awareness;Decreased knowledge of use of DME or AE;Decreased knowledge of precautions;Cardiopulmonary status limiting activity      OT Treatment/Interventions: Self-care/ADL training;Therapeutic exercise;Energy conservation;DME and/or AE instruction;Therapeutic activities;Patient/family education;Balance training    OT Goals(Current goals can be found in the care plan section) Acute Rehab OT Goals Patient Stated Goal: Better breathing and home OT Goal Formulation: With patient Time For Goal Achievement: 08/03/17 Potential to Achieve Goals: Good ADL Goals Pt Will Perform Grooming: with modified independence;standing Pt Will Perform Upper Body Dressing: with modified independence;sitting Pt Will Perform Lower Body Dressing: with modified independence;sit to/from stand Pt Will Transfer to Toilet: with modified independence;ambulating;regular height toilet Pt Will Perform Toileting - Clothing Manipulation and hygiene: with modified independence;sit to/from stand Pt/caregiver will Perform Home  Exercise Program: Increased strength;Both right and left upper extremity;With written HEP provided;With Supervision  OT Frequency: Min 2X/week   Barriers to D/C:            Co-evaluation              AM-PAC PT "6 Clicks" Daily Activity     Outcome Measure Help from another person eating meals?: A Little Help from another person taking care of personal grooming?: A Little Help from another person toileting, which includes using toliet, bedpan, or urinal?: A Little Help from another person bathing (including washing, rinsing, drying)?: A Little Help from another person to put on and taking off regular upper body clothing?: A Little Help from another person to put on and taking off regular lower body clothing?: A Little 6 Click Score: 18   End of Session Equipment Utilized During Treatment: Rolling walker;Gait belt;Oxygen(3L O2) Nurse Communication: Mobility status(cleared for activity)  Activity Tolerance: Patient tolerated treatment well Patient left: (in bathroom finishing session with PT )  OT Visit Diagnosis: Unsteadiness on feet (R26.81);Other abnormalities of gait and mobility (R26.89);Muscle weakness (generalized) (M62.81)                Time: 4270-6237 OT Time Calculation (min): 22 min Charges:  OT General Charges $OT Visit: 1 Visit OT Evaluation $OT Eval Moderate Complexity: 1 Mod G-Codes:     Norman Herrlich, MS OTR/L  Pager: [place] [facility] [place] 07/20/2017, 4:08 PM

## 2017-07-20 NOTE — Discharge Instructions (Addendum)
Information on my medicine - ELIQUIS® (apixaban) ° °This medication education was reviewed with me or my healthcare representative as part of my discharge preparation.  The pharmacist that spoke with me during my hospital stay was:  Wilson, Frank Rhea, RPH ° °Why was Eliquis® prescribed for you? °Eliquis® was prescribed for you to reduce the risk of a blood clot forming that can cause a stroke if you have a medical condition called atrial fibrillation (a type of irregular heartbeat). ° °What do You need to know about Eliquis® ? °Take your Eliquis® TWICE DAILY - one tablet in the morning and one tablet in the evening with or without food. If you have difficulty swallowing the tablet whole please discuss with your pharmacist how to take the medication safely. ° °Take Eliquis® exactly as prescribed by your doctor and DO NOT stop taking Eliquis® without talking to the doctor who prescribed the medication.  Stopping may increase your risk of developing a stroke.  Refill your prescription before you run out. ° °After discharge, you should have regular check-up appointments with your healthcare provider that is prescribing your Eliquis®.  In the future your dose may need to be changed if your kidney function or weight changes by a significant amount or as you get older. ° °What do you do if you miss a dose? °If you miss a dose, take it as soon as you remember on the same day and resume taking twice daily.  Do not take more than one dose of ELIQUIS at the same time to make up a missed dose. ° °Important Safety Information °A possible side effect of Eliquis® is bleeding. You should call your healthcare provider right away if you experience any of the following: °  Bleeding from an injury or your nose that does not stop. °  Unusual colored urine (red or dark brown) or unusual colored stools (red or black). °  Unusual bruising for unknown reasons. °  A serious fall or if you hit your head (even if there is no  bleeding). ° °Some medicines may interact with Eliquis® and might increase your risk of bleeding or clotting while on Eliquis®. To help avoid this, consult your healthcare provider or pharmacist prior to using any new prescription or non-prescription medications, including herbals, vitamins, non-steroidal anti-inflammatory drugs (NSAIDs) and supplements. ° °This website has more information on Eliquis® (apixaban): http://www.eliquis.com/eliquis/home ° °

## 2017-07-20 NOTE — Progress Notes (Signed)
Discussing with patient why BS would be high. Talked about cons carb diet.Patient requesting graham crackers, jello, due to tray being late.  Son interrupted nicely and stated that he does not feel that it should be a problem to give him graham crackers as the food they are sending is ddefinitlylnot low carb. I explained that it is a concentrated carb diet, gave patient graham crackers per request.  Meal finally came a hour later. Also discussed fluid restriction.

## 2017-07-20 NOTE — Progress Notes (Signed)
Report called to 4E.

## 2017-07-20 NOTE — Progress Notes (Signed)
Inpatient Diabetes Program Recommendations  AACE/ADA: New Consensus Statement on Inpatient Glycemic Control (2015)  Target Ranges:  Prepandial:   less than 140 mg/dL      Peak postprandial:   less than 180 mg/dL (1-2 hours)      Critically ill patients:  140 - 180 mg/dL   Results for RODELL, MARRS (MRN [id number]) as of 07/20/2017 10:41  Ref. Range 07/19/2017 08:02 07/19/2017 11:13 07/19/2017 16:34 07/19/2017 21:21 07/20/2017 05:36 07/20/2017 08:05  Glucose-Capillary Latest Ref Range: 65 - 99 mg/dL 129 (H) 249 (H) 202 (H) 248 (H) 208 (H) 220 (H)   Review of Glycemic Control  Diabetes history: DM2  Outpatient Diabetes medications: Lantus 45 units QAM, Lantus 60 units QPM, Novolog 30 units TID with meals, Glipizide 5 mg BID, Metformin 1000 mg BID Current orders for Inpatient glycemic control: Lantus 25 units QAM, Lantus 30 units QHS, Novolog 0-9 units TID with meals, Novolog 0-5 units QHS  Inpatient Diabetes Program Recommendations: Insulin - Meal Coverage: Please consider ordering Novolog 3 units TID with meals for meal coverage if patient eats at least 50% of meals.  NOTE: Added Carb Mod to Renal diet. Recommend ordering Novolog meal coverage.  Thanks, Barnie Alderman, RN, MSN, CDE Diabetes Coordinator Inpatient Diabetes Program [phone] (Team Pager from 8am to 5pm)

## 2017-07-20 NOTE — Progress Notes (Signed)
Progress Note  Patient Name: Jose [name] Date of Encounter: 07/20/2017  Primary Cardiologist: Harl Bowie  Subjective   Patient states he feels "yucky" this morning. He denies chest pain, shortness of breath, palpitations, abd pain, fevers, numbness, tingling, focal weakness. Last night was the first night he has not worn CPAP - he has trouble with mask leaking.   Patient's wife asks that on discharge, his medicines be sent to [place], [place] (phone # [phone] ex [phone]) and ask they be expedited so they will be over-nighted to patient.   Inpatient Medications    Scheduled Meds: . amiodarone  200 mg Oral BID  . apixaban  5 mg Oral BID  . aspirin EC  81 mg Oral Daily  . atorvastatin  40 mg Oral q1800  . chlorhexidine  15 mL Mouth Rinse BID  . clopidogrel  75 mg Oral Daily  . ferrous sulfate  325 mg Oral TID WC  . furosemide  80 mg Intravenous BID  . Influenza vac split quadrivalent PF  0.5 mL Intramuscular Tomorrow-1000  . insulin aspart  0-5 Units Subcutaneous QHS  . insulin aspart  0-9 Units Subcutaneous TID WC  . insulin glargine  25 Units Subcutaneous Daily  . insulin glargine  30 Units Subcutaneous QHS  . isosorbide mononitrate  30 mg Oral Daily  . magnesium oxide  400 mg Oral Daily  . mouth rinse  15 mL Mouth Rinse q12n4p  . metoprolol tartrate  25 mg Oral BID  . omega-3 acid ethyl esters  2 g Oral Q breakfast  . omega-3 acid ethyl esters  3 g Oral BID AC  . polyethylene glycol  17 g Oral BID  . sertraline  100 mg Oral Daily  . sodium chloride flush  3 mL Intravenous Q12H  . tiotropium  18 mcg Inhalation QHS   Continuous Infusions: . sodium chloride     PRN Meds: sodium chloride, acetaminophen, albuterol, morphine injection, nitroGLYCERIN, ondansetron (ZOFRAN) IV, oxyCODONE-acetaminophen, sodium chloride flush   Vital Signs    Vitals:   07/20/17 0500 07/20/17 0530 07/20/17 0600 07/20/17 0630  BP:    (!) 142/62  Pulse: 61  62 (!) 56  Resp: 13  17 18     Temp:      TempSrc:      SpO2: 92%  91% 98%  Weight:  297 lb 6.4 oz (134.9 kg)    Height:        Intake/Output Summary (Last 24 hours) at 07/20/2017 0807 Last data filed at 07/20/2017 0500 Gross per 24 hour  Intake 770 ml  Output 4010 ml  Net -3240 ml   Filed Weights   07/18/17 0340 07/19/17 0500 07/20/17 0530  Weight: (!) 302 lb (137 kg) (!) 300 lb 11.3 oz (136.4 kg) 297 lb 6.4 oz (134.9 kg)    Telemetry    Sinus rhythm with occasional PVCs; has been more consistently in low 60s and high 50's in terms of HR - Personally Reviewed 07/19/17  ECG    N/a - Personally Reviewed  Physical Exam   GEN: No acute distress, lying in bed.   Neck: hard to evaluate JVD due to obesity Cardiac: RRR, no murmurs, rubs, or gallops.  Respiratory: Clear to auscultation bilaterally, but very diminished breath sounds bilaterally GI: Soft, nontender; distended and tight  MS: 2+ pitting edema to sacrum Neuro: CN 2-12 intact, strength, sensation intact throughout, able to follow commands and interact appropriately  Psych: Normal affect   Labs  Chemistry Recent Labs  Lab 07/18/17 0225 07/19/17 0221 07/20/17 0602  NA 126* 129* 131*  K 4.0 3.8 4.7  CL 89* 89* 89*  CO2 26 29 31   GLUCOSE 117* 129* 215*  BUN 46* 46* 37*  CREATININE 1.94* 1.76* 1.55*  CALCIUM 7.9* 8.5* 8.8*  GFRNONAA 34* 39* 45*  GFRAA 40* 45* 52*  ANIONGAP 11 11 11      Hematology Recent Labs  Lab 07/17/17 0248 07/18/17 0225 07/19/17 0221  WBC 10.7* 9.0 8.4  RBC 3.83* 3.48* 3.77*  HGB 10.5* 9.7* 10.7*  HCT 33.0* 29.7* 32.7*  MCV 86.2 85.3 86.7  MCH 27.4 27.9 28.4  MCHC 31.8 32.7 32.7  RDW 15.1 14.7 15.0  PLT 227 236 276    Cardiac Enzymes Recent Labs  Lab 07/14/17 1559 07/14/17 1929 07/15/17 0235 07/15/17 1036  TROPONINI 16.59* 17.03* 22.73* 18.41*    Recent Labs  Lab 07/14/17 0931  TROPIPOC 18.10*     BNP Recent Labs  Lab 07/15/17 0235  BNP 258.5*     DDimer No results for  input(s): DDIMER in the last 168 hours.   Radiology    No results found.  Cardiac Studies    Cardiac cath July 14, 2017  -unsuccessful attempted PTCA of occluded distal LAD, chronic occlusion of RCA Diagnostic Diagram       Post-Intervention Diagram        Echo July 14, 2017 - Left ventricle: LVEF is approximately 40% with akinesis of the distal inferoseptal wall and apex. Aneurysmal dilatation of apex. Consider limited echo with contrast to evaluate for thrombus. The cavity size was normal. Wall thickness was increased in a pattern of mild LVH. - Mitral valve: Calcified annulus. Mildly thickened leaflets .  Patient Profile     66 y.o. male presenting with extensive anteroapical and inferior infarction, unsuccessful attempt at revascularization of occluded distal LAD, chronic occlusion of RCA.   Moderate reduction in ventricular systolic function.   Atrial fibrillation with rapid ventricular response present on arrival, resolved with amiodarone.   Moderate acute renal failure related to contrast nephrotoxicity/retention, improving.    Assessment & Plan    CAD s/p STEMI:  Unsuccessful PCI to distal LAD occlusion; echo consistent with aneurysmal dilation of apex.  --asa 81mg  daily x 1 month, plavix 75mg  daily, atorvastatin 40mg  daily, lopresor 25mg  BID --may need to decrease lopressor to 12.5mg  BID due to bradycardia --Eliquis for LV dilation  Acute on chronic systolic CHF:  Net neg ~9X yesterday, down 3lbs in weight with improvement of renal function. --continue IV lasix 80mg  BID --holding on starting ACE-I until renal function stabilizes  AFib:  Maintaining sinus rhythm. Plan current dose of amiodarone 4 weeks (200mg  BID), then decrease to maintenance dose of 200 mg daily CHADSVasc 5 (age, hypertension, diabetes, heart failure, CAD). --amiodarone 200mg  BID --Eliquis   Acute renal failure on CKD stage III:   Continues to improve with Cr 1.55  today. Etiology likely combo of contrast nephrotoxicity and urinary retention (now resolved, no foley needed).   OSA: Fatigue likely related to not wearing CPAP overnight; he has no signs or symptoms of infection or stroke. Encouraged patient to wear CPAP tonight.  DM:  No further hypoglycemia. IM assisting in DM management.  COPD/Smoking:  Strongly recommend permanent smoking cessation.  Hyponatremia: Improving to 131 today. --fluid restriction, diuresis  Dispo: HH face to face orders placed; on d/c need meds called in to [place], [place] (phone # [phone] ex [phone]) and ask they be  expedited so they will be over-nighted to patient.  For questions or updates, please contact [place] Please consult [email or web address] for contact info under Cardiology/STEMI.   Signed, Alphonzo Grieve, MD  07/20/2017, 8:07 AM    I have personally seen and examined this patient with Dr. Jari Favre. I agree with the assessment and plan as outlined above. He is stable today. BP is slightly elevated.  He has severe three vessel CAD. CABG will be considered once his fluid status is optimized.  LVEF is 40%. Will change Lopressor to Toprol today and lower overall dose given bradycardia. Will start hydralazine today. Will need to start ARB or Ace-inh once renal function back to baseline He is still volume overloaded. Continue IV Lasix today.  Transfer to telemetry unit when bed available.   Lauree Chandler 07/20/2017 11:45 AM

## 2017-07-21 DIAGNOSIS — K5900 Constipation, unspecified: Secondary | ICD-10-CM

## 2017-07-21 LAB — GLUCOSE, CAPILLARY
GLUCOSE-CAPILLARY: 210 mg/dL — AB (ref 65–99)
GLUCOSE-CAPILLARY: 362 mg/dL — AB (ref 65–99)
Glucose-Capillary: 331 mg/dL — ABNORMAL HIGH (ref 65–99)
Glucose-Capillary: 275 mg/dL — ABNORMAL HIGH (ref 65–99)

## 2017-07-21 LAB — BASIC METABOLIC PANEL
Anion gap: 10 (ref 5–15)
BUN: 33 mg/dL — AB (ref 6–20)
CHLORIDE: 91 mmol/L — AB (ref 101–111)
CO2: 31 mmol/L (ref 22–32)
CREATININE: 1.44 mg/dL — AB (ref 0.61–1.24)
Calcium: 8.8 mg/dL — ABNORMAL LOW (ref 8.9–10.3)
GFR calc Af Amer: 57 mL/min — ABNORMAL LOW (ref >60)
GFR calc non Af Amer: 49 mL/min — ABNORMAL LOW (ref >60)
Glucose, Bld: 197 mg/dL — ABNORMAL HIGH (ref 65–99)
POTASSIUM: 4.3 mmol/L (ref 3.5–5.1)
Sodium: 132 mmol/L — ABNORMAL LOW (ref 135–145)

## 2017-07-21 MED ORDER — METOLAZONE 5 MG PO TABS
10.0000 mg | ORAL_TABLET | Freq: Every day | ORAL | Status: DC
  Administered 2017-07-21: 10 mg via ORAL
  Filled 2017-07-21: qty 2

## 2017-07-21 MED ORDER — INSULIN GLARGINE 100 UNIT/ML ~~LOC~~ SOLN
30.0000 [IU] | Freq: Two times a day (BID) | SUBCUTANEOUS | Status: DC
  Administered 2017-07-21 – 2017-07-26 (×10): 30 [IU] via SUBCUTANEOUS
  Filled 2017-07-21 (×11): qty 0.3

## 2017-07-21 MED ORDER — FLEET ENEMA 7-19 GM/118ML RE ENEM
1.0000 | ENEMA | Freq: Once | RECTAL | Status: AC
  Administered 2017-07-21: 1 via RECTAL
  Filled 2017-07-21: qty 1

## 2017-07-21 NOTE — Progress Notes (Signed)
PROGRESS NOTE    Jose [name]  MVH:[id number] DOB: Jul 04, 1951 DOA: 07/14/2017 PCP: Cleophas Dunker, MD    Brief Narrative:  66 year old male who presents with chest pain and dyspnea. He is known to have coronary artery disease, COPD, dyslipidemia, type 2 diabetes mellitus,hypertension, obesity and tobacco abuse. He had chest pain intermittently for last 3 days prior to hospitalization, associated with dyspnea on exertion. Due to his persistent symptoms he was brought to the hospital, he was found in atrial fibrillation and his electrocardiogram showed ST elevations in the inferior lateral leads, his troponin was 20, and he was immediately taken to the cardiac catheterization lab. On his initial physical examination blood pressure 115/57, heart rate 52, temperature 98.9, oxygen saturation 93% his lungs were clear to auscultation bilaterally heart S1-S2 present, irregularly irregular no S3 or S4 gallop, no murmurs, his abdomen was soft nontender, 1+ lower extremity edema  Cardiac catheterization he was found to have total occlusion of the mid and apical LAD, he underwent  Unsuccessful PCI to the LAD. He also was found to have chronic occlusion of the RCA, ejection fraction is 40%.   He developed acute kidney injury. He was placed on amiodarone and started on aggressive medical therapy for his coronary artery disease. Likely not a good surgical candidate.    Assessment & Plan:   Principal Problem:   STEMI (ST elevation myocardial infarction) ([facility]) Active Problems:   COPD (chronic obstructive pulmonary disease) (HCC)   HTN (hypertension)   Diabetes (HCC)   Morbid obesity (HCC)   OSA (obstructive sleep apnea)   Chronic diastolic CHF (congestive heart failure) (HCC)   Atrial fibrillation with rapid ventricular response ([place])   STEMI involving left anterior descending coronary artery ([place])   1. Coronary disease status post ST elevation myocardial infarction. On aggressive medical therapy  with dual antiplatelet therapy with asa- clopidogrel, plus isosorbide, blood pressure control with hydralazine and metoprolol, statin with atorvastatin. Patient continue to be chest pain free.   2. Atrial fibrillation, new onset. Continue rate controlled, amiodarone and metoprolol, plus anticoagulation with apixaban.   3. Systolic heart failure, acute on chronic. Patient edematous, documented only 500 cc per urine output over last 24 hours, will add daily weight and will continue strict in and out, will continue high dose of furosemide 80 mg bid and will add metolazone 10 mg daily, to continue target negative fluids balance.  4. Acute kidney injury chronic kidney disease stage III. Stable renal function with serum cr at 1,44, K at 4,3 and serum bicarbonate at 31, will continue diuresis and follow renal panel in am.  5. Type 2 diabetes mellitus. Capillary glucose 255, 274, 219, 210, 331. Will increase basal insulin at 30 units bid and continue close monitoring and coverage with insulin sliding scale.  6. COPD. Continue to be stable with no signs of exacerbation, on as needed albuterol plus daily tiotropium.   DVT prophylaxis: apixaban  Code Status: full Family Communication: I spoke with patient's family at the bedside and all questions were addressed. Disposition Plan: Home   Consultants:     Procedures:   Cardiac catheterization   Antimicrobials:      Subjective: Persistent edema of the lower extremities with only minimal improvement, no dyspnea, chest pain or palpitations. Positive constipation. No nausea or vomiting.   Objective: Vitals:   07/20/17 2013 07/20/17 2225 07/20/17 2229 07/21/17 0556  BP: (!) 162/59   (!) 146/56  Pulse: 73 75 75 66  Resp: 16 18 18  10  Temp: (!) 97 F (36.1 C)   (!) 97.3 F (36.3 C)  TempSrc: Oral   Axillary  SpO2: 97% 95% 95% 95%  Weight:      Height:        Intake/Output Summary (Last 24 hours) at 07/21/2017 0830 Last data  filed at 07/20/2017 1200 Gross per 24 hour  Intake 600 ml  Output 550 ml  Net 50 ml   Filed Weights   07/18/17 0340 07/19/17 0500 07/20/17 0530  Weight: (!) 137 kg (302 lb) (!) 136.4 kg (300 lb 11.3 oz) 134.9 kg (297 lb 6.4 oz)    Examination:   General: Not in pain or dyspnea Neurology: Awake and alert, non focal  E ENT: no pallor, no icterus, oral mucosa moist Cardiovascular: No JVD. S1-S2 present, no gallops, rubs, or murmurs. +++/++++ pitting extremity edema. Pulmonary: vesicular breath sounds bilaterally, adequate air movement, no wheezing, rhonchi or rales. Gastrointestinal. Abdomen protuberant, no organomegaly, non tender, no rebound or guarding Skin. No rashes Musculoskeletal: no joint deformities     Data Reviewed: I have personally reviewed following labs and imaging studies  CBC: Recent Labs  Lab 07/16/17 0233 07/17/17 0248 07/18/17 0225 07/19/17 0221 07/20/17 0602  WBC 14.6* 10.7* 9.0 8.4 10.0  NEUTROABS  --   --   --   --  7.8*  HGB 10.2* 10.5* 9.7* 10.7* 11.1*  HCT 32.0* 33.0* 29.7* 32.7* 35.3*  MCV 86.7 86.2 85.3 86.7 88.9  PLT 213 227 236 276 161   Basic Metabolic Panel: Recent Labs  Lab 07/17/17 0248 07/18/17 0225 07/19/17 0221 07/20/17 0602 07/21/17 0224  NA 126* 126* 129* 131* 132*  K 4.0 4.0 3.8 4.7 4.3  CL 87* 89* 89* 89* 91*  CO2 26 26 29 31 31   GLUCOSE 115* 117* 129* 215* 197*  BUN 50* 46* 46* 37* 33*  CREATININE 2.29* 1.94* 1.76* 1.55* 1.44*  CALCIUM 7.9* 7.9* 8.5* 8.8* 8.8*   GFR: Estimated Creatinine Clearance: 72.7 mL/min (A) (by C-G formula based on SCr of 1.44 mg/dL (H)). Liver Function Tests: No results for input(s): AST, ALT, ALKPHOS, BILITOT, PROT, ALBUMIN in the last 168 hours. No results for input(s): LIPASE, AMYLASE in the last 168 hours. No results for input(s): AMMONIA in the last 168 hours. Coagulation Profile: No results for input(s): INR, PROTIME in the last 168 hours. Cardiac Enzymes: Recent Labs  Lab  07/14/17 0922 07/14/17 1559 07/14/17 1929 07/15/17 0235 07/15/17 1036  TROPONINI 20.79* 16.59* 17.03* 22.73* 18.41*   BNP (last 3 results) No results for input(s): PROBNP in the last 8760 hours. HbA1C: No results for input(s): HGBA1C in the last 72 hours. CBG: Recent Labs  Lab 07/20/17 0805 07/20/17 1301 07/20/17 1656 07/20/17 2110 07/21/17 0601  GLUCAP 220* 255* 274* 219* 210*   Lipid Profile: No results for input(s): CHOL, HDL, LDLCALC, TRIG, CHOLHDL, LDLDIRECT in the last 72 hours. Thyroid Function Tests: No results for input(s): TSH, T4TOTAL, FREET4, T3FREE, THYROIDAB in the last 72 hours. Anemia Panel: No results for input(s): VITAMINB12, FOLATE, FERRITIN, TIBC, IRON, RETICCTPCT in the last 72 hours.    Radiology Studies: I have reviewed all of the imaging during this hospital visit personally     Scheduled Meds: . amiodarone  200 mg Oral BID  . apixaban  5 mg Oral BID  . aspirin EC  81 mg Oral Daily  . atorvastatin  40 mg Oral q1800  . chlorhexidine  15 mL Mouth Rinse BID  . clopidogrel  75 mg Oral  Daily  . ferrous sulfate  325 mg Oral TID WC  . furosemide  80 mg Intravenous BID  . hydrALAZINE  25 mg Oral Q8H  . Influenza vac split quadrivalent PF  0.5 mL Intramuscular Tomorrow-1000  . insulin aspart  0-5 Units Subcutaneous QHS  . insulin aspart  0-9 Units Subcutaneous TID WC  . insulin glargine  25 Units Subcutaneous Daily  . insulin glargine  30 Units Subcutaneous QHS  . isosorbide mononitrate  30 mg Oral Daily  . magnesium oxide  400 mg Oral Daily  . mouth rinse  15 mL Mouth Rinse q12n4p  . metoprolol succinate  25 mg Oral Daily  . omega-3 acid ethyl esters  2 g Oral Q breakfast  . omega-3 acid ethyl esters  3 g Oral BID AC  . polyethylene glycol  17 g Oral BID  . senna-docusate  2 tablet Oral BID  . sertraline  100 mg Oral Daily  . tiotropium  18 mcg Inhalation QHS   Continuous Infusions:   LOS: 7 days        Wali Reinheimer Gerome Apley,  MD [facility] Pager [phone]

## 2017-07-21 NOTE — Progress Notes (Signed)
Progress Note  Patient Name: Jose [name] Date of Encounter: 07/21/2017  Primary Cardiologist: Dr. Carlyle Dolly  Subjective   Lying comfortably in bed.  No chest pain.  Has CPAP on.  Only complaint is that of severe constipation.  Inpatient Medications    Scheduled Meds: . amiodarone  200 mg Oral BID  . apixaban  5 mg Oral BID  . aspirin EC  81 mg Oral Daily  . atorvastatin  40 mg Oral q1800  . chlorhexidine  15 mL Mouth Rinse BID  . clopidogrel  75 mg Oral Daily  . ferrous sulfate  325 mg Oral TID WC  . furosemide  80 mg Intravenous BID  . hydrALAZINE  25 mg Oral Q8H  . Influenza vac split quadrivalent PF  0.5 mL Intramuscular Tomorrow-1000  . insulin aspart  0-5 Units Subcutaneous QHS  . insulin aspart  0-9 Units Subcutaneous TID WC  . insulin glargine  25 Units Subcutaneous Daily  . insulin glargine  30 Units Subcutaneous QHS  . isosorbide mononitrate  30 mg Oral Daily  . magnesium oxide  400 mg Oral Daily  . mouth rinse  15 mL Mouth Rinse q12n4p  . metoprolol succinate  25 mg Oral Daily  . omega-3 acid ethyl esters  2 g Oral Q breakfast  . omega-3 acid ethyl esters  3 g Oral BID AC  . polyethylene glycol  17 g Oral BID  . senna-docusate  2 tablet Oral BID  . sertraline  100 mg Oral Daily  . tiotropium  18 mcg Inhalation QHS   Continuous Infusions:  PRN Meds: acetaminophen, albuterol, morphine injection, nitroGLYCERIN, ondansetron (ZOFRAN) IV, oxyCODONE-acetaminophen   Vital Signs    Vitals:   07/20/17 2013 07/20/17 2225 07/20/17 2229 07/21/17 0556  BP: (!) 162/59   (!) 146/56  Pulse: 73 75 75 66  Resp: 16 18 18 10   Temp: (!) 97 F (36.1 C)   (!) 97.3 F (36.3 C)  TempSrc: Oral   Axillary  SpO2: 97% 95% 95% 95%  Weight:      Height:        Intake/Output Summary (Last 24 hours) at 07/21/2017 0817 Last data filed at 07/20/2017 1200 Gross per 24 hour  Intake 600 ml  Output 550 ml  Net 50 ml   Filed Weights   07/18/17 0340 07/19/17 0500  07/20/17 0530  Weight: (!) 302 lb (137 kg) (!) 300 lb 11.3 oz (136.4 kg) 297 lb 6.4 oz (134.9 kg)    Telemetry    Sinus rhythm with occasional PVCs.- Personally Reviewed  ECG    No new data- Personally Reviewed  Physical Exam  Morbidly obese, wearing CPAP GEN: No acute distress.   Neck: No JVD Cardiac: RRR, no murmurs, rubs, or gallops.  Significant bilateral lower extremity edema Respiratory: Clear to auscultation bilaterally. GI: Soft, nontender, non-distended  MS:  Bilateral lower extremity edema; No deformity. Neuro:  Nonfocal  Psych: Normal affect   Labs    Chemistry Recent Labs  Lab 07/19/17 0221 07/20/17 0602 07/21/17 0224  NA 129* 131* 132*  K 3.8 4.7 4.3  CL 89* 89* 91*  CO2 29 31 31   GLUCOSE 129* 215* 197*  BUN 46* 37* 33*  CREATININE 1.76* 1.55* 1.44*  CALCIUM 8.5* 8.8* 8.8*  GFRNONAA 39* 45* 49*  GFRAA 45* 52* 57*  ANIONGAP 11 11 10      Hematology Recent Labs  Lab 07/18/17 0225 07/19/17 0221 07/20/17 0602  WBC 9.0 8.4 10.0  RBC  3.48* 3.77* 3.97*  HGB 9.7* 10.7* 11.1*  HCT 29.7* 32.7* 35.3*  MCV 85.3 86.7 88.9  MCH 27.9 28.4 28.0  MCHC 32.7 32.7 31.4  RDW 14.7 15.0 14.8  PLT 236 276 283    Cardiac Enzymes Recent Labs  Lab 07/14/17 1559 07/14/17 1929 07/15/17 0235 07/15/17 1036  TROPONINI 16.59* 17.03* 22.73* 18.41*    Recent Labs  Lab 07/14/17 0931  TROPIPOC 18.10*     BNP Recent Labs  Lab 07/15/17 0235  BNP 258.5*     DDimer No results for input(s): DDIMER in the last 168 hours.   Radiology    No results found.  Cardiac Studies   No new data  Patient Profile     66 y.o. male presenting with extensive anteroapical and inferior infarction, unsuccessful attempt at revascularization of occluded distal LAD, chronic occlusion of RCA.  Moderate reduction inventricular systolic function. Atrial fibrillation with rapid ventricular response present on arrival, resolved with amiodarone. Moderate acute renal  failure related to contrast nephrotoxicity/retention, improving.    Assessment & Plan    1.  CAD with presenting ST elevation myocardial infarction: Not a good surgical candidate.  Plan is medical therapy.  No recurrence of chest pain. 2.  Atrial fibrillation on presentation now maintaining sinus rhythm on amiodarone. 3.  Obstructive sleep apnea: Continue compliance with CPAP. 4.  Morbid obesity and significant deconditioning: Physical therapy to improve mobility. 5.  Severe constipation: He may need GI consultation/enema/internal medicine to help with this.  Already on MiraLAX and milk of magnesia. 6.  Chronic combined systolic and diastolic heart failure, still diuresing, sodium is stable, BNP has come down.  May need to switch Lasix to p.o. in the next day or 2.  Still has significant lower extremity edema.  For questions or updates, please contact [place] Please consult [email or web address] for contact info under Cardiology/STEMI.      Signed, Sinclair Grooms, MD  07/21/2017, 8:17 AM

## 2017-07-22 DIAGNOSIS — I5023 Acute on chronic systolic (congestive) heart failure: Secondary | ICD-10-CM

## 2017-07-22 DIAGNOSIS — I25110 Atherosclerotic heart disease of native coronary artery with unstable angina pectoris: Secondary | ICD-10-CM

## 2017-07-22 DIAGNOSIS — I480 Paroxysmal atrial fibrillation: Secondary | ICD-10-CM

## 2017-07-22 LAB — GLUCOSE, CAPILLARY
GLUCOSE-CAPILLARY: 293 mg/dL — AB (ref 65–99)
Glucose-Capillary: 314 mg/dL — ABNORMAL HIGH (ref 65–99)
Glucose-Capillary: 367 mg/dL — ABNORMAL HIGH (ref 65–99)
Glucose-Capillary: 255 mg/dL — ABNORMAL HIGH (ref 65–99)

## 2017-07-22 LAB — BASIC METABOLIC PANEL
Anion gap: 11 (ref 5–15)
BUN: 37 mg/dL — AB (ref 6–20)
CO2: 32 mmol/L (ref 22–32)
Calcium: 8.9 mg/dL (ref 8.9–10.3)
Chloride: 88 mmol/L — ABNORMAL LOW (ref 101–111)
Creatinine, Ser: 1.76 mg/dL — ABNORMAL HIGH (ref 0.61–1.24)
GFR calc Af Amer: 45 mL/min — ABNORMAL LOW (ref >60)
GFR, EST NON AFRICAN AMERICAN: 39 mL/min — AB (ref >60)
GLUCOSE: 347 mg/dL — AB (ref 65–99)
POTASSIUM: 4.5 mmol/L (ref 3.5–5.1)
Sodium: 131 mmol/L — ABNORMAL LOW (ref 135–145)

## 2017-07-22 MED ORDER — FERROUS SULFATE 325 (65 FE) MG PO TABS
325.0000 mg | ORAL_TABLET | Freq: Every day | ORAL | Status: DC
  Administered 2017-07-23 – 2017-08-09 (×18): 325 mg via ORAL
  Filled 2017-07-22 (×18): qty 1

## 2017-07-22 MED ORDER — METOLAZONE 5 MG PO TABS
10.0000 mg | ORAL_TABLET | Freq: Every day | ORAL | Status: DC
  Administered 2017-07-22: 10 mg via ORAL
  Filled 2017-07-22: qty 2

## 2017-07-22 MED ORDER — HYDRALAZINE HCL 50 MG PO TABS
50.0000 mg | ORAL_TABLET | Freq: Three times a day (TID) | ORAL | Status: DC
  Administered 2017-07-22 – 2017-08-02 (×27): 50 mg via ORAL
  Filled 2017-07-22 (×26): qty 1

## 2017-07-22 NOTE — Progress Notes (Signed)
PT Cancellation Note  Patient Details Name: Jose [name] MRN: [id number] DOB: 04/18/1951   Cancelled Treatment:    Reason Eval/Treat Not Completed: Other (comment). Pt reports fatigue after amb with cardiac rehab.   [place] 07/22/2017, 4:06 PM [place]

## 2017-07-22 NOTE — Progress Notes (Signed)
Progress Note  Patient Name: Jose [name] Date of Encounter: 07/22/2017  Primary Cardiologist: Harl Bowie  Subjective   Patient states he is feeling well. He denies chest, pain, shortness of breath, palpitations. He is walking to the restroom without much problems (with walker). He endorses continued high urine output.  Inpatient Medications    Scheduled Meds: . amiodarone  200 mg Oral BID  . apixaban  5 mg Oral BID  . aspirin EC  81 mg Oral Daily  . atorvastatin  40 mg Oral q1800  . chlorhexidine  15 mL Mouth Rinse BID  . clopidogrel  75 mg Oral Daily  . ferrous sulfate  325 mg Oral TID WC  . furosemide  80 mg Intravenous BID  . hydrALAZINE  25 mg Oral Q8H  . Influenza vac split quadrivalent PF  0.5 mL Intramuscular Tomorrow-1000  . insulin aspart  0-5 Units Subcutaneous QHS  . insulin aspart  0-9 Units Subcutaneous TID WC  . insulin glargine  30 Units Subcutaneous BID  . isosorbide mononitrate  30 mg Oral Daily  . magnesium oxide  400 mg Oral Daily  . mouth rinse  15 mL Mouth Rinse q12n4p  . metolazone  10 mg Oral Daily  . metoprolol succinate  25 mg Oral Daily  . omega-3 acid ethyl esters  2 g Oral Q breakfast  . omega-3 acid ethyl esters  3 g Oral BID AC  . polyethylene glycol  17 g Oral BID  . sertraline  100 mg Oral Daily  . tiotropium  18 mcg Inhalation QHS   Continuous Infusions:  PRN Meds: acetaminophen, albuterol, morphine injection, nitroGLYCERIN, ondansetron (ZOFRAN) IV, oxyCODONE-acetaminophen   Vital Signs    Vitals:   07/21/17 1413 07/21/17 2002 07/21/17 2222 07/22/17 0458  BP: 139/67 (!) 96/41 (!) 157/51 (!) 163/59  Pulse: 75 73  73  Resp: 17 16  16   Temp: 98.5 F (36.9 C) 98 F (36.7 C)  98 F (36.7 C)  TempSrc: Oral Oral  Oral  SpO2: 90% 90%  92%  Weight:    293 lb 1.6 oz (132.9 kg)  Height:        Intake/Output Summary (Last 24 hours) at 07/22/2017 0837 Last data filed at 07/21/2017 1015 Gross per 24 hour  Intake 833 ml  Output -    Net 833 ml   Filed Weights   07/19/17 0500 07/20/17 0530 07/22/17 0458  Weight: (!) 300 lb 11.3 oz (136.4 kg) 297 lb 6.4 oz (134.9 kg) 293 lb 1.6 oz (132.9 kg)    Telemetry    Sinus rhythm- Personally Reviewed 07/21/17  ECG    N/a - Personally Reviewed  Physical Exam   GEN: No acute distress, lying in bed.   Neck: hard to evaluate JVD due to obesity Cardiac: RRR, no murmurs, rubs, or gallops.  Respiratory: Clear to auscultation bilaterally, but very diminished breath sounds bilaterally GI: Soft, nontender; distended and tight  MS: 2+ pitting edema to sacrum Neuro: CN 2-12 grossly intact  Psych: Normal affect   Labs    Chemistry Recent Labs  Lab 07/20/17 0602 07/21/17 0224 07/22/17 0204  NA 131* 132* 131*  K 4.7 4.3 4.5  CL 89* 91* 88*  CO2 31 31 32  GLUCOSE 215* 197* 347*  BUN 37* 33* 37*  CREATININE 1.55* 1.44* 1.76*  CALCIUM 8.8* 8.8* 8.9  GFRNONAA 45* 49* 39*  GFRAA 52* 57* 45*  ANIONGAP 11 10 11      Hematology Recent Labs  Lab  07/18/17 0225 07/19/17 0221 07/20/17 0602  WBC 9.0 8.4 10.0  RBC 3.48* 3.77* 3.97*  HGB 9.7* 10.7* 11.1*  HCT 29.7* 32.7* 35.3*  MCV 85.3 86.7 88.9  MCH 27.9 28.4 28.0  MCHC 32.7 32.7 31.4  RDW 14.7 15.0 14.8  PLT 236 276 283    Cardiac Enzymes Recent Labs  Lab 07/15/17 1036  TROPONINI 18.41*    No results for input(s): TROPIPOC in the last 168 hours.   BNP No results for input(s): BNP, PROBNP in the last 168 hours.   DDimer No results for input(s): DDIMER in the last 168 hours.   Radiology    No results found.  Cardiac Studies   Cardiac cath July 14, 2017  -unsuccessful attempted PTCA of occluded distal LAD, chronic occlusion of RCA Diagnostic Diagram       Post-Intervention Diagram        Echo July 14, 2017 - Left ventricle: LVEF is approximately 40% with akinesis of the distal inferoseptal wall and apex. Aneurysmal dilatation of apex. Consider limited echo with contrast to  evaluate for thrombus. The cavity size was normal. Wall thickness was increased in a pattern of mild LVH. - Mitral valve: Calcified annulus. Mildly thickened leaflets .  Patient Profile     66 y.o. male presenting with extensive anteroapical and inferior infarction, unsuccessful attempt at revascularization of occluded distal LAD, chronic occlusion of RCA.   Moderate reduction in ventricular systolic function.   Atrial fibrillation with rapid ventricular response present on arrival, resolved with amiodarone.   Moderate acute renal failure related to contrast nephrotoxicity/retention, improving.    Assessment & Plan    CAD s/p STEMI:  Unsuccessful PCI to distal LAD occlusion and not optimal surgical candidate at this time; echo consistent with aneurysmal dilation of apex.  --asa 81mg  daily x 1 month, plavix 75mg  daily, atorvastatin 40mg  daily, lopresor 12.5mg  BID --Eliquis for LV dilation  Acute on chronic systolic CHF:  Ins/outs not accurately recorded; down 11lbs from admission (and lowest recorded weight in our system). --continue IV lasix 80mg  BID --stop metolazone --holding on starting ACE-I until renal function stabilizes  AFib:  Maintaining sinus rhythm. Plan current dose of amiodarone 4 weeks (200mg  BID), then decrease to maintenance dose of 200 mg daily CHADSVasc 5 (age, hypertension, diabetes, heart failure, CAD). --amiodarone 200mg  BID --Eliquis   HTN: Still hypertensive; can increase hydralazine to 50mg  TID. Long term plan is to get him on ace/arb once done diuresing and renal function stabilizes.  Acute renal failure on CKD stage III:   Etiology likely combo of contrast nephrotoxicity and urinary retention (now resolved, no foley needed). Cr bumped up to 1.76 after addition of metolazone, will d/c.  OSA: Encouraged patient to wear CPAP tonight.  DM:  IM assisting in DM management.  COPD/Smoking:  Stable. Strongly recommend permanent smoking  cessation.  Hyponatremia: Improving to 131 today. --fluid restriction, diuresis  Dispo: HH face to face orders placed; on d/c need meds called in to [place], [place] (phone # [phone] ex [phone]) and ask they be expedited so they will be over-nighted to patient.  For questions or updates, please contact [place] Please consult [email or web address] for contact info under Cardiology/STEMI.   Signed, Alphonzo Grieve, MD  07/22/2017, 8:37 AM    I have personally seen and examined this patient with Dr. Jari Favre. I agree with the assessment and plan as outlined above. He is still volume overloaded. Will continue IV Lasix today. I would not resume metolazone given his  renal insufficiency. He is in sinus today. Continue amiodarone and Eliquis. Will continue DAPT with ASA and Plavix for now. The plan right now is for medical management of his CAD but given the RCA CTO and moderate disease in the ostium of the LAD and Circumflex. He is not a great surgical candidate at this time, but he may be considered for surgery once diuresed. He is having no angina currently.   Lauree Chandler 07/22/2017 10:07 AM

## 2017-07-22 NOTE — Progress Notes (Signed)
Jose [name] [facility]  PROGRESS NOTE    Jose [name]  YTK:[id number] DOB: 1951/05/28 DOA: 07/14/2017 PCP: Cleophas Dunker, MD    Brief Narrative:  66 year old male who presents with chest pain and dyspnea. He is known to have coronary artery disease, COPD, dyslipidemia, type 2 diabetes mellitus,hypertension, obesity and tobacco abuse. He had chest pain intermittently for last 3 days prior to hospitalization, associated with dyspnea on exertion. Due to his persistent symptoms he was brought to the hospital, he was found in atrial fibrillation and his electrocardiogram showed ST elevations in the inferior lateral leads, his troponin was 20, and he was immediately taken to the cardiac catheterization lab. On his initial physical examination blood pressure 115/57, heart rate 52, temperature 98.9, oxygen saturation 93% his lungs were clear to auscultation bilaterally heart S1-S2 present, irregularly irregular no S3 or S4 gallop, no murmurs, his abdomen was soft nontender, 1+ lower extremity edema  Cardiac catheterization he was found to have total occlusion of the mid and apical LAD, he underwent Unsuccessful PCI to the LAD. He also was found to have chronic occlusion of the RCA, ejection fraction is 40%.   He developed acute kidney injury. He was placed on amiodarone and started on aggressive medical therapy for his coronary artery disease. Likely not a good surgical candidate.   Assessment & Plan:   Principal Problem:   STEMI (ST elevation myocardial infarction) ([place]) Active Problems:   COPD (chronic obstructive pulmonary disease) (H[facility])   HTN (hypertension)   Diabetes (HCC)   Morbid obesity (HCC)   OSA (obstructive sleep apnea)   Chronic diastolic CHF (congestive heart failure) (HCC)   Atrial fibrillation with rapid ventricular response ([place])   STEMI involving left anterior descending coronary artery ([place])   1. Coronary disease status post ST elevation myocardial infarction. Continue medical therapy  statin, dual antiplatelet therapy with aspirin and clopidogrel, continue  isosorbide, and blood pressure control with hydralazine and metoprolol.   2. Atrial fibrillation, new onset. Rate controlled, with amiodarone and metoprolol, anticoagulation with apixaban.  3. Systolic heart failure, acute on chronic. patient's weight is down 2 kg over the last 24 hours, will continue diuresis with IV furosemide and metolazone, to target negative fluid balance, his symptoms and edema are slowly improving. Continue blood pressure control with hydralazine -isosorbide and metoprolol.  4. Acute kidney injury chronic kidney disease stage III. renal function with serum cr at 1,76 with K at 4,5 will follow on renal panel in am, patient still hypervolemic, will continue aggressive diuresis with loop diuretics and metolazone. Serum bicarbonate at 32.   5. Type 2 diabetes mellitus. Uncontrolled hyperglycemia with capillary glucose 331, 275, 362, 314, 367. Basal insulin at 30 units bid of glargine, will plan to increase to 35 bid in the next 24 hours if persistent hyperglycemia.  6. HTN. Will continue blood pressure control with metoprolol, hydralazine and isosorbide, with blood pressure systolic 557.   6. COPD. No clinical signs of exacerbation, continue albuterol plus tiotropium.  DVT prophylaxis:apixaban Code Status:full Family Communication:I spoke with patient's family at the bedside and all questions were addressed. Disposition Plan:Home   Consultants:    Procedures:  Cardiac catheterization  Antimicrobials:      Subjective: Patient feeling better, improved lower extremity edema, no dyspnea or chest pain, had bowel movement yesterday. No nausea or vomiting.   Objective: Vitals:   07/21/17 1413 07/21/17 2002 07/21/17 2222 07/22/17 0458  BP: 139/67 (!) 96/41 (!) 157/51 (!) 163/59  Pulse: 75 73  73  Resp:  17 16  16   Temp: 98.5 F (36.9 C) 98 F (36.7 C)  98 F (36.7 C)    TempSrc: Oral Oral  Oral  SpO2: 90% 90%  92%  Weight:    132.9 kg (293 lb 1.6 oz)  Height:        Intake/Output Summary (Last 24 hours) at 07/22/2017 0828 Last data filed at 07/21/2017 1015 Gross per 24 hour  Intake 833 ml  Output -  Net 833 ml   Filed Weights   07/19/17 0500 07/20/17 0530 07/22/17 0458  Weight: (!) 136.4 kg (300 lb 11.3 oz) 134.9 kg (297 lb 6.4 oz) 132.9 kg (293 lb 1.6 oz)    Examination:   General: Not in pain or dyspnea, deconditioned Neurology: Awake and alert, non focal  E ENT: no pallor, no icterus, oral mucosa moist Cardiovascular: No JVD. S1-S2 present, rhythmic, no gallops, rubs, or murmurs. +++ piting lower extremity edema. Pulmonary: mild decreased breath sounds bilaterally at bases, adequate air movement, no wheezing, rhonchi or rales. Gastrointestinal. Abdomen protuberant, no organomegaly, non tender, no rebound or guarding Skin. No rashes Musculoskeletal: no joint deformities     Data Reviewed: I have personally reviewed following labs and imaging studies  CBC: Recent Labs  Lab 07/16/17 0233 07/17/17 0248 07/18/17 0225 07/19/17 0221 07/20/17 0602  WBC 14.6* 10.7* 9.0 8.4 10.0  NEUTROABS  --   --   --   --  7.8*  HGB 10.2* 10.5* 9.7* 10.7* 11.1*  HCT 32.0* 33.0* 29.7* 32.7* 35.3*  MCV 86.7 86.2 85.3 86.7 88.9  PLT 213 227 236 276 179   Basic Metabolic Panel: Recent Labs  Lab 07/18/17 0225 07/19/17 0221 07/20/17 0602 07/21/17 0224 07/22/17 0204  NA 126* 129* 131* 132* 131*  K 4.0 3.8 4.7 4.3 4.5  CL 89* 89* 89* 91* 88*  CO2 26 29 31 31  32  GLUCOSE 117* 129* 215* 197* 347*  BUN 46* 46* 37* 33* 37*  CREATININE 1.94* 1.76* 1.55* 1.44* 1.76*  CALCIUM 7.9* 8.5* 8.8* 8.8* 8.9   GFR: Estimated Creatinine Clearance: 59 mL/min (A) (by C-G formula based on SCr of 1.76 mg/dL (H)). Liver Function Tests: No results for input(s): AST, ALT, ALKPHOS, BILITOT, PROT, ALBUMIN in the last 168 hours. No results for input(s): LIPASE,  AMYLASE in the last 168 hours. No results for input(s): AMMONIA in the last 168 hours. Coagulation Profile: No results for input(s): INR, PROTIME in the last 168 hours. Cardiac Enzymes: Recent Labs  Lab 07/15/17 1036  TROPONINI 18.41*   BNP (last 3 results) No results for input(s): PROBNP in the last 8760 hours. HbA1C: No results for input(s): HGBA1C in the last 72 hours. CBG: Recent Labs  Lab 07/21/17 0601 07/21/17 1129 07/21/17 1618 07/21/17 2105 07/22/17 0613  GLUCAP 210* 331* 275* 362* 314*   Lipid Profile: No results for input(s): CHOL, HDL, LDLCALC, TRIG, CHOLHDL, LDLDIRECT in the last 72 hours. Thyroid Function Tests: No results for input(s): TSH, T4TOTAL, FREET4, T3FREE, THYROIDAB in the last 72 hours. Anemia Panel: No results for input(s): VITAMINB12, FOLATE, FERRITIN, TIBC, IRON, RETICCTPCT in the last 72 hours.    Radiology Studies: I have reviewed all of the imaging during this hospital visit personally     Scheduled Meds: . amiodarone  200 mg Oral BID  . apixaban  5 mg Oral BID  . aspirin EC  81 mg Oral Daily  . atorvastatin  40 mg Oral q1800  . chlorhexidine  15 mL Mouth Rinse BID  .  clopidogrel  75 mg Oral Daily  . ferrous sulfate  325 mg Oral TID WC  . furosemide  80 mg Intravenous BID  . hydrALAZINE  25 mg Oral Q8H  . Influenza vac split quadrivalent PF  0.5 mL Intramuscular Tomorrow-1000  . insulin aspart  0-5 Units Subcutaneous QHS  . insulin aspart  0-9 Units Subcutaneous TID WC  . insulin glargine  30 Units Subcutaneous BID  . isosorbide mononitrate  30 mg Oral Daily  . magnesium oxide  400 mg Oral Daily  . mouth rinse  15 mL Mouth Rinse q12n4p  . metolazone  10 mg Oral Daily  . metoprolol succinate  25 mg Oral Daily  . omega-3 acid ethyl esters  2 g Oral Q breakfast  . omega-3 acid ethyl esters  3 g Oral BID AC  . polyethylene glycol  17 g Oral BID  . sertraline  100 mg Oral Daily  . tiotropium  18 mcg Inhalation QHS   Continuous  Infusions:   LOS: 8 days        Alaira Level Gerome Apley, MD [facility] Pager [phone]

## 2017-07-22 NOTE — Progress Notes (Signed)
Patient placed himself on home CPAP unit for the night. Oxygen set at 2lpm.

## 2017-07-22 NOTE — Progress Notes (Signed)
CARDIAC REHAB PHASE I   PRE:  Rate/Rhythm: 69 SR    BP: sitting 150/80    SaO2: 92 RA  MODE:  Ambulation: 84  ft   POST:  Rate/Rhythm: 88 SR    BP: sitting 150/54     SaO2: 91 RA  Pt  Willing to walk, able to get out of bed and use RW. [place] than he previously was. Able to walk independently with supervision assist x2 and RW. To recliner. Denied CP or SOB, VSS. Encouraged short walks over weekend.  [place], ACSM 07/22/2017 1:45 PM

## 2017-07-22 NOTE — Progress Notes (Addendum)
Nutrition Brief Note  RD consulted for diet education on Heart Healthy, Carb Modified diet with fluid restrictions.   Pt reports that he wanted RD to speak with his wife, who was not present at time of visit. Per pt, wife will not be available for another 4 hours or so. He reports that had questions about some of the food that was coming up on meal trays. Per pt, wife very knowledgeable about diet and feels comfortable managing meal preparation at home.   Pt eating well, however, complains of difficulty chewing, due to lack of teeth. Assisted pt in identifying menu items that were easier fo him to chew and swallow.   RD Provided "Heart Healthy, Carbohydrate Modified Nutrition Therapy" handout from [facility] Nutrition Care Manual. RD also left contact information so wife can reach out to RD at her leisure.   Current diet order is Heart Healthy/Carb Modified, patient is consuming approximately 100% of meals at this time. Labs and medications reviewed.   No further nutrition interventions warranted at this time. If nutrition issues arise, please consult RD.   Kyndra Condron A. Jimmye Norman, RD, LDN, CDE Pager: [phone] After hours Pager: [phone]

## 2017-07-23 ENCOUNTER — Inpatient Hospital Stay (HOSPITAL_COMMUNITY): Payer: Medicare Other

## 2017-07-23 LAB — BASIC METABOLIC PANEL
ANION GAP: 10 (ref 5–15)
BUN: 32 mg/dL — ABNORMAL HIGH (ref 6–20)
CALCIUM: 9 mg/dL (ref 8.9–10.3)
CO2: 34 mmol/L — ABNORMAL HIGH (ref 22–32)
Chloride: 85 mmol/L — ABNORMAL LOW (ref 101–111)
Creatinine, Ser: 1.6 mg/dL — ABNORMAL HIGH (ref 0.61–1.24)
GFR, EST AFRICAN AMERICAN: 50 mL/min — AB (ref >60)
GFR, EST NON AFRICAN AMERICAN: 43 mL/min — AB (ref >60)
GLUCOSE: 199 mg/dL — AB (ref 65–99)
Potassium: 4 mmol/L (ref 3.5–5.1)
Sodium: 129 mmol/L — ABNORMAL LOW (ref 135–145)

## 2017-07-23 LAB — GLUCOSE, CAPILLARY
GLUCOSE-CAPILLARY: 189 mg/dL — AB (ref 65–99)
GLUCOSE-CAPILLARY: 266 mg/dL — AB (ref 65–99)
Glucose-Capillary: 251 mg/dL — ABNORMAL HIGH (ref 65–99)
Glucose-Capillary: 256 mg/dL — ABNORMAL HIGH (ref 65–99)

## 2017-07-23 MED ORDER — ISOSORBIDE MONONITRATE ER 30 MG PO TB24: 30.0000 mg | ORAL_TABLET | Freq: Every day | ORAL | Status: DC

## 2017-07-23 MED ORDER — TORSEMIDE 20 MG PO TABS
20.0000 mg | ORAL_TABLET | Freq: Two times a day (BID) | ORAL | Status: DC
  Administered 2017-07-23: 20 mg via ORAL

## 2017-07-23 MED ORDER — POTASSIUM CHLORIDE CRYS ER 20 MEQ PO TBCR
20.0000 meq | EXTENDED_RELEASE_TABLET | Freq: Two times a day (BID) | ORAL | Status: DC
  Administered 2017-07-23 – 2017-07-25 (×5): 20 meq via ORAL
  Filled 2017-07-23 (×5): qty 1

## 2017-07-23 MED ORDER — METOPROLOL SUCCINATE ER 25 MG PO TB24
25.0000 mg | ORAL_TABLET | Freq: Two times a day (BID) | ORAL | Status: DC
  Administered 2017-07-23 – 2017-07-26 (×6): 25 mg via ORAL
  Filled 2017-07-23 (×7): qty 1

## 2017-07-23 MED ORDER — FUROSEMIDE 10 MG/ML IJ SOLN
120.0000 mg | Freq: Two times a day (BID) | INTRAVENOUS | Status: AC
  Administered 2017-07-23 – 2017-07-24 (×3): 120 mg via INTRAVENOUS
  Filled 2017-07-23 (×3): qty 12

## 2017-07-23 MED ORDER — FUROSEMIDE 40 MG PO TABS: 40.0000 mg | ORAL_TABLET | Freq: Two times a day (BID) | ORAL | Status: DC

## 2017-07-23 NOTE — Progress Notes (Signed)
1005-1030 Attempted ambulation for second time this morning (8366) and pt refused for second time. Expressed the importance of ambulation, and pt's wife states he will walk this afternoon.   Completed MI education with patient and patient's wife including risk factor modification, CP, NTG use and calling 911, heart healthy and diabetic diet handout given. Discussed activity progression and phase 2 cardiac rehab. Permission given to send referral to [facility], although patient does not get out much and is not likely to attend. Pt sleepy during education, but wife was very receptive. Reinforcement of risk factor modification needed. Sol Passer, MS, ACSM CEP

## 2017-07-23 NOTE — Progress Notes (Signed)
Progress Note  Patient Name: Jose [name] Date of Encounter: 07/23/2017  Primary Cardiologist: [facility] Patient Profile     66 y.o. male with hx of narcotic use and atypical chest pain presenting with extensive anteroapical and inferior infarction, unsuccessful attempt at revascularization of occluded distal LAD, chronic occlusion of RCA.   STEMI DAPT x 54m Echo EF 40% with LV apical aneurysm  Atrial fibrillation with rapid ventricular response  Treated with amiodarone.   CHADSVasc 5 (age, hypertension, diabetes, heart failure, CAD). Now apixoban  Moderate acute renal failure related to contrast nephrotoxicity/retention, improving.      Subjective   Denies sob  But still quite swollen   Inpatient Medications    Scheduled Meds: . amiodarone  200 mg Oral BID  . apixaban  5 mg Oral BID  . aspirin EC  81 mg Oral Daily  . atorvastatin  40 mg Oral q1800  . chlorhexidine  15 mL Mouth Rinse BID  . clopidogrel  75 mg Oral Daily  . ferrous sulfate  325 mg Oral Q breakfast  . hydrALAZINE  50 mg Oral Q8H  . Influenza vac split quadrivalent PF  0.5 mL Intramuscular Tomorrow-1000  . insulin aspart  0-5 Units Subcutaneous QHS  . insulin aspart  0-9 Units Subcutaneous TID WC  . insulin glargine  30 Units Subcutaneous BID  . isosorbide mononitrate  30 mg Oral Daily  . magnesium oxide  400 mg Oral Daily  . mouth rinse  15 mL Mouth Rinse q12n4p  . metoprolol succinate  25 mg Oral Daily  . omega-3 acid ethyl esters  2 g Oral Q breakfast  . omega-3 acid ethyl esters  3 g Oral BID AC  . polyethylene glycol  17 g Oral BID  . sertraline  100 mg Oral Daily  . tiotropium  18 mcg Inhalation QHS  . torsemide  20 mg Oral BID   Continuous Infusions:  PRN Meds: acetaminophen, albuterol, morphine injection, nitroGLYCERIN, ondansetron (ZOFRAN) IV, oxyCODONE-acetaminophen   Vital Signs    Vitals:   07/22/17 2003 07/22/17 2013 07/23/17 0000 07/23/17 0522  BP: (!) 167/82   (!) 144/47  Pulse: 70    (!) 58  Resp: 17  18 17   Temp: 98 F (36.7 C)   97.8 F (36.6 C)  TempSrc: Oral   Oral  SpO2: 96% 96%  94%  Weight:    290 lb 6.4 oz (131.7 kg)  Height:        Intake/Output Summary (Last 24 hours) at 07/23/2017 1101 Last data filed at 07/23/2017 0900 Gross per 24 hour  Intake 615 ml  Output 800 ml  Net -185 ml   Filed Weights   07/20/17 0530 07/22/17 0458 07/23/17 0522  Weight: 297 lb 6.4 oz (134.9 kg) 293 lb 1.6 oz (132.9 kg) 290 lb 6.4 oz (131.7 kg)    Telemetry    Sinus rhythm- Personally reviewed    ECG    N/a - Personally Reviewed  Physical Exam   Well developed and nourished in no acute distress HENT normal Neck supple with JVP-unable to discern  Clear but decreased BS Regular rate and rhythm, no murmurs or gallops Abd-soft with active BS No Clubbing cyanosis 2-3+edema Skin-warm and dry A & Oriented  Grossly normal sensory and motor function   Labs    Chemistry Recent Labs  Lab 07/21/17 0224 07/22/17 0204 07/23/17 0254  NA 132* 131* 129*  K 4.3 4.5 4.0  CL 91* 88* 85*  CO2 31 32  34*  GLUCOSE 197* 347* 199*  BUN 33* 37* 32*  CREATININE 1.44* 1.76* 1.60*  CALCIUM 8.8* 8.9 9.0  GFRNONAA 49* 39* 43*  GFRAA 57* 45* 50*  ANIONGAP 10 11 10      Hematology Recent Labs  Lab 07/18/17 0225 07/19/17 0221 07/20/17 0602  WBC 9.0 8.4 10.0  RBC 3.48* 3.77* 3.97*  HGB 9.7* 10.7* 11.1*  HCT 29.7* 32.7* 35.3*  MCV 85.3 86.7 88.9  MCH 27.9 28.4 28.0  MCHC 32.7 32.7 31.4  RDW 14.7 15.0 14.8  PLT 236 276 283    Cardiac Enzymes No results for input(s): TROPONINI in the last 168 hours.  No results for input(s): TROPIPOC in the last 168 hours.   BNP No results for input(s): BNP, PROBNP in the last 168 hours.   DDimer No results for input(s): DDIMER in the last 168 hours.   Radiology    No results found.  Cardiac Studies   Cardiac cath July 14, 2017  -unsuccessful attempted PTCA of occluded distal LAD, chronic occlusion of  RCA Diagnostic Diagram       Post-Intervention Diagram        Echo July 14, 2017 - Left ventricle: LVEF is approximately 40% with akinesis of the distal inferoseptal wall and apex. Aneurysmal dilatation of apex. Consider limited echo with contrast to evaluate for thrombus. The cavity size was normal. Wall thickness was increased in a pattern of mild LVH. - Mitral valve: Calcified annulus. Mildly thickened leaflets .   Assessment & Plan    CAD s/p STEMI:  Unsuccessful PCI to distal LAD occlusion and not optimal surgical candidate at this time; echo consistent with aneurysmal dilation of apex.  --asa 81mg  daily x 1 month, plavix 75mg  daily, atorvastatin 40mg  daily, lopresor 12.5mg  BID --Eliquis for LV dilation  Acute on chronic systolic CHF:  Ins/outs not accurately recorded; down 11lbs from admission (and lowest recorded weight in our system). --continue IV lasix increase to 120  Hold on .metalzone 2/2 impact on CR   Will diurese vig until Cr bump    AFib:  Maintaining sinus rhythm. Plan current dose of amiodarone 4 weeks (200mg  BID), then decrease to maintenance dose of 200 mg daily   --amiodarone 200mg  BID --Eliquis   HTN: Will increase hydralazine and add isdn   Acute renal failure on CKD stage III:    .  OSA: Encouraged patient to wear CPAP tonight.  DM:  IM assisting in DM management.  COPD/Smoking:  Stable. Strongly recommend permanent smoking cessation.  Hyponatremia:  down again  But volume overloaded so will continue diuresis  Will replete with CL      Signed, Virl Axe, MD  07/23/2017, 11:01 AM    I have personally seen and examined this patient with Dr. Jari Favre. I agree with the assessment and plan as outlined above. He is still volume overloaded. Will continue IV Lasix today. I would not resume metolazone given his renal insufficiency. He is in sinus today. Continue amiodarone and Eliquis. Will continue DAPT with ASA and Plavix  for now. The plan right now is for medical management of his CAD but given the RCA CTO and moderate disease in the ostium of the LAD and Circumflex. He is not a great surgical candidate at this time, but he may be considered for surgery once diuresed. He is having no angina currently.   Virl Axe 07/23/2017 11:01 AM

## 2017-07-23 NOTE — Progress Notes (Signed)
PROGRESS NOTE    Jose [name]  WUJ:[id number] DOB: 07-17-1951 DOA: 07/14/2017 PCP: Cleophas Dunker, MD    Brief Narrative:  66 year old male who presents with chest pain and dyspnea. He is known to have coronary artery disease, COPD, dyslipidemia, type 2 diabetes mellitus,hypertension, obesity and tobacco abuse. He had chest pain intermittently for last 3 days prior to hospitalization, associated with dyspnea on exertion. Due to his persistent symptoms he was brought to the hospital, he was found in atrial fibrillation and his electrocardiogram showed ST elevations in the inferior lateral leads, his troponin was 20, and he was immediately taken to the cardiac catheterization lab. On his initial physical examination blood pressure 115/57, heart rate 52, temperature 98.9, oxygen saturation 93% his lungs were clear to auscultation bilaterally heart S1-S2 present, irregularly irregular no S3 or S4 gallop, no murmurs, his abdomen was soft nontender, 1+ lower extremity edema  Cardiac catheterization he was found to have total occlusion of the mid and apical LAD, he underwent Unsuccessful PCI to the LAD. He also was found to have chronic occlusion of the RCA, ejection fraction is 40%.   He developed acute kidney injury. He was placed on amiodarone and started on aggressive medical therapy for his coronary artery disease. Likely not a good surgical candidate.  Assessment & Plan:   Principal Problem:   STEMI (ST elevation myocardial infarction) ([place]) Active Problems:   COPD (chronic obstructive pulmonary disease) (HCC)   HTN (hypertension)   Diabetes (HCC)   Morbid obesity (HCC)   OSA (obstructive sleep apnea)   Chronic diastolic CHF (congestive heart failure) (HCC)   Atrial fibrillation with rapid ventricular response (HCC)   STEMI involving left anterior descending coronary artery (HCC)   Acute on chronic systolic CHF (congestive heart failure), NYHA class 3 ([place])   Coronary artery disease  involving native coronary artery of native heart with unstable angina pectoris (HCC)   PAF (paroxysmal atrial fibrillation) ([place])  1. Coronary disease status post ST elevation myocardial infarction.Agressive medical therapy with aspirin -clopidogrel, atorvastatin, metoprolol, hydralazine and isosorbide.   2. Atrial fibrillation, new onset. Amiodarone and metoprolol for rate control continue  anticoagulation with apixaban.   3. Systolic heart failure, acute on chronic, LV EF 40%, with akinesis of the distal inferior-septal wall and apex.Continue diuresis, will transition to torsemide, urine output over last 24 hours, 800 cc, weight down 1 Kg, will continue after load reduction with hydralazine and isosorbide, continue b blockler, will add ace inh once renal function more stable. Will coordinate with cardiology discharge.  4. Acute kidney injury chronic kidney disease stage III. Renal function stable with serum cr at 1,60 with k at 4,0 and serum NA at 129, will follow on renal panel in am, continue diuresis with torsemide.   5. Type 2 diabetes mellitus.Capillary glucose 367, 255, 293, 189, 251, will asal insulin at 30 units bid of glargine. Patient tolerating well po.   6. HTN. Systolic blood pressure 956 to 160, will control with metoprolol, hydralazine and isosorbide. Continue diuresis.  6. COPD.with no clinical signs of exacerbation,on albuterolplus tiotropium.  DVT prophylaxis:apixaban Code Status:full Family Communication:I spoke with patient's family at the bedside and all questions were addressed. Disposition Plan:Home   Consultants:    Procedures:  Cardiac catheterization  Antimicrobials:     Subjective: Continue to improve lower extremity edema, but still persistent, no dyspnea, chest pain or palpitations, no nausea or vomiting, has been out of bed.   Objective: Vitals:   07/22/17 2003 07/22/17 2013  07/23/17 0000 07/23/17 0522  BP: (!)  167/82   (!) 144/47  Pulse: 70   (!) 58  Resp: 17  18 17   Temp: 98 F (36.7 C)   97.8 F (36.6 C)  TempSrc: Oral   Oral  SpO2: 96% 96%  94%  Weight:    131.7 kg (290 lb 6.4 oz)  Height:        Intake/Output Summary (Last 24 hours) at 07/23/2017 0920 Last data filed at 07/22/2017 2030 Gross per 24 hour  Intake 375 ml  Output 800 ml  Net -425 ml   Filed Weights   07/20/17 0530 07/22/17 0458 07/23/17 0522  Weight: 134.9 kg (297 lb 6.4 oz) 132.9 kg (293 lb 1.6 oz) 131.7 kg (290 lb 6.4 oz)    Examination:   General: Not in pain or dyspnea, deconditioned Neurology: Awake and alert, non focal  E ENT: no pallor, no icterus, oral mucosa moist Cardiovascular: No JVD. S1-S2 present, rhythmic, no gallops, rubs, or murmurs. ++/+++ pitting lower extremity edema. Pulmonary: vesicular breath sounds bilaterally, adequate air movement, no wheezing, rhonchi or rales. Gastrointestinal. Abdomen protuberant, no organomegaly, non tender, no rebound or guarding Skin. No rashes Musculoskeletal: no joint deformities     Data Reviewed: I have personally reviewed following labs and imaging studies  CBC: Recent Labs  Lab 07/17/17 0248 07/18/17 0225 07/19/17 0221 07/20/17 0602  WBC 10.7* 9.0 8.4 10.0  NEUTROABS  --   --   --  7.8*  HGB 10.5* 9.7* 10.7* 11.1*  HCT 33.0* 29.7* 32.7* 35.3*  MCV 86.2 85.3 86.7 88.9  PLT 227 236 276 469   Basic Metabolic Panel: Recent Labs  Lab 07/19/17 0221 07/20/17 0602 07/21/17 0224 07/22/17 0204 07/23/17 0254  NA 129* 131* 132* 131* 129*  K 3.8 4.7 4.3 4.5 4.0  CL 89* 89* 91* 88* 85*  CO2 29 31 31  32 34*  GLUCOSE 129* 215* 197* 347* 199*  BUN 46* 37* 33* 37* 32*  CREATININE 1.76* 1.55* 1.44* 1.76* 1.60*  CALCIUM 8.5* 8.8* 8.8* 8.9 9.0   GFR: Estimated Creatinine Clearance: 64.6 mL/min (A) (by C-G formula based on SCr of 1.6 mg/dL (H)). Liver Function Tests: No results for input(s): AST, ALT, ALKPHOS, BILITOT, PROT, ALBUMIN in the last 168  hours. No results for input(s): LIPASE, AMYLASE in the last 168 hours. No results for input(s): AMMONIA in the last 168 hours. Coagulation Profile: No results for input(s): INR, PROTIME in the last 168 hours. Cardiac Enzymes: No results for input(s): CKTOTAL, CKMB, CKMBINDEX, TROPONINI in the last 168 hours. BNP (last 3 results) No results for input(s): PROBNP in the last 8760 hours. HbA1C: No results for input(s): HGBA1C in the last 72 hours. CBG: Recent Labs  Lab 07/22/17 0613 07/22/17 1102 07/22/17 1621 07/22/17 2056 07/23/17 0530  GLUCAP 314* 367* 255* 293* 189*   Lipid Profile: No results for input(s): CHOL, HDL, LDLCALC, TRIG, CHOLHDL, LDLDIRECT in the last 72 hours. Thyroid Function Tests: No results for input(s): TSH, T4TOTAL, FREET4, T3FREE, THYROIDAB in the last 72 hours. Anemia Panel: No results for input(s): VITAMINB12, FOLATE, FERRITIN, TIBC, IRON, RETICCTPCT in the last 72 hours.    Radiology Studies: I have reviewed all of the imaging during this hospital visit personally     Scheduled Meds: . amiodarone  200 mg Oral BID  . apixaban  5 mg Oral BID  . aspirin EC  81 mg Oral Daily  . atorvastatin  40 mg Oral q1800  . chlorhexidine  15 mL Mouth Rinse BID  . clopidogrel  75 mg Oral Daily  . ferrous sulfate  325 mg Oral Q breakfast  . hydrALAZINE  50 mg Oral Q8H  . Influenza vac split quadrivalent PF  0.5 mL Intramuscular Tomorrow-1000  . insulin aspart  0-5 Units Subcutaneous QHS  . insulin aspart  0-9 Units Subcutaneous TID WC  . insulin glargine  30 Units Subcutaneous BID  . isosorbide mononitrate  30 mg Oral Daily  . magnesium oxide  400 mg Oral Daily  . mouth rinse  15 mL Mouth Rinse q12n4p  . metoprolol succinate  25 mg Oral Daily  . omega-3 acid ethyl esters  2 g Oral Q breakfast  . omega-3 acid ethyl esters  3 g Oral BID AC  . polyethylene glycol  17 g Oral BID  . sertraline  100 mg Oral Daily  . tiotropium  18 mcg Inhalation QHS  .  torsemide  20 mg Oral BID   Continuous Infusions:   LOS: 9 days        Kitzia Camus Gerome Apley, MD [facility] Pager [phone]

## 2017-07-24 DIAGNOSIS — R251 Tremor, unspecified: Secondary | ICD-10-CM

## 2017-07-24 LAB — GLUCOSE, CAPILLARY
GLUCOSE-CAPILLARY: 246 mg/dL — AB (ref 65–99)
GLUCOSE-CAPILLARY: 313 mg/dL — AB (ref 65–99)
Glucose-Capillary: 235 mg/dL — ABNORMAL HIGH (ref 65–99)
Glucose-Capillary: 292 mg/dL — ABNORMAL HIGH (ref 65–99)

## 2017-07-24 LAB — BASIC METABOLIC PANEL WITH GFR
Anion gap: 12 (ref 5–15)
BUN: 36 mg/dL — ABNORMAL HIGH (ref 6–20)
CO2: 34 mmol/L — ABNORMAL HIGH (ref 22–32)
Calcium: 9.1 mg/dL (ref 8.9–10.3)
Chloride: 80 mmol/L — ABNORMAL LOW (ref 101–111)
Creatinine, Ser: 1.7 mg/dL — ABNORMAL HIGH (ref 0.61–1.24)
GFR calc Af Amer: 47 mL/min — ABNORMAL LOW
GFR calc non Af Amer: 40 mL/min — ABNORMAL LOW
Glucose, Bld: 293 mg/dL — ABNORMAL HIGH (ref 65–99)
Potassium: 4.2 mmol/L (ref 3.5–5.1)
Sodium: 126 mmol/L — ABNORMAL LOW (ref 135–145)

## 2017-07-24 LAB — MAGNESIUM: Magnesium: 1.8 mg/dL (ref 1.7–2.4)

## 2017-07-24 LAB — CK: CK TOTAL: 124 U/L (ref 49–397)

## 2017-07-24 MED ORDER — AMLODIPINE BESYLATE 5 MG PO TABS
5.0000 mg | ORAL_TABLET | Freq: Every day | ORAL | Status: DC
  Administered 2017-07-24 – 2017-07-25 (×2): 5 mg via ORAL
  Filled 2017-07-24 (×2): qty 1

## 2017-07-24 NOTE — Progress Notes (Signed)
Progress Note  Patient Name: Jose [name] Date of Encounter: 07/24/2017  Primary Cardiologist: Branch Patient Profile     66 y.o. male with hx of narcotic use and atypical chest pain presenting with extensive anteroapical and inferior infarction, unsuccessful attempt at revascularization of occluded distal LAD, chronic occlusion of RCA.   STEMI DAPT x 87m Echo EF 40% with LV apical aneurysm  Atrial fibrillation with rapid ventricular response  Treated with amiodarone.   CHADSVasc 5 (age, hypertension, diabetes, heart failure, CAD). Now apixoban  Moderate acute renal failure related to contrast nephrotoxicity/retention, improving.      Subjective   Bad night   Constipation finally resolved with freq BM Also tremulousness and unable to hold cup This has happened previously with statins Some what improved presently   Inpatient Medications    Scheduled Meds: . amiodarone  200 mg Oral BID  . apixaban  5 mg Oral BID  . aspirin EC  81 mg Oral Daily  . atorvastatin  40 mg Oral q1800  . chlorhexidine  15 mL Mouth Rinse BID  . clopidogrel  75 mg Oral Daily  . ferrous sulfate  325 mg Oral Q breakfast  . hydrALAZINE  50 mg Oral Q8H  . Influenza vac split quadrivalent PF  0.5 mL Intramuscular Tomorrow-1000  . insulin aspart  0-5 Units Subcutaneous QHS  . insulin aspart  0-9 Units Subcutaneous TID WC  . insulin glargine  30 Units Subcutaneous BID  . isosorbide mononitrate  30 mg Oral Daily  . magnesium oxide  400 mg Oral Daily  . mouth rinse  15 mL Mouth Rinse q12n4p  . metoprolol succinate  25 mg Oral BID  . omega-3 acid ethyl esters  2 g Oral Q breakfast  . omega-3 acid ethyl esters  3 g Oral BID AC  . polyethylene glycol  17 g Oral BID  . potassium chloride  20 mEq Oral BID  . sertraline  100 mg Oral Daily  . tiotropium  18 mcg Inhalation QHS   Continuous Infusions: . furosemide Stopped (07/23/17 1939)   PRN Meds: acetaminophen, albuterol, morphine injection,  nitroGLYCERIN, ondansetron (ZOFRAN) IV, oxyCODONE-acetaminophen   Vital Signs    Vitals:   07/23/17 1621 07/23/17 2039 07/23/17 2134 07/24/17 0359  BP: (!) 123/53  (!) 177/74 137/63  Pulse: (!) 57  69 (!) 57  Resp: 11  15 15   Temp: 97.7 F (36.5 C)  (!) 97.4 F (36.3 C) 97.7 F (36.5 C)  TempSrc: Oral  Axillary Oral  SpO2: 92% 94% 94% 97%  Weight:    288 lb 3.2 oz (130.7 kg)  Height:        Intake/Output Summary (Last 24 hours) at 07/24/2017 4034 Last data filed at 07/23/2017 1900 Gross per 24 hour  Intake 960 ml  Output -  Net 960 ml   Filed Weights   07/22/17 0458 07/23/17 0522 07/24/17 0359  Weight: 293 lb 1.6 oz (132.9 kg) 290 lb 6.4 oz (131.7 kg) 288 lb 3.2 oz (130.7 kg)    Telemetry     bradycardia Sinus- Personally reviewed    ECG    N/a - Personally Reviewed  Physical Exam   Well developed and Morbidly obese in no distress but sleepy HENT normal Neck supple with JVP-unable to discern Clear Regular rate and rhythm, no murmurs or gallops Abd-soft with active BS No Clubbing cyanosis 3+edema Skin-warm and dry A & Oriented  Grossly normal sensory and motor function     Labs  Chemistry Recent Labs  Lab 07/22/17 0204 07/23/17 0254 07/24/17 0205  NA 131* 129* 126*  K 4.5 4.0 4.2  CL 88* 85* 80*  CO2 32 34* 34*  GLUCOSE 347* 199* 293*  BUN 37* 32* 36*  CREATININE 1.76* 1.60* 1.70*  CALCIUM 8.9 9.0 9.1  GFRNONAA 39* 43* 40*  GFRAA 45* 50* 47*  ANIONGAP 11 10 12      Hematology Recent Labs  Lab 07/18/17 0225 07/19/17 0221 07/20/17 0602  WBC 9.0 8.4 10.0  RBC 3.48* 3.77* 3.97*  HGB 9.7* 10.7* 11.1*  HCT 29.7* 32.7* 35.3*  MCV 85.3 86.7 88.9  MCH 27.9 28.4 28.0  MCHC 32.7 32.7 31.4  RDW 14.7 15.0 14.8  PLT 236 276 283    Cardiac Enzymes No results for input(s): TROPONINI in the last 168 hours.  No results for input(s): TROPIPOC in the last 168 hours.   BNP No results for input(s): BNP, PROBNP in the last 168 hours.   DDimer  No results for input(s): DDIMER in the last 168 hours.   Radiology    Dg Chest 2 View  Result Date: 07/23/2017 CLINICAL DATA:  Pleuritic chest pain. EXAM: CHEST  2 VIEW COMPARISON:  07/17/2017 FINDINGS: Stable enlargement of the cardiac silhouette. Lungs are clear without airspace disease or pulmonary edema. Improved aeration in the right lower lung compared to the recent comparison. Trachea is midline. Negative for a pneumothorax. Bony thorax is intact. No large pleural effusions. Bridging osteophytes in the thoracic spine. IMPRESSION: No active cardiopulmonary disease. Stable cardiomegaly. Electronically Signed   By: Markus Daft M.D.   On: 07/23/2017 12:30   Personally reviewed    Cardiac Studies   Cardiac cath July 14, 2017  -unsuccessful attempted PTCA of occluded distal LAD, chronic occlusion of RCA Diagnostic Diagram       Post-Intervention Diagram        Echo July 14, 2017 - Left ventricle: LVEF is approximately 40% with akinesis of the distal inferoseptal wall and apex. Aneurysmal dilatation of apex. Consider limited echo with contrast to evaluate for thrombus. The cavity size was normal. Wall thickness was increased in a pattern of mild LVH. - Mitral valve: Calcified annulus. Mildly thickened leaflets .   Assessment & Plan    CAD s/p STEMI:  Unsuccessful PCI to distal LAD occlusion and not optimal surgical candidate at this time; echo consistent with aneurysmal dilation of apex.  --asa 81mg  daily x 1 month, plavix 75mg  daily , lopresor 12.5mg  BID --Eliquis for LV dilation  Acute on chronic systolic CHF:  Ins/outs not accurately recorded; down 11lbs from admission (and lowest recorded weight in our system). --continue IV lasix > increase to 120   CR   Will diurese vig until Cr bump   CXR impressively clear    AFib:  Maintaining sinus rhythm. Plan current dose of amiodarone 4 weeks (200mg  BID), then decrease to maintenance dose of 200 mg daily     --amiodarone 200mg  BID --Eliquis   HTN:  Acute renal failure on CKD stage III:    . Hyponatremia:  With BS correction 2.93 X 1.6  = 4.8  + 126 = 131 so not too bad   down again  But volume overloaded so will continue diuresis  Tremulousness and hand instablity  ? Statin reaction   Continue vigorous diuresis today Consider leg wraps  Stop statin 2/2 neurological reaction seen previously with statins Given the resolution of most of the symptoms and that he is already on triple  therapy and without headache unlikely bleed and so would continue current anticoagulation regime  BP still elevated  Add amlodipine ( even w edema)        Virl Axe 07/24/2017 9:37 AM

## 2017-07-24 NOTE — Progress Notes (Signed)
PROGRESS NOTE    Jose [name]  YWV:[id number] DOB: 12/24/1950 DOA: 07/14/2017 PCP: Cleophas Dunker, MD    Brief Narrative:  66 year old male who presents with chest pain and dyspnea. He is known to have coronary artery disease, COPD, dyslipidemia, type 2 diabetes mellitus,hypertension, obesity and tobacco abuse. He had chest pain intermittently for last 3 days prior to hospitalization, associated with dyspnea on exertion. Due to his persistent symptoms he was brought to the hospital, he was found in atrial fibrillation and his electrocardiogram showed ST elevations in the inferior lateral leads, his troponin was 20, and he was immediately taken to the cardiac catheterization lab. On his initial physical examination blood pressure 115/57, heart rate 52, temperature 98.9, oxygen saturation 93% his lungs were clear to auscultation bilaterally heart S1-S2 present, irregularly irregular no S3 or S4 gallop, no murmurs, his abdomen was soft nontender, 1+ lower extremity edema  Cardiac catheterization he was found to have total occlusion of the mid and apical LAD, he underwent Unsuccessful PCI to the LAD. He also was found to have chronic occlusion of the RCA, ejection fraction is 40%.   He developed acute kidney injury. He was placed on amiodarone and started on aggressive medical therapy for his coronary artery disease. Likely not a good surgical candidate.   Assessment & Plan:   Principal Problem:   STEMI (ST elevation myocardial infarction) ([place]) Active Problems:   COPD (chronic obstructive pulmonary disease) (H[facility])   HTN (hypertension)   Diabetes (HCC)   Morbid obesity (HCC)   OSA (obstructive sleep apnea)   Chronic diastolic CHF (congestive heart failure) (H[facility])   Atrial fibrillation with rapid ventricular response ([facility])   STEMI involving left anterior descending coronary artery (H[facility])   Acute on chronic systolic CHF (congestive heart failure), NYHA class 3 ([place])   Coronary artery disease  involving native coronary artery of native heart with unstable angina pectoris (HCC)   PAF (paroxysmal atrial fibrillation) ([place])   1. Coronary disease status post ST elevation myocardial infarction.Continue aspirin -clopidogrel, atorvastatin, metoprolol, hydralazine and isosorbide. Will check Ck, per patient's wife patient had intolerance to statins the past, on exam no myalgias.   2. Atrial fibrillation, new onset.Continue Amiodarone and metoprolol with good rate control, anticoagulation with apixaban.   3. Systolic heart failure, acute on chronic, LV EF 40%, with akinesis of the distal inferior-septal wall and apex.patient has been placed on furosemide 120 mg bid, patient has lost 1 kg over last 24 hours. Will continue to target negative fluid balance. Will add unna boots for venous insufficiency.   4. Acute kidney injury chronic kidney disease stage III. serum cr now up to 1,70 with K at 4,2 and serum bicarbonate at 34. Serum Na down to 126, Will continue close follow up of renal function and electrolytes. No documentation on urine out put.   5. Type 2 diabetes mellitus.Capillary glucose 251, 266, 256, 235, 246, on 30 units bidof glargine and sliding scale for glucose cover and monitoring, will continue to calculate insulin requirements before further adjusting insulin therapy.   6. HTN. On metoprolol, hydralazine and isosorbide. Systolic blood pressure 854 to 170.   6. COPD.No acuteexacerbation,continue albuteroland tiotropium.  DVT prophylaxis:apixaban Code Status:full Family Communication:I spoke with patient's family at the bedside and all questions were addressed. Disposition Plan:Home   Consultants:    Procedures:  Cardiac catheterization  Antimicrobials  Subjective: Patient not feeling well, last night has severe cramps, aches and pain, positive bowel movement. This am very deconditioned.  Objective: Vitals:   07/23/17 1621 07/23/17  2039 07/23/17 2134 07/24/17 0359  BP: (!) 123/53  (!) 177/74 137/63  Pulse: (!) 57  69 (!) 57  Resp: 11  15 15   Temp: 97.7 F (36.5 C)  (!) 97.4 F (36.3 C) 97.7 F (36.5 C)  TempSrc: Oral  Axillary Oral  SpO2: 92% 94% 94% 97%  Weight:    130.7 kg (288 lb 3.2 oz)  Height:        Intake/Output Summary (Last 24 hours) at 07/24/2017 0824 Last data filed at 07/23/2017 1900 Gross per 24 hour  Intake 1200 ml  Output -  Net 1200 ml   Filed Weights   07/22/17 0458 07/23/17 0522 07/24/17 0359  Weight: 132.9 kg (293 lb 1.6 oz) 131.7 kg (290 lb 6.4 oz) 130.7 kg (288 lb 3.2 oz)    Examination:   General: deconditioned Neurology: Awake and alert, non focal  E ENT: mild pallor, no icterus, oral mucosa moist Cardiovascular: No JVD. S1-S2 present, rhythmic, no gallops, rubs, or murmurs. +++ pitting lower extremity edema more left than right. Pulmonary: vesicular breath sounds bilaterally, adequate air movement, no wheezing, rhonchi or rales. Gastrointestinal. Abdomen protuberant, no organomegaly, non tender, no rebound or guarding Skin. No rashes Musculoskeletal: no joint deformities, no muscle tenderness.      Data Reviewed: I have personally reviewed following labs and imaging studies  CBC: Recent Labs  Lab 07/18/17 0225 07/19/17 0221 07/20/17 0602  WBC 9.0 8.4 10.0  NEUTROABS  --   --  7.8*  HGB 9.7* 10.7* 11.1*  HCT 29.7* 32.7* 35.3*  MCV 85.3 86.7 88.9  PLT 236 276 299   Basic Metabolic Panel: Recent Labs  Lab 07/20/17 0602 07/21/17 0224 07/22/17 0204 07/23/17 0254 07/24/17 0205  NA 131* 132* 131* 129* 126*  K 4.7 4.3 4.5 4.0 4.2  CL 89* 91* 88* 85* 80*  CO2 31 31 32 34* 34*  GLUCOSE 215* 197* 347* 199* 293*  BUN 37* 33* 37* 32* 36*  CREATININE 1.55* 1.44* 1.76* 1.60* 1.70*  CALCIUM 8.8* 8.8* 8.9 9.0 9.1  MG  --   --   --   --  1.8   GFR: Estimated Creatinine Clearance: 60.6 mL/min (A) (by C-G formula based on SCr of 1.7 mg/dL (H)). Liver Function  Tests: No results for input(s): AST, ALT, ALKPHOS, BILITOT, PROT, ALBUMIN in the last 168 hours. No results for input(s): LIPASE, AMYLASE in the last 168 hours. No results for input(s): AMMONIA in the last 168 hours. Coagulation Profile: No results for input(s): INR, PROTIME in the last 168 hours. Cardiac Enzymes: No results for input(s): CKTOTAL, CKMB, CKMBINDEX, TROPONINI in the last 168 hours. BNP (last 3 results) No results for input(s): PROBNP in the last 8760 hours. HbA1C: No results for input(s): HGBA1C in the last 72 hours. CBG: Recent Labs  Lab 07/23/17 0530 07/23/17 1152 07/23/17 1619 07/23/17 2128 07/24/17 0632  GLUCAP 189* 251* 266* 256* 235*   Lipid Profile: No results for input(s): CHOL, HDL, LDLCALC, TRIG, CHOLHDL, LDLDIRECT in the last 72 hours. Thyroid Function Tests: No results for input(s): TSH, T4TOTAL, FREET4, T3FREE, THYROIDAB in the last 72 hours. Anemia Panel: No results for input(s): VITAMINB12, FOLATE, FERRITIN, TIBC, IRON, RETICCTPCT in the last 72 hours.    Radiology Studies: I have reviewed all of the imaging during this hospital visit personally     Scheduled Meds: . amiodarone  200 mg Oral BID  . apixaban  5 mg Oral BID  .  aspirin EC  81 mg Oral Daily  . atorvastatin  40 mg Oral q1800  . chlorhexidine  15 mL Mouth Rinse BID  . clopidogrel  75 mg Oral Daily  . ferrous sulfate  325 mg Oral Q breakfast  . hydrALAZINE  50 mg Oral Q8H  . Influenza vac split quadrivalent PF  0.5 mL Intramuscular Tomorrow-1000  . insulin aspart  0-5 Units Subcutaneous QHS  . insulin aspart  0-9 Units Subcutaneous TID WC  . insulin glargine  30 Units Subcutaneous BID  . isosorbide mononitrate  30 mg Oral Daily  . magnesium oxide  400 mg Oral Daily  . mouth rinse  15 mL Mouth Rinse q12n4p  . metoprolol succinate  25 mg Oral BID  . omega-3 acid ethyl esters  2 g Oral Q breakfast  . omega-3 acid ethyl esters  3 g Oral BID AC  . polyethylene glycol  17 g Oral  BID  . potassium chloride  20 mEq Oral BID  . sertraline  100 mg Oral Daily  . tiotropium  18 mcg Inhalation QHS   Continuous Infusions: . furosemide Stopped (07/23/17 1939)     LOS: 10 days        Kaizer Dissinger Gerome Apley, MD [facility] Pager [phone]

## 2017-07-24 NOTE — Progress Notes (Signed)
Orthopedic Tech Progress Note Patient Details:  Jose [name] [birth year] [id number]  Ortho Devices Type of Ortho Device: Louretta Parma boot Ortho Device/Splint Location: bilateral Ortho Device/Splint Interventions: Application   [name] 07/24/2017, 11:55 AM

## 2017-07-24 NOTE — Progress Notes (Addendum)
Occupational Therapy Treatment Patient Details Name: Jose [name] MRN: [id number] DOB: [birth year] Today's Date: 07/24/2017    History of present illness Pt is a 66 y.o. male who presented to [facility] on 07/14/17 with chest pain and dyspnea. On arrival to [facility] ED, his EKG showed new onset A-fib with ST elevation in the inferolateral leads; CODE STEMI was called. Cardiac cath 11/15 with unsuccessful attempt at revascularization of occluded distal LAD, chronic occlusion of RCA. Pertinent PMH includes CAD (CTO of RCA, with residual disease in LAD/Lcx), COPD, HL, HTN, obesity,DMand tobacco use.    OT comments  Pt demonstrating progress toward OT goals this session. On arrival, pt's wife reporting that pt spilled coffee on his gown and had bowel movement in bed and needed to be cleaned up. Pt was able to complete UB bathing tasks with supervision seated at EOB and LB bathing and dressing with min guard assist from sit<>stand with RW. Pt demonstrating improved dynamic standing balance and was able to complete dynamic standing tasks with min guard assist this session as well. Pt closing his eyes for the majority of the session but did follow commands and converse with therapist. Of note, his wife does report concern over increased weakness of his extremities and did note pt moving more slowly this session. OT will continue to follow while admitted and initiate strengthening HEP for B UE next session.    Follow Up Recommendations  Home health OT;Supervision/Assistance - 24 hour    Equipment Recommendations  3 in 1 bedside commode    Recommendations for Other Services      Precautions / Restrictions Precautions Precautions: Fall Restrictions Weight Bearing Restrictions: No       Mobility Bed Mobility Overal bed mobility: Needs Assistance Bed Mobility: Supine to Sit     Supine to sit: Min assist     General bed mobility comments: Pt reaching for OT hand to assist with raising trunk from [facility].    Transfers Overall transfer level: Needs assistance Equipment used: Rolling walker (2 wheeled) Transfers: Sit to/from Stand Sit to Stand: Min guard         General transfer comment: Min guard assist for safety. Pt "rocking" up into standing position.     Balance Overall balance assessment: Needs assistance Sitting-balance support: No upper extremity supported;Feet supported Sitting balance-Leahy Scale: Good     Standing balance support: Bilateral upper extremity supported;No upper extremity supported;During functional activity Standing balance-Leahy Scale: Fair Standing balance comment: MIn guard assist for dynamic standing tasks for pericare                           ADL either performed or assessed with clinical judgement   ADL Overall ADL's : Needs assistance/impaired     Grooming: Set up;Sitting   Upper Body Bathing: Supervision/ safety;Sitting   Lower Body Bathing: Min guard;Sit to/from stand   Upper Body Dressing : Supervision/safety;Sitting   Lower Body Dressing: Min guard;Sit to/from stand   Toilet Transfer: Min guard;Ambulation;RW Toilet Transfer Details (indicate cue type and reason): Simulated with sit<>stand followed by ambulation in room.  Toileting- Water quality scientist and Hygiene: Min guard;Sit to/from stand       Functional mobility during ADLs: Min guard;Rolling walker General ADL Comments: Pt closing his eyes throughout session.      Vision   Vision Assessment?: No apparent visual deficits   Perception     Praxis      Cognition Arousal/Alertness: Awake/alert Behavior During  Therapy: WFL for tasks assessed/performed Overall Cognitive Status: Within Functional Limits for tasks assessed                                          Exercises     Shoulder Instructions       General Comments Wife present during session and answering majority of questions for pt.     Pertinent Vitals/ Pain       Pain  Assessment: Faces Faces Pain Scale: Hurts little more Pain Location: generalized Pain Descriptors / Indicators: Discomfort Pain Intervention(s): Repositioned;Monitored during session  Home Living                                          Prior Functioning/Environment              Frequency  Min 2X/week        Progress Toward Goals  OT Goals(current goals can now be found in the care plan section)  Progress towards OT goals: Progressing toward goals  Acute Rehab OT Goals Patient Stated Goal: Better breathing and home OT Goal Formulation: With patient Time For Goal Achievement: 08/03/17 Potential to Achieve Goals: Good  Plan Discharge plan remains appropriate    Co-evaluation                 AM-PAC PT "6 Clicks" Daily Activity     Outcome Measure   Help from another person eating meals?: A Little Help from another person taking care of personal grooming?: A Little Help from another person toileting, which includes using toliet, bedpan, or urinal?: A Little Help from another person bathing (including washing, rinsing, drying)?: A Little Help from another person to put on and taking off regular upper body clothing?: A Little Help from another person to put on and taking off regular lower body clothing?: A Little 6 Click Score: 18    End of Session Equipment Utilized During Treatment: Rolling walker;Gait belt;Oxygen(3L O2)  OT Visit Diagnosis: Unsteadiness on feet (R26.81);Other abnormalities of gait and mobility (R26.89);Muscle weakness (generalized) (M62.81)   Activity Tolerance Patient tolerated treatment well   Patient Left in chair;with call bell/phone within reach;with family/visitor present   Nurse Communication Mobility status        Time: 9735-3299 OT Time Calculation (min): 23 min  Charges: OT General Charges $OT Visit: 1 Visit OT Treatments $Self Care/Home Management : 23-37 mins  Norman Herrlich, MS OTR/L  Pager:  [place] A Shonna Deiter 07/24/2017, 11:39 AM

## 2017-07-25 DIAGNOSIS — R531 Weakness: Secondary | ICD-10-CM

## 2017-07-25 LAB — BASIC METABOLIC PANEL
ANION GAP: 11 (ref 5–15)
BUN: 41 mg/dL — ABNORMAL HIGH (ref 6–20)
CHLORIDE: 84 mmol/L — AB (ref 101–111)
CO2: 33 mmol/L — AB (ref 22–32)
Calcium: 8.9 mg/dL (ref 8.9–10.3)
Creatinine, Ser: 1.89 mg/dL — ABNORMAL HIGH (ref 0.61–1.24)
GFR calc non Af Amer: 35 mL/min — ABNORMAL LOW (ref >60)
GFR, EST AFRICAN AMERICAN: 41 mL/min — AB (ref >60)
GLUCOSE: 199 mg/dL — AB (ref 65–99)
POTASSIUM: 4.5 mmol/L (ref 3.5–5.1)
SODIUM: 128 mmol/L — AB (ref 135–145)

## 2017-07-25 LAB — GLUCOSE, CAPILLARY
GLUCOSE-CAPILLARY: 209 mg/dL — AB (ref 65–99)
GLUCOSE-CAPILLARY: 279 mg/dL — AB (ref 65–99)
Glucose-Capillary: 202 mg/dL — ABNORMAL HIGH (ref 65–99)

## 2017-07-25 MED ORDER — AMLODIPINE BESYLATE 5 MG PO TABS
2.5000 mg | ORAL_TABLET | Freq: Every day | ORAL | Status: DC
  Administered 2017-07-26 – 2017-07-30 (×4): 2.5 mg via ORAL
  Filled 2017-07-25 (×7): qty 1

## 2017-07-25 NOTE — Progress Notes (Signed)
1155 Pt unable to work with PT. Just able to get to chair. We will continue to follow pt. Graylon Good RN BSN 07/25/2017 11:52 AM

## 2017-07-25 NOTE — Progress Notes (Signed)
Physical Therapy Treatment Patient Details Name: Jose [name] MRN: [id number] DOB: 10/30/1950 Today's Date: 07/25/2017    History of Present Illness Pt is a 66 y.o. male who presented to [facility] on 07/14/17 with chest pain and dyspnea. On arrival to [facility] ED, his EKG showed new onset A-fib with ST elevation in the inferolateral leads; CODE STEMI was called. Cardiac cath 11/15 with unsuccessful attempt at revascularization of occluded distal LAD, chronic occlusion of RCA. Pertinent PMH includes CAD (CTO of RCA, with residual disease in LAD/Lcx), COPD, HL, HTN, obesity,DMand tobacco use.    PT Comments    Pt presenting with increase fatigue and generalized weakness this session. Minimal verbal interaction with PT; suspect fatigue due to medication (?). Dependent on ADLs for changing socks and gown (spilled coffee due to hand weakness/shakiness). Pt required minA to stand pivot from chair to bed, demonstrating poor balance, BLE/BUE instability, and poor eccentric control into sitting. Discussed with pt and wife our current recs for HHPT, but may need to consider SNF-level therapies at d/c if pt does not continue to approve; they understand. Will continue to follow acutely and update recs if needed.   Follow Up Recommendations  Home health PT;Supervision for mobility/OOB     Equipment Recommendations  None recommended by PT    Recommendations for Other Services       Precautions / Restrictions Precautions Precautions: Fall Restrictions Weight Bearing Restrictions: No    Mobility  Bed Mobility Overal bed mobility: Needs Assistance             General bed mobility comments: Received sitting EOB with spilt coffee on gown and sheets; dependent for changing of socks and gown  Transfers Overall transfer level: Needs assistance Equipment used: Rolling walker (2 wheeled) Transfers: Sit to/from [facility] Sit to Stand: Min guard Stand pivot transfers: Min assist        General transfer comment: Pt stood x2 with RW, relying on momentum to power into standing ; after first stand, sat back on bed with poor eccentric control. Stood again to pivot to chair with bilat HHA and minA for balance.   Ambulation/Gait             General Gait Details: Deferred secondary to safety concerns; increased BUE and BLE weakness, with intermittent buckling and unanncounced sitting   Stairs            Wheelchair Mobility    Modified Rankin (Stroke Patients Only)       Balance Overall balance assessment: Needs assistance Sitting-balance support: Feet supported;Bilateral upper extremity supported Sitting balance-Leahy Scale: Fair Sitting balance - Comments: Intermittent modA to correct sitting balance, as pt's hands sliding off bed, requiring assist to reset UEs in safe position to prevent falling off bed     Standing balance-Leahy Scale: Poor                              Cognition Arousal/Alertness: Awake/alert Behavior During Therapy: WFL for tasks assessed/performed Overall Cognitive Status: Impaired/Different from baseline Area of Impairment: Attention;Awareness;Problem solving                   Current Attention Level: Selective       Awareness: Emergent Problem Solving: Requires verbal cues General Comments: Pt slower to respond this session and wife reports he has increased confusion, suspect due to meds (?). Requiring intermittent cues to keep eyes open during session and cues  for sequencing throughout      Exercises      General Comments General comments (skin integrity, edema, etc.): Wife present during session and speaking for pt throughout      Pertinent Vitals/Pain Pain Assessment: Faces Faces Pain Scale: Hurts a little bit Pain Location: generalized Pain Descriptors / Indicators: Discomfort Pain Intervention(s): Monitored during session;Repositioned    Home Living                      Prior  Function            PT Goals (current goals can now be found in the care plan section) Acute Rehab PT Goals Patient Stated Goal: Better breathing and home PT Goal Formulation: With patient Time For Goal Achievement: 08/01/17 Potential to Achieve Goals: Good Progress towards PT goals: Progressing toward goals    Frequency    Min 3X/week      PT Plan Current plan remains appropriate    Co-evaluation              AM-PAC PT "6 Clicks" Daily Activity  Outcome Measure  Difficulty turning over in bed (including adjusting bedclothes, sheets and blankets)?: Unable Difficulty moving from lying on back to sitting on the side of the bed? : Unable Difficulty sitting down on and standing up from a chair with arms (e.g., wheelchair, bedside commode, etc,.)?: Unable Help needed moving to and from a bed to chair (including a wheelchair)?: A Little Help needed walking in hospital room?: A Lot Help needed climbing 3-5 steps with a railing? : A Lot 6 Click Score: 10    End of Session Equipment Utilized During Treatment: Gait belt Activity Tolerance: Patient limited by fatigue Patient left: in chair;with call bell/phone within reach;with family/visitor present Nurse Communication: Mobility status;Other (comment)(Need for linen change; increased assist) PT Visit Diagnosis: Other abnormalities of gait and mobility (R26.89)     Time: 8144-8185 PT Time Calculation (min) (ACUTE ONLY): 25 min  Charges:  $Therapeutic Activity: 23-37 mins                    G Codes:      Mabeline Caras, PT, DPT Acute Rehab Services  Pager: [place] 07/25/2017, 9:11 AM

## 2017-07-25 NOTE — Progress Notes (Signed)
Inpatient Diabetes Program Recommendations  AACE/ADA: New Consensus Statement on Inpatient Glycemic Control (2015)  Target Ranges:  Prepandial:   less than 140 mg/dL      Peak postprandial:   less than 180 mg/dL (1-2 hours)      Critically ill patients:  140 - 180 mg/dL   Lab Results  Component Value Date   GLUCAP 202 (H) 07/25/2017   HGBA1C 7.9 (H) 07/15/2017    Review of Glycemic Control Results for Jose [name], Jose [name] (MRN [id number]) as of 07/25/2017 11:06  Ref. Range 07/24/2017 06:32 07/24/2017 11:26 07/24/2017 16:28 07/24/2017 20:53 07/25/2017 06:10  Glucose-Capillary Latest Ref Range: 65 - 99 mg/dL 235 (H) 246 (H) 292 (H) 313 (H) 202 (H)   Diabetes history: DM2  Outpatient Diabetes medications: Lantus 45 units QAM, Lantus 60 units QPM, Novolog 30 units TID with meals, Glipizide 5 mg BID, Metformin 1000 mg BID Current orders for Inpatient glycemic control: Lantus 30 units bid + Novolog 0-9 units TID with meals, Novolog 0-5 units QHS  Inpatient Diabetes Program Recommendations:  - Meal Coverage: Please consider ordering Novolog 5 units TID with meals for meal coverage if patient eats at least 50% of meals.  Thank you, Nani Gasser. Haden Suder, RN, MSN, CDE  Diabetes Coordinator Inpatient Glycemic Control Team Team Pager [phone] (8am-5pm) 07/25/2017 11:08 AM

## 2017-07-25 NOTE — Progress Notes (Signed)
Pt reports unable to tolerate una boots. Requested RN remove. RN removed and will continue to monitor.

## 2017-07-25 NOTE — Care Management Important Message (Signed)
Important Message  Patient Details  Name: Jose [name] MRN: [id number] Date of Birth: 07/20/1951   Medicare Important Message Given:  Yes    [name] Abena 07/25/2017, 10:01 AM

## 2017-07-25 NOTE — Progress Notes (Signed)
PROGRESS NOTE    Jose [name]  FMB:[id number] DOB: 10-01-1950 DOA: 07/14/2017 PCP: Cleophas Dunker, MD    Brief Narrative:  66 year old male who presents with chest pain and dyspnea. He is known to have coronary artery disease, COPD, dyslipidemia, type 2 diabetes mellitus,hypertension, obesity and tobacco abuse. He had chest pain intermittently for last 3 days prior to hospitalization, associated with dyspnea on exertion. Due to his persistent symptoms he was brought to the hospital, he was found in atrial fibrillation and his electrocardiogram showed ST elevations in the inferior lateral leads, his troponin was 20, and he was immediately taken to the cardiac catheterization lab. On his initial physical examination blood pressure 115/57, heart rate 52, temperature 98.9, oxygen saturation 93% his lungs were clear to auscultation bilaterally heart S1-S2 present, irregularly irregular no S3 or S4 gallop, no murmurs, his abdomen was soft nontender, 1+ lower extremity edema  Cardiac catheterization he was found to have total occlusion of the mid and apical LAD, he underwent Unsuccessful PCI to the LAD. He also was found to have chronic occlusion of the RCA, ejection fraction is 40%.   He developed acute kidney injury. He was placed on amiodarone and started on aggressive medical therapy for his coronary artery disease. Likely not a good surgical candidate.   Assessment & Plan:   Principal Problem:   STEMI (ST elevation myocardial infarction) ([place]) Active Problems:   COPD (chronic obstructive pulmonary disease) (HCC)   HTN (hypertension)   Diabetes (HCC)   Morbid obesity (HCC)   OSA (obstructive sleep apnea)   Chronic diastolic CHF (congestive heart failure) (HCC)   Atrial fibrillation with rapid ventricular response (HCC)   STEMI involving left anterior descending coronary artery (HCC)   Acute on chronic systolic CHF (congestive heart failure), NYHA class 3 ([place])   Coronary artery disease  involving native coronary artery of native heart with unstable angina pectoris (HCC)   PAF (paroxysmal atrial fibrillation) ([place])  1. Coronary disease status post ST elevation myocardial infarction.On medical therapy with aspirin -clopidogrel, metoprolol, hydralazine andisosorbide. Blood pressure 701 to 779 systolic. Patient having orthostatic symptoms.   2. Atrial fibrillation, new onset.On Amiodarone and metoprolol for rate control, will continue anticoagulation with apixaban.   3. Systolic heart failure, acute on chronic,LV EF 40%, with akinesis of the distal inferior-septal wall and apex.Improved lower extremity edema, patient's weight is 3 kg lower over last 24 hours, noted generalized weakness and orthostatic symptoms, plus elevated cr. Will plan to hold on diuresis for today. Continue heart failure management with metoprolol, no ace inh due to unstable renal function.  4. Acute kidney injury chronic kidney disease stage III.Worsening renal function with serum cr at 1,89 with K at 4,5, will plan to hold on diuresis for now, follow renal panel in am. Patient has lost 3 kg over last 24 hours. Not documented urine output.  5. Type 2 diabetes mellitus.Capillary glucose 246, 292, 313, 202, 209. Continue 30 units  glargine plus sliding scale for glucose cover and monitoring. Patient deconditioned with poor oral intake.  6. HTN.Continue with metoprolol, hydralazine and isosorbide. Blood pressure 390 to 300 systolic.   6. COPD.No cl;inical signs of acuteexacerbation. On albuteroland tiotropium.  DVT prophylaxis:apixaban Code Status:full Family Communication:I spoke with patient's family at the bedside and all questions were addressed. Disposition Plan:Home   Consultants:    Procedures:  Cardiac catheterization    Subjective: Patient feeling very weak and deconditioned, positive orthostatic symptoms, no nausea or vomiting, but poor oral intake, improved  lower extremity edema.   Objective: Vitals:   07/24/17 1627 07/24/17 1940 07/24/17 2217 07/25/17 0620  BP: (!) 143/69 (!) 130/42  (!) 119/58  Pulse: 61 82  61  Resp: 10 19  17   Temp: 98.6 F (37 C) 98.5 F (36.9 C)  97.9 F (36.6 C)  TempSrc: Oral Oral  Oral  SpO2: 97% 92% 92% 93%  Weight:    127.5 kg (281 lb 1.6 oz)  Height:        Intake/Output Summary (Last 24 hours) at 07/25/2017 0838 Last data filed at 07/24/2017 1900 Gross per 24 hour  Intake 720 ml  Output -  Net 720 ml   Filed Weights   07/23/17 0522 07/24/17 0359 07/25/17 0620  Weight: 131.7 kg (290 lb 6.4 oz) 130.7 kg (288 lb 3.2 oz) 127.5 kg (281 lb 1.6 oz)    Examination:   General: Not in pain or dyspnea, deconditioned Neurology: somnolent non focal  E ENT: mild pallor, no icterus, oral mucosa moist Cardiovascular: No JVD. S1-S2 present, rhythmic, no gallops, rubs, or murmurs. ++/ +++ pitting lower extremity edema. Pulmonary: decreased breath sounds bilaterally at bases due to poor inspiratory effort, decreased air movement, no wheezing, rhonchi or rales. Gastrointestinal. Abdomen flat, no organomegaly, non tender, no rebound or guarding Skin. No rashes Musculoskeletal: no joint deformities     Data Reviewed: I have personally reviewed following labs and imaging studies  CBC: Recent Labs  Lab 07/19/17 0221 07/20/17 0602  WBC 8.4 10.0  NEUTROABS  --  7.8*  HGB 10.7* 11.1*  HCT 32.7* 35.3*  MCV 86.7 88.9  PLT 276 956   Basic Metabolic Panel: Recent Labs  Lab 07/21/17 0224 07/22/17 0204 07/23/17 0254 07/24/17 0205 07/25/17 0220  NA 132* 131* 129* 126* 128*  K 4.3 4.5 4.0 4.2 4.5  CL 91* 88* 85* 80* 84*  CO2 31 32 34* 34* 33*  GLUCOSE 197* 347* 199* 293* 199*  BUN 33* 37* 32* 36* 41*  CREATININE 1.44* 1.76* 1.60* 1.70* 1.89*  CALCIUM 8.8* 8.9 9.0 9.1 8.9  MG  --   --   --  1.8  --    GFR: Estimated Creatinine Clearance: 53.8 mL/min (A) (by C-G formula based on SCr of 1.89 mg/dL  (H)). Liver Function Tests: No results for input(s): AST, ALT, ALKPHOS, BILITOT, PROT, ALBUMIN in the last 168 hours. No results for input(s): LIPASE, AMYLASE in the last 168 hours. No results for input(s): AMMONIA in the last 168 hours. Coagulation Profile: No results for input(s): INR, PROTIME in the last 168 hours. Cardiac Enzymes: Recent Labs  Lab 07/24/17 0952  CKTOTAL 124   BNP (last 3 results) No results for input(s): PROBNP in the last 8760 hours. HbA1C: No results for input(s): HGBA1C in the last 72 hours. CBG: Recent Labs  Lab 07/24/17 0632 07/24/17 1126 07/24/17 1628 07/24/17 2053 07/25/17 0610  GLUCAP 235* 246* 292* 313* 202*   Lipid Profile: No results for input(s): CHOL, HDL, LDLCALC, TRIG, CHOLHDL, LDLDIRECT in the last 72 hours. Thyroid Function Tests: No results for input(s): TSH, T4TOTAL, FREET4, T3FREE, THYROIDAB in the last 72 hours. Anemia Panel: No results for input(s): VITAMINB12, FOLATE, FERRITIN, TIBC, IRON, RETICCTPCT in the last 72 hours.    Radiology Studies: I have reviewed all of the imaging during this hospital visit personally     Scheduled Meds: . amiodarone  200 mg Oral BID  . amLODipine  5 mg Oral Daily  . apixaban  5 mg Oral BID  .  aspirin EC  81 mg Oral Daily  . chlorhexidine  15 mL Mouth Rinse BID  . clopidogrel  75 mg Oral Daily  . ferrous sulfate  325 mg Oral Q breakfast  . hydrALAZINE  50 mg Oral Q8H  . Influenza vac split quadrivalent PF  0.5 mL Intramuscular Tomorrow-1000  . insulin aspart  0-5 Units Subcutaneous QHS  . insulin aspart  0-9 Units Subcutaneous TID WC  . insulin glargine  30 Units Subcutaneous BID  . isosorbide mononitrate  30 mg Oral Daily  . magnesium oxide  400 mg Oral Daily  . mouth rinse  15 mL Mouth Rinse q12n4p  . metoprolol succinate  25 mg Oral BID  . omega-3 acid ethyl esters  2 g Oral Q breakfast  . omega-3 acid ethyl esters  3 g Oral BID AC  . polyethylene glycol  17 g Oral BID  .  potassium chloride  20 mEq Oral BID  . sertraline  100 mg Oral Daily  . tiotropium  18 mcg Inhalation QHS   Continuous Infusions:   LOS: 11 days        Mallary Kreger Gerome Apley, MD [facility] Pager [phone]

## 2017-07-25 NOTE — Progress Notes (Addendum)
Progress Note  Patient Name: Jose [name] Date of Encounter: 07/25/2017  Primary Cardiologist: Dr. Harl Bowie  Subjective   Mostly bothered by leg weakness and and tremor.  Inpatient Medications    Scheduled Meds: . amiodarone  200 mg Oral BID  . amLODipine  5 mg Oral Daily  . apixaban  5 mg Oral BID  . aspirin EC  81 mg Oral Daily  . chlorhexidine  15 mL Mouth Rinse BID  . clopidogrel  75 mg Oral Daily  . ferrous sulfate  325 mg Oral Q breakfast  . hydrALAZINE  50 mg Oral Q8H  . Influenza vac split quadrivalent PF  0.5 mL Intramuscular Tomorrow-1000  . insulin aspart  0-5 Units Subcutaneous QHS  . insulin aspart  0-9 Units Subcutaneous TID WC  . insulin glargine  30 Units Subcutaneous BID  . isosorbide mononitrate  30 mg Oral Daily  . magnesium oxide  400 mg Oral Daily  . mouth rinse  15 mL Mouth Rinse q12n4p  . metoprolol succinate  25 mg Oral BID  . omega-3 acid ethyl esters  2 g Oral Q breakfast  . omega-3 acid ethyl esters  3 g Oral BID AC  . polyethylene glycol  17 g Oral BID  . potassium chloride  20 mEq Oral BID  . sertraline  100 mg Oral Daily  . tiotropium  18 mcg Inhalation QHS   Continuous Infusions:  PRN Meds: acetaminophen, albuterol, morphine injection, nitroGLYCERIN, ondansetron (ZOFRAN) IV, oxyCODONE-acetaminophen   Vital Signs    Vitals:   07/24/17 1940 07/24/17 2217 07/25/17 0620 07/25/17 0846  BP: (!) 130/42  (!) 119/58 (!) 121/51  Pulse: 82  61 60  Resp: 19  17   Temp: 98.5 F (36.9 C)  97.9 F (36.6 C)   TempSrc: Oral  Oral   SpO2: 92% 92% 93%   Weight:   281 lb 1.6 oz (127.5 kg)   Height:        Intake/Output Summary (Last 24 hours) at 07/25/2017 1037 Last data filed at 07/25/2017 0800 Gross per 24 hour  Intake 720 ml  Output -  Net 720 ml   Filed Weights   07/23/17 0522 07/24/17 0359 07/25/17 0620  Weight: 290 lb 6.4 oz (131.7 kg) 288 lb 3.2 oz (130.7 kg) 281 lb 1.6 oz (127.5 kg)    Telemetry    Normal sinus rhythm -  Personally Reviewed  ECG    None recently  Physical Exam   GEN: No acute distress.   Neck: No JVD Cardiac: RRR, no murmurs, rubs, or gallops.  Respiratory: Clear to auscultation bilaterally. GI: Soft, nontender, non-distended , obese MS:  Bilateral lower extremity edema; No deformity. Neuro:  Nonfocal  Psych: Normal affect   Labs    Chemistry Recent Labs  Lab 07/23/17 0254 07/24/17 0205 07/25/17 0220  NA 129* 126* 128*  K 4.0 4.2 4.5  CL 85* 80* 84*  CO2 34* 34* 33*  GLUCOSE 199* 293* 199*  BUN 32* 36* 41*  CREATININE 1.60* 1.70* 1.89*  CALCIUM 9.0 9.1 8.9  GFRNONAA 43* 40* 35*  GFRAA 50* 47* 41*  ANIONGAP 10 12 11      Hematology Recent Labs  Lab 07/19/17 0221 07/20/17 0602  WBC 8.4 10.0  RBC 3.77* 3.97*  HGB 10.7* 11.1*  HCT 32.7* 35.3*  MCV 86.7 88.9  MCH 28.4 28.0  MCHC 32.7 31.4  RDW 15.0 14.8  PLT 276 283    Cardiac EnzymesNo results for input(s): TROPONINI in  the last 168 hours. No results for input(s): TROPIPOC in the last 168 hours.   BNPNo results for input(s): BNP, PROBNP in the last 168 hours.   DDimer No results for input(s): DDIMER in the last 168 hours.   Radiology    Dg Chest 2 View  Result Date: 07/23/2017 CLINICAL DATA:  Pleuritic chest pain. EXAM: CHEST  2 VIEW COMPARISON:  07/17/2017 FINDINGS: Stable enlargement of the cardiac silhouette. Lungs are clear without airspace disease or pulmonary edema. Improved aeration in the right lower lung compared to the recent comparison. Trachea is midline. Negative for a pneumothorax. Bony thorax is intact. No large pleural effusions. Bridging osteophytes in the thoracic spine. IMPRESSION: No active cardiopulmonary disease. Stable cardiomegaly. Electronically Signed   By: Markus Daft M.D.   On: 07/23/2017 12:30    Cardiac Studies     Patient Profile     66 y.o. male MI in the setting of atrial fibrillation with rapid ventricular response.  Multivessel coronary disease, with unsuccessful  angioplasty of the distal LAD.  Ejection fraction 40% in November 2018.  CAD/MI no angina at this time.  Assessment & Plan    1) CAD/MI no management at this time.  Chronic systolic heart failure: Recent.  He still has some hyponatremia.  Stable.  1 month of dual antiplatelet therapy as planned..      Chronic systolic heart failure: Continue diuresis.  He still has some hyponatremia.  Will need to follow renal function closely as well.    Weakness: Appreciate internal medicine assistance.  Statin has been stopped.  Still has some tremor but this is better.  It is not entirely clear to me that the statin was intact causing tremor.    Atrial fibrillation: Being loaded with amiodarone to maintain sinus rhythm.  Eliquis for stroke prevention.  HTN: Decrease amlodipine as he has had low blood pressures in the past causing problems.  Blood pressure well controlled this morning.        For questions or updates, please contact [place] Please consult [email or web address] for contact info under Cardiology/STEMI.      Signed, Larae Grooms, MD  07/25/2017, 10:37 AM

## 2017-07-26 DIAGNOSIS — N183 Chronic kidney disease, stage 3 unspecified: Secondary | ICD-10-CM

## 2017-07-26 LAB — BASIC METABOLIC PANEL
Anion gap: 11 (ref 5–15)
BUN: 43 mg/dL — ABNORMAL HIGH (ref 6–20)
CHLORIDE: 83 mmol/L — AB (ref 101–111)
CO2: 31 mmol/L (ref 22–32)
Calcium: 8.7 mg/dL — ABNORMAL LOW (ref 8.9–10.3)
Creatinine, Ser: 1.93 mg/dL — ABNORMAL HIGH (ref 0.61–1.24)
GFR calc non Af Amer: 35 mL/min — ABNORMAL LOW (ref >60)
GFR, EST AFRICAN AMERICAN: 40 mL/min — AB (ref >60)
Glucose, Bld: 284 mg/dL — ABNORMAL HIGH (ref 65–99)
POTASSIUM: 4.3 mmol/L (ref 3.5–5.1)
SODIUM: 125 mmol/L — AB (ref 135–145)

## 2017-07-26 LAB — GLUCOSE, CAPILLARY
GLUCOSE-CAPILLARY: 338 mg/dL — AB (ref 65–99)
GLUCOSE-CAPILLARY: 275 mg/dL — AB (ref 65–99)
GLUCOSE-CAPILLARY: 301 mg/dL — AB (ref 65–99)
GLUCOSE-CAPILLARY: 301 mg/dL — AB (ref 65–99)
GLUCOSE-CAPILLARY: 402 mg/dL — AB (ref 65–99)

## 2017-07-26 LAB — GLUCOSE, RANDOM: Glucose, Bld: 283 mg/dL — ABNORMAL HIGH (ref 65–99)

## 2017-07-26 MED ORDER — INSULIN ASPART 100 UNIT/ML ~~LOC~~ SOLN
8.0000 [IU] | Freq: Three times a day (TID) | SUBCUTANEOUS | Status: DC
  Administered 2017-07-26 – 2017-08-09 (×33): 8 [IU] via SUBCUTANEOUS

## 2017-07-26 MED ORDER — METOPROLOL SUCCINATE ER 25 MG PO TB24
12.5000 mg | ORAL_TABLET | Freq: Two times a day (BID) | ORAL | Status: DC
  Administered 2017-07-26 – 2017-08-03 (×14): 12.5 mg via ORAL
  Filled 2017-07-26 (×16): qty 1

## 2017-07-26 MED ORDER — FUROSEMIDE 10 MG/ML IJ SOLN
80.0000 mg | Freq: Once | INTRAMUSCULAR | Status: DC
  Filled 2017-07-26: qty 8

## 2017-07-26 MED ORDER — INSULIN ASPART 100 UNIT/ML ~~LOC~~ SOLN
7.0000 [IU] | Freq: Once | SUBCUTANEOUS | Status: AC
  Administered 2017-07-26: 7 [IU] via SUBCUTANEOUS

## 2017-07-26 MED ORDER — INSULIN GLARGINE 100 UNIT/ML ~~LOC~~ SOLN
40.0000 [IU] | Freq: Two times a day (BID) | SUBCUTANEOUS | Status: DC
  Administered 2017-07-26 – 2017-08-09 (×27): 40 [IU] via SUBCUTANEOUS
  Filled 2017-07-26 (×31): qty 0.4

## 2017-07-26 NOTE — Progress Notes (Signed)
Pt's wife called RN, reports pt is having a "weakness spell again". Upon entering room, pt pale, unable to hold himself in a sitting position without assistance. Vitals obtained, similar to previous vitals just obtained. Pt very weak. Pt helped into bed, placed O2 via [place]. Pt reports feeling "very tired". Call bell and phone within reach. Wife at bedside. Will continue to monitor.

## 2017-07-26 NOTE — Progress Notes (Signed)
PROGRESS NOTE    Jose [name]  MWU:[id number] DOB: 03-29-1951 DOA: 07/14/2017 PCP: Cleophas Dunker, MD    Brief Narrative:  66 year old male who presents with chest pain and dyspnea. He is known to have coronary artery disease, COPD, dyslipidemia, type 2 diabetes mellitus,hypertension, obesity and tobacco abuse. He had chest pain intermittently for last 3 days prior to hospitalization, associated with dyspnea on exertion. Due to his persistent symptoms he was brought to the hospital, he was found in atrial fibrillation and his electrocardiogram showed ST elevations in the inferior lateral leads, his troponin was 20, and he was immediately taken to the cardiac catheterization lab. On his initial physical examination blood pressure 115/57, heart rate 52, temperature 98.9, oxygen saturation 93% his lungs were clear to auscultation bilaterally heart S1-S2 present, irregularly irregular no S3 or S4 gallop, no murmurs, his abdomen was soft nontender, 1+ lower extremity edema  Cardiac catheterization he was found to have total occlusion of the mid and apical LAD, he underwent Unsuccessful PCI to the LAD. He also was found to have chronic occlusion of the RCA, ejection fraction is 40%.   He developed acute kidney injury. He was placed on amiodarone and started on aggressive medical therapy for his coronary artery disease. Likely not a good surgical candidate.  Patient developed AKI due to over-diuresis.    Assessment & Plan:   Principal Problem:   STEMI (ST elevation myocardial infarction) ([place]) Active Problems:   COPD (chronic obstructive pulmonary disease) (HCC)   HTN (hypertension)   Diabetes (HCC)   Morbid obesity (HCC)   OSA (obstructive sleep apnea)   Chronic diastolic CHF (congestive heart failure) (HCC)   Atrial fibrillation with rapid ventricular response (HCC)   STEMI involving left anterior descending coronary artery (HCC)   Acute on chronic systolic CHF (congestive heart  failure), NYHA class 3 ([place])   Coronary artery disease involving native coronary artery of native heart with unstable angina pectoris (HCC)   PAF (paroxysmal atrial fibrillation) ([place])   1. Coronary disease status post ST elevation myocardial infarction.Continue medical therapy with dual antiplatelet therapy with aspirin -clopidogrel, b blockade with metoprolol, and after load reduction with hydralazine andisosorbide. Systolic blood pressure 725 to 115.   2. Atrial fibrillation, new onset.Continue Amiodarone and metoprolol. Resting heart rate 50 to 60, anticoagulation with apixaban.   3. Systolic heart failure, acute on chronic,LV EF 40%, with akinesis of the distal inferior-septal wall and apex.Coninue medical management with B blockade and after load reduction with isosorbide and hydralazine. Patient has a net negative -7,059 ml fluid balance since admission with improvement of his symptoms. To consider resume oral diuretic therapy in 24 hours. Continue fluid and salt restriction, strict in and out and daily weight.   4. Acute kidney injury on chronic kidney disease stage III.Stable renal function with serum cr at 1,89 and 1,93, will recommend continue to hold on diuresis for the next 24 hours and resume oral diuretics if renal function continue to be stable in am. Noted K at 4,3 and serum bicarbonate at 31. Hyponatremia likely more due to aggressive diuresis than from hypervolemia.    5. Type 2 diabetes mellitus.Continue elevation of capillary glucose 279, 338, 275, 301, 310, will increase basal insulin to 40 units bid and add 8 units of aspart with meals. Continue sliding scale insulin for glucose cover and monitoring.   6. HTN.On metoprolol, hydralazine and isosorbide.   6. COPD.No acuteexacerbation. On albuterolandtiotropium. For bronchodilator therapy.   DVT prophylaxis:apixaban Code Status:full Family  Communication:I spoke with patient's family at the bedside and  all questions were addressed. Disposition Plan:Home   Consultants:    Procedures:  Cardiac catheterization   Subjective: This am patient is feeling better, had intermittent episodes of somnolence and confusion, episodes have become more brief but still recurrent  Objective: Vitals:   07/26/17 0600 07/26/17 0700 07/26/17 0708 07/26/17 0734  BP:   (!) 115/56 (!) 115/92  Pulse: (!) 58 68  (!) 59  Resp: 17 19  12   Temp:      TempSrc:      SpO2:      Weight:      Height:        Intake/Output Summary (Last 24 hours) at 07/26/2017 0927 Last data filed at 07/26/2017 0700 Gross per 24 hour  Intake 1020 ml  Output 700 ml  Net 320 ml   Filed Weights   07/24/17 0359 07/25/17 0620 07/26/17 0532  Weight: 130.7 kg (288 lb 3.2 oz) 127.5 kg (281 lb 1.6 oz) 128.5 kg (283 lb 4.8 oz)    Examination:   General: Not in pain or dyspnea, deconditioned Neurology: Awake and alert, non focal  E ENT: mild pallor, no icterus, oral mucosa moist Cardiovascular: No JVD. S1-S2 present, rhythmic, no gallops, rubs, or murmurs. ++ non pitting lower extremity edema. Pulmonary: mild decreased breath sounds bilaterally at bases, adequate air movement, no wheezing, rhonchi or rales. Gastrointestinal. Abdomen protuberant, no organomegaly, non tender, no rebound or guarding Skin. No rashes Musculoskeletal: no joint deformities     Data Reviewed: I have personally reviewed following labs and imaging studies  CBC: Recent Labs  Lab 07/20/17 0602  WBC 10.0  NEUTROABS 7.8*  HGB 11.1*  HCT 35.3*  MCV 88.9  PLT 734   Basic Metabolic Panel: Recent Labs  Lab 07/22/17 0204 07/23/17 0254 07/24/17 0205 07/25/17 0220 07/26/17 0213  NA 131* 129* 126* 128* 125*  K 4.5 4.0 4.2 4.5 4.3  CL 88* 85* 80* 84* 83*  CO2 32 34* 34* 33* 31  GLUCOSE 347* 199* 293* 199* 284*  BUN 37* 32* 36* 41* 43*  CREATININE 1.76* 1.60* 1.70* 1.89* 1.93*  CALCIUM 8.9 9.0 9.1 8.9 8.7*  MG  --   --  1.8  --    --    GFR: Estimated Creatinine Clearance: 52.9 mL/min (A) (by C-G formula based on SCr of 1.93 mg/dL (H)). Liver Function Tests: No results for input(s): AST, ALT, ALKPHOS, BILITOT, PROT, ALBUMIN in the last 168 hours. No results for input(s): LIPASE, AMYLASE in the last 168 hours. No results for input(s): AMMONIA in the last 168 hours. Coagulation Profile: No results for input(s): INR, PROTIME in the last 168 hours. Cardiac Enzymes: Recent Labs  Lab 07/24/17 0952  CKTOTAL 124   BNP (last 3 results) No results for input(s): PROBNP in the last 8760 hours. HbA1C: No results for input(s): HGBA1C in the last 72 hours. CBG: Recent Labs  Lab 07/24/17 2053 07/25/17 0610 07/25/17 1133 07/25/17 1613 07/26/17 0654  GLUCAP 313* 202* 209* 279* 275*   Lipid Profile: No results for input(s): CHOL, HDL, LDLCALC, TRIG, CHOLHDL, LDLDIRECT in the last 72 hours. Thyroid Function Tests: No results for input(s): TSH, T4TOTAL, FREET4, T3FREE, THYROIDAB in the last 72 hours. Anemia Panel: No results for input(s): VITAMINB12, FOLATE, FERRITIN, TIBC, IRON, RETICCTPCT in the last 72 hours.    Radiology Studies: I have reviewed all of the imaging during this hospital visit personally     Scheduled Meds: .  amiodarone  200 mg Oral BID  . amLODipine  2.5 mg Oral Daily  . apixaban  5 mg Oral BID  . aspirin EC  81 mg Oral Daily  . chlorhexidine  15 mL Mouth Rinse BID  . clopidogrel  75 mg Oral Daily  . ferrous sulfate  325 mg Oral Q breakfast  . hydrALAZINE  50 mg Oral Q8H  . Influenza vac split quadrivalent PF  0.5 mL Intramuscular Tomorrow-1000  . insulin aspart  0-5 Units Subcutaneous QHS  . insulin aspart  0-9 Units Subcutaneous TID WC  . insulin glargine  30 Units Subcutaneous BID  . isosorbide mononitrate  30 mg Oral Daily  . magnesium oxide  400 mg Oral Daily  . mouth rinse  15 mL Mouth Rinse q12n4p  . metoprolol succinate  25 mg Oral BID  . omega-3 acid ethyl esters  2 g Oral  Q breakfast  . omega-3 acid ethyl esters  3 g Oral BID AC  . polyethylene glycol  17 g Oral BID  . sertraline  100 mg Oral Daily  . tiotropium  18 mcg Inhalation QHS   Continuous Infusions:   LOS: 12 days        Mauricio Gerome Apley, MD [facility] Pager [phone]

## 2017-07-26 NOTE — Progress Notes (Addendum)
Covering provider notified [id number]; orders received.

## 2017-07-26 NOTE — Progress Notes (Signed)
RN called to pts room. Wife reports pt has a "weak spell" again. [place] stated pt was moving from chair to bed, when he fell onto the bed. Pt thinks he just "lost his balance". VSS. Pt denies pain. States "nothing is wrong. Call bell within reach, wife at bedside. Will continue to monitor.

## 2017-07-26 NOTE — Progress Notes (Signed)
Per cardiology, pt to receive 80mg  IV lasix. When RN went to administer to pt, pt and wife stated that the Hospitalist not give due to pts kidney function. After back and forth with both parties, no agreement was made. Options given to pt and his wife. At this time, they would like to defer the lasix and reassess tomorrow when both parties can discuss with each other. Pt denies SOB at this time. Will continue to monitor.

## 2017-07-26 NOTE — Progress Notes (Signed)
Inpatient Diabetes Program Recommendations  AACE/ADA: New Consensus Statement on Inpatient Glycemic Control (2015)  Target Ranges:  Prepandial:   less than 140 mg/dL      Peak postprandial:   less than 180 mg/dL (1-2 hours)      Critically ill patients:  140 - 180 mg/dL   Lab Results  Component Value Date   GLUCAP 301 (H) 07/26/2017   HGBA1C 7.9 (H) 07/15/2017    Review of Glycemic Control Results for Jose [name], Jose [name] (MRN [id number]) as of 07/26/2017 13:24  Ref. Range 07/25/2017 11:33 07/25/2017 16:13 07/25/2017 21:16 07/26/2017 06:54 07/26/2017 11:12  Glucose-Capillary Latest Ref Range: 65 - 99 mg/dL 209 (H) 279 (H) 338 (H) 275 (H) 301 (H)   Diabetes history:DM2 Outpatient Diabetes medications:Lantus 45 units QAM, Lantus 60 units QPM, Novolog 30 units TID with meals, Glipizide 5 mg BID, Metformin 1000 mg BID Current orders for Inpatient glycemic control:Lantus 30 units bid +Novolog 0-9 units TID with meals, Novolog 0-5 units QHS  Inpatient Diabetes Program Recommendations:  Noted hyperglycemia. Please consider: -Increase Lantus to 40 units qd -Add Novolog 8 units meal coverage tid if eats 50% Text page with recommendations sent to Dr. Cathlean Sauer.  Thank you, Nani Gasser. Mikia Delaluz, RN, MSN, CDE  Diabetes Coordinator Inpatient Glycemic Control Team Team Pager [phone] (8am-5pm) 07/26/2017 1:28 PM

## 2017-07-26 NOTE — Progress Notes (Signed)
Occupational Therapy Treatment Patient Details Name: DETRIC SCALISI MRN: [id number] DOB: [birth year] Today's Date: 07/26/2017    History of present illness Pt is a 66 y.o. male who presented to [facility] on 07/14/17 with chest pain and dyspnea. On arrival to [facility] ED, his EKG showed new onset A-fib with ST elevation in the inferolateral leads; CODE STEMI was called. Cardiac cath 11/15 with unsuccessful attempt at revascularization of occluded distal LAD, chronic occlusion of RCA. Pertinent PMH includes CAD (CTO of RCA, with residual disease in LAD/Lcx), COPD, HL, HTN, obesity,DMand tobacco use.    OT comments  Pt and family reporting intermittent periods of weakness over the past few days. Pt initially alert and interactive with functional cognition; however, as session went on he demonstrated decreased ability to follow commands and attend to tasks at hand. He required min assist for stand-pivot toilet transfers and adamantly declined further functional mobility as his lunch arrived. Noted pt with single episode of L UE decreased strength and unable to reach to target. Noted poorly coordinated movement in B UE and LE this session associated with this episode. Pt and family adamant about returning home although at current functional level, may need to consider short-term SNF.     Follow Up Recommendations  Home health OT;Supervision/Assistance - 24 hour    Equipment Recommendations  3 in 1 bedside commode    Recommendations for Other Services      Precautions / Restrictions Precautions Precautions: Fall Precaution Comments: New intermittent weakness Restrictions Weight Bearing Restrictions: No       Mobility Bed Mobility               General bed mobility comments: Sitting at EOB  Transfers Overall transfer level: Needs assistance Equipment used: Rolling walker (2 wheeled) Transfers: Sit to/from [facility] Sit to Stand: Min assist Stand pivot transfers: Min  assist       General transfer comment: Min assist throughout. Noted poor coordination when taking steps to EOB.     Balance Overall balance assessment: Needs assistance Sitting-balance support: Feet supported;Bilateral upper extremity supported Sitting balance-Leahy Scale: Fair     Standing balance support: Bilateral upper extremity supported;No upper extremity supported;During functional activity Standing balance-Leahy Scale: Poor Standing balance comment: Relied on RW support this session.                            ADL either performed or assessed with clinical judgement   ADL Overall ADL's : Needs assistance/impaired Eating/Feeding: Set up;Sitting Eating/Feeding Details (indicate cue type and reason): Difficulty per family during intermittent weakness episodes. Able to utilize utensils today but poorly coordinated.                      Toilet Transfer: Ambulation;RW;Minimal Print production planner Details (indicate cue type and reason): Simulated with sit<>stand followed by ambulation in room.            General ADL Comments: Pt intermittently following commands this session. Moments of clarity throughout but then would ask his wife "what is she asking me to do?"     Vision   Vision Assessment?: No apparent visual deficits   Perception     Praxis      Cognition Arousal/Alertness: Awake/alert Behavior During Therapy: WFL for tasks assessed/performed Overall Cognitive Status: Impaired/Different from baseline Area of Impairment: Attention;Following commands;Awareness;Problem solving  Current Attention Level: Selective       Awareness: Emergent Problem Solving: Requires verbal cues General Comments: Pt slower to respond this session and wife reports he has increased confusion, suspect due to meds (?). Requiring intermittent cues to keep eyes open during session and cues for sequencing throughout        Exercises      Shoulder Instructions       General Comments Wife present and engaged in session.     Pertinent Vitals/ Pain       Pain Assessment: Faces Faces Pain Scale: Hurts little more Pain Location: generalized Pain Descriptors / Indicators: Discomfort Pain Intervention(s): Repositioned;Monitored during session  Home Living                                          Prior Functioning/Environment              Frequency  Min 2X/week        Progress Toward Goals  OT Goals(current goals can now be found in the care plan section)  Progress towards OT goals: Progressing toward goals  Acute Rehab OT Goals Patient Stated Goal: Figure out what is causing these weak spells OT Goal Formulation: With patient Time For Goal Achievement: 08/03/17 Potential to Achieve Goals: Good  Plan Discharge plan remains appropriate    Co-evaluation                 AM-PAC PT "6 Clicks" Daily Activity     Outcome Measure   Help from another person eating meals?: A Little Help from another person taking care of personal grooming?: A Little Help from another person toileting, which includes using toliet, bedpan, or urinal?: A Little Help from another person bathing (including washing, rinsing, drying)?: A Little Help from another person to put on and taking off regular upper body clothing?: A Little Help from another person to put on and taking off regular lower body clothing?: A Little 6 Click Score: 18    End of Session Equipment Utilized During Treatment: Rolling walker;Gait belt;Oxygen  OT Visit Diagnosis: Unsteadiness on feet (R26.81);Other abnormalities of gait and mobility (R26.89);Muscle weakness (generalized) (M62.81)   Activity Tolerance Patient tolerated treatment well   Patient Left in chair;with call bell/phone within reach;with family/visitor present   Nurse Communication Mobility status        Time: 9767-3419 OT Time Calculation (min): 26  min  Charges: OT General Charges $OT Visit: 1 Visit OT Treatments $Self Care/Home Management : 23-37 mins  Norman Herrlich, MS OTR/L  Pager: [place] A Flora Parks 07/26/2017, 4:19 PM

## 2017-07-26 NOTE — Progress Notes (Signed)
Progress Note  Patient Name: Jose [name] Date of Encounter: 07/26/2017  Primary Cardiologist: Harl Bowie  Subjective   Patient and wife endorse continued episodes of weakness that start and stop very suddenly. Longest episode was yesterday for about 3 hrs, but mostly it is about 62mins long. Wife endorses that patient is fatigued after episodes subside. Patient denies chest pain, shortness of breath, palpitations. Episode of weakness observed during interview - patient endorsed just being foggy headed but denied chest pain, shortness of breath, double vision, numbness/tingling. He was able to perform neuro exam but appeared to fall asleep intermittently which would cause him to start leaning forward.   Inpatient Medications    Scheduled Meds: . amiodarone  200 mg Oral BID  . amLODipine  2.5 mg Oral Daily  . apixaban  5 mg Oral BID  . aspirin EC  81 mg Oral Daily  . chlorhexidine  15 mL Mouth Rinse BID  . clopidogrel  75 mg Oral Daily  . ferrous sulfate  325 mg Oral Q breakfast  . hydrALAZINE  50 mg Oral Q8H  . Influenza vac split quadrivalent PF  0.5 mL Intramuscular Tomorrow-1000  . insulin aspart  0-5 Units Subcutaneous QHS  . insulin aspart  0-9 Units Subcutaneous TID WC  . insulin glargine  30 Units Subcutaneous BID  . isosorbide mononitrate  30 mg Oral Daily  . magnesium oxide  400 mg Oral Daily  . mouth rinse  15 mL Mouth Rinse q12n4p  . metoprolol succinate  25 mg Oral BID  . omega-3 acid ethyl esters  2 g Oral Q breakfast  . omega-3 acid ethyl esters  3 g Oral BID AC  . polyethylene glycol  17 g Oral BID  . sertraline  100 mg Oral Daily  . tiotropium  18 mcg Inhalation QHS   Continuous Infusions:  PRN Meds: acetaminophen, albuterol, morphine injection, nitroGLYCERIN, ondansetron (ZOFRAN) IV, oxyCODONE-acetaminophen   Vital Signs    Vitals:   07/26/17 0600 07/26/17 0700 07/26/17 0708 07/26/17 0734  BP:   (!) 115/56 (!) 115/92  Pulse: (!) 58 68  (!) 59  Resp: 17  19  12   Temp:      TempSrc:      SpO2:      Weight:      Height:        Intake/Output Summary (Last 24 hours) at 07/26/2017 0854 Last data filed at 07/26/2017 0700 Gross per 24 hour  Intake 1020 ml  Output 700 ml  Net 320 ml   Filed Weights   07/24/17 0359 07/25/17 0620 07/26/17 0532  Weight: 288 lb 3.2 oz (130.7 kg) 281 lb 1.6 oz (127.5 kg) 283 lb 4.8 oz (128.5 kg)    Telemetry    Sinus rhythm- Personally Reviewed 06/2277  ECG    N/a - Personally Reviewed  Physical Exam   GEN: No acute distress, sitting up in bed.   Neck: hard to evaluate JVD due to obesity Cardiac: RRR, no murmurs, rubs, or gallops.  Respiratory: Clear to auscultation bilaterally, but very diminished breath sounds bilaterally GI: Soft, nontender; distended MS: 2+ pitting edema to thighs; 1+ pitting edema in sacrum Neuro: CN 2-12 intact, strength 4/5 throughout, able to follow commands - appears to fall asleep with occasional jerking  Psych: Normal affect   Labs    Chemistry Recent Labs  Lab 07/24/17 0205 07/25/17 0220 07/26/17 0213  NA 126* 128* 125*  K 4.2 4.5 4.3  CL 80* 84* 83*  CO2  34* 33* 31  GLUCOSE 293* 199* 284*  BUN 36* 41* 43*  CREATININE 1.70* 1.89* 1.93*  CALCIUM 9.1 8.9 8.7*  GFRNONAA 40* 35* 35*  GFRAA 47* 41* 40*  ANIONGAP 12 11 11      Hematology Recent Labs  Lab 07/20/17 0602  WBC 10.0  RBC 3.97*  HGB 11.1*  HCT 35.3*  MCV 88.9  MCH 28.0  MCHC 31.4  RDW 14.8  PLT 283    Cardiac Enzymes No results for input(s): TROPONINI in the last 168 hours.  No results for input(s): TROPIPOC in the last 168 hours.   BNP No results for input(s): BNP, PROBNP in the last 168 hours.   DDimer No results for input(s): DDIMER in the last 168 hours.   Radiology    No results found.  Cardiac Studies   Cardiac cath July 14, 2017  -unsuccessful attempted PTCA of occluded distal LAD, chronic occlusion of RCA Diagnostic Diagram       Post-Intervention Diagram          Echo July 14, 2017 - Left ventricle: LVEF is approximately 40% with akinesis of the distal inferoseptal wall and apex. Aneurysmal dilatation of apex. Consider limited echo with contrast to evaluate for thrombus. The cavity size was normal. Wall thickness was increased in a pattern of mild LVH. - Mitral valve: Calcified annulus. Mildly thickened leaflets .  Patient Profile     66 y.o. male presenting with extensive anteroapical and inferior infarction, unsuccessful attempt at revascularization of occluded distal LAD, chronic occlusion of RCA.   Moderate reduction in ventricular systolic function.   Atrial fibrillation with rapid ventricular response present on arrival, resolved with amiodarone.   Moderate acute renal failure related to contrast nephrotoxicity/retention, improving.    Assessment & Plan    CAD s/p STEMI:  Unsuccessful PCI to distal LAD occlusion and not optimal surgical candidate at this time; echo consistent with aneurysmal dilation of apex.  --asa 81mg  daily x 1 month, plavix 75mg  daily, atorvastatin 40mg  daily, lopresor 12.5mg  BID --Eliquis for LV dilation  Acute on chronic systolic CHF:  Ins/outs not accurately recorded; weight up today; still having significant LE and sacral edema but much improved from last week. --restart IV lasix 80mg  BID --holding on starting ACE-I until renal function stabilizes  AFib:  Maintaining sinus rhythm. Plan current dose of amiodarone 4 weeks (200mg  BID), then decrease to maintenance dose of 200 mg daily CHADSVasc 5 (age, hypertension, diabetes, heart failure, CAD). --amiodarone 200mg  BID --Eliquis   Episodes of weakness: Witnessed today in room; patient with no focal CN or other neuro deficits, just appears as if he is falling asleep - able to follow commands. BP checked during this time was 132/78, HR stable as before (upper 50s, low 60s), SR on tele. Does not have clear seizure like activity or stroke like  symptoms. He does have known L ICA stenosis and might be having symptoms from this, though this is less likely as his VS remain stable as when not symptomatic so he shouldn't have decreased blood flow. We could decrease metoprolol to 12.5mg  BID due to his lower HRs.  HTN: Amlodipine 2.5mg  daily, hydralazine 50mg  TID, metoprolol 25mg  BID. BP stable today.  Acute renal failure on CKD stage III:   Initial episode with etiology likely combo of contrast nephrotoxicity and urinary retention (now resolved, no foley needed). Renal function worsening over last couple of days with Cr now at 1.93 and hasn't gotten diuretics since 11/25. Would diurese.  OSA:  Encouraged patient to wear CPAP tonight.  DM:  IM assisting in DM management.  COPD/Smoking:  Stable. Strongly recommend permanent smoking cessation.  Hyponatremia: Worsening; has not had diuretics. --fluid restriction, diuresis  Dispo: HH face to face orders placed; on d/c need meds called in to [facility], [place] (phone # [phone] ex [phone]) and ask they be expedited so they will be over-nighted to patient.  For questions or updates, please contact [facility] Please consult [email or web address] for contact info under Cardiology/STEMI.   Signed, Alphonzo Grieve, MD  07/26/2017, 8:54 AM    I have examined the patient and reviewed assessment and plan and discussed with patient.  Agree with above as stated.  No cardiac symptoms.  Episodic weakness, unclear etiology.  Managed by internal medicine.  COnsider neurology consult.  It appears that Cr stabilizing.  Sodium worsening.  May improve with diuresis since there will be net loss of H2O.   Larae Grooms

## 2017-07-27 DIAGNOSIS — R0781 Pleurodynia: Secondary | ICD-10-CM

## 2017-07-27 LAB — BASIC METABOLIC PANEL
ANION GAP: 9 (ref 5–15)
BUN: 42 mg/dL — ABNORMAL HIGH (ref 6–20)
CALCIUM: 8.7 mg/dL — AB (ref 8.9–10.3)
CHLORIDE: 86 mmol/L — AB (ref 101–111)
CO2: 31 mmol/L (ref 22–32)
Creatinine, Ser: 1.77 mg/dL — ABNORMAL HIGH (ref 0.61–1.24)
GFR calc non Af Amer: 38 mL/min — ABNORMAL LOW (ref >60)
GFR, EST AFRICAN AMERICAN: 44 mL/min — AB (ref >60)
GLUCOSE: 173 mg/dL — AB (ref 65–99)
POTASSIUM: 4.1 mmol/L (ref 3.5–5.1)
Sodium: 126 mmol/L — ABNORMAL LOW (ref 135–145)

## 2017-07-27 LAB — GLUCOSE, CAPILLARY
GLUCOSE-CAPILLARY: 209 mg/dL — AB (ref 65–99)
GLUCOSE-CAPILLARY: 222 mg/dL — AB (ref 65–99)
GLUCOSE-CAPILLARY: 262 mg/dL — AB (ref 65–99)
Glucose-Capillary: 259 mg/dL — ABNORMAL HIGH (ref 65–99)

## 2017-07-27 LAB — CBC
HEMATOCRIT: 31.7 % — AB (ref 39.0–52.0)
HEMOGLOBIN: 10.2 g/dL — AB (ref 13.0–17.0)
MCH: 27.7 pg (ref 26.0–34.0)
MCHC: 32.2 g/dL (ref 30.0–36.0)
MCV: 86.1 fL (ref 78.0–100.0)
Platelets: 227 10*3/uL (ref 150–400)
RBC: 3.68 MIL/uL — AB (ref 4.22–5.81)
RDW: 14.5 % (ref 11.5–15.5)
WBC: 8.9 10*3/uL (ref 4.0–10.5)

## 2017-07-27 NOTE — Progress Notes (Signed)
Physical Therapy Treatment Patient Details Name: Jose [name] MRN: [id number] DOB: 04-Jul-1951 Today's Date: 07/27/2017    History of Present Illness Pt is a 66 y.o. male who presented to [facility] on 07/14/17 with chest pain and dyspnea. On arrival to [facility] ED, his EKG showed new onset A-fib with ST elevation in the inferolateral leads; CODE STEMI was called. Cardiac cath 11/15 with unsuccessful attempt at revascularization of occluded distal LAD, chronic occlusion of RCA. Pertinent PMH includes CAD (CTO of RCA, with residual disease in LAD/Lcx), COPD, HL, HTN, obesity,DMand tobacco use.     PT Comments    Pt remains to present with weakness and dizziness during session.  Intermittently closing his eyes during session and slow to respond to commands.  C/o tingling in R hand. BP in sitting 126/50, BP in standing 106/49.  RN informed of drop and increase in symptoms.  Pt performed propulsion in WC then BP obtained post activity and improved to 157/71.  Plan next session to progress gait if able.    Follow Up Recommendations  Home health PT;Supervision for mobility/OOB     Equipment Recommendations  None recommended by PT    Recommendations for Other Services OT consult     Precautions / Restrictions Precautions Precaution Comments: , syncope/ pre syncope Restrictions Weight Bearing Restrictions: No    Mobility  Bed Mobility Overal bed mobility: Needs Assistance Bed Mobility: Supine to Sit     Supine to sit: Supervision     General bed mobility comments: No assistance needed dizziness reported EOB and vitals obtained.   Transfers Overall transfer level: Needs assistance Equipment used: Rolling walker (2 wheeled);None Transfers: Sit to/from Stand Sit to Stand: Min assist Stand pivot transfers: Mod assist       General transfer comment: Pt required cues for hand placement to and from seated surface.  Min assist to boost into standing.  Performed with and without AD.     Ambulation/Gait Ambulation/Gait assistance: (unable due to significant decreased in BP in standing and patient reporting dizziness.  )               Hotel manager mobility: Yes(for exercise and activity tolerance.  ) Wheelchair propulsion: Both upper extremities;Both lower extermities Wheelchair parts: Needs assistance Distance: 80 ft with cues for BUE and LE to improve strength and endurance.   Wheelchair Assistance Details (indicate cue type and reason): min assist for steering.    Modified Rankin (Stroke Patients Only)       Balance Overall balance assessment: Needs assistance   Sitting balance-Leahy Scale: Fair       Standing balance-Leahy Scale: Poor Standing balance comment: Relied on RW support this session.                             Cognition Arousal/Alertness: Awake/alert Behavior During Therapy: WFL for tasks assessed/performed Overall Cognitive Status: Impaired/Different from baseline                     Current Attention Level: Selective       Awareness: Emergent Problem Solving: Requires verbal cues General Comments: Pt slower to respond this session and wife reports he has increased confusion, suspect due to meds (?). Requiring intermittent cues to keep eyes open during session and cues for sequencing throughout      Exercises  General Comments        Pertinent Vitals/Pain Pain Assessment: No/denies pain    Home Living                      Prior Function            PT Goals (current goals can now be found in the care plan section) Acute Rehab PT Goals Patient Stated Goal: Figure out what is causing these weak spells Potential to Achieve Goals: Good Progress towards PT goals: Progressing toward goals    Frequency    Min 3X/week      PT Plan Current plan remains appropriate    Co-evaluation              AM-PAC PT "6  Clicks" Daily Activity  Outcome Measure  Difficulty turning over in bed (including adjusting bedclothes, sheets and blankets)?: None Difficulty moving from lying on back to sitting on the side of the bed? : None Difficulty sitting down on and standing up from a chair with arms (e.g., wheelchair, bedside commode, etc,.)?: Unable Help needed moving to and from a bed to chair (including a wheelchair)?: A Lot Help needed walking in hospital room?: Total Help needed climbing 3-5 steps with a railing? : Total 6 Click Score: 13    End of Session Equipment Utilized During Treatment: Gait belt Activity Tolerance: Patient limited by fatigue Patient left: in chair;with call bell/phone within reach;with family/visitor present Nurse Communication: Mobility status;Other (comment) PT Visit Diagnosis: Other abnormalities of gait and mobility (R26.89)     Time: 2263-3354 PT Time Calculation (min) (ACUTE ONLY): 38 min  Charges:  $Therapeutic Exercise: 8-22 mins $Therapeutic Activity: 23-37 mins                    G CodesGovernor Rooks, PTA pager [phone]    Cristela Blue 07/27/2017, 5:08 PM

## 2017-07-27 NOTE — Progress Notes (Addendum)
PROGRESS NOTE    Jose [name]  LPF:[id number] DOB: 05-31-1951 DOA: 07/14/2017 PCP: Cleophas Dunker, MD    Brief Narrative:  66 year old male who presents with chest pain and dyspnea. He is known to have coronary artery disease, COPD, dyslipidemia, type 2 diabetes mellitus, hypertension, obesity and tobacco abuse. He had chest pain intermittently for last 3 days prior to hospitalization, associated with dyspnea on exertion. Due to his persistent symptoms he was brought to the hospital, he was found in atrial fibrillation and his electrocardiogram showed ST elevations in the inferior lateral leads, his troponin was 20, and he was immediately taken to the cardiac catheterization lab. On his initial physical examination blood pressure 115/57, heart rate 52, temperature 98.9, oxygen saturation 93% his lungs were clear to auscultation bilaterally heart S1-S2 present, irregularly irregular no S3 or S4 gallop, no murmurs, his abdomen was soft nontender, 1+ lower extremity edema  Cardiac catheterization he was found to have total occlusion of the mid and apical LAD, he underwent Unsuccessful PCI to the LAD. He also was found to have chronic occlusion of the RCA, ejection fraction is 40%.   He developed acute kidney injury. He was placed on amiodarone and started on aggressive medical therapy for his coronary artery disease. Likely not a good surgical candidate.  Patient was seen and examined with his wife at his bedside. He reports feeling weak and fatigue. Denies dyspnea, chest pain or palpitations. Reports was minimally active prior to his admission. Used a walker in order to ambulate.   Assessment & Plan:   Principal Problem:   STEMI (ST elevation myocardial infarction) ([facility]) Active Problems:   COPD (chronic obstructive pulmonary disease) (HCC)   HTN (hypertension)   Diabetes (HCC)   Morbid obesity (HCC)   OSA (obstructive sleep apnea)   Chronic diastolic CHF (congestive heart failure)  (HCC)   Atrial fibrillation with rapid ventricular response (HCC)   STEMI involving left anterior descending coronary artery (HCC)   Acute on chronic systolic CHF (congestive heart failure), NYHA class 3 (HCC)   Coronary artery disease involving native coronary artery of native heart with unstable angina pectoris (HCC)   PAF (paroxysmal atrial fibrillation) (HCC)   CRI (chronic renal insufficiency), stage 3 (moderate) ([place])   1. Coronary artery disease status post ST elevation myocardial infarction. -aspirin, clopidogrel, metoprolol, hydralazine,isosorbide -reported allergy to statins  2. Atrial fibrillation, new onset. -rate control metoprolol and amiodarone -CHADsVASC score 5 -eliquis for cva prophylaxis  3. Chronic fatigue/weakness/deconditioning -possibly multifactorial from deconditioning and from recent STEMI -PT to evaluate and treat  4. Systolic heart failure, acute on chronic,LV EF 40%, with akinesis of the distal inferior-septal wall and apex. -fluid restriction -strict I&O's daily weight -continue core measures asa, metoprolol, allergic to statin  5. Acute kidney injury on chronic kidney disease stage III. -improved -cr 1.77 from 1.93 -baseline cr.1.44 -avoid nephrotoxic med -BMP am  6. Chronic normocytic anemia -stable -no sign of bleeding -hg 10.7 mcv 85 -ferrous sulfate -cbc am  7. Type 2 diabetes mellitus. -basal insulin to 40 units bid, 8 units of aspart ac.  -ISS with hypoglycemic protocol -A1C 7.9 (07/15/17)  8. HTN. -metoprolol, hydralazine and isosorbide.   9. COPD.No acuteexacerbation. -albuterolandtiotropium.  10. Chronic depression -zoloft  11. Chronic constipation -miralax  12. OSA -CPAP at night  DVT prophylaxis:apixaban Code Status:full Family Communication:wife at bedside. All questions answered to her satisfaction Disposition Plan:Will stay another midnight until symptoms have improved and for close  monitoring.   Consultants:  Cardiology  Procedures:  Cardiac catheterization    Objective: Vitals:   07/27/17 0400 07/27/17 0412 07/27/17 0500 07/27/17 0600  BP:  (!) 90/50    Pulse: (!) 56  (!) 55   Resp: 14  12 16   Temp:      TempSrc:      SpO2:      Weight:      Height:        Intake/Output Summary (Last 24 hours) at 07/27/2017 0725 Last data filed at 07/26/2017 2106 Gross per 24 hour  Intake 840 ml  Output 600 ml  Net 240 ml   Filed Weights   07/25/17 0620 07/26/17 0532 07/27/17 0244  Weight: 127.5 kg (281 lb 1.6 oz) 128.5 kg (283 lb 4.8 oz) 133 kg (293 lb 3.4 oz)    Examination:   General: 66 yo CM obese, NAD. A&O x3 Neurology: Awake and alert, non focal  EENT: mild pallor, no icterus, oral mucosa moist Cardiovascular: No JVD. S1-S2 present, rhythmic, no gallops, rubs, or murmurs. 2+ pitting edema LE bilaterally. Pulmonary: mild decreased breath sounds bilaterally at bases, adequate air movement, no wheezing, rhonchi or rales. Gastrointestinal. Abdomen morbidly obese, no organomegaly, non tender, no rebound or guarding Skin. No open lesions noted. Musculoskeletal: no joint deformities     Data Reviewed: I have personally reviewed following labs and imaging studies  CBC: Recent Labs  Lab 07/27/17 0405  WBC 8.9  HGB 10.2*  HCT 31.7*  MCV 86.1  PLT 494   Basic Metabolic Panel: Recent Labs  Lab 07/23/17 0254 07/24/17 0205 07/25/17 0220 07/26/17 0213 07/26/17 2140 07/27/17 0405  NA 129* 126* 128* 125*  --  126*  K 4.0 4.2 4.5 4.3  --  4.1  CL 85* 80* 84* 83*  --  86*  CO2 34* 34* 33* 31  --  31  GLUCOSE 199* 293* 199* 284* 283* 173*  BUN 32* 36* 41* 43*  --  42*  CREATININE 1.60* 1.70* 1.89* 1.93*  --  1.77*  CALCIUM 9.0 9.1 8.9 8.7*  --  8.7*  MG  --  1.8  --   --   --   --    GFR: Estimated Creatinine Clearance: 58.7 mL/min (A) (by C-G formula based on SCr of 1.77 mg/dL (H)). Liver Function Tests: No results for input(s):  AST, ALT, ALKPHOS, BILITOT, PROT, ALBUMIN in the last 168 hours. No results for input(s): LIPASE, AMYLASE in the last 168 hours. No results for input(s): AMMONIA in the last 168 hours. Coagulation Profile: No results for input(s): INR, PROTIME in the last 168 hours. Cardiac Enzymes: Recent Labs  Lab 07/24/17 0952  CKTOTAL 124   BNP (last 3 results) No results for input(s): PROBNP in the last 8760 hours. HbA1C: No results for input(s): HGBA1C in the last 72 hours. CBG: Recent Labs  Lab 07/26/17 0654 07/26/17 1112 07/26/17 1607 07/26/17 2034 07/27/17 0554  GLUCAP 275* 301* 301* 402* 209*   Lipid Profile: No results for input(s): CHOL, HDL, LDLCALC, TRIG, CHOLHDL, LDLDIRECT in the last 72 hours. Thyroid Function Tests: No results for input(s): TSH, T4TOTAL, FREET4, T3FREE, THYROIDAB in the last 72 hours. Anemia Panel: No results for input(s): VITAMINB12, FOLATE, FERRITIN, TIBC, IRON, RETICCTPCT in the last 72 hours.    Radiology Studies: I have reviewed all of the imaging during this hospital visit personally     Scheduled Meds: . amiodarone  200 mg Oral BID  . amLODipine  2.5 mg Oral Daily  . apixaban  5 mg Oral BID  . aspirin EC  81 mg Oral Daily  . chlorhexidine  15 mL Mouth Rinse BID  . clopidogrel  75 mg Oral Daily  . ferrous sulfate  325 mg Oral Q breakfast  . furosemide  80 mg Intravenous Once  . hydrALAZINE  50 mg Oral Q8H  . Influenza vac split quadrivalent PF  0.5 mL Intramuscular Tomorrow-1000  . insulin aspart  0-5 Units Subcutaneous QHS  . insulin aspart  0-9 Units Subcutaneous TID WC  . insulin aspart  8 Units Subcutaneous TID WC  . insulin glargine  40 Units Subcutaneous BID  . isosorbide mononitrate  30 mg Oral Daily  . magnesium oxide  400 mg Oral Daily  . mouth rinse  15 mL Mouth Rinse q12n4p  . metoprolol succinate  12.5 mg Oral BID  . omega-3 acid ethyl esters  2 g Oral Q breakfast  . omega-3 acid ethyl esters  3 g Oral BID AC  .  polyethylene glycol  17 g Oral BID  . sertraline  100 mg Oral Daily  . tiotropium  18 mcg Inhalation QHS   Continuous Infusions:   LOS: 13 days        Kayleen Memos, MD [facility] Pager [phone]

## 2017-07-27 NOTE — Progress Notes (Addendum)
Pt's wife & son notified this RN that pt had an episode of weakness returning from bathroom, to the point that son had to "basically catch & carry" back to bed. Remains alert & oriented; VSS. Reports mild dizziness. Cards fellow paged to inquire about scheduled Metoprolol dosage (dictated cardiology note this AM stated could possibly decrease to 12.5 mg BID). Instructed pt not to attempt getting up without assist; understanding verbalized. Bed alarm on; call bell within reach. Will continue to monitor.

## 2017-07-27 NOTE — Progress Notes (Signed)
Progress Note  Patient Name: Jose [name] Date of Encounter: 07/27/2017  Primary Cardiologist: Harl Bowie  Subjective   Patient denies chest pain, palpitations, shortness of breath. He continues to have episodes of weakness.   He remained persistently bradycardic to 50s yesterday so metoprolol was lowered to 12.5mg  BID.  Inpatient Medications    Scheduled Meds: . amiodarone  200 mg Oral BID  . amLODipine  2.5 mg Oral Daily  . apixaban  5 mg Oral BID  . aspirin EC  81 mg Oral Daily  . chlorhexidine  15 mL Mouth Rinse BID  . clopidogrel  75 mg Oral Daily  . ferrous sulfate  325 mg Oral Q breakfast  . furosemide  80 mg Intravenous Once  . hydrALAZINE  50 mg Oral Q8H  . Influenza vac split quadrivalent PF  0.5 mL Intramuscular Tomorrow-1000  . insulin aspart  0-5 Units Subcutaneous QHS  . insulin aspart  0-9 Units Subcutaneous TID WC  . insulin aspart  8 Units Subcutaneous TID WC  . insulin glargine  40 Units Subcutaneous BID  . isosorbide mononitrate  30 mg Oral Daily  . magnesium oxide  400 mg Oral Daily  . mouth rinse  15 mL Mouth Rinse q12n4p  . metoprolol succinate  12.5 mg Oral BID  . omega-3 acid ethyl esters  2 g Oral Q breakfast  . omega-3 acid ethyl esters  3 g Oral BID AC  . polyethylene glycol  17 g Oral BID  . sertraline  100 mg Oral Daily  . tiotropium  18 mcg Inhalation QHS   Continuous Infusions:  PRN Meds: acetaminophen, albuterol, morphine injection, nitroGLYCERIN, ondansetron (ZOFRAN) IV, oxyCODONE-acetaminophen   Vital Signs    Vitals:   07/27/17 0500 07/27/17 0600 07/27/17 0750 07/27/17 0932  BP:   126/78   Pulse: (!) 55   (!) 58  Resp: 12 16    Temp:      TempSrc:      SpO2:      Weight:      Height:        Intake/Output Summary (Last 24 hours) at 07/27/2017 1029 Last data filed at 07/27/2017 0804 Gross per 24 hour  Intake 840 ml  Output 550 ml  Net 290 ml   Filed Weights   07/25/17 0620 07/26/17 0532 07/27/17 0244  Weight: 281 lb  1.6 oz (127.5 kg) 283 lb 4.8 oz (128.5 kg) 293 lb 3.4 oz (133 kg)    Telemetry    Sinus rhythm, occasional PVCs- Personally Reviewed 06/2277  ECG    N/a - Personally Reviewed  Physical Exam   GEN: No acute distress, lying in bed.   Neck: hard to evaluate JVD due to obesity Cardiac: RRR, no murmurs, rubs, or gallops.  Respiratory: Clear to auscultation bilaterally, but very diminished breath sounds bilaterally GI: Soft, nontender; distended MS: 2+ pitting edema to thighs L>R today; 1+ pitting edema in sacrum Neuro: CN 2-12 grossly intact Psych: Normal affect   Labs    Chemistry Recent Labs  Lab 07/25/17 0220 07/26/17 0213 07/26/17 2140 07/27/17 0405  NA 128* 125*  --  126*  K 4.5 4.3  --  4.1  CL 84* 83*  --  86*  CO2 33* 31  --  31  GLUCOSE 199* 284* 283* 173*  BUN 41* 43*  --  42*  CREATININE 1.89* 1.93*  --  1.77*  CALCIUM 8.9 8.7*  --  8.7*  GFRNONAA 35* 35*  --  38*  GFRAA 41* 40*  --  44*  ANIONGAP 11 11  --  9     Hematology Recent Labs  Lab 07/27/17 0405  WBC 8.9  RBC 3.68*  HGB 10.2*  HCT 31.7*  MCV 86.1  MCH 27.7  MCHC 32.2  RDW 14.5  PLT 227    Cardiac Enzymes No results for input(s): TROPONINI in the last 168 hours.  No results for input(s): TROPIPOC in the last 168 hours.   BNP No results for input(s): BNP, PROBNP in the last 168 hours.   DDimer No results for input(s): DDIMER in the last 168 hours.   Radiology    No results found.  Cardiac Studies   Cardiac cath July 14, 2017  -unsuccessful attempted PTCA of occluded distal LAD, chronic occlusion of RCA Diagnostic Diagram       Post-Intervention Diagram        Echo July 14, 2017 - Left ventricle: LVEF is approximately 40% with akinesis of the distal inferoseptal wall and apex. Aneurysmal dilatation of apex. Consider limited echo with contrast to evaluate for thrombus. The cavity size was normal. Wall thickness was increased in a pattern of mild  LVH. - Mitral valve: Calcified annulus. Mildly thickened leaflets .  Patient Profile     66 y.o. male presenting with extensive anteroapical and inferior infarction, unsuccessful attempt at revascularization of occluded distal LAD, chronic occlusion of RCA.   Moderate reduction in ventricular systolic function.   Atrial fibrillation with rapid ventricular response present on arrival, resolved with amiodarone.   Moderate acute renal failure related to contrast nephrotoxicity/retention, improving.    Assessment & Plan    CAD s/p STEMI:  Unsuccessful PCI to distal LAD occlusion and not optimal surgical candidate at this time; echo consistent with aneurysmal dilation of apex.  --asa 81mg  daily x 1 month, plavix 75mg  daily, atorvastatin 40mg  daily, lopresor 12.5mg  BID --Eliquis for LV dilation  Acute on chronic systolic CHF:  Ins/outs not accurately recorded; weight up today but questionable; still having significant LE and sacral edema but much improved from last week. --holding on starting ACE-I until renal function stabilizes --metoprolol 12.5mg  BID --hold diuresis today, possibly start PO lasix tomorrow  AFib:  Maintaining sinus rhythm. Plan current dose of amiodarone 4 weeks (200mg  BID), then decrease to maintenance dose of 200 mg daily CHADSVasc 5 (age, hypertension, diabetes, heart failure, CAD). --amiodarone 200mg  BID --Eliquis   Episodes of weakness: Does not have clear seizure like activity or stroke like symptoms. He does have known L ICA stenosis and might be having symptoms from this, though this is less likely as his VS remain stable as when not symptomatic so he shouldn't have decreased blood flow. Metoprolol was lowered for bradycardia. His hyponatremia could be contributing. Neuro to see.  HTN: Amlodipine 2.5mg  daily, hydralazine 50mg  TID, metoprolol 12.5mg  BID. BP stable today.  Acute renal failure on CKD stage III:   Initial episode with etiology likely combo of  contrast nephrotoxicity and urinary retention (now resolved, no foley needed). Cr with some improvement today. --f/u am bmet  OSA: Encouraged patient to wear CPAP tonight.  DM:  IM assisting in DM management.  COPD/Smoking:  Stable. Strongly recommend permanent smoking cessation.  Hyponatremia: Stable at 126. --fluid restriction, diuresis  Dispo: HH face to face orders placed; on d/c need meds called in to [place], [place] (phone # [phone] ex [phone]) and ask they be expedited so they will be over-nighted to patient.  For questions or updates, please  contact [place] Please consult [email or web address] for contact info under Cardiology/STEMI.   Signed, Alphonzo Grieve, MD  07/27/2017, 10:29 AM    I have examined the patient and reviewed assessment and plan and discussed with patient.  Agree with above as stated.  Appears stable from a cardiac standpoint.  Family awaiting neuro input regarding these spells.  Larae Grooms

## 2017-07-27 NOTE — Progress Notes (Signed)
Order received regarding Metoprolol administration (see MAR).

## 2017-07-28 ENCOUNTER — Inpatient Hospital Stay (HOSPITAL_COMMUNITY): Payer: Medicare Other

## 2017-07-28 LAB — CBC
HEMATOCRIT: 33.8 % — AB (ref 39.0–52.0)
HEMOGLOBIN: 10.8 g/dL — AB (ref 13.0–17.0)
MCH: 27.3 pg (ref 26.0–34.0)
MCHC: 32 g/dL (ref 30.0–36.0)
MCV: 85.6 fL (ref 78.0–100.0)
Platelets: 251 10*3/uL (ref 150–400)
RBC: 3.95 MIL/uL — AB (ref 4.22–5.81)
RDW: 14.5 % (ref 11.5–15.5)
WBC: 9.8 10*3/uL (ref 4.0–10.5)

## 2017-07-28 LAB — BASIC METABOLIC PANEL
ANION GAP: 11 (ref 5–15)
BUN: 43 mg/dL — ABNORMAL HIGH (ref 6–20)
CHLORIDE: 85 mmol/L — AB (ref 101–111)
CO2: 29 mmol/L (ref 22–32)
Calcium: 8.7 mg/dL — ABNORMAL LOW (ref 8.9–10.3)
Creatinine, Ser: 1.77 mg/dL — ABNORMAL HIGH (ref 0.61–1.24)
GFR, EST AFRICAN AMERICAN: 44 mL/min — AB (ref >60)
GFR, EST NON AFRICAN AMERICAN: 38 mL/min — AB (ref >60)
Glucose, Bld: 178 mg/dL — ABNORMAL HIGH (ref 65–99)
POTASSIUM: 4.2 mmol/L (ref 3.5–5.1)
Sodium: 125 mmol/L — ABNORMAL LOW (ref 135–145)

## 2017-07-28 LAB — GLUCOSE, CAPILLARY
GLUCOSE-CAPILLARY: 204 mg/dL — AB (ref 65–99)
GLUCOSE-CAPILLARY: 168 mg/dL — AB (ref 65–99)
Glucose-Capillary: 167 mg/dL — ABNORMAL HIGH (ref 65–99)
Glucose-Capillary: 202 mg/dL — ABNORMAL HIGH (ref 65–99)
Glucose-Capillary: 117 mg/dL — ABNORMAL HIGH (ref 65–99)

## 2017-07-28 MED ORDER — LORAZEPAM 2 MG/ML IJ SOLN
1.0000 mg | Freq: Once | INTRAMUSCULAR | Status: AC
  Administered 2017-07-28: 1 mg via INTRAVENOUS
  Filled 2017-07-28: qty 1

## 2017-07-28 NOTE — Progress Notes (Signed)
Progress Note  Patient Name: Jose [name] Date of Encounter: 07/28/2017  Primary Cardiologist: Harl Bowie  Subjective   Patient continues to have intermittent episodes of being altered, uncoordinated, though without focal neuro findings.  He denies chest pain, shortness of breath, palpitations.  Inpatient Medications    Scheduled Meds: . amiodarone  200 mg Oral BID  . amLODipine  2.5 mg Oral Daily  . apixaban  5 mg Oral BID  . aspirin EC  81 mg Oral Daily  . chlorhexidine  15 mL Mouth Rinse BID  . clopidogrel  75 mg Oral Daily  . ferrous sulfate  325 mg Oral Q breakfast  . hydrALAZINE  50 mg Oral Q8H  . Influenza vac split quadrivalent PF  0.5 mL Intramuscular Tomorrow-1000  . insulin aspart  0-5 Units Subcutaneous QHS  . insulin aspart  0-9 Units Subcutaneous TID WC  . insulin aspart  8 Units Subcutaneous TID WC  . insulin glargine  40 Units Subcutaneous BID  . isosorbide mononitrate  30 mg Oral Daily  . magnesium oxide  400 mg Oral Daily  . mouth rinse  15 mL Mouth Rinse q12n4p  . metoprolol succinate  12.5 mg Oral BID  . omega-3 acid ethyl esters  2 g Oral Q breakfast  . omega-3 acid ethyl esters  3 g Oral BID AC  . polyethylene glycol  17 g Oral BID  . sertraline  100 mg Oral Daily  . tiotropium  18 mcg Inhalation QHS   Continuous Infusions:  PRN Meds: acetaminophen, albuterol, morphine injection, nitroGLYCERIN, ondansetron (ZOFRAN) IV, oxyCODONE-acetaminophen   Vital Signs    Vitals:   07/27/17 2114 07/27/17 2308 07/28/17 0610 07/28/17 0848  BP: (!) 139/56 140/75 131/84 (!) 113/53  Pulse: 66  62 67  Resp: 12 13 (!) 21   Temp: (!) 97.2 F (36.2 C)  97.6 F (36.4 C)   TempSrc: Axillary  Oral   SpO2: 100% 100% 100%   Weight:   287 lb 7.7 oz (130.4 kg)   Height:        Intake/Output Summary (Last 24 hours) at 07/28/2017 1040 Last data filed at 07/28/2017 0900 Gross per 24 hour  Intake 840 ml  Output 450 ml  Net 390 ml   Filed Weights   07/26/17  0532 07/27/17 0244 07/28/17 0610  Weight: 283 lb 4.8 oz (128.5 kg) 293 lb 3.4 oz (133 kg) 287 lb 7.7 oz (130.4 kg)    Telemetry    Sinus rhythm, occasional PVCs- Personally Reviewed 07/28/17  ECG    N/a - Personally Reviewed  Physical Exam   GEN: No acute distress, lying in bed.   Neck: hard to evaluate JVD due to obesity Cardiac: RRR, no murmurs, rubs, or gallops.  Respiratory: Clear to auscultation bilaterally, but very diminished breath sounds bilaterally GI: Soft, nontender; distended MS: 2+ pitting edema to thighs L>R today; 1+ pitting edema in sacrum Neuro: CN 2-12 grossly intact, answers questions appropriately though does appear altered Psych: Normal affect   Labs    Chemistry Recent Labs  Lab 07/26/17 0213 07/26/17 2140 07/27/17 0405 07/28/17 0247  NA 125*  --  126* 125*  K 4.3  --  4.1 4.2  CL 83*  --  86* 85*  CO2 31  --  31 29  GLUCOSE 284* 283* 173* 178*  BUN 43*  --  42* 43*  CREATININE 1.93*  --  1.77* 1.77*  CALCIUM 8.7*  --  8.7* 8.7*  GFRNONAA 35*  --  38* [place]  --  9 11     Hematology Recent Labs  Lab 07/27/17 0405 07/28/17 0247  WBC 8.9 9.8  RBC 3.68* 3.95*  HGB 10.2* 10.8*  HCT 31.7* 33.8*  MCV 86.1 85.6  MCH 27.7 27.3  MCHC 32.2 32.0  RDW 14.5 14.5  PLT 227 251    Cardiac Enzymes No results for input(s): TROPONINI in the last 168 hours.  No results for input(s): TROPIPOC in the last 168 hours.   BNP No results for input(s): BNP, PROBNP in the last 168 hours.   DDimer No results for input(s): DDIMER in the last 168 hours.   Radiology    No results found.  Cardiac Studies   Cardiac cath July 14, 2017  -unsuccessful attempted PTCA of occluded distal LAD, chronic occlusion of RCA Diagnostic Diagram       Post-Intervention Diagram        Echo July 14, 2017 - Left ventricle: LVEF is approximately 40% with akinesis of the distal inferoseptal wall and apex.  Aneurysmal dilatation of apex. Consider limited echo with contrast to evaluate for thrombus. The cavity size was normal. Wall thickness was increased in a pattern of mild LVH. - Mitral valve: Calcified annulus. Mildly thickened leaflets .  Patient Profile     66 y.o. male presenting with extensive anteroapical and inferior infarction, unsuccessful attempt at revascularization of occluded distal LAD, chronic occlusion of RCA.   Moderate reduction in ventricular systolic function.   Atrial fibrillation with rapid ventricular response present on arrival, resolved with amiodarone.   Moderate acute renal failure related to contrast nephrotoxicity/retention, improving.    Assessment & Plan    CAD s/p STEMI:  Unsuccessful PCI to distal LAD occlusion and not optimal surgical candidate at this time; echo consistent with aneurysmal dilation of apex.  --asa 81mg  daily x 1 month, plavix 75mg  daily, atorvastatin 40mg  daily, lopresor 12.5mg  BID --Eliquis for LV dilation  Acute on chronic systolic CHF:  Ins/outs not accurately recorded; weights inaccurate; still having significant LE and sacral edema. --holding on starting ACE-I until renal function stabilizes --metoprolol 12.5mg  BID --can consider starting PO lasix tomorrow  AFib:  Maintaining sinus rhythm. Plan current dose of amiodarone 4 weeks (200mg  BID), then decrease to maintenance dose of 200 mg daily CHADSVasc 5 (age, hypertension, diabetes, heart failure, CAD). --amiodarone 200mg  BID --Eliquis   Episodes of weakness: Does not have clear seizure like activity or stroke like symptoms. He does have known L ICA stenosis and might be having symptoms from this, though this is less likely as his VS remain stable as when not symptomatic so he shouldn't have decreased blood flow. Metoprolol was lowered for bradycardia. His hyponatremia could be contributing. Neuro to see.  HTN: Amlodipine 2.5mg  daily, hydralazine 50mg  TID, metoprolol  12.5mg  BID. BP stable today.  Acute renal failure on CKD stage III:   Initial episode with etiology likely combo of contrast nephrotoxicity and urinary retention (now resolved, no foley needed). Cr with stable today. --f/u am bmet  OSA: On CPAP qhs.  DM:  IM assisting in DM management.  COPD/Smoking:  Stable. Strongly recommend permanent smoking cessation.  Hyponatremia: Stable at 125. --since he has been off of diuretics we can get urine studies  Dispo: [facility] face to face orders placed; on d/c need meds called in to [place], [place] (phone # [phone] ex [phone]) and ask they be expedited so they will be over-nighted  to patient.  For questions or updates, please contact [place] Please consult [email or web address] for contact info under Cardiology/STEMI.   Signed, Alphonzo Grieve, MD  07/28/2017, 10:40 AM    I have examined the patient and reviewed assessment and plan and discussed with patient.  Agree with above as stated.  Stable from cardiac standpoint.  Awaiting neurology consult.  Need to verify that neurology has been called.   Larae Grooms

## 2017-07-28 NOTE — Consult Note (Signed)
NEURO HOSPITALIST CONSULT NOTE   Requestig physician: Dr. Nevada Crane   Reason for Consult: Generalized weakness   History obtained from:  Chart     HPI:                                                                                                                                          Jose [name] is an 66 y.o. male presenting to the hospital with dyspnea and intermittent chest pain for the past 3 days.  Due to the symptoms he was brought to the hospital found to be in atrial fibrillation with ST elevations in the inferior lateral leads.  Patient had a cardiac catheterization and found to have total occlusion of the mid and apical LAD.  Patient went under a unsuccessful PCI to the LAD.  He was found to have a chronic occlusion of the RCA and ejection fraction of 40.  Patient also developed acute kidney injury and was placed on amiodarone.  While hospitalized patient had a episode in which she had significant weakness when returning from the bathroom to the point where he had to be carried back to the bed by his son.  While hospitalized patient continued to report feeling weak and fatigued.  Per PT during his sessions patient remained weak and dizzy.  He would intermittently closes eyes during the sessions and slow to respond to commands.  Patient was orthostatic with blood pressure of 126/50 sitting and standing 106/49.    Of note patient's blood pressure has been inconsistent in all over the place.  Patient has had blood pressures as low as 90/50 and as high as 157/71.  Pulse rate has remained between 57 and 52.  He remains afebrile.   Of significant note patient's sodium is 125, creatinine 177, BUN 43, blood glucose has ranged between 70-all the way up to 367.   In speaking with the wife she states that he is generally not very mobile.  On a daily basis patient will walk from room to room but mostly stays in a seated position and uses a walker at times.  While in the hospital  she is noted that he has become more confused than usual.  Past Medical History:  Diagnosis Date  . CAD (coronary artery disease)    a. prior silent inferior MI, NSTEMI 07/2014 with chronically occluded RCA s/p unsuccessful PCI with otherwise nonobstructive residual disease  . Chronic respiratory failure ([place])    a. Placed on home O2 01/2016.  [name] Kitchen COPD (chronic obstructive pulmonary disease) ([place])   . Diabetes mellitus ([place])   . Former tobacco use   . Hyperlipidemia   . Hypertension   . Morbid obesity ([place])   . Neuropathy   . Sleep apnea     Past Surgical  History:  Procedure Laterality Date  . BACK SURGERY    . CORONARY BALLOON ANGIOPLASTY N/A 07/14/2017   Procedure: CORONARY BALLOON ANGIOPLASTY;  Surgeon: Martinique, Peter M, MD;  Location: [facility];  Service: Cardiovascular;  Laterality: N/A;  . INTRAVASCULAR ULTRASOUND/IVUS N/A 07/14/2017   Procedure: Intravascular Ultrasound/IVUS;  Surgeon: Martinique, Peter M, MD;  Location: [facility];  Service: Cardiovascular;  Laterality: N/A;  . LEFT HEART CATH AND CORONARY ANGIOGRAPHY N/A 07/14/2017   Procedure: LEFT HEART CATH AND CORONARY ANGIOGRAPHY;  Surgeon: Martinique, Peter M, MD;  Location: [facility];  Service: Cardiovascular;  Laterality: N/A;  . LEFT HEART CATHETERIZATION WITH CORONARY ANGIOGRAM N/A 08/28/2014   Procedure: LEFT HEART CATHETERIZATION WITH CORONARY ANGIOGRAM;  Surgeon: Clent Demark, MD;  Location: [facility];  Service: Cardiovascular;  Laterality: N/A;  . PERIPHERAL VASCULAR CATHETERIZATION Bilateral 03/22/2016   Procedure: Carotid Angiography;  Surgeon: Elam Dutch, MD;  Location: [facility];  Service: Cardiovascular;  Laterality: Bilateral;    Family History  Problem Relation Age of Onset  . Hypertension Father   . Heart disease Father      Social History:  reports that he has quit smoking. His smoking use included cigarettes. He has a 135.00 pack-year smoking history. he has  never used smokeless tobacco. He reports that he does not drink alcohol or use drugs.  Allergies  Allergen Reactions  . Atorvastatin     Tremors  . Simvastatin     Muscle weakness    MEDICATIONS:                                                                                                                     Prior to Admission:  Medications Prior to Admission  Medication Sig Dispense Refill Last Dose  . aspirin 81 MG tablet Take 81 mg by mouth daily.   07/14/2017 at Unknown time  . clopidogrel (PLAVIX) 75 MG tablet Take 1 tablet (75 mg total) by mouth daily. 30 tablet 2 07/13/2017 at Unknown time  . ergocalciferol (VITAMIN D2) 50000 UNITS capsule Take 50,000 Units by mouth See admin instructions. Take one twice a week on wednesdays and saturdays   07/13/2017 at Unknown time  . ferrous sulfate 325 (65 FE) MG tablet Take 325 mg by mouth 3 (three) times daily with meals.   07/13/2017 at Unknown time  . furosemide (LASIX) 40 MG tablet Take 40 mg by mouth 2 (two) times daily.   07/13/2017 at Unknown time  . glipiZIDE (GLUCOTROL) 5 MG tablet Take 5 mg 2 (two) times daily by mouth. 10mg  in the morning, 5mg  in the afternoon, 5mg  in the evening   07/13/2017 at Unknown time  . insulin aspart (NOVOLOG) 100 UNIT/ML injection Inject 30 Units into the skin 3 (three) times daily before meals.    07/13/2017 at Unknown time  . insulin glargine (LANTUS) 100 UNIT/ML injection Inject 0.3-0.6 mLs (30-60 Units total) into the skin See admin instructions. 50 units in  the AM and 60 units in the PM (Patient taking differently: Inject 45-60 Units See admin instructions into the skin. 45 units in the AM and 60 units in the PM) 10 mL 11 07/13/2017 at Unknown time  . magnesium oxide (MAG-OX) 400 MG tablet Take 1 tablet (400 mg total) by mouth daily. 34 tablet 3 07/13/2017 at Unknown time  . metFORMIN (GLUCOPHAGE) 1000 MG tablet Take 1 tablet (1,000 mg total) by mouth 2 (two) times daily with a meal. 60 tablet 3  07/13/2017 at Unknown time  . metoprolol tartrate (LOPRESSOR) 25 MG tablet Take 0.5 tablets (12.5 mg total) by mouth 2 (two) times daily. 180 tablet 3 07/13/2017 at am  . Multiple Vitamins-Minerals (ICAPS AREDS 2 PO) Take 1 capsule by mouth 2 (two) times daily.    07/13/2017 at Unknown time  . mupirocin cream (BACTROBAN) 2 % Apply topically daily. (Patient taking differently: Apply topically daily as needed. ) 15 g 0 PRN  . nitroGLYCERIN (NITROSTAT) 0.4 MG SL tablet Place 1 tablet (0.4 mg total) under the tongue every 5 (five) minutes x 3 doses as needed for chest pain. 25 tablet 12 07/14/2017 at Unknown time  . Omega-3 Fatty Acids (FISH OIL) 1000 MG CAPS Take 2,000-3,000 mg See admin instructions by mouth. Take 2000 mg every morning Take 3000 mg at noon and evening.   07/13/2017 at Unknown time  . oxyCODONE-acetaminophen (PERCOCET/ROXICET) 5-325 MG per tablet Take 2 tablets by mouth every 4 (four) hours. Take eight tablets daily as needed for pain per family and list. For bad  Back pain   07/13/2017 at Unknown time  . sertraline (ZOLOFT) 100 MG tablet Take 100 mg by mouth daily.   07/13/2017 at Unknown time  . tiotropium (SPIRIVA) 18 MCG inhalation capsule Place 18 mcg into inhaler and inhale at bedtime.    07/13/2017 at Unknown time  . vitamin B-12 (CYANOCOBALAMIN) 1000 MCG tablet Take 1,000 mcg by mouth daily.   07/13/2017 at Unknown time  . pravastatin (PRAVACHOL) 20 MG tablet Take 1 tablet (20 mg total) by mouth once a week. 90 tablet 3 07/08/2017   Scheduled: . amiodarone  200 mg Oral BID  . amLODipine  2.5 mg Oral Daily  . apixaban  5 mg Oral BID  . aspirin EC  81 mg Oral Daily  . chlorhexidine  15 mL Mouth Rinse BID  . clopidogrel  75 mg Oral Daily  . ferrous sulfate  325 mg Oral Q breakfast  . hydrALAZINE  50 mg Oral Q8H  . Influenza vac split quadrivalent PF  0.5 mL Intramuscular Tomorrow-1000  . insulin aspart  0-5 Units Subcutaneous QHS  . insulin aspart  0-9 Units Subcutaneous TID  WC  . insulin aspart  8 Units Subcutaneous TID WC  . insulin glargine  40 Units Subcutaneous BID  . isosorbide mononitrate  30 mg Oral Daily  . magnesium oxide  400 mg Oral Daily  . mouth rinse  15 mL Mouth Rinse q12n4p  . metoprolol succinate  12.5 mg Oral BID  . omega-3 acid ethyl esters  2 g Oral Q breakfast  . omega-3 acid ethyl esters  3 g Oral BID AC  . polyethylene glycol  17 g Oral BID  . sertraline  100 mg Oral Daily  . tiotropium  18 mcg Inhalation QHS   Continuous:    ROS:  History obtained from the patient  General ROS: negative for - chills, fatigue, fever, night sweats, weight gain or weight loss Psychological ROS: Positive for -  memory difficulties, Ophthalmic ROS: negative for - blurry vision, double vision, eye pain or loss of vision ENT ROS: negative for - epistaxis, nasal discharge, oral lesions, sore throat, tinnitus or vertigo Allergy and Immunology ROS: negative for - hives or itchy/watery eyes Hematological and Lymphatic ROS: negative for - bleeding problems, bruising or swollen lymph nodes Endocrine ROS: negative for - galactorrhea, hair pattern changes, polydipsia/polyuria or temperature intolerance Respiratory ROS: Positive for -  shortness of breath  Cardiovascular ROS: Positive for - dyspnea on exertion, edema  Gastrointestinal ROS: negative for - abdominal pain, diarrhea, hematemesis, nausea/vomiting or stool incontinence Genito-Urinary ROS: negative for - dysuria, hematuria, incontinence or urinary frequency/urgency Musculoskeletal ROS: Positive for -  muscular weakness Neurological ROS: as noted in HPI Dermatological ROS: negative for rash and skin lesion changes   Blood pressure (!) 113/53, pulse 67, temperature 97.6 F (36.4 C), temperature source Oral, resp. rate (!) 21, height 6\' 1"  (1.854 m), weight 130.4 kg (287  lb 7.7 oz), SpO2 100 %.   Neurologic Examination:                                                                                                      HEENT-  Normocephalic, no lesions, without obvious abnormality.  Normal external eye and conjunctiva.  Normal TM's bilaterally.  Normal auditory canals and external ears. Normal external nose, mucus membranes and septum.  Normal pharynx. Cardiovascular- S1, S2 normal, pulses palpable throughout   Lungs- chest clear, no wheezing, rales, normal symmetric air entry Abdomen- abnormal findings:  distended Extremities- Positive edema in the lower extremities Lymph-no adenopathy palpable Musculoskeletal-no joint tenderness, deformity or swelling Skin-warm and dry, no hyperpigmentation, vitiligo, or suspicious lesions  Neurological Examination Mental Status: Alert, oriented to his room in the hospital but he is unable to tell the date or the president.  Speech fluent without evidence of aphasia.  Able to follow simple commands without difficulty. Cranial Nerves: II: Visual fields grossly normal,  III,IV, VI: ptosis not present, extra-ocular motions intact bilaterally pupils equal, round, reactive to light and accommodation V,VII: smile symmetric, at rest he has a right facial droop however he is edentulous and this very well could be the cause of that.  Facial light touch sensation normal bilaterally VIII: hearing normal bilaterally IX,X: uvula rises symmetrically XI: bilateral shoulder shrug XII: midline tongue extension Motor: Right : Upper extremity   5/5    Left:     Upper extremity   5/5  Lower extremity   5/5     Lower extremity   5/5 Bulk throughout his decreased Sensory: Pinprick and light touch intact throughout, bilaterally Deep Tendon Reflexes: 1+ and symmetric throughout Plantars: Right: downgoing   Left: downgoing Cerebellar: normal finger-to-nose,  and normal heel-to-shin test Gait: Not tested      Lab Results: Basic  Metabolic Panel: Recent Labs  Lab 07/24/17 0205 07/25/17 0220 07/26/17 0213 07/26/17 2140 07/27/17 0405 07/28/17  0247  NA 126* 128* 125*  --  126* 125*  K 4.2 4.5 4.3  --  4.1 4.2  CL 80* 84* 83*  --  86* 85*  CO2 34* 33* 31  --  31 29  GLUCOSE 293* 199* 284* 283* 173* 178*  BUN 36* 41* 43*  --  42* 43*  CREATININE 1.70* 1.89* 1.93*  --  1.77* 1.77*  CALCIUM 9.1 8.9 8.7*  --  8.7* 8.7*  MG 1.8  --   --   --   --   --     Liver Function Tests: No results for input(s): AST, ALT, ALKPHOS, BILITOT, PROT, ALBUMIN in the last 168 hours. No results for input(s): LIPASE, AMYLASE in the last 168 hours. No results for input(s): AMMONIA in the last 168 hours.  CBC: Recent Labs  Lab 07/27/17 0405 07/28/17 0247  WBC 8.9 9.8  HGB 10.2* 10.8*  HCT 31.7* 33.8*  MCV 86.1 85.6  PLT 227 251    Cardiac Enzymes: Recent Labs  Lab 07/24/17 0952  CKTOTAL 124    Lipid Panel: No results for input(s): CHOL, TRIG, HDL, CHOLHDL, VLDL, LDLCALC in the last 168 hours.  CBG: Recent Labs  Lab 07/27/17 1631 07/27/17 2113 07/28/17 0619 07/28/17 0808 07/28/17 1132  GLUCAP 222* 262* 167* 204* 9*    Microbiology: Results for orders placed or performed during the hospital encounter of 07/14/17  MRSA PCR Screening     Status: None   Collection Time: 07/14/17 12:13 PM  Result Value Ref Range Status   MRSA by PCR NEGATIVE NEGATIVE Final    Comment:        The GeneXpert MRSA Assay (FDA approved for NASAL specimens only), is one component of a comprehensive MRSA colonization surveillance program. It is not intended to diagnose MRSA infection nor to guide or monitor treatment for MRSA infections.     Coagulation Studies: No results for input(s): LABPROT, INR in the last 72 hours.  Imaging: No results found.     Assessment and plan per attending neurologist  Etta Quill PA-C [facility] [phone]  07/28/2017, 12:32 PM Attending Neurohospitalist  Addendum Patient seen and examined. Agree with the history and physical as documented above. Agree with the plan as documented, which I helped formulate. I have independently obtaining history, and review of systems on my exam and the patient and reviewed the chart including pertinent labs and pertinent imaging.   Assessment/Plan: 66 year old man with multiple cerebrovascular risk factors, who is currently admitted for management of a ST elevation MI along with acute kidney injury and fluid overload, complaining of confusion and weakness, which is more prominent since admission to the hospital. At his baseline, he owns a business and is cognitively very clear but since his admission, has been off of his cognitive baseline and appears more confused per his wife. Physically, he appears to be weak all over with no focality on his exam. Upon review of his chart, he has multiple electrolyte abnormalities including hyponatremia, fluctuations in his blood sugars that ranged from 70s to the 300s, and acute kidney injury. His physical exam reveals no focal weakness but general exam shows evidence of bilateral pedal edema and grossly distended abdomen, and dyspnea. It is likely, that his presentation is most consistent with toxic metabolic encephalopathy, but given his prior history of strokes, and stroke risk factors and a recent MI, we will recommend obtaining brain imaging to evaluate for the presence of stroke.  Impression -Toxic metabolic encephalopathy -Evaluate  for stroke  Recommend: -Obtain MRI/MRA of brain to evaluate for stroke -Treat underlying cardiac issues per primary team as you are -Treat underlying metabolic derangements -PT OT ST  Neurology will follow with you after imaging. --- Amie Portland, MD [facility] [phone]  If 7pm to 7am, please call on call as listed on AMION.

## 2017-07-28 NOTE — Progress Notes (Signed)
Pt's wife called this RN to notified about patient having a short period of weakness when up and trying to go back to bed. Pt was " bouncing back and forth" while sitting in bed. Neuro check done with no significant changes. We'll continue to monitor.

## 2017-07-28 NOTE — Progress Notes (Signed)
Occupational Therapy Treatment Patient Details Name: Jose [name] MRN: [id number] DOB: 08/14/1951 Today's Date: 07/28/2017    History of present illness Pt is a 66 y.o. male who presented to [facility] on 07/14/17 with chest pain and dyspnea. On arrival to [facility] ED, his EKG showed new onset A-fib with ST elevation in the inferolateral leads; CODE STEMI was called. Cardiac cath 11/15 with unsuccessful attempt at revascularization of occluded distal LAD, chronic occlusion of RCA. Pertinent PMH includes CAD (CTO of RCA, with residual disease in LAD/Lcx), COPD, HL, HTN, obesity,DMand tobacco use.    OT comments  Pt with limited session today due to arousal. Assisted RN with bed mobility. Consulted with wife and RN who shared that he has not been able to manage his utensils. Provided education on built up handles, and red foam handle provided. Pt is scheduled for MRI later today/tonight. OT will continue to follow in the acute setting.   Follow Up Recommendations  Home health OT;Supervision/Assistance - 24 hour(monitor closely for abilities)    Equipment Recommendations  3 in 1 bedside commode    Recommendations for Other Services      Precautions / Restrictions Precautions Precautions: Fall Precaution Comments: , syncope/ pre syncope Restrictions Weight Bearing Restrictions: No       Mobility Bed Mobility Overal bed mobility: Needs Assistance Bed Mobility: Rolling Rolling: Max assist         General bed mobility comments: Pt very very lethargic due to medication. Do not think that these bed mobility levels are typical or indicative of abilities  Transfers                 General transfer comment: not attempted this session    Balance                                           ADL either performed or assessed with clinical judgement   ADL Overall ADL's : Needs assistance/impaired   Eating/Feeding Details (indicate cue type and reason): Talking with RN  and wife, Pt has been unable to manage utensils, red foam built up handle provided for Pt and wife/RN instructed in use                                   General ADL Comments: very limited session due to ativan     Vision       Perception     Praxis      Cognition Arousal/Alertness: Suspect due to medications(Pt just given ativan) Behavior During Therapy: Flat affect Overall Cognitive Status: Difficult to assess                                          Exercises     Shoulder Instructions       General Comments Wife present    Pertinent Vitals/ Pain       Pain Assessment: Faces Pain Score: 0-No pain Pain Intervention(s): Monitored during session  Home Living  Prior Functioning/Environment              Frequency  Min 2X/week        Progress Toward Goals  OT Goals(current goals can now be found in the care plan section)  Progress towards OT goals: Not progressing toward goals - comment(limited by lethargy this session)  Acute Rehab OT Goals Patient Stated Goal: none stated OT Goal Formulation: Patient unable to participate in goal setting  Plan Discharge plan remains appropriate    Co-evaluation                 AM-PAC PT "6 Clicks" Daily Activity     Outcome Measure   Help from another person eating meals?: A Little Help from another person taking care of personal grooming?: A Little Help from another person toileting, which includes using toliet, bedpan, or urinal?: A Little Help from another person bathing (including washing, rinsing, drying)?: A Little Help from another person to put on and taking off regular upper body clothing?: A Little Help from another person to put on and taking off regular lower body clothing?: A Little 6 Click Score: 18    End of Session Equipment Utilized During Treatment: Oxygen  OT Visit Diagnosis: Unsteadiness on  feet (R26.81);Other abnormalities of gait and mobility (R26.89);Muscle weakness (generalized) (M62.81)   Activity Tolerance Patient limited by lethargy   Patient Left in bed;with call bell/phone within reach;with bed alarm set;with nursing/sitter in room;with family/visitor present   Nurse Communication Mobility status        Time: 5859-2924 OT Time Calculation (min): 14 min  Charges: OT General Charges $OT Visit: 1 Visit OT Treatments $Self Care/Home Management : 8-22 mins  Hulda Humphrey OTR/L [place] 07/28/2017, 5:19 PM

## 2017-07-28 NOTE — Progress Notes (Signed)
PROGRESS NOTE    Jose [name]  FTD:[id number] DOB: March 27, 1951 DOA: 07/14/2017 PCP: Cleophas Dunker, MD    Brief Narrative:  66 year old male who presents with chest pain and dyspnea. He is known to have coronary artery disease, COPD, dyslipidemia, type 2 diabetes mellitus, hypertension, obesity and tobacco abuse. He had chest pain intermittently for last 3 days prior to hospitalization, associated with dyspnea on exertion. Due to his persistent symptoms he was brought to the hospital, he was found in atrial fibrillation and his electrocardiogram showed ST elevations in the inferior lateral leads, his troponin was 20, and he was immediately taken to the cardiac catheterization lab. On his initial physical examination blood pressure 115/57, heart rate 52, temperature 98.9, oxygen saturation 93% his lungs were clear to auscultation bilaterally heart S1-S2 present, irregularly irregular no S3 or S4 gallop, no murmurs, his abdomen was soft nontender, 1+ lower extremity edema  Cardiac catheterization he was found to have total occlusion of the mid and apical LAD, he underwent Unsuccessful PCI to the LAD. He also was found to have chronic occlusion of the RCA, ejection fraction is 40%.   He developed acute kidney injury. He was placed on amiodarone and started on aggressive medical therapy for his coronary artery disease. Likely not a good surgical candidate.  Persistent weakness reported overnight. Bradycardia has resolved after decrease in dose of beta blocker by cardiology. Neuro will be consulted to evaluate persistent weakness. Hyponatremia could be contributing however it is chronic have been ranging 127-130 since a year ago. Hypervolemic. Fluid restriction.   Assessment & Plan:   Principal Problem:   STEMI (ST elevation myocardial infarction) ([place]) Active Problems:   COPD (chronic obstructive pulmonary disease) (HCC)   HTN (hypertension)   Diabetes (HCC)   Morbid obesity (HCC)   OSA  (obstructive sleep apnea)   Chronic diastolic CHF (congestive heart failure) (HCC)   Atrial fibrillation with rapid ventricular response (HCC)   STEMI involving left anterior descending coronary artery (HCC)   Acute on chronic systolic CHF (congestive heart failure), NYHA class 3 (HCC)   Coronary artery disease involving native coronary artery of native heart with unstable angina pectoris (HCC)   PAF (paroxysmal atrial fibrillation) (HCC)   CRI (chronic renal insufficiency), stage 3 (moderate) ([place])   1. Coronary artery disease status post ST elevation myocardial infarction. -aspirin, clopidogrel, metoprolol, hydralazine,isosorbide -reported allergy to statins  2. Atrial fibrillation, new onset. -rate control metoprolol and amiodarone -CHADsVASC score 5 -eliquis for cva prophylaxis  3. Chronic fatigue/weakness/deconditioning -possibly multifactorial from deconditioning vs recent STEMI vs chronic hyponatremia -continue PT  -monitor Na+ level  4. Chronic hyponatremia, hypervolemic -Baseline 127-130 since a year ago\ -Na+125 -Fluid restriction less than 1200 cc per day -monitor urine output -repeat BMP am, if no improvement consult nephrology  5. Systolic heart failure, acute on chronic,LV EF 40%, with akinesis of the distal inferior-septal wall and apex. -fluid restriction -strict I&O's daily weight -continue core measures asa, metoprolol, allergic to statin  6. Acute kidney injury on chronic kidney disease stage III. -improved -cr 1.77 from 1.93 -baseline cr.1.44 -avoid nephrotoxic med -BMP am  7. Chronic normocytic anemia -stable -no sign of bleeding -hg 10.7 mcv 85 -ferrous sulfate -cbc am  8. Type 2 diabetes mellitus. -basal insulin to 40 units bid, 8 units of aspart ac.  -ISS with hypoglycemic protocol -A1C 7.9 (07/15/17)  9. HTN. -stable -metoprolol, hydralazine and isosorbide.   10. COPD. -No  acuteexacerbation. -albuterolandtiotropium.  11. Chronic depression/anxiety -zoloft -ativan IV  1 mg prn  12. Chronic constipation -miralax  13. OSA -CPAP at night  DVT prophylaxis:apixaban Code Status:full Family Communication:wife at bedside.  Disposition Plan:Will stay another midnight until symptoms have improved, close monitoring and improvement of hyponatremia.   Consultants:  [facility]  Neurology  Procedures:  Cardiac catheterization    Objective: Vitals:   07/27/17 2104 07/27/17 2114 07/27/17 2308 07/28/17 0610  BP:  (!) 139/56 140/75 131/84  Pulse:  66  62  Resp:  12 13 (!) 21  Temp:  (!) 97.2 F (36.2 C)  97.6 F (36.4 C)  TempSrc:  Axillary  Oral  SpO2: 95% 100% 100% 100%  Weight:    130.4 kg (287 lb 7.7 oz)  Height:        Intake/Output Summary (Last 24 hours) at 07/28/2017 0824 Last data filed at 07/27/2017 1822 Gross per 24 hour  Intake 720 ml  Output 450 ml  Net 270 ml   Filed Weights   07/26/17 0532 07/27/17 0244 07/28/17 0610  Weight: 128.5 kg (283 lb 4.8 oz) 133 kg (293 lb 3.4 oz) 130.4 kg (287 lb 7.7 oz)    Examination:   General: 66 yo CM obese, NAD. A&O x3 Neurology: Awake and alert, non focal  EENT: mild pallor, no icterus, oral mucosa moist Cardiovascular: No JVD. S1-S2 present, rhythmic, no gallops, rubs, or murmurs. 2+ pitting edema LE bilaterally. Pulmonary: mild decreased breath sounds bilaterally at bases, adequate air movement, no wheezing, rhonchi or rales. Gastrointestinal. Abdomen morbidly obese, no organomegaly, non tender, no rebound or guarding Skin. No open lesions noted. Musculoskeletal: no joint deformities     Data Reviewed: I have personally reviewed following labs and imaging studies  CBC: Recent Labs  Lab 07/27/17 0405 07/28/17 0247  WBC 8.9 9.8  HGB 10.2* 10.8*  HCT 31.7* 33.8*  MCV 86.1 85.6  PLT 227 093   Basic Metabolic Panel: Recent Labs  Lab 07/24/17 0205 07/25/17 0220  07/26/17 0213 07/26/17 2140 07/27/17 0405 07/28/17 0247  NA 126* 128* 125*  --  126* 125*  K 4.2 4.5 4.3  --  4.1 4.2  CL 80* 84* 83*  --  86* 85*  CO2 34* 33* 31  --  31 29  GLUCOSE 293* 199* 284* 283* 173* 178*  BUN 36* 41* 43*  --  42* 43*  CREATININE 1.70* 1.89* 1.93*  --  1.77* 1.77*  CALCIUM 9.1 8.9 8.7*  --  8.7* 8.7*  MG 1.8  --   --   --   --   --    GFR: Estimated Creatinine Clearance: 58.1 mL/min (A) (by C-G formula based on SCr of 1.77 mg/dL (H)). Liver Function Tests: No results for input(s): AST, ALT, ALKPHOS, BILITOT, PROT, ALBUMIN in the last 168 hours. No results for input(s): LIPASE, AMYLASE in the last 168 hours. No results for input(s): AMMONIA in the last 168 hours. Coagulation Profile: No results for input(s): INR, PROTIME in the last 168 hours. Cardiac Enzymes: Recent Labs  Lab 07/24/17 0952  CKTOTAL 124   BNP (last 3 results) No results for input(s): PROBNP in the last 8760 hours. HbA1C: No results for input(s): HGBA1C in the last 72 hours. CBG: Recent Labs  Lab 07/27/17 1107 07/27/17 1631 07/27/17 2113 07/28/17 0619 07/28/17 0808  GLUCAP 259* 222* 262* 167* 204*   Lipid Profile: No results for input(s): CHOL, HDL, LDLCALC, TRIG, CHOLHDL, LDLDIRECT in the last 72 hours. Thyroid Function Tests: No results for input(s): TSH, T4TOTAL, FREET4, T3FREE, THYROIDAB  in the last 72 hours. Anemia Panel: No results for input(s): VITAMINB12, FOLATE, FERRITIN, TIBC, IRON, RETICCTPCT in the last 72 hours.    Radiology Studies: I have reviewed all of the imaging during this hospital visit personally     Scheduled Meds: . amiodarone  200 mg Oral BID  . amLODipine  2.5 mg Oral Daily  . apixaban  5 mg Oral BID  . aspirin EC  81 mg Oral Daily  . chlorhexidine  15 mL Mouth Rinse BID  . clopidogrel  75 mg Oral Daily  . ferrous sulfate  325 mg Oral Q breakfast  . hydrALAZINE  50 mg Oral Q8H  . Influenza vac split quadrivalent PF  0.5 mL  Intramuscular Tomorrow-1000  . insulin aspart  0-5 Units Subcutaneous QHS  . insulin aspart  0-9 Units Subcutaneous TID WC  . insulin aspart  8 Units Subcutaneous TID WC  . insulin glargine  40 Units Subcutaneous BID  . isosorbide mononitrate  30 mg Oral Daily  . magnesium oxide  400 mg Oral Daily  . mouth rinse  15 mL Mouth Rinse q12n4p  . metoprolol succinate  12.5 mg Oral BID  . omega-3 acid ethyl esters  2 g Oral Q breakfast  . omega-3 acid ethyl esters  3 g Oral BID AC  . polyethylene glycol  17 g Oral BID  . sertraline  100 mg Oral Daily  . tiotropium  18 mcg Inhalation QHS   Continuous Infusions:   LOS: 14 days        Kayleen Memos, MD [facility] Pager [phone]

## 2017-07-28 NOTE — Progress Notes (Signed)
RT placed pt on pt home CPAP with home settings preset and  2L bled in. RT educated pts wife that if pt removes mask he will not be connected to any o2 and sats would drop. PT hooked to monitor and sats are 95%. RT to cont monitor.

## 2017-07-28 NOTE — Care Management Important Message (Signed)
Important Message  Patient Details  Name: Jose [name] MRN: [id number] Date of Birth: 1951/06/06   Medicare Important Message Given:  Yes    Nathen May 07/28/2017, 8:52 AM

## 2017-07-29 ENCOUNTER — Inpatient Hospital Stay (HOSPITAL_COMMUNITY): Payer: Medicare Other

## 2017-07-29 DIAGNOSIS — E871 Hypo-osmolality and hyponatremia: Secondary | ICD-10-CM

## 2017-07-29 DIAGNOSIS — I6381 Other cerebral infarction due to occlusion or stenosis of small artery: Secondary | ICD-10-CM

## 2017-07-29 DIAGNOSIS — G9340 Encephalopathy, unspecified: Secondary | ICD-10-CM

## 2017-07-29 DIAGNOSIS — I6522 Occlusion and stenosis of left carotid artery: Secondary | ICD-10-CM

## 2017-07-29 LAB — BLOOD GAS, ARTERIAL
Acid-Base Excess: 5.1 mmol/L — ABNORMAL HIGH (ref 0.0–2.0)
Bicarbonate: 28.8 mmol/L — ABNORMAL HIGH (ref 20.0–28.0)
Drawn by: 52077
O2 CONTENT: 2 L/min
O2 SAT: 95.3 %
PCO2 ART: 40.3 mmHg (ref 32.0–48.0)
PO2 ART: 102 mmHg (ref 83.0–108.0)
Patient temperature: 98.6
pH, Arterial: 7.468 — ABNORMAL HIGH (ref 7.350–7.450)

## 2017-07-29 LAB — GLUCOSE, CAPILLARY
GLUCOSE-CAPILLARY: 126 mg/dL — AB (ref 65–99)
GLUCOSE-CAPILLARY: 120 mg/dL — AB (ref 65–99)
Glucose-Capillary: 104 mg/dL — ABNORMAL HIGH (ref 65–99)
Glucose-Capillary: 126 mg/dL — ABNORMAL HIGH (ref 65–99)
Glucose-Capillary: 134 mg/dL — ABNORMAL HIGH (ref 65–99)

## 2017-07-29 LAB — BASIC METABOLIC PANEL
Anion gap: 11 (ref 5–15)
BUN: 48 mg/dL — AB (ref 6–20)
CALCIUM: 8.7 mg/dL — AB (ref 8.9–10.3)
CO2: 29 mmol/L (ref 22–32)
Chloride: 84 mmol/L — ABNORMAL LOW (ref 101–111)
Creatinine, Ser: 2.25 mg/dL — ABNORMAL HIGH (ref 0.61–1.24)
GFR calc Af Amer: 33 mL/min — ABNORMAL LOW (ref >60)
GFR, EST NON AFRICAN AMERICAN: 29 mL/min — AB (ref >60)
GLUCOSE: 103 mg/dL — AB (ref 65–99)
Potassium: 4.4 mmol/L (ref 3.5–5.1)
Sodium: 124 mmol/L — ABNORMAL LOW (ref 135–145)

## 2017-07-29 MED ORDER — EZETIMIBE 10 MG PO TABS
10.0000 mg | ORAL_TABLET | Freq: Every day | ORAL | Status: DC
  Administered 2017-07-30 – 2017-08-09 (×10): 10 mg via ORAL
  Filled 2017-07-29 (×11): qty 1

## 2017-07-29 MED ORDER — PRAVASTATIN SODIUM 40 MG PO TABS: 80.0000 mg | ORAL_TABLET | Freq: Every day | ORAL | Status: DC

## 2017-07-29 MED ORDER — SODIUM CHLORIDE 0.9 % IN NEBU
3.0000 mL | INHALATION_SOLUTION | Freq: Two times a day (BID) | RESPIRATORY_TRACT | Status: DC
  Administered 2017-07-29 – 2017-08-02 (×9): 3 mL via RESPIRATORY_TRACT
  Filled 2017-07-29 (×13): qty 3

## 2017-07-29 MED ORDER — PRAVASTATIN SODIUM 20 MG PO TABS
20.0000 mg | ORAL_TABLET | Freq: Every day | ORAL | Status: DC
  Administered 2017-07-30 – 2017-08-08 (×10): 20 mg via ORAL
  Filled 2017-07-29 (×11): qty 1

## 2017-07-29 NOTE — Progress Notes (Addendum)
Tx performed and charted by SPTA under direct guidance and supervision of PTA at all times.  Charting reviewed for accuracy and depicts tx performed and services provided.  [name], PTA pager [phone]   Physical Therapy Treatment Patient Details Name: Jose [name] MRN: [id number] DOB: 09-18-1950 Today's Date: 07/29/2017    History of Present Illness Pt is a 66 y.o. male who presented to [facility] on 07/14/17 with chest pain and dyspnea. On arrival to [facility] ED, his EKG showed new onset A-fib with ST elevation in the inferolateral leads; CODE STEMI was called. Cardiac cath 11/15 with unsuccessful attempt at revascularization of occluded distal LAD, chronic occlusion of RCA. MRI on 07/28/17 revealed Subcentimeter acute infarction in left parietal periventricular white matter Pertinent PMH includes CAD (CTO of RCA, with residual disease in LAD/Lcx), COPD, HL, HTN, obesity,DMand tobacco use.     PT Comments    Pt in recliner upon arrival. Pt was reluctant to walk but, son encouraged him. Pt min assist for power up from chair. Multiple VCs to push off from chair;pt continued to stand up with both hands on RW. SPTA kept walker from tipping. Pt's gait speed very decreased and slightly unsteady. VCs to pick up feet, stand closer to walker, and for posture. Pt would close eyes during session and would have to be roused. Pt answered with incorrect words several times throughout session. Pt stated he felt slightly dizzy going from sitting to standing. Lying BP 146/79. Seated BP 146/74. Standing BP 120/95. Pt would benefit from skilled PT to increase endurance and functional mobility. Spoke with supervising PT and she agrees pt discharge plan needs to be updated to SNF.  Follow Up Recommendations  Supervision for mobility/OOB;SNF     Equipment Recommendations  None recommended by PT    Recommendations for Other Services OT consult     Precautions / Restrictions Precautions Precautions:  Fall Precaution Comments: , syncope/ pre syncope Restrictions Weight Bearing Restrictions: No    Mobility  Bed Mobility Overal bed mobility: Needs Assistance Bed Mobility: Supine to Sit;Sit to Supine Rolling: Min assist   Supine to sit: Mod assist Sit to supine: Mod assist;+2 for physical assistance   General bed mobility comments: Pt required VCs for sequencing with all bed mobility. Mod assist to elevate trunk from elevated HOB. Mod assist for LE management.   Transfers Overall transfer level: Needs assistance Equipment used: Rolling walker (2 wheeled) Transfers: Sit to/from Stand Sit to Stand: Min assist;+2 physical assistance         General transfer comment: Multiple VCs to reach back for bed/chair or to push off from bed/chair. Pt transferring with both hands on RW; when corrected and asked to place hands on the chair pt stated "my hands are on the chair." Min assist for power up and for safety/  Ambulation/Gait Ambulation/Gait assistance: Min assist;+2 physical assistance(+3 chair follow) Ambulation Distance (Feet): 10 Feet Assistive device: Rolling walker (2 wheeled) Gait Pattern/deviations: Step-to pattern;Wide base of support;Shuffle Gait velocity: Decreased   General Gait Details: Pt unsteady on feet. Wide BOS; cues to narrow BOS to be in RW.  VCs to stand closer to RW and for posture. Pt has a slow shuffling gait.    Stairs            Wheelchair Mobility    Modified Rankin (Stroke Patients Only)       Balance Overall balance assessment: Needs assistance Sitting-balance support: Feet supported;Bilateral upper extremity supported Sitting balance-Leahy Scale: Fair  Postural control: Left lateral lean Standing balance support: Bilateral upper extremity supported Standing balance-Leahy Scale: Poor Standing balance comment: Relied on RW support this session.                             Cognition Arousal/Alertness: Awake/alert Behavior  During Therapy: Flat affect(impulsive) Overall Cognitive Status: Impaired/Different from baseline Area of Impairment: Attention;Following commands;Awareness;Problem solving                   Current Attention Level: Sustained   Following Commands: Follows one step commands with increased time;Follows one step commands inconsistently     Problem Solving: Slow processing;Decreased initiation;Requires verbal cues        Exercises      General Comments        Pertinent Vitals/Pain Pain Assessment: No/denies pain Faces Pain Scale: Hurts even more Pain Intervention(s): Monitored during session    Home Living                      Prior Function            PT Goals (current goals can now be found in the care plan section) Acute Rehab PT Goals Patient Stated Goal: none stated PT Goal Formulation: With patient Time For Goal Achievement: 08/01/17 Potential to Achieve Goals: Good Progress towards PT goals: Progressing toward goals    Frequency    Min 3X/week      PT Plan Current plan remains appropriate    Co-evaluation              AM-PAC PT "6 Clicks" Daily Activity  Outcome Measure  Difficulty turning over in bed (including adjusting bedclothes, sheets and blankets)?: None Difficulty moving from lying on back to sitting on the side of the bed? : Unable Difficulty sitting down on and standing up from a chair with arms (e.g., wheelchair, bedside commode, etc,.)?: Unable Help needed moving to and from a bed to chair (including a wheelchair)?: A Lot Help needed walking in hospital room?: A Lot Help needed climbing 3-5 steps with a railing? : Total 6 Click Score: 11    End of Session Equipment Utilized During Treatment: Gait belt;Oxygen Activity Tolerance: Patient limited by fatigue Patient left: in bed;with call bell/phone within reach;with family/visitor present Nurse Communication: Mobility status;Other (comment) PT Visit Diagnosis: Other  abnormalities of gait and mobility (R26.89)     Time: 1275-1700 PT Time Calculation (min) (ACUTE ONLY): 34 min  Charges:  $Gait Training: 8-22 mins $Therapeutic Activity: 8-22 mins                    G Codes:       Janna Arch, SPTA   Janna Arch 07/29/2017, 5:01 PM

## 2017-07-29 NOTE — Progress Notes (Addendum)
Stroke Team Progress Note  SUBJECTIVE  History is obtained from the patient's wife. Patient presented with chest pain and generalized weakness for several days. He denied any focal extremity weakness to suggest stroke but MRI scan shows a tiny left periventricular white matter infarct. He has known history of chronic left carotid occlusion following a stroke a year ago.  OBJECTIVE Most recent Vital Signs: Temp: 98 F (36.7 C) (11/30 0318) Temp Source: Axillary (11/30 0318) BP: 159/87 (11/30 0804) Pulse Rate: 85 (11/30 0804) Respiratory Rate: 18 O2 Saturdation: 92%  CBG (last 3)  Recent Labs    07/29/17 0630 07/29/17 0752 07/29/17 1127  GLUCAP 104* 126* 126*    Diet: Diet heart healthy/carb modified Room service appropriate? Yes; Fluid consistency: Thin; Fluid restriction: 1200 mL Fluid    Activity: Up with assistance   VTE Prophylaxis:  scds  Studies: Results for orders placed or performed during the hospital encounter of 07/14/17 (from the past 24 hour(s))  Glucose, capillary     Status: Abnormal   Collection Time: 07/28/17  4:38 PM  Result Value Ref Range   Glucose-Capillary 168 (H) 65 - 99 mg/dL   Comment 1 Notify RN   Glucose, capillary     Status: Abnormal   Collection Time: 07/28/17  8:37 PM  Result Value Ref Range   Glucose-Capillary 117 (H) 65 - 99 mg/dL  Basic metabolic panel     Status: Abnormal   Collection Time: 07/29/17  2:32 AM  Result Value Ref Range   Sodium 124 (L) 135 - 145 mmol/L   Potassium 4.4 3.5 - 5.1 mmol/L   Chloride 84 (L) 101 - 111 mmol/L   CO2 29 22 - 32 mmol/L   Glucose, Bld 103 (H) 65 - 99 mg/dL   BUN 48 (H) 6 - 20 mg/dL   Creatinine, Ser 2.25 (H) 0.61 - 1.24 mg/dL   Calcium 8.7 (L) 8.9 - 10.3 mg/dL   GFR calc non Af Amer 29 (L) >60 mL/min   GFR calc Af Amer 33 (L) >60 mL/min   Anion gap 11 5 - 15  Glucose, capillary     Status: Abnormal   Collection Time: 07/29/17  6:30 AM  Result Value Ref Range   Glucose-Capillary 104 (H) 65 -  99 mg/dL  Glucose, capillary     Status: Abnormal   Collection Time: 07/29/17  7:52 AM  Result Value Ref Range   Glucose-Capillary 126 (H) 65 - 99 mg/dL  Glucose, capillary     Status: Abnormal   Collection Time: 07/29/17 11:27 AM  Result Value Ref Range   Glucose-Capillary 126 (H) 65 - 99 mg/dL     Mr [facility] Wo Contrast  Result Date: 07/29/2017 CLINICAL DATA:  Acute presentation with weakness and shortness of breath. Atrial fibrillation. MRI yesterday demonstrated acute infarction in the left parietal periventricular white matter. EXAM: MRA HEAD WITHOUT CONTRAST TECHNIQUE: Angiographic images of the Circle of Willis were obtained using MRA technique without intravenous contrast. COMPARISON:  03/17/2016 FINDINGS: Patient has developed occlusion of the left internal carotid artery in the neck with absence of antegrade flow through the skullbase and siphon region. Reconstituted flow is noted in the supraclinoid ICA with present but diminished flow in the left anterior and middle cerebral artery territories. There appears to be a patent anterior communicating artery and there may be a small patent posterior communicating artery. On the right, the internal carotid artery is patent through the skullbase and siphon region. There is fetal origin of  the right PCA which appears widely patent. Anterior and middle cerebral arteries are patent. No MCA stenosis seen on the right. I think there is moderate stenosis of the A1 segment on the right. Both vertebral arteries are patent at the foramen magnum. Left vertebral artery is dominant. There is focal stenosis of the right vertebral artery at foramen magnum, approximately 70%. Both posterior inferior cerebellar arteries show flow. No basilar stenosis. Posterior circulation branch vessels are patent. IMPRESSION: Interval occlusion of the left internal carotid artery with absence of antegrade flow at the skullbase. Diminished but present flow in the anterior  circulation on the left probably secondary to patent anterior and posterior communicating arteries. Stenosis of the A1 segment on the right, additional importance based on the relevance to collateral flow to the left hemisphere. Approximately 70% stenosis of the right vertebral artery at the foramen magnum level. Antegrade flow does persist. Dominant left vertebral artery freely supplies the basilar. Electronically Signed   By: Nelson Chimes M.D.   On: 07/29/2017 11:34   Mr Brain Wo Contrast  Result Date: 07/28/2017 CLINICAL DATA:  66 y/o M; Focal neuro deficit, > 6 hrs, stroke suspected. EXAM: MRI HEAD WITHOUT CONTRAST TECHNIQUE: Axial DWI and axial T2 FLAIR propeller sequences were acquired. Patient was unable to continue the examination and additional sequences were not acquired. COMPARISON:  03/17/2016 MRI of the head. FINDINGS: Subcentimeter focus of reduced diffusion in left parietal periventricular white matter (series 3, image 36). No mass effect or gross hemorrhage. Stable chronic microvascular ischemic changes and parenchymal volume loss of the brain. No abnormal extra-axial FLAIR signal to suggest hemorrhage. No herniation or effacement of basilar cisterns. Small right maxillary sinus mucous retention cyst. IMPRESSION: 1. Diffusion and axial T2 FLAIR sequences were acquired. 2. Subcentimeter acute infarction in left parietal periventricular white matter. No mass effect or gross hemorrhage. 3. Stable chronic microvascular ischemic changes and parenchymal volume loss of the brain. These results will be called to the ordering clinician or representative by the Radiologist Assistant, and communication documented in the PACS or zVision Dashboard. Electronically Signed   By: Kristine Garbe M.D.   On: 07/28/2017 22:36    Physical Exam:    Obese middle aged Caucasian male not in distress. . Afebrile. Head is nontraumatic. Neck is supple without bruit.    Cardiac exam no murmur or gallop. Lungs  are clear to auscultation. Distal pulses are well felt. Neurological Exam ;  Awake  Alert oriented x 3. Normal speech and language.eye movements full without nystagmus.fundi were not visualized. Vision acuity and fields appear normal. Hearing is normal. Palatal movements are normal. Face symmetric. Tongue midline. Normal strength, tone, reflexes and coordination. Normal sensation. Gait deferred. ASSESSMENT [name] is a 66 y.o. male with a small clinically silent left periventricular white matter infarct secondary to either loss of collateral flow in the setting of acute myocardial infarction and hypertension versus atrial fibrillation and cardioembolic etiology, on aspirin 81 mg daily, clopidogrel 75 mg daily and Eliquis (apixaban) daily for secondary stroke prevention,    Recommend : Continue Ongoing cardiac workup for myocardial infarction. Avoid hypotension and keep systolic blood pressure in 120-140 range. Maintain adequate hydration Continue eliquis for stroke prevention. Aggressive risk factor modification with strict control of hypertension with blood pressure goal below 130/90, lipids with LDL cholesterol goal below 70 mg percent and diabetes with hemoglobin A1c goal below 6.5%. Physical occupational speech therapy consults. Mobilize out of bed. Rehabilitation consult if necessary. Long discussion with  patient and wife at the bedside and answered questions. Stroke team will sign off. Kindly call for questions. Greater than 50% time during this 25 minute visit was spent on counseling and coordination of care about his stroke and answering questions  Hospital day # [place], MD [facility] Pager: [phone] 07/29/2017 3:15 PM

## 2017-07-29 NOTE — NC FL2 (Signed)
[place] LEVEL OF CARE SCREENING TOOL     IDENTIFICATION  Patient Name: Jose [name] Birthdate: September 27, 1950 Sex: male Admission Date (Current Location): 07/14/2017  [facility] and [place] Number:  [employer] and Address:  [facility] [place], [place] [address] [place], [place] [address]      Provider Number: [id number]  Attending Physician Name and Address:  Martinique, Peter M, MD  Relative Name and Phone Number:  Owais Pruett, spouse, [phone]    Current Level of Care: Hospital Recommended Level of Care: [place] Prior Approval Number:    Date Approved/Denied:   PASRR Number: [id number]  Discharge Plan: SNF    Current Diagnoses: Patient Active Problem List   Diagnosis Date Noted  . CRI (chronic renal insufficiency), stage 3 (moderate) (HCC)   . Acute on chronic systolic CHF (congestive heart failure), NYHA class 3 ([place])   . Coronary artery disease involving native coronary artery of native heart with unstable angina pectoris ([place])   . PAF (paroxysmal atrial fibrillation) ([place])   . STEMI (ST elevation myocardial infarction) ([place]) 07/14/2017  . Atrial fibrillation with rapid ventricular response ([place]) 07/14/2017  . STEMI involving left anterior descending coronary artery ([place]) 07/14/2017  . CAD-failed PCI attempt Dec 2015- medical Rx 03/23/2016  . Chronic diastolic CHF (congestive heart failure) ([place])   . Hyperkalemia   . AKI (acute kidney injury) ([place])   . Carotid stenosis, symptomatic w/o infarct   . CHF (congestive heart failure) ([place])   . Hemispheric carotid artery syndrome   . HLD (hyperlipidemia)   . Essential hypertension   . Uncontrolled type 2 diabetes mellitus with circulatory disorder ([place])   . Hyperglycemia   . TIA (transient ischemic attack) 03/16/2016  . Accelerated hypertension 02/25/2016  . Acute diastolic CHF (congestive heart failure) ([place]) 02/24/2016  . COPD (chronic obstructive pulmonary  disease) ([place]) 02/24/2016  . Acute respiratory failure with hypoxia ([place]) 02/24/2016  . HTN (hypertension) 02/24/2016  . Diabetes ([place]) 02/24/2016  . Morbid obesity ([place]) 02/24/2016  . OSA (obstructive sleep apnea) 02/24/2016  . Elevated troponin 02/24/2016  . ACS (acute coronary syndrome) ([place]) 08/27/2014    Orientation RESPIRATION BLADDER Height & Weight     Self, Situation, Place  O2, Other (Comment)(nasal cannula 4L/ CPAP) Incontinent Weight: 287 lb 14.7 oz (130.6 kg) Height:  6\' 1"  (185.4 cm)  BEHAVIORAL SYMPTOMS/MOOD NEUROLOGICAL BOWEL NUTRITION STATUS      Continent Diet(heart healthy/carb modified)  AMBULATORY STATUS COMMUNICATION OF NEEDS Skin   Extensive Assist Verbally PU Stage and Appropriate Care(diabetic ulcer L foot, foam)                       Personal Care Assistance Level of Assistance  Bathing, Feeding, Dressing Bathing Assistance: Limited assistance Feeding assistance: Independent Dressing Assistance: Limited assistance     Functional Limitations Info  Sight, Hearing, Speech Sight Info: Adequate Hearing Info: Adequate Speech Info: Adequate    SPECIAL CARE FACTORS FREQUENCY  PT (By licensed PT), OT (By licensed OT)     PT Frequency: 5x/week OT Frequency: 5x/week            Contractures Contractures Info: Present    Additional Factors Info  Code Status, Allergies, Psychotropic, Insulin Sliding Scale Code Status Info: Full Allergies Info: Atorvastatin, Simvastatin Psychotropic Info: zoloft Insulin Sliding Scale Info: insulin novolog 3x/day with meals and novolog at bedtime; insulin lantus 2x/day       Current Medications (07/29/2017):  This is the current hospital active medication list Current Facility-Administered Medications  Medication Dose Route Frequency Provider Last Rate Last Dose  . acetaminophen (TYLENOL) tablet 650 mg  650 mg Oral Q4H PRN Reino Bellis B, NP      . albuterol (PROVENTIL) (2.5 MG/3ML) 0.083% nebulizer  solution 2.5 mg  2.5 mg Nebulization Q4H PRN Croitoru, Mihai, MD      . amiodarone (PACERONE) tablet 200 mg  200 mg Oral BID Croitoru, Mihai, MD   200 mg at 07/28/17 2142  . amLODipine (NORVASC) tablet 2.5 mg  2.5 mg Oral Daily Jettie Booze, MD   2.5 mg at 07/28/17 0941  . apixaban (ELIQUIS) tablet 5 mg  5 mg Oral BID Alphonzo Grieve, MD   5 mg at 07/28/17 2142  . aspirin EC tablet 81 mg  81 mg Oral Daily Reino Bellis B, NP   81 mg at 07/28/17 0940  . clopidogrel (PLAVIX) tablet 75 mg  75 mg Oral Daily Reino Bellis B, NP   75 mg at 07/28/17 0941  . ferrous sulfate tablet 325 mg  325 mg Oral Q breakfast Alphonzo Grieve, MD   325 mg at 07/29/17 0804  . hydrALAZINE (APRESOLINE) tablet 50 mg  50 mg Oral Q8H Alphonzo Grieve, MD   50 mg at 07/29/17 0630  . Influenza vac split quadrivalent PF (FLUZONE HIGH-DOSE) injection 0.5 mL  0.5 mL Intramuscular Tomorrow-1000 Martinique, Peter M, MD      . insulin aspart (novoLOG) injection 0-5 Units  0-5 Units Subcutaneous QHS Ledell Noss, MD   3 Units at 07/27/17 2221  . insulin aspart (novoLOG) injection 0-9 Units  0-9 Units Subcutaneous TID WC Ledell Noss, MD   2 Units at 07/28/17 1701  . insulin aspart (novoLOG) injection 8 Units  8 Units Subcutaneous TID WC Arrien, Jimmy Picket, MD   8 Units at 07/29/17 0802  . insulin glargine (LANTUS) injection 40 Units  40 Units Subcutaneous BID Tawni Millers, MD   40 Units at 07/28/17 2142  . isosorbide mononitrate (IMDUR) 24 hr tablet 30 mg  30 mg Oral Daily Ledell Noss, MD   30 mg at 07/28/17 0941  . magnesium oxide (MAG-OX) tablet 400 mg  400 mg Oral Daily Reino Bellis B, NP   400 mg at 07/28/17 0941  . metoprolol succinate (TOPROL-XL) 24 hr tablet 12.5 mg  12.5 mg Oral BID Chriss Czar, MD   12.5 mg at 07/29/17 0805  . morphine 4 MG/ML injection 2 mg  2 mg Intravenous Q4H PRN Reino Bellis B, NP   2 mg at 07/29/17 2778  . nitroGLYCERIN (NITROSTAT) SL tablet 0.4 mg  0.4 mg Sublingual Q5 Min  x 3 PRN Reino Bellis B, NP      . omega-3 acid ethyl esters (LOVAZA) capsule 2 g  2 g Oral Q breakfast Reino Bellis B, NP   2 g at 07/29/17 0805  . omega-3 acid ethyl esters (LOVAZA) capsule 3 g  3 g Oral BID AC Martinique, Peter M, MD   3 g at 07/28/17 1236  . ondansetron (ZOFRAN) injection 4 mg  4 mg Intravenous Q6H PRN Cheryln Manly, NP      . oxyCODONE-acetaminophen (PERCOCET/ROXICET) 5-325 MG per tablet 2 tablet  2 tablet Oral Q4H PRN Belva Crome, MD   2 tablet at 07/29/17 0809  . polyethylene glycol (MIRALAX / GLYCOLAX) packet 17 g  17 g Oral BID Alphonzo Grieve, MD   17 g at 07/28/17 2141  .  pravastatin (PRAVACHOL) tablet 80 mg  80 mg Oral q1800 Costello, Mary A, NP      . sertraline (ZOLOFT) tablet 100 mg  100 mg Oral Daily Reino Bellis B, NP   100 mg at 07/28/17 0941  . sodium chloride 0.9 % nebulizer solution 3 mL  3 mL Nebulization BID Irene Pap N, DO      . tiotropium [facility]) inhalation capsule 18 mcg  18 mcg Inhalation QHS Reino Bellis B, NP   18 mcg at 07/28/17 2041     Discharge Medications: Please see discharge summary for a list of discharge medications.  Relevant Imaging Results:  Relevant Lab Results:   Additional Information SSN: [id number]  Estanislado Emms, LCSW

## 2017-07-29 NOTE — Progress Notes (Signed)
Pt was medicated prior to MRI for lower back pain

## 2017-07-29 NOTE — Progress Notes (Signed)
Pt back from MRI around 11am. Pt came back slurring words and hoarse voice. Stated it was "dry mouth" and he couldn't "cough up". Attending MD at bedside, NPO until seen my speech therapy. Will continue to monitor.

## 2017-07-29 NOTE — Clinical Social Work Note (Signed)
Clinical Social Work Assessment  Patient Details  Name: Jose [name] MRN: [id number] Date of Birth: [birth year]  Date of referral:  07/29/17               Reason for consult:  Facility Placement                Permission sought to share information with:  Facility Sport and exercise psychologist, Family Supports Permission granted to share information::     Name::     Jose [name]  Agency::  SNFs  Relationship::  spouse  Contact Information:  [phone]  Housing/Transportation Living arrangements for the past 2 months:  Single Family Home Source of Information:  Spouse Patient Interpreter Needed:  None Criminal Activity/Legal Involvement Pertinent to Current Situation/Hospitalization:  No - Comment as needed Significant Relationships:  Spouse Lives with:  Spouse Do you feel safe going back to the place where you live?  Yes Need for family participation in patient care:  Yes (Comment)  Care giving concerns: Patient from home with wife.   Social Worker assessment / plan: CSW met with spouse outside of patient's room. Patient receiving care from other staff members and wife with concern about discussing disposition planning with patient at this time. Wife indicates she will not be able to manage caring for patient at home in patient's current state (transfers, lifting, etc) and is hopeful for short term rehab placement so patient can increase strength and return home. Wife indicated PTA patient was independent with RW and could manage ADLs. Wife prefers [facility] in [place], though is open to other facilities. CSW and wife spoke to OT about concerns. CSW sent out initial referrals and will follow for disposition planning pending continued medical workup and updated PT/OT recommendations.  Employment status:  Retired Health visitor PT Recommendations:  Home with [employer], [place] / Referral to community resources:  [place]  Patient/Family's Response to care: Wife appreciative of care.  Patient/Family's Understanding of and Emotional Response to Diagnosis, Current Treatment, and Prognosis: Wife with good understanding of patient's condition and hopeful for ST SNF placement.  Emotional Assessment Appearance:  Appears stated age Attitude/Demeanor/Rapport:  Unable to Assess Affect (typically observed):  Unable to Assess Orientation:  Oriented to Self, Oriented to Place, Oriented to Situation Alcohol / Substance use:  Not Applicable Psych involvement (Current and /or in the community):  No (Comment)  Discharge Needs  Concerns to be addressed:  Discharge Planning Concerns Readmission within the last 30 days:  No Current discharge risk:  Physical Impairment Barriers to Discharge:  Continued Medical Work up   Estanislado Emms, LCSW 07/29/2017, 12:00 PM

## 2017-07-29 NOTE — Progress Notes (Signed)
Occupational Therapy Treatment Patient Details Name: Jose [name] MRN: [id number] DOB: [birth year] Today's Date: 07/29/2017    History of present illness Pt is a 66 y.o. male who presented to [facility] on 07/14/17 with chest pain and dyspnea. On arrival to [facility] ED, his EKG showed new onset A-fib with ST elevation in the inferolateral leads; CODE STEMI was called. Cardiac cath 11/15 with unsuccessful attempt at revascularization of occluded distal LAD, chronic occlusion of RCA. MRI on 07/28/17 revealed Subcentimeter acute infarction in left parietal periventricular white matter Pertinent PMH includes CAD (CTO of RCA, with residual disease in LAD/Lcx), COPD, HL, HTN, obesity,DMand tobacco use.    OT comments  MRI revealed new stroke in left parietal periventricular white matter. Pt presents with increased deficits in cognition, balance, and activity tolerance. New goals to be established next session for OT. Pt is very strong, but movements are uncoordinated. He moves very fast and is not following commands well. He cold almost be described as impulsive. OT updated d/c recommendations to SNF as he will require more intense therapy than HHOT can provide to return to PLOF and maximize safety and independence in ADL and functional transfers.   Follow Up Recommendations  SNF;Supervision/Assistance - 24 hour    Equipment Recommendations  Other (comment)(defer to next venue)    Recommendations for Other Services      Precautions / Restrictions Precautions Precautions: Fall Precaution Comments: , syncope/ pre syncope Restrictions Weight Bearing Restrictions: No       Mobility Bed Mobility Overal bed mobility: Needs Assistance Bed Mobility: Supine to Sit     Supine to sit: Min guard     General bed mobility comments: Pt able to come EOB without assist, movements are very quick - almost impulsive  Transfers Overall transfer level: Needs assistance Equipment used: Rolling walker (2  wheeled) Transfers: Sit to/from [facility] Sit to Stand: Mod assist;+2 safety/equipment(vc for safe hand placement, mod A for boost) Stand pivot transfers: Min assist;+2 safety/equipment(verbal cues for safety and sequencing with RW)       General transfer comment: movements are very quick - almost impulsive    Balance Overall balance assessment: Needs assistance Sitting-balance support: Feet supported;Bilateral upper extremity supported Sitting balance-Leahy Scale: Fair Sitting balance - Comments: Pt able to sit EOB for BUE MMT, Pt often leaning to his left and leaning on LUE Postural control: Left lateral lean Standing balance support: Bilateral upper extremity supported;No upper extremity supported;During functional activity Standing balance-Leahy Scale: Poor Standing balance comment: Relied on RW support this session.                            ADL either performed or assessed with clinical judgement   ADL Overall ADL's : Needs assistance/impaired     Grooming: Wash/dry hands;Wash/dry face;Set up;Sitting Grooming Details (indicate cue type and reason): Pt dropped washcloth and was unaware while performing                 Toilet Transfer: Moderate assistance;+2 for safety/equipment;Stand-pivot;RW;Cueing for sequencing;Cueing for safety Toilet Transfer Details (indicate cue type and reason): stand pivot to recliner, vc for safe hand placement, mod A for boost up and stabilizing RW that Pt insisted on pulling up from         Functional mobility during ADLs: Minimal assistance;Cueing for sequencing;Cueing for safety;Rolling walker;+2 for safety/equipment General ADL Comments: Pt remains confused, unable to follow directions, very little awareness  Vision       Perception     Praxis      Cognition Arousal/Alertness: Awake/alert Behavior During Therapy: Flat affect(borderline impulsive) Overall Cognitive Status: Impaired/Different  from baseline Area of Impairment: Attention;Following commands;Awareness;Problem solving                   Current Attention Level: Sustained   Following Commands: Follows one step commands with increased time;Follows one step commands inconsistently   Awareness: Emergent Problem Solving: Slow processing;Decreased initiation;Requires verbal cues General Comments: Pt does not answer questions or follow commands consistently. Pt is "joker" at baseline, so its hard to pin point if he is trying to be funny, or cannot follow commands.        Exercises     Shoulder Instructions       General Comments wife present for session    Pertinent Vitals/ Pain       Pain Assessment: No/denies pain Pain Intervention(s): Monitored during session  Home Living                                          Prior Functioning/Environment              Frequency  Min 2X/week        Progress Toward Goals  OT Goals(current goals can now be found in the care plan section)  Progress towards OT goals: Not progressing toward goals - comment(decreased cognition)  Acute Rehab OT Goals Patient Stated Goal: visit with friends OT Goal Formulation: With patient Time For Goal Achievement: 08/03/17 Potential to Achieve Goals: [place] Discharge plan needs to be updated;Frequency remains appropriate    Co-evaluation                 AM-PAC PT "6 Clicks" Daily Activity     Outcome Measure   Help from another person eating meals?: A Little Help from another person taking care of personal grooming?: A Little Help from another person toileting, which includes using toliet, bedpan, or urinal?: A Little Help from another person bathing (including washing, rinsing, drying)?: A Little Help from another person to put on and taking off regular upper body clothing?: A Little Help from another person to put on and taking off regular lower body clothing?: A Little 6 Click  Score: 18    End of Session Equipment Utilized During Treatment: Oxygen;Gait belt;Rolling walker  OT Visit Diagnosis: Unsteadiness on feet (R26.81);Other abnormalities of gait and mobility (R26.89);Muscle weakness (generalized) (M62.81);Other symptoms and signs involving cognitive function   Activity Tolerance Patient tolerated treatment well   Patient Left in chair;with call bell/phone within reach;with family/visitor present(no chair alarm - family aware, RN aware)   Nurse Communication Mobility status(no chair alarm)        Time: [id number] OT Time Calculation (min): 21 min  Charges: OT General Charges $OT Visit: 1 Visit OT Treatments $Self Care/Home Management : 8-22 mins  Hulda Humphrey OTR/L [place] 07/29/2017, 1:46 PM

## 2017-07-29 NOTE — Progress Notes (Signed)
Progress Note  Patient Name: Jose [name] Date of Encounter: 07/29/2017 Primary Cardiologist: Dr. Harl Bowie   Subjective   Felt weak and dizzy while walking with PT during my evaluation.   Per PT note 11/28: "Pt remains to present with weakness and dizziness during session.  Intermittently closing his eyes during session and slow to respond to commands.  C/o tingling in R hand. BP in sitting 126/50, BP in standing 106/49."  Will check orthostatic again.   Inpatient Medications    Scheduled Meds: . amiodarone  200 mg Oral BID  . amLODipine  2.5 mg Oral Daily  . apixaban  5 mg Oral BID  . aspirin EC  81 mg Oral Daily  . clopidogrel  75 mg Oral Daily  . ferrous sulfate  325 mg Oral Q breakfast  . hydrALAZINE  50 mg Oral Q8H  . Influenza vac split quadrivalent PF  0.5 mL Intramuscular Tomorrow-1000  . insulin aspart  0-5 Units Subcutaneous QHS  . insulin aspart  0-9 Units Subcutaneous TID WC  . insulin aspart  8 Units Subcutaneous TID WC  . insulin glargine  40 Units Subcutaneous BID  . isosorbide mononitrate  30 mg Oral Daily  . magnesium oxide  400 mg Oral Daily  . metoprolol succinate  12.5 mg Oral BID  . omega-3 acid ethyl esters  2 g Oral Q breakfast  . omega-3 acid ethyl esters  3 g Oral BID AC  . polyethylene glycol  17 g Oral BID  . pravastatin  80 mg Oral q1800  . sertraline  100 mg Oral Daily  . sodium chloride  3 mL Nebulization BID  . tiotropium  18 mcg Inhalation QHS   Continuous Infusions:  PRN Meds: acetaminophen, albuterol, morphine injection, nitroGLYCERIN, ondansetron (ZOFRAN) IV, oxyCODONE-acetaminophen   Vital Signs    Vitals:   07/29/17 0000 07/29/17 0216 07/29/17 0318 07/29/17 0804  BP:   125/72 (!) 159/87  Pulse: 76 76 (!) 48 85  Resp: 13 19 18    Temp:   98 F (36.7 C)   TempSrc:   Axillary   SpO2: 92%  92%   Weight:  287 lb 14.7 oz (130.6 kg)    Height:        Intake/Output Summary (Last 24 hours) at 07/29/2017 1354 Last data filed at  07/29/2017 0600 Gross per 24 hour  Intake 120 ml  Output -  Net 120 ml   Filed Weights   07/27/17 0244 07/28/17 0610 07/29/17 0216  Weight: 293 lb 3.4 oz (133 kg) 287 lb 7.7 oz (130.4 kg) 287 lb 14.7 oz (130.6 kg)    Telemetry    Sinus rhythm with intermittent SVT - Personally Reviewed  ECG    N/A  Physical Exam   GEN: Obease male in No acute distress.   Neck: No JVD Cardiac: RRR, no murmurs, rubs, or gallops.  Respiratory: Diminished breath sound GI: Soft, nontender, non-distended  MS: 1-2+ BL LE edema; No deformity. Neuro:  Nonfocal  Psych: Normal affect   Labs    Chemistry Recent Labs  Lab 07/27/17 0405 07/28/17 0247 07/29/17 0232  NA 126* 125* 124*  K 4.1 4.2 4.4  CL 86* 85* 84*  CO2 31 29 29   GLUCOSE 173* 178* 103*  BUN 42* 43* 48*  CREATININE 1.77* 1.77* 2.25*  CALCIUM 8.7* 8.7* 8.7*  GFRNONAA 38* 38* 29*  GFRAA 44* 44* 33*  ANIONGAP 9 11 11      Hematology Recent Labs  Lab 07/27/17 0405  07/28/17 0247  WBC 8.9 9.8  RBC 3.68* 3.95*  HGB 10.2* 10.8*  HCT 31.7* 33.8*  MCV 86.1 85.6  MCH 27.7 27.3  MCHC 32.2 32.0  RDW 14.5 14.5  PLT 227 251     Radiology    Mr Jodene Nam Head Wo Contrast  Result Date: 07/29/2017 CLINICAL DATA:  Acute presentation with weakness and shortness of breath. Atrial fibrillation. MRI yesterday demonstrated acute infarction in the left parietal periventricular white matter. EXAM: MRA HEAD WITHOUT CONTRAST TECHNIQUE: Angiographic images of the Circle of Willis were obtained using MRA technique without intravenous contrast. COMPARISON:  03/17/2016 FINDINGS: Patient has developed occlusion of the left internal carotid artery in the neck with absence of antegrade flow through the skullbase and siphon region. Reconstituted flow is noted in the supraclinoid ICA with present but diminished flow in the left anterior and middle cerebral artery territories. There appears to be a patent anterior communicating artery and there may be a  small patent posterior communicating artery. On the right, the internal carotid artery is patent through the skullbase and siphon region. There is fetal origin of the right PCA which appears widely patent. Anterior and middle cerebral arteries are patent. No MCA stenosis seen on the right. I think there is moderate stenosis of the A1 segment on the right. Both vertebral arteries are patent at the foramen magnum. Left vertebral artery is dominant. There is focal stenosis of the right vertebral artery at foramen magnum, approximately 70%. Both posterior inferior cerebellar arteries show flow. No basilar stenosis. Posterior circulation branch vessels are patent. IMPRESSION: Interval occlusion of the left internal carotid artery with absence of antegrade flow at the skullbase. Diminished but present flow in the anterior circulation on the left probably secondary to patent anterior and posterior communicating arteries. Stenosis of the A1 segment on the right, additional importance based on the relevance to collateral flow to the left hemisphere. Approximately 70% stenosis of the right vertebral artery at the foramen magnum level. Antegrade flow does persist. Dominant left vertebral artery freely supplies the basilar. Electronically Signed   By: Nelson Chimes M.D.   On: 07/29/2017 11:34   Mr Brain Wo Contrast  Result Date: 07/28/2017 CLINICAL DATA:  66 y/o M; Focal neuro deficit, > 6 hrs, stroke suspected. EXAM: MRI HEAD WITHOUT CONTRAST TECHNIQUE: Axial DWI and axial T2 FLAIR propeller sequences were acquired. Patient was unable to continue the examination and additional sequences were not acquired. COMPARISON:  03/17/2016 MRI of the head. FINDINGS: Subcentimeter focus of reduced diffusion in left parietal periventricular white matter (series 3, image 36). No mass effect or gross hemorrhage. Stable chronic microvascular ischemic changes and parenchymal volume loss of the brain. No abnormal extra-axial FLAIR signal to  suggest hemorrhage. No herniation or effacement of basilar cisterns. Small right maxillary sinus mucous retention cyst. IMPRESSION: 1. Diffusion and axial T2 FLAIR sequences were acquired. 2. Subcentimeter acute infarction in left parietal periventricular white matter. No mass effect or gross hemorrhage. 3. Stable chronic microvascular ischemic changes and parenchymal volume loss of the brain. These results will be called to the ordering clinician or representative by the Radiologist Assistant, and communication documented in the PACS or zVision Dashboard. Electronically Signed   By: Kristine Garbe M.D.   On: 07/28/2017 22:36    Cardiac Studies   Cardiac cath July 14, 2017 -unsuccessful attempted PTCA of occluded distal LAD, chronic occlusion of RCA Diagnostic Diagram       Post-Intervention Diagram  Echo July 14, 2017 - Left ventricle: LVEF is approximately 40% with akinesis of the distal inferoseptal wall and apex. Aneurysmal dilatation of apex. Consider limited echo with contrast to evaluate for thrombus. The cavity size was normal. Wall thickness was increased in a pattern of mild LVH. - Mitral valve: Calcified annulus. Mildly thickened leaflets    Patient Profile     66 y.o. male presenting with extensive anteroapical and inferior infarction, unsuccessful attempt at revascularization of occluded distal LAD, chronic occlusion of RCA.  Moderate reduction inventricular systolic function. Atrial fibrillation with rapid ventricular response present on arrival, resolved with amiodarone. Moderate acute renal failure related to contrast nephrotoxicity/retention, improving.  Assessment & Plan    1. CAD s/p STEMI: Unsuccessful PCI to distal LAD occlusion and not optimal surgical candidate at this time; echo consistent with aneurysmal dilation of apex. No recurrent chest pain.  --asa 81mg  daily x 1 month, plavix 75mg  daily, atorvastatin 40mg   daily, lopresor 12.5mg  BID --Eliquis for LV dilation  2. Acute on chronic systolic CHF: --Ins/outs not accurately recorded; weights inaccurate; still having significant LE and sacral edema. --holding on starting ACE-I  And lasix to to worsening renal function --metoprolol 12.5mg  BID  3. AFib: Maintaining sinus rhythm. Plan current dose of amiodarone 4 weeks (200mg  BID), then decrease to maintenance dose of 200 mg daily CHADSVasc 5 (age, hypertension, diabetes, heart failure, CAD). --amiodarone 200mg  BID --Eliquis   4. Episodes of weakness/  acute infarction in left parietal periventricular white matter on MRI -- Does not have clear seizure like activity or stroke like symptoms. He does have known L ICA stenosis   -- MRI 07/28/17 revealed subcentimeter acute infarction in left parietal periventricular white matter.     -- MRA 07/29/17  Showed Interval occlusion of the left internal carotid artery with absence of antegrade flow at the skullbase. Approximately 70% stenosis of the right vertebral artery at the foramen magnum level.    -- Appreciate neurology recomendations  5. HTN - relatively stable on current medications  6. Acute renal failure on CKD stage III/ hyponatramia - Initial episode with etiology likely combo of contrast nephrotoxicity and urinary retention (now resolved, no foley needed).  - Will consult nephrology given worsening renal function and persistent hyponatremia  7. Orthostatic hypotension - He was orthostatic during last PT session. He he felt weak on left side and severe dizzy with during PT today (pending note). Recheck orthostatic vitals. Consider medication adjustment as needed.   8. OSA: --On CPAP qhs.  9. DM: --IM assisting in DM management.  10. COPD/Smoking: --Stable. Strongly recommend permanent smoking cessation.    SignedLeanor Kail, PA  07/29/2017, 1:54 PM    I have examined the patient and reviewed assessment and plan and  discussed with patient.  Agree with above as stated.  Getting EEG.  Stable from a cardiac standpoint.  Neuro issues have been the biggest issue this week. Continue aggressive secondary prevention and medical therapy for CAD.   Larae Grooms

## 2017-07-29 NOTE — Consult Note (Signed)
PROGRESS NOTE    Jose [name]  ZOX:[id number] DOB: 09-11-1950 DOA: 07/14/2017 PCP: Cleophas Dunker, MD    Brief Narrative:  66 year old male who presents with chest pain and dyspnea. He is known to have coronary artery disease, COPD, dyslipidemia, type 2 diabetes mellitus, hypertension, obesity and tobacco abuse. He had chest pain intermittently for last 3 days prior to hospitalization, associated with dyspnea on exertion. Due to his persistent symptoms he was brought to the hospital, he was found in atrial fibrillation and his electrocardiogram showed ST elevations in the inferior lateral leads, his troponin was 20, and he was immediately taken to the cardiac catheterization lab. On his initial physical examination blood pressure 115/57, heart rate 52, temperature 98.9, oxygen saturation 93% his lungs were clear to auscultation bilaterally heart S1-S2 present, irregularly irregular no S3 or S4 gallop, no murmurs, his abdomen was soft nontender, 1+ lower extremity edema  Cardiac catheterization he was found to have total occlusion of the mid and apical LAD, he underwent Unsuccessful PCI to the LAD. He also was found to have chronic occlusion of the RCA, ejection fraction is 40%.   He developed acute kidney injury. He was placed on amiodarone and started on aggressive medical therapy for his coronary artery disease. Likely not a good surgical candidate.  MRI 07/28/17 revealed subcentimeter acute infarction in left parietal periventricular white matter. Neurology also following. Worsening renal function and hyponatremia. On fluid restriction, hypervolemic. Recommend nephrology evaluation.  Pt seen and examined at his bedside. A little confused with difficulty following commands.   Assessment & Plan:   Principal Problem:   STEMI (ST elevation myocardial infarction) ([place]) Active Problems:   COPD (chronic obstructive pulmonary disease) (HCC)   HTN (hypertension)   Diabetes (HCC)   Morbid  obesity (HCC)   OSA (obstructive sleep apnea)   Chronic diastolic CHF (congestive heart failure) (HCC)   Atrial fibrillation with rapid ventricular response (HCC)   STEMI involving left anterior descending coronary artery (HCC)   Acute on chronic systolic CHF (congestive heart failure), NYHA class 3 (HCC)   Coronary artery disease involving native coronary artery of native heart with unstable angina pectoris (HCC)   PAF (paroxysmal atrial fibrillation) (HCC)   CRI (chronic renal insufficiency), stage 3 (moderate) ([place])   1. Coronary artery disease status post ST elevation myocardial infarction. -aspirin, clopidogrel, metoprolol, hydralazine,isosorbide -reported allergy to statins  2. Atrial fibrillation, new onset. -rate control metoprolol and amiodarone -CHADsVASC score 5 -eliquis for cva prophylaxis  3. Chronic fatigue/weakness/deconditioning -possibly multifactorial from deconditioning vs recent STEMI vs chronic hyponatremia -continue PT  -monitor Na+ level  4. left periventricular white matter infarct secondary to either loss of collateral flow in the setting of acute myocardial infarction and hypertension versus atrial fibrillation vs cardioembolic etiology -neurology following  5. Chronic hyponatremia, hypervolemic -Baseline 127-130 since a year ago\ -Na+worsening Na+ 124 from 125 -Fluid restriction less than 1200 cc per day -monitor urine output -recommend nephrology consult  6. Systolic heart failure, acute on chronic,LV EF 40%, with akinesis of the distal inferior-septal wall and apex. -fluid restriction -strict I&O's daily weight -continue core measures asa, metoprolol, allergic to statin  7. Acute kidney injury on chronic kidney disease stage III. -worsening -cr 2.25 from 1.77 from 1.93 -baseline cr.1.44 -avoid nephrotoxic meds -BMP am -recommend nephrology  8. Chronic normocytic anemia -stable -no sign of bleeding -hg 10.7 mcv 85 -ferrous  sulfate -cbc am  9. Type 2 diabetes mellitus. -stable -basal insulin to 40 units bid, 8  units of aspart ac.  -ISS with hypoglycemic protocol -A1C 7.9 (07/15/17)  10. HTN. -stable -metoprolol, hydralazine and isosorbide.   11. COPD. -No acuteexacerbation. -albuterolandtiotropium.  12. Chronic depression/anxiety -zoloft -ativan IV 1 mg prn  13. Chronic constipation -miralax  14. OSA -CPAP at night  DVT prophylaxis:apixaban Code Status:full Family Communication:wife at bedside.   Consultants:  [facility]  Neurology  Procedures:  Cardiac catheterization    Objective: Vitals:   07/28/17 2319 07/29/17 0000 07/29/17 0216 07/29/17 0318  BP:    125/72  Pulse:  76 76 (!) 48  Resp:  13 19 18   Temp:    98 F (36.7 C)  TempSrc:    Axillary  SpO2: 95% 92%  92%  Weight:   130.6 kg (287 lb 14.7 oz)   Height:        Intake/Output Summary (Last 24 hours) at 07/29/2017 0751 Last data filed at 07/29/2017 0600 Gross per 24 hour  Intake 360 ml  Output -  Net 360 ml   Filed Weights   07/27/17 0244 07/28/17 0610 07/29/17 0216  Weight: 133 kg (293 lb 3.4 oz) 130.4 kg (287 lb 7.7 oz) 130.6 kg (287 lb 14.7 oz)    Examination:   General: 66 yo CM obese, NAD. A&O x3 Neurology: Awake and alert, difficulty following commands EENT: mild pallor, no icterus, oral mucosa moist Cardiovascular: No JVD. S1-S2 present, rhythmic, no gallops, rubs, or murmurs. 2+ pitting edema LE bilaterally. Pulmonary: mild decreased breath sounds bilaterally at bases, adequate air movement, no wheezing, rhonchi or rales. Gastrointestinal. Abdomen morbidly obese, no organomegaly, non tender, no rebound or guarding Skin. No open lesions noted. Musculoskeletal: no joint deformities     Data Reviewed: I have personally reviewed following labs and imaging studies  CBC: Recent Labs  Lab 07/27/17 0405 07/28/17 0247  WBC 8.9 9.8  HGB 10.2* 10.8*  HCT 31.7* 33.8*  MCV 86.1  85.6  PLT 227 818   Basic Metabolic Panel: Recent Labs  Lab 07/24/17 0205 07/25/17 0220 07/26/17 0213 07/26/17 2140 07/27/17 0405 07/28/17 0247 07/29/17 0232  NA 126* 128* 125*  --  126* 125* 124*  K 4.2 4.5 4.3  --  4.1 4.2 4.4  CL 80* 84* 83*  --  86* 85* 84*  CO2 34* 33* 31  --  31 29 29   GLUCOSE 293* 199* 284* 283* 173* 178* 103*  BUN 36* 41* 43*  --  42* 43* 48*  CREATININE 1.70* 1.89* 1.93*  --  1.77* 1.77* 2.25*  CALCIUM 9.1 8.9 8.7*  --  8.7* 8.7* 8.7*  MG 1.8  --   --   --   --   --   --    GFR: Estimated Creatinine Clearance: 45.8 mL/min (A) (by C-G formula based on SCr of 2.25 mg/dL (H)). Liver Function Tests: No results for input(s): AST, ALT, ALKPHOS, BILITOT, PROT, ALBUMIN in the last 168 hours. No results for input(s): LIPASE, AMYLASE in the last 168 hours. No results for input(s): AMMONIA in the last 168 hours. Coagulation Profile: No results for input(s): INR, PROTIME in the last 168 hours. Cardiac Enzymes: Recent Labs  Lab 07/24/17 0952  CKTOTAL 124   BNP (last 3 results) No results for input(s): PROBNP in the last 8760 hours. HbA1C: No results for input(s): HGBA1C in the last 72 hours. CBG: Recent Labs  Lab 07/28/17 0808 07/28/17 1132 07/28/17 1638 07/28/17 2037 07/29/17 0630  GLUCAP 204* 202* 168* 117* 104*   Lipid Profile: No results  for input(s): CHOL, HDL, LDLCALC, TRIG, CHOLHDL, LDLDIRECT in the last 72 hours. Thyroid Function Tests: No results for input(s): TSH, T4TOTAL, FREET4, T3FREE, THYROIDAB in the last 72 hours. Anemia Panel: No results for input(s): VITAMINB12, FOLATE, FERRITIN, TIBC, IRON, RETICCTPCT in the last 72 hours.    Radiology Studies: I have reviewed all of the imaging during this hospital visit personally     Scheduled Meds: . amiodarone  200 mg Oral BID  . amLODipine  2.5 mg Oral Daily  . apixaban  5 mg Oral BID  . aspirin EC  81 mg Oral Daily  . clopidogrel  75 mg Oral Daily  . ferrous sulfate  325 mg  Oral Q breakfast  . hydrALAZINE  50 mg Oral Q8H  . Influenza vac split quadrivalent PF  0.5 mL Intramuscular Tomorrow-1000  . insulin aspart  0-5 Units Subcutaneous QHS  . insulin aspart  0-9 Units Subcutaneous TID WC  . insulin aspart  8 Units Subcutaneous TID WC  . insulin glargine  40 Units Subcutaneous BID  . isosorbide mononitrate  30 mg Oral Daily  . magnesium oxide  400 mg Oral Daily  . metoprolol succinate  12.5 mg Oral BID  . omega-3 acid ethyl esters  2 g Oral Q breakfast  . omega-3 acid ethyl esters  3 g Oral BID AC  . polyethylene glycol  17 g Oral BID  . sertraline  100 mg Oral Daily  . tiotropium  18 mcg Inhalation QHS   Continuous Infusions:   LOS: 15 days        Kayleen Memos, MD [facility] Pager [phone]

## 2017-07-29 NOTE — Progress Notes (Signed)
EEG complete - results pending 

## 2017-07-29 NOTE — Evaluation (Signed)
Clinical/Bedside Swallow Evaluation Patient Details  Name: Jose [name] MRN: [id number] Date of Birth: 01/14/1951  Today's Date: 07/29/2017 Time: SLP Start Time (ACUTE ONLY): 1440 SLP Stop Time (ACUTE ONLY): 1508 SLP Time Calculation (min) (ACUTE ONLY): 28 min  Past Medical History:  Past Medical History:  Diagnosis Date  . CAD (coronary artery disease)    a. prior silent inferior MI, NSTEMI 07/2014 with chronically occluded RCA s/p unsuccessful PCI with otherwise nonobstructive residual disease  . Chronic respiratory failure ([place])    a. Placed on home O2 01/2016.  [name] Kitchen COPD (chronic obstructive pulmonary disease) ([place])   . Diabetes mellitus ([place])   . Former tobacco use   . Hyperlipidemia   . Hypertension   . Morbid obesity ([place])   . Neuropathy   . Sleep apnea    Past Surgical History:  Past Surgical History:  Procedure Laterality Date  . BACK SURGERY    . CORONARY BALLOON ANGIOPLASTY N/A 07/14/2017   Procedure: CORONARY BALLOON ANGIOPLASTY;  Surgeon: Martinique, Peter M, MD;  Location: [facility];  Service: Cardiovascular;  Laterality: N/A;  . INTRAVASCULAR ULTRASOUND/IVUS N/A 07/14/2017   Procedure: Intravascular Ultrasound/IVUS;  Surgeon: Martinique, Peter M, MD;  Location: [facility];  Service: Cardiovascular;  Laterality: N/A;  . LEFT HEART CATH AND CORONARY ANGIOGRAPHY N/A 07/14/2017   Procedure: LEFT HEART CATH AND CORONARY ANGIOGRAPHY;  Surgeon: Martinique, Peter M, MD;  Location: [facility];  Service: Cardiovascular;  Laterality: N/A;  . LEFT HEART CATHETERIZATION WITH CORONARY ANGIOGRAM N/A 08/28/2014   Procedure: LEFT HEART CATHETERIZATION WITH CORONARY ANGIOGRAM;  Surgeon: Clent Demark, MD;  Location: [facility];  Service: Cardiovascular;  Laterality: N/A;  . PERIPHERAL VASCULAR CATHETERIZATION Bilateral 03/22/2016   Procedure: Carotid Angiography;  Surgeon: Elam Dutch, MD;  Location: [facility];  Service: Cardiovascular;  Laterality:  Bilateral;   HPI:  Pt is a 66 y.o. male who presented to [facility] on 07/14/17 with chest pain and dyspnea. On arrival to [facility] ED, his EKG showed new onset A-fib with ST elevation in the inferolateral leads; CODE STEMI was called. Cardiac cath 11/15 with unsuccessful attempt at revascularization of occluded distal LAD, chronic occlusion of RCA. MRI on 07/28/17 revealed subcentimeter acute infarction in left parietal periventricular white matter Pertinent PMH includes CAD, COPD, HL, HTN, obesity,DMand tobacco use. BSE initiated due to neuro changes (onset dysarthria).    Assessment / Plan / Recommendation Clinical Impression  Vocal quality found to be intermittently wet with audible laryngeal/pharyngeal phlegm and expectorating mucous throughout evaluation. Pt complains of odonophagia and grimacing with each swallow which started today per RN. Signs of pharyngeal dysphagia present with immediate cough following straw sips water, intermittent wet quality and delayed throat clears following cups sips thin. Unable to fully determine safety with po's. Recommend MBS (in room to accomodate broad shoulders) tomorrow. Okay for sips water and ice chips and meds crushed in puree.    SLP Visit Diagnosis: Dysphagia, pharyngeal phase (R13.13)    Aspiration Risk  Moderate aspiration risk    Diet Recommendation NPO except meds;Ice chips PRN after oral care   Medication Administration: Crushed with puree    Other  Recommendations Oral Care Recommendations: Oral care QID   Follow up Recommendations (TBD)      Frequency and Duration            Prognosis        Swallow Study   General HPI: Pt is a 66 y.o.  male who presented to [facility] on 07/14/17 with chest pain and dyspnea. On arrival to [facility] ED, his EKG showed new onset A-fib with ST elevation in the inferolateral leads; CODE STEMI was called. Cardiac cath 11/15 with unsuccessful attempt at revascularization of occluded distal LAD, chronic occlusion of RCA.  MRI on 07/28/17 revealed subcentimeter acute infarction in left parietal periventricular white matter Pertinent PMH includes CAD, COPD, HL, HTN, obesity,DMand tobacco use. BSE initiated due to neuro changes (onset dysarthria).  Type of Study: Bedside Swallow Evaluation Previous Swallow Assessment: (none) Diet Prior to this Study: Regular;Thin liquids Temperature Spikes Noted: No Respiratory Status: Room air History of Recent Intubation: No Behavior/Cognition: Cooperative;Lethargic/Drowsy;Requires cueing Oral Cavity Assessment: Dry Oral Care Completed by SLP: Yes Oral Cavity - Dentition: Missing dentition;Poor condition Vision: Functional for self-feeding Self-Feeding Abilities: Able to feed self Patient Positioning: Upright in bed Baseline Vocal Quality: Wet Volitional Cough: Strong Volitional Swallow: Able to elicit    Oral/Motor/Sensory Function Overall Oral Motor/Sensory Function: Within functional limits   Ice Chips Ice chips: Not tested   Thin Liquid Thin Liquid: Impaired Presentation: Cup;Straw Pharyngeal  Phase Impairments: Cough - Immediate;Throat Clearing - Delayed;Wet Vocal Quality    Nectar Thick Nectar Thick Liquid: Not tested   Honey Thick Honey Thick Liquid: Not tested   Puree Puree: Impaired Presentation: Spoon Pharyngeal Phase Impairments: Wet Vocal Quality   Solid   GO   Solid: Not tested        Houston Siren 07/29/2017,3:32 PM  Orbie Pyo Colvin Caroli.Ed [employer] [phone]

## 2017-07-30 ENCOUNTER — Inpatient Hospital Stay (HOSPITAL_COMMUNITY): Payer: Medicare Other

## 2017-07-30 LAB — BASIC METABOLIC PANEL
ANION GAP: 14 (ref 5–15)
BUN: 51 mg/dL — ABNORMAL HIGH (ref 6–20)
CALCIUM: 8.6 mg/dL — AB (ref 8.9–10.3)
CO2: 29 mmol/L (ref 22–32)
CREATININE: 2.23 mg/dL — AB (ref 0.61–1.24)
Chloride: 84 mmol/L — ABNORMAL LOW (ref 101–111)
GFR, EST AFRICAN AMERICAN: 34 mL/min — AB (ref >60)
GFR, EST NON AFRICAN AMERICAN: 29 mL/min — AB (ref >60)
Glucose, Bld: 127 mg/dL — ABNORMAL HIGH (ref 65–99)
Potassium: 4.2 mmol/L (ref 3.5–5.1)
SODIUM: 127 mmol/L — AB (ref 135–145)

## 2017-07-30 LAB — GLUCOSE, CAPILLARY
GLUCOSE-CAPILLARY: 123 mg/dL — AB (ref 65–99)
GLUCOSE-CAPILLARY: 202 mg/dL — AB (ref 65–99)
Glucose-Capillary: 124 mg/dL — ABNORMAL HIGH (ref 65–99)
Glucose-Capillary: 224 mg/dL — ABNORMAL HIGH (ref 65–99)

## 2017-07-30 NOTE — Procedures (Signed)
History: 66 year old male being evaluated for encephalopathy  Sedation: None  Technique: This is a 21 channel routine scalp EEG performed at the bedside with bipolar and monopolar montages arranged in accordance to the international 10/20 system of electrode placement. One channel was dedicated to EKG recording.    Background: The background consists of generalized irregular delta and theta activity. There is a posterior dominant rhythm which is poorly sustained and poorly formed that achieves a maximal frequency of 8 hz..  Photic stimulation: Physiologic driving is  Present  EEG Abnormalities: 1) generalized irregular slow activity  Clinical Interpretation: This EEG is consistent with a generalized non-specific cerebral dysfunction(encephalopathy). There was no seizure or seizure predisposition recorded on this study.   Roland Rack, MD [facility] [phone]  If 7pm- 7am, please page neurology on call as listed in [place].

## 2017-07-30 NOTE — Consult Note (Signed)
PROGRESS NOTE    Jose [name]  RCV:[id number] DOB: 08-16-1951 DOA: 07/14/2017 PCP: Cleophas Dunker, MD    Brief Narrative:  66 year old male who presents with chest pain and dyspnea. He is known to have coronary artery disease, COPD, dyslipidemia, type 2 diabetes mellitus, hypertension, obesity and tobacco abuse. He had chest pain intermittently for last 3 days prior to hospitalization, associated with dyspnea on exertion. Due to his persistent symptoms he was brought to the hospital, he was found in atrial fibrillation and his electrocardiogram showed ST elevations in the inferior lateral leads, his troponin was 20, and he was immediately taken to the cardiac catheterization lab. On his initial physical examination blood pressure 115/57, heart rate 52, temperature 98.9, oxygen saturation 93% his lungs were clear to auscultation bilaterally heart S1-S2 present, irregularly irregular no S3 or S4 gallop, no murmurs, his abdomen was soft nontender, 1+ lower extremity edema  Cardiac catheterization he was found to have total occlusion of the mid and apical LAD, he underwent Unsuccessful PCI to the LAD. He also was found to have chronic occlusion of the RCA, ejection fraction is 40%.   He developed acute kidney injury. He was placed on amiodarone and started on aggressive medical therapy for his coronary artery disease. Likely not a good surgical candidate.  MRI 07/28/17 revealed subcentimeter acute infarction in left parietal periventricular white matter. Neurology following. Worsening renal function and hyponatremia. On fluid restriction, hypervolemic. Recommend nephrology evaluation.  No acute events overnight. Pt seen and examined with no family member at his bedside. Somnolent.  Assessment & Plan:   Principal Problem:   STEMI (ST elevation myocardial infarction) ([place]) Active Problems:   COPD (chronic obstructive pulmonary disease) (HCC)   HTN (hypertension)   Diabetes (HCC)   Morbid  obesity (HCC)   OSA (obstructive sleep apnea)   Chronic diastolic CHF (congestive heart failure) (HCC)   Atrial fibrillation with rapid ventricular response (HCC)   STEMI involving left anterior descending coronary artery (HCC)   Acute on chronic systolic CHF (congestive heart failure), NYHA class 3 (HCC)   Coronary artery disease involving native coronary artery of native heart with unstable angina pectoris (HCC)   PAF (paroxysmal atrial fibrillation) (HCC)   CRI (chronic renal insufficiency), stage 3 (moderate) (HCC)   Lacunar infarction   Carotid occlusion, left   Hyponatremia  Insulin dependent Type 2 diabetes -well controlled -continue current management  Hyponatremia is improving Na+ 127 from 124  AKI improving Cr 2.23 from 2.25  Will sign off. If you have any questions please do not hesitate to contact us.  1. Coronary artery disease status post ST elevation myocardial infarction. -aspirin, clopidogrel, metoprolol, hydralazine,isosorbide -reported allergy to statins  2. Atrial fibrillation, new onset. -rate control metoprolol and amiodarone -CHADsVASC score 5 -eliquis for cva prophylaxis  3. Chronic fatigue/weakness/deconditioning -possibly multifactorial from deconditioning vs recent STEMI vs chronic hyponatremia -continue PT  -monitor Na+ level  4. left periventricular white matter infarct secondary to either loss of collateral flow in the setting of acute myocardial infarction and hypertension versus atrial fibrillation vs cardioembolic etiology -neurology following  5. Chronic hyponatremia, hypervolemic -Baseline 127-130 since a year ago\ -Na+ 127 -Fluid restriction less than 1200 cc per day -monitor urine output -recommend nephrology consult  6. Systolic heart failure, acute on chronic,LV EF 40%, with akinesis of the distal inferior-septal wall and apex. -fluid restriction -strict I&O's daily weight -continue core measures asa, metoprolol,  allergic to statin  7. Acute kidney injury on chronic kidney disease  stage III. -worsening -cr 2.23 from  2.25 from 1.77 from 1.93 -baseline cr.1.44 -avoid nephrotoxic meds -BMP am -recommend nephrology  8. Chronic normocytic anemia -stable -no sign of bleeding -hg 10.7 mcv 85 -ferrous sulfate -cbc am  9. Type 2 diabetes mellitus. -stable -basal insulin to 40 units bid, 8 units of aspart ac.  -ISS with hypoglycemic protocol -A1C 7.9 (07/15/17)  10. HTN. -stable -metoprolol, hydralazine and isosorbide.   11. COPD. -No acuteexacerbation. -albuterolandtiotropium.  12. Chronic depression/anxiety -zoloft -ativan IV 1 mg prn  13. Chronic constipation -miralax  14. OSA -CPAP at night  DVT prophylaxis:apixaban Code Status:full Family Communication:wife at bedside.   Consultants:  [facility]  Neurology  Procedures:  Cardiac catheterization    Objective: Vitals:   07/30/17 0345 07/30/17 0600 07/30/17 0625 07/30/17 0827  BP: 129/63  138/89   Pulse:  70    Resp:  16    Temp: 98.6 F (37 C)     TempSrc: Oral     SpO2: 99%   100%  Weight: 134 kg (295 lb 6.7 oz)     Height:        Intake/Output Summary (Last 24 hours) at 07/30/2017 1516 Last data filed at 07/30/2017 0349 Gross per 24 hour  Intake -  Output 500 ml  Net -500 ml   Filed Weights   07/28/17 0610 07/29/17 0216 07/30/17 0345  Weight: 130.4 kg (287 lb 7.7 oz) 130.6 kg (287 lb 14.7 oz) 134 kg (295 lb 6.7 oz)    Examination:   General: 66 yo CM obese, NAD. Neurology: Awake and alert, difficulty following commands EENT: mild pallor, no icterus, oral mucosa moist Cardiovascular: No JVD. S1-S2 present, rhythmic, no gallops, rubs, or murmurs. 2+ pitting edema LE bilaterally. Pulmonary: mild decreased breath sounds bilaterally at bases, adequate air movement, no wheezing, rhonchi or rales. Gastrointestinal. Abdomen morbidly obese, no organomegaly, non tender, no rebound or  guarding Skin. No open lesions noted. Musculoskeletal: lower extremity edema 1+ improving     Data Reviewed: I have personally reviewed following labs and imaging studies  CBC: Recent Labs  Lab 07/27/17 0405 07/28/17 0247  WBC 8.9 9.8  HGB 10.2* 10.8*  HCT 31.7* 33.8*  MCV 86.1 85.6  PLT 227 193   Basic Metabolic Panel: Recent Labs  Lab 07/24/17 0205  07/26/17 0213 07/26/17 2140 07/27/17 0405 07/28/17 0247 07/29/17 0232 07/30/17 0226  NA 126*   < > 125*  --  126* 125* 124* 127*  K 4.2   < > 4.3  --  4.1 4.2 4.4 4.2  CL 80*   < > 83*  --  86* 85* 84* 84*  CO2 34*   < > 31  --  31 29 29 29   GLUCOSE 293*   < > 284* 283* 173* 178* 103* 127*  BUN 36*   < > 43*  --  42* 43* 48* 51*  CREATININE 1.70*   < > 1.93*  --  1.77* 1.77* 2.25* 2.23*  CALCIUM 9.1   < > 8.7*  --  8.7* 8.7* 8.7* 8.6*  MG 1.8  --   --   --   --   --   --   --    < > = values in this interval not displayed.   GFR: Estimated Creatinine Clearance: 46.8 mL/min (A) (by C-G formula based on SCr of 2.23 mg/dL (H)). Liver Function Tests: No results for input(s): AST, ALT, ALKPHOS, BILITOT, PROT, ALBUMIN in the last 168 hours. No  results for input(s): LIPASE, AMYLASE in the last 168 hours. No results for input(s): AMMONIA in the last 168 hours. Coagulation Profile: No results for input(s): INR, PROTIME in the last 168 hours. Cardiac Enzymes: Recent Labs  Lab 07/24/17 0952  CKTOTAL 124   BNP (last 3 results) No results for input(s): PROBNP in the last 8760 hours. HbA1C: No results for input(s): HGBA1C in the last 72 hours. CBG: Recent Labs  Lab 07/29/17 1127 07/29/17 1651 07/29/17 2035 07/30/17 0613 07/30/17 1104  GLUCAP 126* 120* 134* 123* 124*   Lipid Profile: No results for input(s): CHOL, HDL, LDLCALC, TRIG, CHOLHDL, LDLDIRECT in the last 72 hours. Thyroid Function Tests: No results for input(s): TSH, T4TOTAL, FREET4, T3FREE, THYROIDAB in the last 72 hours. Anemia Panel: No results for  input(s): VITAMINB12, FOLATE, FERRITIN, TIBC, IRON, RETICCTPCT in the last 72 hours.    Radiology Studies: I have reviewed all of the imaging during this hospital visit personally     Scheduled Meds: . amiodarone  200 mg Oral BID  . amLODipine  2.5 mg Oral Daily  . apixaban  5 mg Oral BID  . aspirin EC  81 mg Oral Daily  . clopidogrel  75 mg Oral Daily  . ezetimibe  10 mg Oral Daily  . ferrous sulfate  325 mg Oral Q breakfast  . hydrALAZINE  50 mg Oral Q8H  . Influenza vac split quadrivalent PF  0.5 mL Intramuscular Tomorrow-1000  . insulin aspart  0-5 Units Subcutaneous QHS  . insulin aspart  0-9 Units Subcutaneous TID WC  . insulin aspart  8 Units Subcutaneous TID WC  . insulin glargine  40 Units Subcutaneous BID  . isosorbide mononitrate  30 mg Oral Daily  . magnesium oxide  400 mg Oral Daily  . metoprolol succinate  12.5 mg Oral BID  . omega-3 acid ethyl esters  2 g Oral Q breakfast  . omega-3 acid ethyl esters  3 g Oral BID AC  . polyethylene glycol  17 g Oral BID  . pravastatin  20 mg Oral q1800  . sertraline  100 mg Oral Daily  . sodium chloride  3 mL Nebulization BID  . tiotropium  18 mcg Inhalation QHS   Continuous Infusions:   LOS: 16 days        Kayleen Memos, MD [facility] Pager [phone]

## 2017-07-30 NOTE — Progress Notes (Signed)
Subjective:  Having difficulty swallowing and currently scheduled to go for a sleep study.  Currently in the bed and still feels somewhat weak.  No shortness of breath or chest pain.  Objective:  Vital Signs in the last 24 hours: BP 138/89   Pulse 70   Temp 98.6 F (37 C) (Oral)   Resp 16   Ht 6\' 1"  (1.854 m)   Wt 134 kg (295 lb 6.7 oz)   SpO2 100%   BMI 38.98 kg/m   Physical Exam: Obese white male lying in bed currently in no acute distress some hoarseness. Lungs:  Clear Cardiac:  Regular rhythm, normal S1 and S2, no S3 Extremities:  No edema present  Intake/Output from previous day: 11/30 0701 - 12/01 0700 In: -  Out: 500 [Urine:500]  Weight Filed Weights   07/28/17 0610 07/29/17 0216 07/30/17 0345  Weight: 130.4 kg (287 lb 7.7 oz) 130.6 kg (287 lb 14.7 oz) 134 kg (295 lb 6.7 oz)    Lab Results: Basic Metabolic Panel: Recent Labs    07/29/17 0232 07/30/17 0226  NA 124* 127*  K 4.4 4.2  CL 84* 84*  CO2 29 29  GLUCOSE 103* 127*  BUN 48* 51*  CREATININE 2.25* 2.23*   CBC: Recent Labs    07/28/17 0247  WBC 9.8  HGB 10.8*  HCT 33.8*  MCV 85.6  PLT 251   Telemetry: Personally reviewed.  Currently in sinus rhythm  Assessment/Plan:  1.  Previous acute myocardial infarction not able to be revascularized 2.  Acute on chronic systolic heart failure 3.  Dysphagia 4.  Paroxysmal atrial fibrillation currently in sinus rhythm 5.  Acute on chronic kidney disease fairly stable at present time 6. Orthostasis appears improved  Recommendations:  His cardiovascular status appears to be relatively stable at this time.  He is going down for a swallowing study.  No change in his medications at this time.  Biggest issues are neurologic and weakness.      Kerry Hough  MD [facility]  07/30/2017, 12:44 PM

## 2017-07-30 NOTE — Progress Notes (Signed)
Modified Barium Swallow Progress Note  Patient Details  Name: Jose [name] MRN: [id number] Date of Birth: 04-02-1951  Today's Date: 07/30/2017  Modified Barium Swallow completed.  Full report located under Chart Review in the Imaging Section.  Brief recommendations include the following:  Clinical Impression  Patient presents with mild pharyngeal phase dysphagia due to structural anomaly, with prominent epiglottic tissue which appears to extend inferiorly. This impedes complete epiglottic deflection and laryngeal closure, resulting in intermittent transient penetration during the swallow with liquids. Mild-moderate residue (solids>liquids) in the valleculae which reduces with subsequent dry swallows, liquid wash. Recommend pt consume regular diet with thin liquids; SLP will follow briefly for tolerance and training in compensatory strategies. Pt would benefit from ENT consult to further assess epiglottic tissue, particularly as pt has been complaining of odynophagia and has a history of tobacco abuse.   Swallow Evaluation Recommendations   Recommended Consults: Consider ENT evaluation   SLP Diet Recommendations: Regular solids;Thin liquid   Liquid Administration via: Cup;Straw   Medication Administration: Whole meds with liquid       Compensations: Follow solids with liquid;Clear throat intermittently;Multiple dry swallows after each bite/sip;Slow rate;Small sips/bites   Postural Changes: Seated upright at 90 degrees   Oral Care Recommendations: Oral care BID      Jose [name], [place], [place] Speech-Language Pathologist [phone]  Jose [name] 07/30/2017,3:46 PM

## 2017-07-31 LAB — BASIC METABOLIC PANEL
ANION GAP: 11 (ref 5–15)
BUN: 56 mg/dL — ABNORMAL HIGH (ref 6–20)
CO2: 30 mmol/L (ref 22–32)
Calcium: 8.3 mg/dL — ABNORMAL LOW (ref 8.9–10.3)
Chloride: 84 mmol/L — ABNORMAL LOW (ref 101–111)
Creatinine, Ser: 2.14 mg/dL — ABNORMAL HIGH (ref 0.61–1.24)
GFR, EST AFRICAN AMERICAN: 35 mL/min — AB (ref >60)
GFR, EST NON AFRICAN AMERICAN: 30 mL/min — AB (ref >60)
Glucose, Bld: 179 mg/dL — ABNORMAL HIGH (ref 65–99)
POTASSIUM: 3.9 mmol/L (ref 3.5–5.1)
SODIUM: 125 mmol/L — AB (ref 135–145)

## 2017-07-31 LAB — GLUCOSE, CAPILLARY
GLUCOSE-CAPILLARY: 181 mg/dL — AB (ref 65–99)
GLUCOSE-CAPILLARY: 249 mg/dL — AB (ref 65–99)
GLUCOSE-CAPILLARY: 192 mg/dL — AB (ref 65–99)
Glucose-Capillary: 202 mg/dL — ABNORMAL HIGH (ref 65–99)

## 2017-07-31 MED ORDER — OXYMETAZOLINE HCL 0.05 % NA SOLN
1.0000 | Freq: Once | NASAL | Status: DC | PRN
  Filled 2017-07-31: qty 15

## 2017-07-31 MED ORDER — TRIPLE ANTIBIOTIC 3.5-400-5000 EX OINT
1.0000 "application " | TOPICAL_OINTMENT | Freq: Once | CUTANEOUS | Status: DC | PRN
  Filled 2017-07-31: qty 1

## 2017-07-31 MED ORDER — LIDOCAINE HCL 2 % EX GEL
1.0000 "application " | Freq: Once | CUTANEOUS | Status: DC | PRN
  Filled 2017-07-31: qty 5

## 2017-07-31 MED ORDER — LIDOCAINE-EPINEPHRINE (PF) 1 %-1:200000 IJ SOLN
0.0000 mL | Freq: Once | INTRAMUSCULAR | Status: DC | PRN
  Filled 2017-07-31: qty 30

## 2017-07-31 MED ORDER — SILVER NITRATE-POT NITRATE 75-25 % EX MISC
1.0000 | Freq: Once | CUTANEOUS | Status: DC | PRN
  Filled 2017-07-31: qty 1

## 2017-07-31 MED ORDER — LIDOCAINE HCL 4 % EX SOLN
0.0000 mL | Freq: Once | CUTANEOUS | Status: DC | PRN
  Filled 2017-07-31: qty 50

## 2017-07-31 NOTE — Consult Note (Signed)
Reason for Consult: Sore throat and trouble swallowing Referring Physician: Martinique, Peter M, MD  Jose [name] is an 66 y.o. male.  HPI: Admitted to the hospital with a coronary event, and evidence of a recent stroke.  For the past 3 days he has been complaining about sore throat and some discomfort additionally when he swallows.  Prior to that everything seemed to be okay.  He quit smoking several years ago.  He does not drink alcohol.  He has had speech and swallowing evaluation, and there was some abnormality of the larynx that was identified that was felt to be of concern.  Past Medical History:  Diagnosis Date  . CAD (coronary artery disease)    a. prior silent inferior MI, NSTEMI 07/2014 with chronically occluded RCA s/p unsuccessful PCI with otherwise nonobstructive residual disease  . Chronic respiratory failure ([place])    a. Placed on home O2 01/2016.  [name] Kitchen COPD (chronic obstructive pulmonary disease) ([place])   . Diabetes mellitus ([place])   . Former tobacco use   . Hyperlipidemia   . Hypertension   . Morbid obesity ([place])   . Neuropathy   . Sleep apnea     Past Surgical History:  Procedure Laterality Date  . BACK SURGERY    . CORONARY BALLOON ANGIOPLASTY N/A 07/14/2017   Procedure: CORONARY BALLOON ANGIOPLASTY;  Surgeon: Martinique, Peter M, MD;  Location: [facility];  Service: Cardiovascular;  Laterality: N/A;  . INTRAVASCULAR ULTRASOUND/IVUS N/A 07/14/2017   Procedure: Intravascular Ultrasound/IVUS;  Surgeon: Martinique, Peter M, MD;  Location: [facility];  Service: Cardiovascular;  Laterality: N/A;  . LEFT HEART CATH AND CORONARY ANGIOGRAPHY N/A 07/14/2017   Procedure: LEFT HEART CATH AND CORONARY ANGIOGRAPHY;  Surgeon: Martinique, Peter M, MD;  Location: [facility];  Service: Cardiovascular;  Laterality: N/A;  . LEFT HEART CATHETERIZATION WITH CORONARY ANGIOGRAM N/A 08/28/2014   Procedure: LEFT HEART CATHETERIZATION WITH CORONARY ANGIOGRAM;  Surgeon: Clent Demark, MD;   Location: [facility];  Service: Cardiovascular;  Laterality: N/A;  . PERIPHERAL VASCULAR CATHETERIZATION Bilateral 03/22/2016   Procedure: Carotid Angiography;  Surgeon: Elam Dutch, MD;  Location: [facility];  Service: Cardiovascular;  Laterality: Bilateral;    Family History  Problem Relation Age of Onset  . Hypertension Father   . Heart disease Father     Social History:  reports that he has quit smoking. His smoking use included cigarettes. He has a 135.00 pack-year smoking history. he has never used smokeless tobacco. He reports that he does not drink alcohol or use drugs.  Allergies:  Allergies  Allergen Reactions  . Atorvastatin     Tremors  . Simvastatin     Muscle weakness    Medications: Reviewed  Results for orders placed or performed during the hospital encounter of 07/14/17 (from the past 48 hour(s))  Glucose, capillary     Status: Abnormal   Collection Time: 07/29/17  4:51 PM  Result Value Ref Range   Glucose-Capillary 120 (H) 65 - 99 mg/dL  Blood gas, arterial     Status: Abnormal   Collection Time: 07/29/17  8:25 PM  Result Value Ref Range   O2 Content 2.0 L/min   Delivery systems NASAL CANNULA    pH, Arterial 7.468 (H) 7.350 - 7.450   pCO2 arterial 40.3 32.0 - 48.0 mmHg   pO2, Arterial 102 83.0 - 108.0 mmHg   Bicarbonate 28.8 (H) 20.0 - 28.0 mmol/L   Acid-Base Excess 5.1 (H)  0.0 - 2.0 mmol/L   O2 Saturation 95.3 %   Patient temperature 98.6    Collection site RIGHT RADIAL    Drawn by [phone]    Sample type ARTERIAL DRAW    Allens test (pass/fail) PASS PASS  Glucose, capillary     Status: Abnormal   Collection Time: 07/29/17  8:35 PM  Result Value Ref Range   Glucose-Capillary 134 (H) 65 - 99 mg/dL  Basic metabolic panel     Status: Abnormal   Collection Time: 07/30/17  2:26 AM  Result Value Ref Range   Sodium 127 (L) 135 - 145 mmol/L   Potassium 4.2 3.5 - 5.1 mmol/L   Chloride 84 (L) 101 - 111 mmol/L   CO2 29 22 - 32 mmol/L    Glucose, Bld 127 (H) 65 - 99 mg/dL   BUN 51 (H) 6 - 20 mg/dL   Creatinine, Ser 2.23 (H) 0.61 - 1.24 mg/dL   Calcium 8.6 (L) 8.9 - 10.3 mg/dL   GFR calc non Af Amer 29 (L) >60 mL/min   GFR calc Af Amer 34 (L) >60 mL/min    Comment: (NOTE) The eGFR has been calculated using the CKD EPI equation. This calculation has not been validated in all clinical situations. eGFR's persistently <60 mL/min signify possible Chronic Kidney Disease.    Anion gap 14 5 - 15  Glucose, capillary     Status: Abnormal   Collection Time: 07/30/17  6:13 AM  Result Value Ref Range   Glucose-Capillary 123 (H) 65 - 99 mg/dL  Glucose, capillary     Status: Abnormal   Collection Time: 07/30/17 11:04 AM  Result Value Ref Range   Glucose-Capillary 124 (H) 65 - 99 mg/dL   Comment 1 Notify RN   Glucose, capillary     Status: Abnormal   Collection Time: 07/30/17  4:21 PM  Result Value Ref Range   Glucose-Capillary 202 (H) 65 - 99 mg/dL   Comment 1 Notify RN   Glucose, capillary     Status: Abnormal   Collection Time: 07/30/17  9:13 PM  Result Value Ref Range   Glucose-Capillary 224 (H) 65 - 99 mg/dL  Basic metabolic panel     Status: Abnormal   Collection Time: 07/31/17  2:06 AM  Result Value Ref Range   Sodium 125 (L) 135 - 145 mmol/L   Potassium 3.9 3.5 - 5.1 mmol/L   Chloride 84 (L) 101 - 111 mmol/L   CO2 30 22 - 32 mmol/L   Glucose, Bld 179 (H) 65 - 99 mg/dL   BUN 56 (H) 6 - 20 mg/dL   Creatinine, Ser 2.14 (H) 0.61 - 1.24 mg/dL   Calcium 8.3 (L) 8.9 - 10.3 mg/dL   GFR calc non Af Amer 30 (L) >60 mL/min   GFR calc Af Amer 35 (L) >60 mL/min    Comment: (NOTE) The eGFR has been calculated using the CKD EPI equation. This calculation has not been validated in all clinical situations. eGFR's persistently <60 mL/min signify possible Chronic Kidney Disease.    Anion gap 11 5 - 15  Glucose, capillary     Status: Abnormal   Collection Time: 07/31/17  6:08 AM  Result Value Ref Range   Glucose-Capillary  181 (H) 65 - 99 mg/dL  Glucose, capillary     Status: Abnormal   Collection Time: 07/31/17 11:23 AM  Result Value Ref Range   Glucose-Capillary 249 (H) 65 - 99 mg/dL   Comment 1 Notify RN  Dg Swallowing Func-speech Pathology  Result Date: 07/30/2017 Objective Swallowing Evaluation: Type of Study: MBS-Modified Barium Swallow Study  Patient Details Name: KESHAN REHA MRN: [id number] Date of Birth: November 05, 1950 Today's Date: 07/30/2017 Time: SLP Start Time (ACUTE ONLY): 1500 -SLP Stop Time (ACUTE ONLY): 1520 SLP Time Calculation (min) (ACUTE ONLY): 20 min Past Medical History: Past Medical History: Diagnosis Date . CAD (coronary artery disease)   a. prior silent inferior MI, NSTEMI 07/2014 with chronically occluded RCA s/p unsuccessful PCI with otherwise nonobstructive residual disease . Chronic respiratory failure ([place])   a. Placed on home O2 01/2016. [name] Kitchen COPD (chronic obstructive pulmonary disease) ([place])  . Diabetes mellitus ([place])  . Former tobacco use  . Hyperlipidemia  . Hypertension  . Morbid obesity ([place])  . Neuropathy  . Sleep apnea  Past Surgical History: Past Surgical History: Procedure Laterality Date . BACK SURGERY   . CORONARY BALLOON ANGIOPLASTY N/A 07/14/2017  Procedure: CORONARY BALLOON ANGIOPLASTY;  Surgeon: Martinique, Peter M, MD;  Location: [facility];  Service: Cardiovascular;  Laterality: N/A; . INTRAVASCULAR ULTRASOUND/IVUS N/A 07/14/2017  Procedure: Intravascular Ultrasound/IVUS;  Surgeon: Martinique, Peter M, MD;  Location: [facility];  Service: Cardiovascular;  Laterality: N/A; . LEFT HEART CATH AND CORONARY ANGIOGRAPHY N/A 07/14/2017  Procedure: LEFT HEART CATH AND CORONARY ANGIOGRAPHY;  Surgeon: Martinique, Peter M, MD;  Location: [facility];  Service: Cardiovascular;  Laterality: N/A; . LEFT HEART CATHETERIZATION WITH CORONARY ANGIOGRAM N/A 08/28/2014  Procedure: LEFT HEART CATHETERIZATION WITH CORONARY ANGIOGRAM;  Surgeon: Clent Demark, MD;  Location: [facility];   Service: Cardiovascular;  Laterality: N/A; . PERIPHERAL VASCULAR CATHETERIZATION Bilateral 03/22/2016  Procedure: Carotid Angiography;  Surgeon: Elam Dutch, MD;  Location: [facility];  Service: Cardiovascular;  Laterality: Bilateral; HPI: Pt is a 66 y.o. male who presented to [facility] on 07/14/17 with chest pain and dyspnea. On arrival to [facility] ED, his EKG showed new onset A-fib with ST elevation in the inferolateral leads; CODE STEMI was called. Cardiac cath 11/15 with unsuccessful attempt at revascularization of occluded distal LAD, chronic occlusion of RCA. MRI on 07/28/17 revealed subcentimeter acute infarction in left parietal periventricular white matter Pertinent PMH includes CAD, COPD, HL, HTN, obesity,DMand tobacco use. BSE initiated due to neuro changes (onset dysarthria).  Subjective: Alert, cooperative Assessment / Plan / Recommendation CHL IP CLINICAL IMPRESSIONS 07/30/2017 Clinical Impression Patient presents with mild pharyngeal phase dysphagia due to structural anomaly, with prominent epiglottic tissue which appears to extend inferiorly. This impedes complete epiglottic deflection and laryngeal closure, resulting in intermittent transient penetration during the swallow with liquids. Mild-moderate residue (solids>liquids) in the valleculae which reduces with subsequent dry swallows, liquid wash. Recommend pt consume regular diet with thin liquids; SLP will follow briefly for tolerance and training in compensatory strategies. Pt would benefit from ENT consult to further assess epiglottic tissue, particularly as pt has been complaining of odynophagia and has a history of tobacco abuse. SLP Visit Diagnosis Dysphagia, pharyngeal phase (R13.13) Attention and concentration deficit following -- Frontal lobe and executive function deficit following -- Impact on safety and function Mild aspiration risk   CHL IP TREATMENT RECOMMENDATION 07/30/2017 Treatment Recommendations Therapy as outlined in  treatment plan below   Prognosis 07/30/2017 Prognosis for Safe Diet Advancement Good Barriers to Reach Goals -- Barriers/Prognosis Comment -- CHL IP DIET RECOMMENDATION 07/30/2017 SLP Diet Recommendations Regular solids;Thin liquid Liquid Administration via Cup;Straw Medication Administration Whole meds with liquid Compensations Follow solids with liquid;Clear throat intermittently;Multiple dry  swallows after each bite/sip;Slow rate;Small sips/bites Postural Changes Seated upright at 90 degrees   CHL IP OTHER RECOMMENDATIONS 07/30/2017 Recommended Consults Consider ENT evaluation Oral Care Recommendations Oral care BID Other Recommendations --   CHL IP FOLLOW UP RECOMMENDATIONS 07/30/2017 Follow up Recommendations Other (comment)   CHL IP FREQUENCY AND DURATION 07/30/2017 Speech Therapy Frequency (ACUTE ONLY) min 1 x/week Treatment Duration 1 week      CHL IP ORAL PHASE 07/30/2017 Oral Phase WFL Oral - Pudding Teaspoon -- Oral - Pudding Cup -- Oral - Honey Teaspoon -- Oral - Honey Cup -- Oral - Nectar Teaspoon -- Oral - Nectar Cup -- Oral - Nectar Straw -- Oral - Thin Teaspoon -- Oral - Thin Cup -- Oral - Thin Straw -- Oral - Puree -- Oral - Mech Soft -- Oral - Regular -- Oral - Multi-Consistency -- Oral - Pill -- Oral Phase - Comment --  CHL IP PHARYNGEAL PHASE 07/30/2017 Pharyngeal Phase Impaired Pharyngeal- Pudding Teaspoon -- Pharyngeal -- Pharyngeal- Pudding Cup -- Pharyngeal -- Pharyngeal- Honey Teaspoon -- Pharyngeal -- Pharyngeal- Honey Cup -- Pharyngeal -- Pharyngeal- Nectar Teaspoon -- Pharyngeal -- Pharyngeal- Nectar Cup -- Pharyngeal -- Pharyngeal- Nectar Straw -- Pharyngeal -- Pharyngeal- Thin Teaspoon -- Pharyngeal -- Pharyngeal- Thin Cup Reduced epiglottic inversion;Reduced airway/laryngeal closure;Penetration/Aspiration during swallow;Pharyngeal residue - valleculae Pharyngeal Material enters airway, remains ABOVE vocal cords then ejected out Pharyngeal- Thin Straw Reduced epiglottic inversion;Reduced  airway/laryngeal closure;Penetration/Aspiration during swallow;Pharyngeal residue - valleculae Pharyngeal Material enters airway, remains ABOVE vocal cords then ejected out Pharyngeal- Puree Reduced epiglottic inversion;Reduced airway/laryngeal closure;Pharyngeal residue - valleculae Pharyngeal -- Pharyngeal- Mechanical Soft -- Pharyngeal -- Pharyngeal- Regular Reduced epiglottic inversion;Reduced airway/laryngeal closure;Pharyngeal residue - valleculae Pharyngeal -- Pharyngeal- Multi-consistency -- Pharyngeal -- Pharyngeal- Pill Reduced epiglottic inversion;Reduced airway/laryngeal closure Pharyngeal -- Pharyngeal Comment --  CHL IP CERVICAL ESOPHAGEAL PHASE 07/30/2017 Cervical Esophageal Phase WFL Pudding Teaspoon -- Pudding Cup -- Honey Teaspoon -- Honey Cup -- Nectar Teaspoon -- Nectar Cup -- Nectar Straw -- Thin Teaspoon -- Thin Cup -- Thin Straw -- Puree -- Mechanical Soft -- Regular -- Multi-consistency -- Pill -- Cervical Esophageal Comment -- No flowsheet data found. Aliene Altes 07/30/2017, 3:52 PM Deneise Lever, MS, CCC-SLP Speech-Language Pathologist [phone]              [id number] except as listed in admit H&P  Blood pressure 126/61, pulse 64, temperature (!) 96.9 F (36.1 C), temperature source Axillary, resp. rate 14, height '6\' 1"'  (1.854 m), weight 130.5 kg (287 lb 11.2 oz), SpO2 97 %.  PHYSICAL EXAM: Overall appearance: Chronically ill-appearing obese gentleman Head:  Normocephalic, atraumatic. Ears: External ears look normal. Nose: External nose is healthy in appearance. Internal nasal exam free of any lesions or obstruction. Oral Cavity/Pharynx:  There are no mucosal lesions or masses identified. Larynx/Hypopharynx: See below  Neuro:  No identifiable cranial nerve deficits other than what is described below. Neck: No palpable neck masses.  Studies Reviewed: none  Procedures: Flexible fiberoptic laryngoscopy.  Topical Xylocaine/Afrin was applied to the nasal cavities.  The  scope was then passed through the right nasal cavity through the nasopharynx and into the hypopharynx.  The hypopharyngeal mucosa was all within normal limits without any evidence of infection or neoplasm.  The larynx revealed dry mucosa with some small areas of irritation and erythema but no actual mass identified.  The epiglottis is omega shaped.  There was debris from several of his pills that he took right before I evaluated him adhering to  the vallecular mucosa, and the supraglottic mucosa.  In the right side of the belt vallecula there is even 1 pill that was stuck there.  The cords move well.  There is no pooling of secretions.   Assessment/Plan: Based on today's evaluation, there is no evidence of infection or neoplasm in the larynx or pharynx.  He does have evidence of decreased sensation in the larynx and hypopharynx which is contributing to stasis of particulate matter from medications and likely from food as well in these areas.  Fortunately he demonstrates no evidence of aspiration.  Recommend continue with the speech pathology recommendations of multiple swallows and drinking liquids after swallowing anything.  Also recommend crushing pills whenever possible.  Follow-up as needed.  Izora Gala 07/31/2017, 12:18 PM

## 2017-07-31 NOTE — Progress Notes (Signed)
Subjective:  Complains of weakness but no chest discomfort or shortness of breath.  Still difficulty swallowing and some hoarseness.  Patient has a severe sore throat.  Objective:  Vital Signs in the last 24 hours: BP 126/61 (BP Location: Right Arm)   Pulse 64   Temp (!) 96.9 F (36.1 C) (Axillary)   Resp 14   Ht 6\' 1"  (1.854 m)   Wt 130.5 kg (287 lb 11.2 oz)   SpO2 97%   BMI 37.96 kg/m   Physical Exam: Obese white male lying in bed currently in no acute distress some hoarseness. Lungs:  Clear Cardiac:  Regular rhythm, normal S1 and S2, no S3 Extremities:  No edema present  Intake/Output from previous day: 12/01 0701 - 12/02 0700 In: 480 [P.O.:480] Out: -   Weight Filed Weights   07/29/17 0216 07/30/17 0345 07/31/17 0420  Weight: 130.6 kg (287 lb 14.7 oz) 134 kg (295 lb 6.7 oz) 130.5 kg (287 lb 11.2 oz)    Lab Results: Basic Metabolic Panel: Recent Labs    07/30/17 0226 07/31/17 0206  NA 127* 125*  K 4.2 3.9  CL 84* 84*  CO2 29 30  GLUCOSE 127* 179*  BUN 51* 56*  CREATININE 2.23* 2.14*   Telemetry: Personally reviewed.  Currently in sinus rhythm  Assessment/Plan:  1.  Previous acute myocardial infarction not able to be revascularized 2.  Acute on chronic systolic heart failure 3.  Dysphagia 4.  Paroxysmal atrial fibrillation currently in sinus rhythm 5.  Acute on chronic kidney disease fairly stable at present time 6. Orthostasis appears improved 7.  Dysphagia with some hoarseness-the recommendations of sleep study  Recommendations:  The vascular status appears stable this morning.  We'll request an ENT consult.  Try to advance activity.     Kerry Hough  MD [facility]  07/31/2017, 10:34 AM

## 2017-08-01 LAB — BASIC METABOLIC PANEL
ANION GAP: 10 (ref 5–15)
BUN: 66 mg/dL — ABNORMAL HIGH (ref 6–20)
CALCIUM: 8.5 mg/dL — AB (ref 8.9–10.3)
CO2: 31 mmol/L (ref 22–32)
Chloride: 86 mmol/L — ABNORMAL LOW (ref 101–111)
Creatinine, Ser: 2.34 mg/dL — ABNORMAL HIGH (ref 0.61–1.24)
GFR calc Af Amer: 32 mL/min — ABNORMAL LOW (ref >60)
GFR, EST NON AFRICAN AMERICAN: 27 mL/min — AB (ref >60)
GLUCOSE: 132 mg/dL — AB (ref 65–99)
Potassium: 4.1 mmol/L (ref 3.5–5.1)
SODIUM: 127 mmol/L — AB (ref 135–145)

## 2017-08-01 LAB — GLUCOSE, CAPILLARY
GLUCOSE-CAPILLARY: 126 mg/dL — AB (ref 65–99)
Glucose-Capillary: 150 mg/dL — ABNORMAL HIGH (ref 65–99)
Glucose-Capillary: 109 mg/dL — ABNORMAL HIGH (ref 65–99)
Glucose-Capillary: 102 mg/dL — ABNORMAL HIGH (ref 65–99)

## 2017-08-01 NOTE — Progress Notes (Signed)
  Speech Language Pathology Treatment: Dysphagia  Patient Details Name: Jose [name] MRN: [id number] DOB: 1951-04-09 Today's Date: 08/01/2017 Time: 6160-7371 SLP Time Calculation (min) (ACUTE ONLY): 11 min  Assessment / Plan / Recommendation Clinical Impression  Pt seen for dysphagia management following MBS 12/1. Appreciate ENT consult and results noted "no evidence of infection or neoplasm in the larynx or pharynx.  He does have evidence of decreased sensation in the larynx and hypopharynx which is contributing to stasis of particulate matter from medications and likely from food as well in these areas". Repositioned in bed and observed pt consume thin via straw with 1-2 delayed and subtle throat clears possibly indicative of penetration which he may have sensed. Significantly increased comfort when swallowing compared to Fri. Prolonged mastication of solid texture and pt states "they usually cut up my meat." Will change texture to Dys 3 to ensure meats are cut, continue thin liquids, no straws and continue ST short term.  note   HPI HPI: Pt is a 66 y.o. male who presented to [facility] on 07/14/17 with chest pain and dyspnea. On arrival to [facility] ED, his EKG showed new onset A-fib with ST elevation in the inferolateral leads; CODE STEMI was called. Cardiac cath 11/15 with unsuccessful attempt at revascularization of occluded distal LAD, chronic occlusion of RCA. MRI on 07/28/17 revealed subcentimeter acute infarction in left parietal periventricular white matter Pertinent PMH includes CAD, COPD, HL, HTN, obesity,DMand tobacco use. BSE initiated due to neuro changes (onset dysarthria).       SLP Plan  Continue with current plan of care       Recommendations  Diet recommendations: Dysphagia 3 (mechanical soft);Thin liquid Liquids provided via: Cup Medication Administration: Crushed with puree Supervision: Patient able to self feed;Full supervision/cueing for compensatory strategies;Staff to  assist with self feeding Compensations: Follow solids with liquid;Clear throat intermittently;Multiple dry swallows after each bite/sip Postural Changes and/or Swallow Maneuvers: Seated upright 90 degrees                Oral Care Recommendations: Oral care BID Follow up Recommendations: (TBD) SLP Visit Diagnosis: Dysphagia, pharyngeal phase (R13.13) Plan: Continue with current plan of care                      Houston Siren 08/01/2017, 2:40 PM  Jose [name].Ed [employer] [phone]

## 2017-08-01 NOTE — Care Management Important Message (Signed)
Important Message  Patient Details  Name: Jose [name] MRN: [id number] Date of Birth: 01/23/1951   Medicare Important Message Given:  Yes    [name] 08/01/2017, 10:40 AM

## 2017-08-01 NOTE — Progress Notes (Signed)
Progress Note  Patient Name: Jose [name] Date of Encounter: 08/01/2017  Primary Cardiologist: Harl Bowie  Subjective   Doing pretty good this morning. No chest pain. Hoping to work with PT today.  Inpatient Medications    Scheduled Meds: . amiodarone  200 mg Oral BID  . amLODipine  2.5 mg Oral Daily  . apixaban  5 mg Oral BID  . aspirin EC  81 mg Oral Daily  . clopidogrel  75 mg Oral Daily  . ezetimibe  10 mg Oral Daily  . ferrous sulfate  325 mg Oral Q breakfast  . hydrALAZINE  50 mg Oral Q8H  . Influenza vac split quadrivalent PF  0.5 mL Intramuscular Tomorrow-1000  . insulin aspart  0-5 Units Subcutaneous QHS  . insulin aspart  0-9 Units Subcutaneous TID WC  . insulin aspart  8 Units Subcutaneous TID WC  . insulin glargine  40 Units Subcutaneous BID  . isosorbide mononitrate  30 mg Oral Daily  . magnesium oxide  400 mg Oral Daily  . metoprolol succinate  12.5 mg Oral BID  . omega-3 acid ethyl esters  2 g Oral Q breakfast  . omega-3 acid ethyl esters  3 g Oral BID AC  . polyethylene glycol  17 g Oral BID  . pravastatin  20 mg Oral q1800  . sertraline  100 mg Oral Daily  . sodium chloride  3 mL Nebulization BID  . tiotropium  18 mcg Inhalation QHS   Continuous Infusions:  PRN Meds: acetaminophen, albuterol, lidocaine, lidocaine, lidocaine-EPINEPHrine, morphine injection, nitroGLYCERIN, ondansetron (ZOFRAN) IV, oxyCODONE-acetaminophen, oxymetazoline, silver nitrate applicators, TRIPLE ANTIBIOTIC   Vital Signs    Vitals:   08/01/17 0750 08/01/17 0814 08/01/17 1001 08/01/17 1111  BP:  139/63 (!) 122/59   Pulse: (!) 55 60 60 (!) 55  Resp:    16  Temp:      TempSrc:      SpO2:    99%  Weight:      Height:        Intake/Output Summary (Last 24 hours) at 08/01/2017 1132 Last data filed at 08/01/2017 1324 Gross per 24 hour  Intake 480 ml  Output 300 ml  Net 180 ml   Filed Weights   07/30/17 0345 07/31/17 0420 08/01/17 0400  Weight: 295 lb 6.7 oz (134 kg) 287  lb 11.2 oz (130.5 kg) 287 lb 0.6 oz (130.2 kg)    Telemetry    SR - Personally Reviewed  Physical Exam   General: Obese older W male appearing in no acute distress. Head: Normocephalic, atraumatic.  Neck: Supple without bruits, JVD. Lungs:  Resp regular and unlabored, diminished bilaterally. Heart: RRR, S1, S2, no murmur; no rub. Abdomen: Soft, non-tender, non-distended with normoactive bowel sounds.  Extremities: No clubbing, cyanosis, 1+ LE edema. Distal pedal pulses are 2+ bilaterally. Neuro: Alert and oriented X 3. Moves all extremities spontaneously. Psych: Normal affect.  Labs    Chemistry Recent Labs  Lab 07/30/17 0226 07/31/17 0206 08/01/17 0633  NA 127* 125* 127*  K 4.2 3.9 4.1  CL 84* 84* 86*  CO2 '29 30 31  ' GLUCOSE 127* 179* 132*  BUN 51* 56* 66*  CREATININE 2.23* 2.14* 2.34*  CALCIUM 8.6* 8.3* 8.5*  GFRNONAA 29* 30* 27*  GFRAA 34* 35* 32*  ANIONGAP '14 11 10     ' Hematology Recent Labs  Lab 07/27/17 0405 07/28/17 0247  WBC 8.9 9.8  RBC 3.68* 3.95*  HGB 10.2* 10.8*  HCT 31.7* 33.8*  MCV  86.1 85.6  MCH 27.7 27.3  MCHC 32.2 32.0  RDW 14.5 14.5  PLT 227 251    Cardiac EnzymesNo results for input(s): TROPONINI in the last 168 hours. No results for input(s): TROPIPOC in the last 168 hours.   BNPNo results for input(s): BNP, PROBNP in the last 168 hours.   DDimer No results for input(s): DDIMER in the last 168 hours.    Radiology    Dg Swallowing Func-speech Pathology  Result Date: 07/30/2017 Objective Swallowing Evaluation: Type of Study: MBS-Modified Barium Swallow Study  Patient Details Name: Jose [name] MRN: [id number] Date of Birth: 1951/06/06 Today's Date: 07/30/2017 Time: SLP Start Time (ACUTE ONLY): 1500 -SLP Stop Time (ACUTE ONLY): 1520 SLP Time Calculation (min) (ACUTE ONLY): 20 min Past Medical History: Past Medical History: Diagnosis Date . CAD (coronary artery disease)   a. prior silent inferior MI, NSTEMI 07/2014 with chronically  occluded RCA s/p unsuccessful PCI with otherwise nonobstructive residual disease . Chronic respiratory failure ([place])   a. Placed on home O2 01/2016. [name] Kitchen COPD (chronic obstructive pulmonary disease) ([place])  . Diabetes mellitus ([place])  . Former tobacco use  . Hyperlipidemia  . Hypertension  . Morbid obesity ([place])  . Neuropathy  . Sleep apnea  Past Surgical History: Past Surgical History: Procedure Laterality Date . BACK SURGERY   . CORONARY BALLOON ANGIOPLASTY N/A 07/14/2017  Procedure: CORONARY BALLOON ANGIOPLASTY;  Surgeon: Martinique, Peter M, MD;  Location: [facility];  Service: Cardiovascular;  Laterality: N/A; . INTRAVASCULAR ULTRASOUND/IVUS N/A 07/14/2017  Procedure: Intravascular Ultrasound/IVUS;  Surgeon: Martinique, Peter M, MD;  Location: [facility];  Service: Cardiovascular;  Laterality: N/A; . LEFT HEART CATH AND CORONARY ANGIOGRAPHY N/A 07/14/2017  Procedure: LEFT HEART CATH AND CORONARY ANGIOGRAPHY;  Surgeon: Martinique, Peter M, MD;  Location: [facility];  Service: Cardiovascular;  Laterality: N/A; . LEFT HEART CATHETERIZATION WITH CORONARY ANGIOGRAM N/A 08/28/2014  Procedure: LEFT HEART CATHETERIZATION WITH CORONARY ANGIOGRAM;  Surgeon: Clent Demark, MD;  Location: [facility];  Service: Cardiovascular;  Laterality: N/A; . PERIPHERAL VASCULAR CATHETERIZATION Bilateral 03/22/2016  Procedure: Carotid Angiography;  Surgeon: Elam Dutch, MD;  Location: [facility];  Service: Cardiovascular;  Laterality: Bilateral; HPI: Pt is a 66 y.o. male who presented [facility] on [facility]14[facility]17 with chest pain and dyspnea. On arrival to [facility], his EKG showed new onset A-fib with ST elevation in the inferolateral leads; CODE STEMI was called. Cardiac cath 11/15 with unsuccessful attempt at revascularization of occluded distal LAD, chronic occlusion of RCA. MRI on 07/28/17 revealed subcentimeter acute infarction in left parietal periventricular white matter Pertinent PMH includes CAD, COPD, HL, HTN,  obesity,DMand tobacco use. BSE initiated due to neuro changes (onset dysarthria).  Subjective: Alert, cooperative Assessment / Plan / Recommendation CHL IP CLINICAL IMPRESSIONS 07/30/2017 Clinical Impression Patient presents with mild pharyngeal phase dysphagia due to structural anomaly, with prominent epiglottic tissue which appears to extend inferiorly. This impedes complete epiglottic deflection and laryngeal closure, resulting in intermittent transient penetration during the swallow with liquids. Mild-moderate residue (solids>liquids) in the valleculae which reduces with subsequent dry swallows, liquid wash. Recommend pt consume regular diet with thin liquids; SLP will follow briefly for tolerance and training in compensatory strategies. Pt would benefit from ENT consult to further assess epiglottic tissue, particularly as pt has been complaining of odynophagia and has a history of tobacco abuse. SLP Visit Diagnosis Dysphagia, pharyngeal phase (R13.13) Attention and concentration deficit following -- Frontal lobe and executive  function deficit following -- Impact on safety and function Mild aspiration risk   CHL IP TREATMENT RECOMMENDATION 07/30/2017 Treatment Recommendations Therapy as outlined in treatment plan below   Prognosis 07/30/2017 Prognosis for Safe Diet Advancement Good Barriers to Reach Goals -- Barriers/Prognosis Comment -- CHL IP DIET RECOMMENDATION 07/30/2017 SLP Diet Recommendations Regular solids;Thin liquid Liquid Administration via Cup;Straw Medication Administration Whole meds with liquid Compensations Follow solids with liquid;Clear throat intermittently;Multiple dry swallows after each bite/sip;Slow rate;Small sips/bites Postural Changes Seated upright at 90 degrees   CHL IP OTHER RECOMMENDATIONS 07/30/2017 Recommended Consults Consider ENT evaluation Oral Care Recommendations Oral care BID Other Recommendations --   CHL IP FOLLOW UP RECOMMENDATIONS 07/30/2017 Follow up Recommendations Other  (comment)   CHL IP FREQUENCY AND DURATION 07/30/2017 Speech Therapy Frequency (ACUTE ONLY) min 1 x/week Treatment Duration 1 week      CHL IP ORAL PHASE 07/30/2017 Oral Phase WFL Oral - Pudding Teaspoon -- Oral - Pudding Cup -- Oral - Honey Teaspoon -- Oral - Honey Cup -- Oral - Nectar Teaspoon -- Oral - Nectar Cup -- Oral - Nectar Straw -- Oral - Thin Teaspoon -- Oral - Thin Cup -- Oral - Thin Straw -- Oral - Puree -- Oral - Mech Soft -- Oral - Regular -- Oral - Multi-Consistency -- Oral - Pill -- Oral Phase - Comment --  CHL IP PHARYNGEAL PHASE 07/30/2017 Pharyngeal Phase Impaired Pharyngeal- Pudding Teaspoon -- Pharyngeal -- Pharyngeal- Pudding Cup -- Pharyngeal -- Pharyngeal- Honey Teaspoon -- Pharyngeal -- Pharyngeal- Honey Cup -- Pharyngeal -- Pharyngeal- Nectar Teaspoon -- Pharyngeal -- Pharyngeal- Nectar Cup -- Pharyngeal -- Pharyngeal- Nectar Straw -- Pharyngeal -- Pharyngeal- Thin Teaspoon -- Pharyngeal -- Pharyngeal- Thin Cup Reduced epiglottic inversion;Reduced airway/laryngeal closure;Penetration/Aspiration during swallow;Pharyngeal residue - valleculae Pharyngeal Material enters airway, remains ABOVE vocal cords then ejected out Pharyngeal- Thin Straw Reduced epiglottic inversion;Reduced airway/laryngeal closure;Penetration/Aspiration during swallow;Pharyngeal residue - valleculae Pharyngeal Material enters airway, remains ABOVE vocal cords then ejected out Pharyngeal- Puree Reduced epiglottic inversion;Reduced airway/laryngeal closure;Pharyngeal residue - valleculae Pharyngeal -- Pharyngeal- Mechanical Soft -- Pharyngeal -- Pharyngeal- Regular Reduced epiglottic inversion;Reduced airway/laryngeal closure;Pharyngeal residue - valleculae Pharyngeal -- Pharyngeal- Multi-consistency -- Pharyngeal -- Pharyngeal- Pill Reduced epiglottic inversion;Reduced airway/laryngeal closure Pharyngeal -- Pharyngeal Comment --  CHL IP CERVICAL ESOPHAGEAL PHASE 07/30/2017 Cervical Esophageal Phase WFL Pudding Teaspoon --  Pudding Cup -- Honey Teaspoon -- Honey Cup -- Nectar Teaspoon -- Nectar Cup -- Nectar Straw -- Thin Teaspoon -- Thin Cup -- Thin Straw -- Puree -- Mechanical Soft -- Regular -- Multi-consistency -- Pill -- Cervical Esophageal Comment -- No flowsheet data found. Aliene Altes 07/30/2017, 3:52 PM Deneise Lever, MS, CCC-SLP Speech-Language Pathologist [phone]              Cardiac Studies   Cardiac cath July 14, 2017 -unsuccessful attempted PTCA of occluded distal LAD, chronic occlusion of RCA Diagnostic Diagram       Post-Intervention Diagram        Echo July 14, 2017 - Left ventricle: LVEF is approximately 40% with akinesis of the distal inferoseptal wall and apex. Aneurysmal dilatation of apex. Consider limited echo with contrast to evaluate for thrombus. The cavity size was normal. Wall thickness was increased in a pattern of mild LVH. - Mitral valve: Calcified annulus. Mildly thickened leaflets   Patient Profile     66 y.o. male presenting with extensive anteroapical and inferior infarction, unsuccessful attempt at revascularization of occluded distal LAD, chronic occlusion of RCA.  Moderate reduction inventricular systolic  function. Atrial fibrillation with rapid ventricular response present on arrival, resolved with amiodarone. Moderate acute renal failure related to contrast nephrotoxicity/retention, improving.   Assessment & Plan    1. CAD s/p STEMI:Unsuccessful PCI to distal LAD occlusion and not optimal surgical candidate at this time; echo consistent with aneurysmal dilation of apex. No recurrent chest pain.  --asa 63m daily x 1 month, plavix 776mdaily, atorvastatin 4012maily, lopresor 12.5mg49mD --Eliquis for LV dilation  2. Acute on chronic systolic CHF: --Ins/outs not accurately recorded; weights inaccurate; still having significant LE and sacral edema. --holding on starting ACE-I  And lasix to to worsening renal function. Appears  volume stable on exam --metoprolol 12.5mg 86m  3. AFib: Maintaining sinus rhythm. Plan current dose of amiodarone 4 weeks (200mg 53m, then decrease to maintenance dose of 200 mg daily CHADSVasc 5 (age, hypertension, diabetes, heart failure, CAD). --amiodarone 200mg B50m-Eliquis   4. Episodes of weakness/  acute infarction in left parietal periventricular white matter on MRI: -- Does not have clear seizure like activity or stroke like symptoms. He does have known L ICA stenosis   -- MRI 07/28/17 revealed subcentimeter acute infarction in left parietal periventricular white matter.     -- MRA 07/29/17  Showed Interval occlusion of the left internal carotid artery with absence of antegrade flow at the skullbase. Approximately 70% stenosis of the right vertebral artery at the foramen magnum level.    -- Appreciate neurology recommendations, allowing for permissive hypertension with systolic [id number] - stable  6. Acute renal failure on CKD stage III/ hyponatramia - Cr 2.34 stable. Na+ 127  7. OSA: --OnCPAPqhs.  8. DM: --on SSI   Dispo: Overall deconditioned, working with PT. Will need outpatient rehab. Social worker has met with family and looking for rehab options. Will also need to determine whether needs home O2.   Signed, LindsayReino Bellis2/10/2016, 11:32 AM  Pager # [phone] or updates, please contact [facility] consult [email or web address] for contact info under Cardiology/STEMI.

## 2017-08-02 LAB — BASIC METABOLIC PANEL
Anion gap: 16 — ABNORMAL HIGH (ref 5–15)
BUN: 71 mg/dL — AB (ref 6–20)
CALCIUM: 8.1 mg/dL — AB (ref 8.9–10.3)
CO2: 20 mmol/L — ABNORMAL LOW (ref 22–32)
CREATININE: 2.36 mg/dL — AB (ref 0.61–1.24)
Chloride: 84 mmol/L — ABNORMAL LOW (ref 101–111)
GFR calc non Af Amer: 27 mL/min — ABNORMAL LOW (ref >60)
GFR, EST AFRICAN AMERICAN: 31 mL/min — AB (ref >60)
GLUCOSE: 113 mg/dL — AB (ref 65–99)
Potassium: 4.9 mmol/L (ref 3.5–5.1)
Sodium: 120 mmol/L — ABNORMAL LOW (ref 135–145)

## 2017-08-02 LAB — URINALYSIS, ROUTINE W REFLEX MICROSCOPIC
Bilirubin Urine: NEGATIVE
Glucose, UA: NEGATIVE mg/dL
Ketones, ur: NEGATIVE mg/dL
LEUKOCYTES UA: NEGATIVE
NITRITE: NEGATIVE
PH: 5 (ref 5.0–8.0)
Protein, ur: 100 mg/dL — AB
SPECIFIC GRAVITY, URINE: 1.014 (ref 1.005–1.030)

## 2017-08-02 LAB — GLUCOSE, CAPILLARY
GLUCOSE-CAPILLARY: 161 mg/dL — AB (ref 65–99)
GLUCOSE-CAPILLARY: 127 mg/dL — AB (ref 65–99)
Glucose-Capillary: 146 mg/dL — ABNORMAL HIGH (ref 65–99)
Glucose-Capillary: 123 mg/dL — ABNORMAL HIGH (ref 65–99)
Glucose-Capillary: 81 mg/dL (ref 65–99)

## 2017-08-02 LAB — OSMOLALITY, URINE: OSMOLALITY UR: 353 mosm/kg (ref 300–900)

## 2017-08-02 MED ORDER — FUROSEMIDE 10 MG/ML IJ SOLN
40.0000 mg | Freq: Two times a day (BID) | INTRAMUSCULAR | Status: DC
  Administered 2017-08-02 – 2017-08-05 (×7): 40 mg via INTRAVENOUS
  Filled 2017-08-02 (×8): qty 4

## 2017-08-02 MED ORDER — HYDRALAZINE HCL 25 MG PO TABS
25.0000 mg | ORAL_TABLET | Freq: Three times a day (TID) | ORAL | Status: DC
  Administered 2017-08-02 – 2017-08-06 (×11): 25 mg via ORAL
  Filled 2017-08-02 (×13): qty 1

## 2017-08-02 NOTE — Progress Notes (Signed)
Patient has home CPAP unit at bedside. States he does not wish to wear it tonight.

## 2017-08-02 NOTE — Progress Notes (Signed)
CSW following patient and family for support and discharge needs. Patients spouse stated she has chosen for patient to discharge to [facility] upon discharge. CSW spoke with the admissions coordinator Mardene Celeste and she stated the facility has a bed available for patient once medically cleared by MD.   Rhea Pink, MSW,  [place]

## 2017-08-02 NOTE — Progress Notes (Signed)
Occupational Therapy Treatment Patient Details Name: Jose [name] MRN: [id number] DOB: 1950/09/23 Today's Date: 08/02/2017    History of present illness Pt is a 66 y.o. male who presented to [facility] on 07/14/17 with chest pain and dyspnea. On arrival to [facility], his EKG showed new onset A-fib with ST elevation in the inferolateral leads; CODE STEMI was called. Cardiac cath 11/15 with unsuccessful attempt at revascularization of occluded distal LAD, chronic occlusion of RCA. MRI on 07/28/17 revealed Subcentimeter acute infarction in left parietal periventricular white matter Pertinent PMH includes CAD (CTO of RCA, with residual disease in LAD/Lcx), COPD, HL, HTN, obesity,DMand tobacco use.    OT comments  Pt demonstrating some progress toward OT goals. He was more motivated to participate this session. Pt demonstrating decreased attention to task and very easily distracted making him unsafe. Also noted pt with quick fatigue bilaterally with fine motor and more distal tasks as well as poor in-hand manipulation skills. Pt requiring mod assist +2 with close chair follow for simulated toilet transfers. He did have one episode with LOB, dizziness, and feeling as though he "lost time." Current goals remain appropriate and updated time frame for achievement as pt making progress slowly. SNF remains appropriate for D/C recommendation.    Follow Up Recommendations  SNF;Supervision/Assistance - 24 hour    Equipment Recommendations  Other (comment)(defer to next venue of care)    Recommendations for Other Services      Precautions / Restrictions Precautions Precautions: Fall Precaution Comments: intermittent weakness with dizziness Restrictions Weight Bearing Restrictions: No       Mobility Bed Mobility Overal bed mobility: Needs Assistance Bed Mobility: Supine to Sit;Sit to Supine Rolling: Min guard   Supine to sit: Min guard     General bed mobility comments: Min guard assist for safety.    Transfers Overall transfer level: Needs assistance Equipment used: Rolling walker (2 wheeled) Transfers: Sit to/from Stand Sit to Stand: Min assist;+2 physical assistance;From elevated surface;Mod assist         General transfer comment: Up to mod assist for power up, balance, and safety. One LOB when standing from bed; pt stated he felt dizzy and lost time for a second. Min assist +2 to help pt regain balance. VCs to push off from and reach back to chair during transfer. Pt transferring with both hands on RW. When asked if he knew where his hands needed to go he said "I really don't know." Last sit to stand required Min A +1.     Balance Overall balance assessment: Needs assistance Sitting-balance support: Feet supported;Bilateral upper extremity supported Sitting balance-Leahy Scale: Fair     Standing balance support: Bilateral upper extremity supported Standing balance-Leahy Scale: Poor Standing balance comment: Relied on RW support this session.                            ADL either performed or assessed with clinical judgement   ADL Overall ADL's : Needs assistance/impaired Eating/Feeding: Sitting;Minimal assistance Eating/Feeding Details (indicate cue type and reason): Will begin task well but quickly fatigues and drops objects.                      Toilet Transfer: RW;Cueing for sequencing;Cueing for safety;Ambulation;+2 for physical assistance;Moderate assistance Toilet Transfer Details (indicate cue type and reason): Cueing for attention. Close chair follow.          Functional mobility during ADLs: +2 for physical  assistance;Rolling walker;Moderate assistance General ADL Comments: Very poor attention. Assessed B UE and noted decreased coordination and B hands easily fatigue.      Vision   Vision Assessment?: No apparent visual deficits   Perception     Praxis      Cognition Arousal/Alertness: Awake/alert Behavior During Therapy: Flat  affect Overall Cognitive Status: Difficult to assess Area of Impairment: Attention;Following commands;Awareness;Problem solving                   Current Attention Level: Sustained   Following Commands: Follows one step commands with increased time;Follows one step commands inconsistently   Awareness: Emergent Problem Solving: Slow processing;Requires verbal cues General Comments: Pt with intermittent ability to follow commands. Unable to maintain attention to tasks this sesison. Will begin task and quickly forget what he is doing.         Exercises     Shoulder Instructions       General Comments wife present and engaged in session    Pertinent Vitals/ Pain       Pain Assessment: Faces Faces Pain Scale: Hurts little more Pain Location: bilateral legs and back  Pain Descriptors / Indicators: Discomfort Pain Intervention(s): Monitored during session;Repositioned  Home Living                                          Prior Functioning/Environment              Frequency  Min 2X/week        Progress Toward Goals  OT Goals(current goals can now be found in the care plan section)  Progress towards OT goals: Progressing toward goals  Acute Rehab OT Goals Patient Stated Goal: eat lunch OT Goal Formulation: With patient Time For Goal Achievement: 08/16/17 Potential to Achieve Goals: Fair  Plan Frequency remains appropriate;Discharge plan remains appropriate    Co-evaluation    PT/OT/SLP Co-Evaluation/Treatment: Yes Reason for Co-Treatment: For patient/therapist safety;Complexity of the patient's impairments (multi-system involvement)   OT goals addressed during session: ADL's and self-care      AM-PAC PT "6 Clicks" Daily Activity     Outcome Measure   Help from another person eating meals?: A Little Help from another person taking care of personal grooming?: A Little Help from another person toileting, which includes using  toliet, bedpan, or urinal?: A Lot Help from another person bathing (including washing, rinsing, drying)?: A Little Help from another person to put on and taking off regular upper body clothing?: A Little Help from another person to put on and taking off regular lower body clothing?: A Lot 6 Click Score: 16    End of Session Equipment Utilized During Treatment: Oxygen;Gait belt;Rolling walker  OT Visit Diagnosis: Unsteadiness on feet (R26.81);Other abnormalities of gait and mobility (R26.89);Muscle weakness (generalized) (M62.81);Other symptoms and signs involving cognitive function   Activity Tolerance Patient tolerated treatment well   Patient Left in chair(with PT finishing session in hallway)   Nurse Communication Mobility status        Time: 1140-1210 OT Time Calculation (min): 30 min  Charges: OT General Charges $OT Visit: 1 Visit OT Treatments $Therapeutic Activity: 8-22 mins  Norman Herrlich, MS OTR/L  Pager: [place] A Joany Khatib 08/02/2017, 2:56 PM

## 2017-08-02 NOTE — Progress Notes (Signed)
Physical Therapy Treatment Patient Details Name: DELLIS VOGHT MRN: [id number] DOB: Feb 11, 1951 Today's Date: 08/02/2017    History of Present Illness Pt is a 66 y.o. male who presented to [facility] on 07/14/17 with chest pain and dyspnea. On arrival to [facility] ED, his EKG showed new onset A-fib with ST elevation in the inferolateral leads; CODE STEMI was called. Cardiac cath 11/15 with unsuccessful attempt at revascularization of occluded distal LAD, chronic occlusion of RCA. MRI on 07/28/17 revealed Subcentimeter acute infarction in left parietal periventricular white matter Pertinent PMH includes CAD (CTO of RCA, with residual disease in LAD/Lcx), COPD, HL, HTN, obesity,DMand tobacco use. MRI 11/29 revealed pt had an acute infarction in left parietal periventricular white matter.    PT Comments    Pt seated EOB with OT upon arrival. Pt required min assist +2 for sit to stand power up and balance. Multiple verbal and tactile cues to push up from bed or arms of chair. Pt had LOB after standing while having BP taken. Said he felt dizzy and blacked out for a second. Pt seated BP was 138/69 and was 136/55 upon standing. Pt required assist to regain balance. After seated rest break on EOB pt felt better and wanted to ambulate. Pt able to ambulate 10 feet before taking seated rest break. Pt then able to ambulate out into the hallway for 15 feet before fatigued. Pt impulsive. Walks with wide BOS; VCs to narrow it and walk with his LEs inside the walker. VCs for posture. Pt would benefit from skilled PT to improve endurance and strength to increase functional mobility.  Follow Up Recommendations  Supervision for mobility/OOB;SNF     Equipment Recommendations  None recommended by PT    Recommendations for Other Services OT consult     Precautions / Restrictions Precautions Precautions: Fall Precaution Comments: , syncope/ pre syncope Restrictions Weight Bearing Restrictions: No    Mobility  Bed  Mobility Overal bed mobility: Needs Assistance             General bed mobility comments: Pt sitting EOB when SPTA joined co-treat.  Transfers Overall transfer level: Needs assistance Equipment used: Rolling walker (2 wheeled) Transfers: Sit to/from Stand Sit to Stand: Min assist;+2 physical assistance;From elevated surface         General transfer comment: Min assist for power up, balance, and safety. One LOB when standing from bed; pt stated he felt dizzy and lost time for a second. Min assist +2 to help pt regain balance. VCs to push off from and reach back to chair during transfer. Pt transferring with both hands on RW. When asked if he knew where his hands needed to go he said "I really don't know." Last sit to stand required Min A +1.   Ambulation/Gait Ambulation/Gait assistance: Min assist;+2 physical assistance(+3 chair follow) Ambulation Distance (Feet): 10 Feet(10 ft first trial; 15 ft second trial) Assistive device: Rolling walker (2 wheeled) Gait Pattern/deviations: Step-to pattern;Wide base of support;Shuffle Gait velocity: Decreased   General Gait Details: Pt unsteady on feet. Wide BOS requiring VCs to narrow BOS so it would fit in RW. VCs to stand closer to walker and maintain erect posture. Pt fatigued from ambulation.   Stairs            Wheelchair Mobility    Modified Rankin (Stroke Patients Only)       Balance Overall balance assessment: Needs assistance Sitting-balance support: Feet supported;Bilateral upper extremity supported Sitting balance-Leahy Scale: Fair  Standing balance support: Bilateral upper extremity supported Standing balance-Leahy Scale: Poor Standing balance comment: Relied on RW support this session.                             Cognition Arousal/Alertness: Awake/alert Behavior During Therapy: Flat affect Overall Cognitive Status: Difficult to assess                         Following Commands:  Follows one step commands with increased time;Follows one step commands inconsistently     Problem Solving: Slow processing;Requires verbal cues General Comments: Pt does not answer questions or follow commands consistently. Pt is "joker" at baseline, so its hard to pin point if he is trying to be funny, or cannot follow commands.      Exercises      General Comments        Pertinent Vitals/Pain Pain Assessment: No/denies pain Pain Intervention(s): Monitored during session;Repositioned    Home Living                      Prior Function            PT Goals (current goals can now be found in the care plan section) Acute Rehab PT Goals Patient Stated Goal: none stated PT Goal Formulation: With patient Time For Goal Achievement: 08/01/17 Potential to Achieve Goals: Good Progress towards PT goals: Progressing toward goals    Frequency    Min 3X/week      PT Plan Current plan remains appropriate    Co-evaluation PT/OT/SLP Co-Evaluation/Treatment: Yes Reason for Co-Treatment: For patient/therapist safety;Complexity of the patient's impairments (multi-system involvement)          AM-PAC PT "6 Clicks" Daily Activity  Outcome Measure  Difficulty turning over in bed (including adjusting bedclothes, sheets and blankets)?: None Difficulty moving from lying on back to sitting on the side of the bed? : Unable Difficulty sitting down on and standing up from a chair with arms (e.g., wheelchair, bedside commode, etc,.)?: Unable Help needed moving to and from a bed to chair (including a wheelchair)?: A Lot Help needed walking in hospital room?: A Lot Help needed climbing 3-5 steps with a railing? : Total 6 Click Score: 11    End of Session Equipment Utilized During Treatment: Gait belt;Oxygen Activity Tolerance: Patient limited by fatigue Patient left: in chair;with call bell/phone within reach;with family/visitor present Nurse Communication: Mobility status PT  Visit Diagnosis: Other abnormalities of gait and mobility (R26.89)     Time: 7322-0254 PT Time Calculation (min) (ACUTE ONLY): 28 min  Charges:  $Gait Training: 8-22 mins                    G Codes:       Janna Arch, SPTA   Janna Arch 08/02/2017, 1:29 PM

## 2017-08-02 NOTE — Progress Notes (Addendum)
PROGRESS NOTE    Jose [name]  RCV:[id number] DOB: July 21, 1951 DOA: 07/14/2017 PCP: Cleophas Dunker, MD    Brief Narrative:  66 year old male who presents with chest pain and dyspnea. He is known to have coronary artery disease, COPD, dyslipidemia, type 2 diabetes mellitus, hypertension, obesity and tobacco abuse. He had chest pain intermittently for last 3 days prior to hospitalization, associated with dyspnea on exertion. Due to his persistent symptoms he was brought to the hospital, he was found in atrial fibrillation and his electrocardiogram showed ST elevations in the inferior lateral leads, his troponin was 20, and he was immediately taken to the cardiac catheterization lab. On his initial physical examination blood pressure 115/57, heart rate 52, temperature 98.9, oxygen saturation 93% his lungs were clear to auscultation bilaterally heart S1-S2 present, irregularly irregular no S3 or S4 gallop, no murmurs, his abdomen was soft nontender, 1+ lower extremity edema  Cardiac catheterization he was found to have total occlusion of the mid and apical LAD, he underwent Unsuccessful PCI to the LAD. He also was found to have chronic occlusion of the RCA, ejection fraction is 40%. Pt developed acute kidney injury. He was placed on amiodarone and started on aggressive medical therapy for his coronary artery disease. Likely not a good surgical candidate. MRI 07/28/17 revealed subcentimeter acute infarction in left parietal periventricular white matter. Neurology also following. Worsening renal function and hyponatremia. On fluid restriction, hypervolemic.   Overall, pt is very deconditioned, will need SNF placement. Pt will also need to be seen by nephrology due to worsening hyponatremia, consult placed/paged. Neurology, Dr Leonie Man, Mamie Nick was paged today 08/02/17 as pt and the wife had concerns of ongoing intermittent confusion, poor grip, as pt continues to drop things he holds on to. [facility]  team will sign off, please call if there are any questions or concerns.   Assessment & Plan:   Principal Problem:   STEMI (ST elevation myocardial infarction) ([place]) Active Problems:   COPD (chronic obstructive pulmonary disease) (HCC)   HTN (hypertension)   Diabetes (HCC)   Morbid obesity (HCC)   OSA (obstructive sleep apnea)   Chronic diastolic CHF (congestive heart failure) (HCC)   Atrial fibrillation with rapid ventricular response (HCC)   STEMI involving left anterior descending coronary artery (HCC)   Acute on chronic systolic CHF (congestive heart failure), NYHA class 3 (HCC)   Coronary artery disease involving native coronary artery of native heart with unstable angina pectoris (HCC)   PAF (paroxysmal atrial fibrillation) (HCC)   CRI (chronic renal insufficiency), stage 3 (moderate) ([place])   Lacunar infarction   Carotid occlusion, left   Hyponatremia   1. Coronary artery disease status post ST elevation myocardial infarction. -aspirin, clopidogrel, metoprolol, hydralazine,isosorbide -reported allergy to statins  2. Atrial fibrillation, new onset. -rate control metoprolol and amiodarone -CHADsVASC score 5 -eliquis for cva prophylaxis  3. Chronic fatigue/weakness/deconditioning -possibly multifactorial from deconditioning vs recent STEMI vs chronic hyponatremia -continue PT, SNF placement -monitor Na+ level  4. Chronic hyponatremia, hypervolemic -Baseline 127-130 since a year ago\ -Na+120 -Fluid restriction less than 1200 cc per day -monitor urine output -Nephrology consulted/paged on 08/02/17  5. Systolic heart failure, acute on chronic,LV EF 40%, with akinesis of the distal inferior-septal wall and apex. -fluid restriction -strict I&O's daily weight -continue core measures asa, metoprolol, allergic to statin  6. Acute kidney injury on chronic kidney disease stage III. -worsening, 2.36 -baseline cr.1.44 -avoid nephrotoxic med, nephrology  consulted -BMP am  7. Chronic normocytic anemia -stable -no  sign of bleeding -hg 10.7 mcv 85 -ferrous sulfate -cbc am  8. Type 2 diabetes mellitus. -basal insulin to 40 units bid, 8 units of aspart ac.  -ISS with hypoglycemic protocol -A1C 7.9 (07/15/17)  9. HTN. -stable -metoprolol, hydralazine and isosorbide.   10. COPD. -No acuteexacerbation. -albuterolandtiotropium.  11. Chronic depression/anxiety -zoloft -ativan IV 1 mg prn  12. Chronic constipation -miralax  13. OSA -CPAP at night  DVT prophylaxis:apixaban Code Status:full Family Communication:wife at bedside.  Disposition Plan:CIR   Consultants:  [facility]  Neurology  Nephrology  Procedures:  Cardiac catheterization    Objective: Vitals:   08/02/17 0804 08/02/17 0902 08/02/17 0920 08/02/17 1118  BP: (!) 150/59  (!) 125/57   Pulse: 61  60 (!) 59  Resp: 17     Temp:      TempSrc:      SpO2:  100%  99%  Weight:      Height:        Intake/Output Summary (Last 24 hours) at 08/02/2017 1220 Last data filed at 08/01/2017 2326 Gross per 24 hour  Intake 240 ml  Output 800 ml  Net -560 ml   Filed Weights   07/31/17 0420 08/01/17 0400 08/02/17 0606  Weight: 130.5 kg (287 lb 11.2 oz) 130.2 kg (287 lb 0.6 oz) 132.6 kg (292 lb 5.3 oz)    Examination:   General: 66 yo CM obese, NAD. A&O x3 Neurology: Awake and alert, non focal  EENT: mild pallor, no icterus, oral mucosa moist Cardiovascular: No JVD. S1-S2 present, rhythmic, no gallops, rubs, or murmurs. 2+ pitting edema LE bilaterally. Pulmonary: mild decreased breath sounds bilaterally at bases, adequate air movement, no wheezing, rhonchi or rales. Gastrointestinal. Abdomen morbidly obese, no organomegaly, non tender, no rebound or guarding Skin. No open lesions noted. Musculoskeletal: no joint deformities     Data Reviewed: I have personally reviewed following labs and imaging studies  CBC: Recent Labs  Lab  07/27/17 0405 07/28/17 0247  WBC 8.9 9.8  HGB 10.2* 10.8*  HCT 31.7* 33.8*  MCV 86.1 85.6  PLT 227 650   Basic Metabolic Panel: Recent Labs  Lab 07/29/17 0232 07/30/17 0226 07/31/17 0206 08/01/17 0633 08/02/17 0223  NA 124* 127* 125* 127* 120*  K 4.4 4.2 3.9 4.1 4.9  CL 84* 84* 84* 86* 84*  CO2 29 29 30 31  20*  GLUCOSE 103* 127* 179* 132* 113*  BUN 48* 51* 56* 66* 71*  CREATININE 2.25* 2.23* 2.14* 2.34* 2.36*  CALCIUM 8.7* 8.6* 8.3* 8.5* 8.1*   GFR: Estimated Creatinine Clearance: 44 mL/min (A) (by C-G formula based on SCr of 2.36 mg/dL (H)). Liver Function Tests: No results for input(s): AST, ALT, ALKPHOS, BILITOT, PROT, ALBUMIN in the last 168 hours. No results for input(s): LIPASE, AMYLASE in the last 168 hours. No results for input(s): AMMONIA in the last 168 hours. Coagulation Profile: No results for input(s): INR, PROTIME in the last 168 hours. Cardiac Enzymes: No results for input(s): CKTOTAL, CKMB, CKMBINDEX, TROPONINI in the last 168 hours. BNP (last 3 results) No results for input(s): PROBNP in the last 8760 hours. HbA1C: No results for input(s): HGBA1C in the last 72 hours. CBG: Recent Labs  Lab 08/01/17 1634 08/01/17 2104 08/02/17 0213 08/02/17 0604 08/02/17 1059  GLUCAP 109* 102* 146* 123* 161*   Lipid Profile: No results for input(s): CHOL, HDL, LDLCALC, TRIG, CHOLHDL, LDLDIRECT in the last 72 hours. Thyroid Function Tests: No results for input(s): TSH, T4TOTAL, FREET4, T3FREE, THYROIDAB in the last 72  hours. Anemia Panel: No results for input(s): VITAMINB12, FOLATE, FERRITIN, TIBC, IRON, RETICCTPCT in the last 72 hours.    Radiology Studies: I have reviewed all of the imaging during this hospital visit personally     Scheduled Meds: . amiodarone  200 mg Oral BID  . amLODipine  2.5 mg Oral Daily  . apixaban  5 mg Oral BID  . clopidogrel  75 mg Oral Daily  . ezetimibe  10 mg Oral Daily  . ferrous sulfate  325 mg Oral Q breakfast  .  hydrALAZINE  50 mg Oral Q8H  . Influenza vac split quadrivalent PF  0.5 mL Intramuscular Tomorrow-1000  . insulin aspart  0-5 Units Subcutaneous QHS  . insulin aspart  0-9 Units Subcutaneous TID WC  . insulin aspart  8 Units Subcutaneous TID WC  . insulin glargine  40 Units Subcutaneous BID  . isosorbide mononitrate  30 mg Oral Daily  . magnesium oxide  400 mg Oral Daily  . metoprolol succinate  12.5 mg Oral BID  . omega-3 acid ethyl esters  2 g Oral Q breakfast  . omega-3 acid ethyl esters  3 g Oral BID AC  . polyethylene glycol  17 g Oral BID  . pravastatin  20 mg Oral q1800  . sertraline  100 mg Oral Daily  . sodium chloride  3 mL Nebulization BID  . tiotropium  18 mcg Inhalation QHS   Continuous Infusions:   LOS: 19 days        Alma Friendly, MD [facility]

## 2017-08-02 NOTE — Progress Notes (Signed)
Progress Note  Patient Name: Jose [name] Date of Encounter: 08/02/2017  Primary Cardiologist: Harl Bowie  Subjective   Doing OK this morning. No chest pain. Wife still concerned about degree of clumsiness with his hands. Reports appetite good.   Inpatient Medications    Scheduled Meds: . amiodarone  200 mg Oral BID  . amLODipine  2.5 mg Oral Daily  . apixaban  5 mg Oral BID  . clopidogrel  75 mg Oral Daily  . ezetimibe  10 mg Oral Daily  . ferrous sulfate  325 mg Oral Q breakfast  . hydrALAZINE  50 mg Oral Q8H  . Influenza vac split quadrivalent PF  0.5 mL Intramuscular Tomorrow-1000  . insulin aspart  0-5 Units Subcutaneous QHS  . insulin aspart  0-9 Units Subcutaneous TID WC  . insulin aspart  8 Units Subcutaneous TID WC  . insulin glargine  40 Units Subcutaneous BID  . isosorbide mononitrate  30 mg Oral Daily  . magnesium oxide  400 mg Oral Daily  . metoprolol succinate  12.5 mg Oral BID  . omega-3 acid ethyl esters  2 g Oral Q breakfast  . omega-3 acid ethyl esters  3 g Oral BID AC  . polyethylene glycol  17 g Oral BID  . pravastatin  20 mg Oral q1800  . sertraline  100 mg Oral Daily  . sodium chloride  3 mL Nebulization BID  . tiotropium  18 mcg Inhalation QHS   Continuous Infusions:  PRN Meds: acetaminophen, albuterol, lidocaine, lidocaine, lidocaine-EPINEPHrine, morphine injection, nitroGLYCERIN, ondansetron (ZOFRAN) IV, oxyCODONE-acetaminophen, oxymetazoline, silver nitrate applicators, TRIPLE ANTIBIOTIC   Vital Signs    Vitals:   08/01/17 2003 08/02/17 0000 08/02/17 0606 08/02/17 0804  BP: (!) 154/71  134/60 (!) 150/59  Pulse: 61 (!) 58 (!) 59 61  Resp: 14  (!) 9 17  Temp: 97.9 F (36.6 C)     TempSrc: Oral     SpO2: 95% 94% 99%   Weight:   292 lb 5.3 oz (132.6 kg)   Height:        Intake/Output Summary (Last 24 hours) at 08/02/2017 0833 Last data filed at 08/01/2017 2326 Gross per 24 hour  Intake 240 ml  Output 1100 ml  Net -860 ml   Filed  Weights   07/31/17 0420 08/01/17 0400 08/02/17 0606  Weight: 287 lb 11.2 oz (130.5 kg) 287 lb 0.6 oz (130.2 kg) 292 lb 5.3 oz (132.6 kg)    Telemetry    SR - Personally Reviewed  Physical Exam   GENERAL:  Obese WM in NAD HEENT:  PERRL, EOMI, sclera are clear. Oropharynx is clear. NECK:  No jugular venous distention, carotid upstroke brisk and symmetric, no bruits, no thyromegaly or adenopathy LUNGS:  Clear to auscultation bilaterally CHEST:  Unremarkable HEART:  RRR,  PMI not displaced or sustained,S1 and S2 within normal limits, no S3, no S4: no clicks, no rubs, no murmurs ABD:  Soft, nontender. BS +, no masses or bruits. No hepatomegaly, no splenomegaly EXT:  2 + pulses throughout, 1+  edema, no cyanosis no clubbing SKIN:  Warm and dry.  No rashes NEURO:  Alert and oriented x 3. Cranial nerves II through XII intact. PSYCH:  Cognitively intact    Labs    Chemistry Recent Labs  Lab 07/31/17 0206 08/01/17 0633 08/02/17 0223  NA 125* 127* 120*  K 3.9 4.1 4.9  CL 84* 86* 84*  CO2 30 31 20*  GLUCOSE 179* 132* 113*  BUN 56*  66* 71*  CREATININE 2.14* 2.34* 2.36*  CALCIUM 8.3* 8.5* 8.1*  GFRNONAA 30* 27* 27*  GFRAA 35* 32* 31*  ANIONGAP 11 10 16*     Hematology Recent Labs  Lab 07/27/17 0405 07/28/17 0247  WBC 8.9 9.8  RBC 3.68* 3.95*  HGB 10.2* 10.8*  HCT 31.7* 33.8*  MCV 86.1 85.6  MCH 27.7 27.3  MCHC 32.2 32.0  RDW 14.5 14.5  PLT 227 251    Cardiac EnzymesNo results for input(s): TROPONINI in the last 168 hours. No results for input(s): TROPIPOC in the last 168 hours.   BNPNo results for input(s): BNP, PROBNP in the last 168 hours.   DDimer No results for input(s): DDIMER in the last 168 hours.    Radiology    No results found.  Cardiac Studies   Cardiac cath July 14, 2017 -unsuccessful attempted PTCA of occluded distal LAD, chronic occlusion of RCA Diagnostic Diagram       Post-Intervention Diagram        Echo July 14, 2017 - Left ventricle: LVEF is approximately 40% with akinesis of the distal inferoseptal wall and apex. Aneurysmal dilatation of apex. Consider limited echo with contrast to evaluate for thrombus. The cavity size was normal. Wall thickness was increased in a pattern of mild LVH. - Mitral valve: Calcified annulus. Mildly thickened leaflets   Patient Profile     66 y.o. male presenting with extensive anteroapical and inferior infarction, unsuccessful attempt at revascularization of occluded distal LAD, chronic occlusion of RCA.  Moderate reduction inventricular systolic function. Atrial fibrillation with rapid ventricular response present on arrival, resolved with amiodarone. Moderate acute renal failure related to contrast nephrotoxicity/retention   Assessment & Plan    1. CAD s/p STEMI:Unsuccessful PCI to distal LAD occlusion and not optimal surgical candidate at this time; echo consistent with aneurysmal dilation of apex. No recurrent chest pain.  --plavix 75mg  daily, atorvastatin 40mg  daily, lopresor 12.5mg  BID --Eliquis for Afib. Would hold ASA given higher bleeding risk.  2. Acute on chronic systolic CHF: --Ins/outs not fully recorded; weights inconsistent; still having significant LE and sacral edema. --ACEi and lasix have been on hold due to renal dysfunction. --metoprolol 12.5mg  BID  3. AFib: Maintaining sinus rhythm. Plan current dose of amiodarone 4 weeks (200mg  BID), then decrease to maintenance dose of 200 mg daily CHADSVasc 5 (age, hypertension, diabetes, heart failure, CAD). --amiodarone 200mg  BID --Eliquis   4. Episodes of weakness/  acute infarction in left parietal periventricular white matter on MRI: -- Does not have clear seizure like activity or stroke like symptoms. He does have known L ICA stenosis   -- MRI 07/28/17 revealed subcentimeter acute infarction in left parietal periventricular white matter.     -- MRA 07/29/17  Showed Interval  occlusion of the left internal carotid artery with absence of antegrade flow at the skullbase. Approximately 70% stenosis of the right vertebral artery at the foramen magnum level.    -- Appreciate neurology recommendations, allowing for permissive hypertension with systolic 591-638.  5. HTN - stable  6. Acute renal failure on CKD stage III/ hyponatramia - Creatnine has progressively increased in last 2 weeks from 1.44>>1.77>>2.14 and now 2.36. Trend is worrisome. On no medication that would affect renal function. Also more hyponatremic. Will need Nephrology input again today.  7. OSA: --OnCPAPqhs.  8. DM: --on SSI   Dispo: Overall deconditioned, working with PT. Will need outpatient rehab. Patient does have a bed in Rehab facility but now renal issues need  to be addressed prior to discharge.   Signed, Peter Martinique, MD, [facility] 08/02/2017, 8:33 AM    For questions or updates, please contact [facility] Please consult [email or web address] for contact info under Cardiology/STEMI.

## 2017-08-02 NOTE — Progress Notes (Signed)
Subjective:  Call back to see pt- renal signed off on 11/19 when pt seemed to be diuresing well with lasix and crt was decreasing- he had AKI in the setting of an NSTEMI/contrast as well as nephrotic range proteinuria.  It subsequently reached a nadir of 1.44 then on 11/30 started to increase some now 2.3.  In addition has had persistent hyponatremia - no lasix since 11/28- I's and O's not well recorded- lowest weight was 127.5, is now 132.6- no obvious low low BPS but some in the low 100's- now mostly 120's-130's-  on toprol 12.5 BID, hydral 50 q 8 and norvasc 2.5- last EF was 40%- c/o dizziness     Objective Vital signs in last 24 hours: Vitals:   08/02/17 0804 08/02/17 0902 08/02/17 0920 08/02/17 1118  BP: (!) 150/59  (!) 125/57   Pulse: 61  60 (!) 59  Resp: 17     Temp:      TempSrc:      SpO2:  100%  99%  Weight:      Height:       Weight change: 2.4 kg (5 lb 4.7 oz)  Intake/Output Summary (Last 24 hours) at 08/02/2017 1320 Last data filed at 08/01/2017 2326 Gross per 24 hour  Intake -  Output 800 ml  Net -800 ml    Assessment/ Plan: Pt is a 66 y.o. yo male with DM who was admitted on 07/14/2017 with STEMI and AKI with volume overload- got better then got worse  Assessment/Plan: 1. HTN/vol- Pt remains volume overloaded and maybe has had BP too low for him given dizziness and inc in crt.  I am going to stop norvasc, dec hydralazine but keep toprol and add back lasix at 40 IV q 12   2. Renal- baseline crt 1.2- had AKI in the setting of STEMI- peaked at 2.9- improved to 1.44 but then started to rise again- will check U/A to rule out other causes but suspect hemodynamic due to relative hypotension or orthostatic hypotension 3. Hyponatremia- could be as simple as volume overload.   Since off lasix can check urinary osm to look for SIADH or other cause.  TSH was within normal limits, will check cortisol in AM  Ralf Konopka A    Labs: Basic Metabolic Panel: Recent Labs  Lab  07/31/17 0206 08/01/17 0633 08/02/17 0223  NA 125* 127* 120*  K 3.9 4.1 4.9  CL 84* 86* 84*  CO2 30 31 20*  GLUCOSE 179* 132* 113*  BUN 56* 66* 71*  CREATININE 2.14* 2.34* 2.36*  CALCIUM 8.3* 8.5* 8.1*   Liver Function Tests: No results for input(s): AST, ALT, ALKPHOS, BILITOT, PROT, ALBUMIN in the last 168 hours. No results for input(s): LIPASE, AMYLASE in the last 168 hours. No results for input(s): AMMONIA in the last 168 hours. CBC: Recent Labs  Lab 07/27/17 0405 07/28/17 0247  WBC 8.9 9.8  HGB 10.2* 10.8*  HCT 31.7* 33.8*  MCV 86.1 85.6  PLT 227 251   Cardiac Enzymes: No results for input(s): CKTOTAL, CKMB, CKMBINDEX, TROPONINI in the last 168 hours. CBG: Recent Labs  Lab 08/01/17 1634 08/01/17 2104 08/02/17 0213 08/02/17 0604 08/02/17 1059  GLUCAP 109* 102* 146* 123* 161*    Iron Studies: No results for input(s): IRON, TIBC, TRANSFERRIN, FERRITIN in the last 72 hours. Studies/Results: No results found. Medications: Infusions:   Scheduled Medications: . amiodarone  200 mg Oral BID  . amLODipine  2.5 mg Oral Daily  . apixaban  5  mg Oral BID  . clopidogrel  75 mg Oral Daily  . ezetimibe  10 mg Oral Daily  . ferrous sulfate  325 mg Oral Q breakfast  . hydrALAZINE  50 mg Oral Q8H  . Influenza vac split quadrivalent PF  0.5 mL Intramuscular Tomorrow-1000  . insulin aspart  0-5 Units Subcutaneous QHS  . insulin aspart  0-9 Units Subcutaneous TID WC  . insulin aspart  8 Units Subcutaneous TID WC  . insulin glargine  40 Units Subcutaneous BID  . isosorbide mononitrate  30 mg Oral Daily  . magnesium oxide  400 mg Oral Daily  . metoprolol succinate  12.5 mg Oral BID  . omega-3 acid ethyl esters  2 g Oral Q breakfast  . omega-3 acid ethyl esters  3 g Oral BID AC  . polyethylene glycol  17 g Oral BID  . pravastatin  20 mg Oral q1800  . sertraline  100 mg Oral Daily  . sodium chloride  3 mL Nebulization BID  . tiotropium  18 mcg Inhalation QHS    have  reviewed scheduled and prn medications.  Physical Exam: General: alert, nad- chronically ill appearing Heart: brady Lungs: dec BS at bases Abdomen: abd wall edema- obese Extremities: pitting edema to LE's     08/02/2017,1:20 PM  LOS: 19 days

## 2017-08-03 LAB — RENAL FUNCTION PANEL
ALBUMIN: 2.4 g/dL — AB (ref 3.5–5.0)
ANION GAP: 14 (ref 5–15)
BUN: 66 mg/dL — ABNORMAL HIGH (ref 6–20)
CALCIUM: 8.2 mg/dL — AB (ref 8.9–10.3)
CO2: 30 mmol/L (ref 22–32)
CREATININE: 2.17 mg/dL — AB (ref 0.61–1.24)
Chloride: 79 mmol/L — ABNORMAL LOW (ref 101–111)
GFR, EST AFRICAN AMERICAN: 35 mL/min — AB (ref >60)
GFR, EST NON AFRICAN AMERICAN: 30 mL/min — AB (ref >60)
Glucose, Bld: 106 mg/dL — ABNORMAL HIGH (ref 65–99)
PHOSPHORUS: 4.7 mg/dL — AB (ref 2.5–4.6)
Potassium: 4.1 mmol/L (ref 3.5–5.1)
SODIUM: 123 mmol/L — AB (ref 135–145)

## 2017-08-03 LAB — GLUCOSE, CAPILLARY
GLUCOSE-CAPILLARY: 116 mg/dL — AB (ref 65–99)
GLUCOSE-CAPILLARY: 160 mg/dL — AB (ref 65–99)
Glucose-Capillary: 165 mg/dL — ABNORMAL HIGH (ref 65–99)
Glucose-Capillary: 151 mg/dL — ABNORMAL HIGH (ref 65–99)

## 2017-08-03 LAB — CORTISOL: CORTISOL PLASMA: 9.8 ug/dL

## 2017-08-03 MED ORDER — METOPROLOL SUCCINATE ER 25 MG PO TB24: 12.5000 mg | ORAL_TABLET | Freq: Every day | ORAL | Status: DC

## 2017-08-03 NOTE — Progress Notes (Signed)
Physical Therapy Treatment Patient Details Name: Jose [name] MRN: [id number] DOB: 05/18/1951 Today's Date: 08/03/2017    History of Present Illness Pt is a 66 y.o. male who presented to [facility] on 07/14/17 with chest pain and dyspnea. On arrival to [facility] ED, his EKG showed new onset A-fib with ST elevation in the inferolateral leads; CODE STEMI was called. Cardiac cath 11/15 with unsuccessful attempt at revascularization of occluded distal LAD, chronic occlusion of RCA. MRI on 07/28/17 revealed Subcentimeter acute infarction in left parietal periventricular white matter Pertinent PMH includes CAD (CTO of RCA, with residual disease in LAD/Lcx), COPD, HL, HTN, obesity,DMand tobacco use.     PT Comments    Pt in bed upon arrival. BP 132/57 supine. Pt seemed in a good mood. Able to get up from bed with min guard for safety due to impulsiveness. Pt stated he was not feeling dizzy when seated EOB. Mod assist +1 for power up and balance standing from EOB. VCs for hand placement on bed to push up; pt immediately forgot and reached for RW with both UEs. Pt took 3 or 4 steps and stated he could not continue, felt that he was going to go down. Helped pt pivot to chair with min assist +2 for safety and control of decent into chair. Once in chair, pt slumped heavily to R side with eyes and mouth wide open. When spoke his name he roused and did not know what had happened. Continued to slump to R and required assist to maintain trunk stability. BP 143/112 seated. RN notified and came to room to examine pt. Pt transferred back to bed. Mod assist sit to supine; management of trunk and LEs. BP 102/87 supine in bed after session.  Follow Up Recommendations  Supervision for mobility/OOB;SNF     Equipment Recommendations  None recommended by PT    Recommendations for Other Services OT consult     Precautions / Restrictions Precautions Precautions: Fall Precaution Comments: intermittent weakness with  dizziness Restrictions Weight Bearing Restrictions: No    Mobility  Bed Mobility Overal bed mobility: Needs Assistance Bed Mobility: Supine to Sit;Sit to Supine Rolling: Min guard   Supine to sit: Min guard;HOB elevated Sit to supine: +2 for physical assistance;Mod assist   General bed mobility comments: Min guard assist for safety. Mod assist for sit to supine due to cognitive and processing issues. Suspect pt has physical strength to perform. Verbal and tactile cues for moving shoulders and LEs over in bed. Pt seemed confused by cues.  Transfers Overall transfer level: Needs assistance Equipment used: Rolling walker (2 wheeled) Transfers: Sit to/from [facility] Sit to Stand: Mod assist Stand pivot transfers: Min assist;+2 physical assistance;+2 safety/equipment       General transfer comment: First sit to stand from EOB mod assist +2 for power up, balance, and safety. Verbal and tactile cues for safe hand placement; immediantly forgets and puts both hands on walker. Min assist +2 to sit in chair due to dizziness and weakness. Standing pivot back to bed with min assist +2 for balance and safety.   Ambulation/Gait Ambulation/Gait assistance: Mod assist;Min assist;+2 physical assistance Ambulation Distance (Feet): 5 Feet Assistive device: Rolling walker (2 wheeled) Gait Pattern/deviations: Step-to pattern;Wide base of support;Shuffle Gait velocity: Decreased   General Gait Details: Wide BOS and flexed posture. Pt took 3 or 4 steps before stating "he was going down." Assist +2 to sit in chair.    Stairs  Wheelchair Mobility    Modified Rankin (Stroke Patients Only)       Balance Overall balance assessment: Needs assistance Sitting-balance support: Feet supported;Bilateral upper extremity supported Sitting balance-Leahy Scale: Fair   Postural control: Right lateral lean Standing balance support: Bilateral upper extremity  supported Standing balance-Leahy Scale: Poor Standing balance comment: Relied on RW support this session.                             Cognition Arousal/Alertness: Awake/alert   Overall Cognitive Status: Difficult to assess Area of Impairment: Attention;Following commands;Awareness;Problem solving                       Following Commands: Follows one step commands with increased time;Follows one step commands inconsistently     Problem Solving: Slow processing;Requires verbal cues General Comments: Pt with intermittent ability to follow commands. Unable to maintain attention to tasks this sesison. Will begin task and quickly forget what he is doing. Pt jokes so unsure when experiencing deficits or joking.      Exercises      General Comments        Pertinent Vitals/Pain Pain Assessment: No/denies pain Pain Intervention(s): Monitored during session;Repositioned    Home Living                      Prior Function            PT Goals (current goals can now be found in the care plan section) Acute Rehab PT Goals Patient Stated Goal: none discussed PT Goal Formulation: With patient Time For Goal Achievement: 08/03/17 Potential to Achieve Goals: Good Progress towards PT goals: Progressing toward goals    Frequency    Min 3X/week      PT Plan Current plan remains appropriate    Co-evaluation              AM-PAC PT "6 Clicks" Daily Activity  Outcome Measure  Difficulty turning over in bed (including adjusting bedclothes, sheets and blankets)?: None Difficulty moving from lying on back to sitting on the side of the bed? : Unable Difficulty sitting down on and standing up from a chair with arms (e.g., wheelchair, bedside commode, etc,.)?: Unable Help needed moving to and from a bed to chair (including a wheelchair)?: A Lot Help needed walking in hospital room?: A Lot Help needed climbing 3-5 steps with a railing? : Total 6 Click  Score: 11    End of Session Equipment Utilized During Treatment: Gait belt Activity Tolerance: Patient limited by fatigue Patient left: with call bell/phone within reach;with family/visitor present;in bed;with bed alarm set;with nursing/sitter in room Nurse Communication: Mobility status;Other (comment)(pt's response to ambulation) PT Visit Diagnosis: Other abnormalities of gait and mobility (R26.89)     Time: 1610-9604 PT Time Calculation (min) (ACUTE ONLY): 30 min  Charges:  $Gait Training: 8-22 mins $Therapeutic Activity: 8-22 mins                    G Codes:       Janna Arch, SPTA   Janna Arch 08/03/2017, 2:53 PM

## 2017-08-03 NOTE — Plan of Care (Signed)
  Progressing Health Behavior/Discharge Planning: Ability to manage health-related needs will improve 08/03/2017 2334 - Progressing by Blair Promise, RN Activity: Risk for activity intolerance will decrease 08/03/2017 2334 - Progressing by Blair Promise, RN Pain Managment: General experience of comfort will improve 08/03/2017 2334 - Progressing by Blair Promise, RN Skin Integrity: Risk for impaired skin integrity will decrease 08/03/2017 2334 - Progressing by Blair Promise, RN Activity: Ability to tolerate increased activity will improve 08/03/2017 2334 - Progressing by Blair Promise, RN Health Behavior/Discharge Planning: Ability to safely manage health-related needs after discharge will improve 08/03/2017 2334 - Progressing by Blair Promise, RN

## 2017-08-03 NOTE — Progress Notes (Signed)
PT called RN into room because patient slumped over and seemed to pass out while transferring from bed to chair. Pt VSS. A/Ox3 (said it was 2009). Pt was A/Ox4 this AM. Pupils 65mm and unresponsive to penlight. Grips are equal in strength. Reino Bellis paged. Received orders to continue to monitor. Pt transferred back to bed with some difficulty following commands.   Clyde Canterbury, RN

## 2017-08-03 NOTE — Progress Notes (Signed)
Subjective:  BP overall higher- is good- 2000 UOP- sodium up, crt down but weight  - he seems less alert- not really responding well to questions (spacing off)  Objective Vital signs in last 24 hours: Vitals:   08/02/17 2232 08/03/17 0435 08/03/17 0625 08/03/17 0820  BP: 132/75 (!) 141/61 125/67 (!) 123/112  Pulse:  (!) 58 (!) 59 61  Resp: 17 (!) 9 14   Temp:  (!) 97.1 F (36.2 C)    TempSrc:  Axillary    SpO2: 94% 93%    Weight:  134.1 kg (295 lb 10.2 oz)    Height:       Weight change: 1.5 kg (3 lb 4.9 oz)  Intake/Output Summary (Last 24 hours) at 08/03/2017 7517 Last data filed at 08/03/2017 0017 Gross per 24 hour  Intake 240 ml  Output 2075 ml  Net -1835 ml   Assessment/ Plan: Pt is a 66 y.o. yo male with DM who was admitted on 07/14/2017 with STEMI and AKI with volume overload- got better then got worse  Assessment/Plan: 1. HTN/vol- Pt remains volume overloaded and maybe has had BP too low for him given dizziness and inc in crt.  I stopped norvasc, and dec hydralazine- given brady- will dec toprol as well - cont lasix at 40 IV q 12   2. Renal- baseline crt 1.2- had AKI in the setting of STEMI- peaked at 2.9- improved to 1.44 but then started to rise again-  U/A not remarkable - suspect hemodynamic due to relative hypotension or orthostatic hypotension 3. Hyponatremia- could be as simple as volume overload.   urinary osm indicates that he is appropriately trying to dilute urine so not SIADH, cortisol OK.  Sodium better this AM 4. Dec MS- unclear how much narcotics/antiemetics he is getting, I stopped the IV form- dont know if due to sodium or low HR/BP given wife says he has carotid stenosis- dec toprol     Latorie Montesano A    Labs: Basic Metabolic Panel: Recent Labs  Lab 08/01/17 0633 08/02/17 0223 08/03/17 0316  NA 127* 120* 123*  K 4.1 4.9 4.1  CL 86* 84* 79*  CO2 31 20* 30  GLUCOSE 132* 113* 106*  BUN 66* 71* 66*  CREATININE 2.34* 2.36* 2.17*  CALCIUM  8.5* 8.1* 8.2*  PHOS  --   --  4.7*   Liver Function Tests: Recent Labs  Lab 08/03/17 0316  ALBUMIN 2.4*   No results for input(s): LIPASE, AMYLASE in the last 168 hours. No results for input(s): AMMONIA in the last 168 hours. CBC: Recent Labs  Lab 07/28/17 0247  WBC 9.8  HGB 10.8*  HCT 33.8*  MCV 85.6  PLT 251   Cardiac Enzymes: No results for input(s): CKTOTAL, CKMB, CKMBINDEX, TROPONINI in the last 168 hours. CBG: Recent Labs  Lab 08/02/17 0604 08/02/17 1059 08/02/17 1639 08/02/17 2154 08/03/17 0608  GLUCAP 123* 161* 127* 81 116*    Iron Studies: No results for input(s): IRON, TIBC, TRANSFERRIN, FERRITIN in the last 72 hours. Studies/Results: No results found. Medications: Infusions:   Scheduled Medications: . amiodarone  200 mg Oral BID  . apixaban  5 mg Oral BID  . clopidogrel  75 mg Oral Daily  . ezetimibe  10 mg Oral Daily  . ferrous sulfate  325 mg Oral Q breakfast  . furosemide  40 mg Intravenous BID  . hydrALAZINE  25 mg Oral Q8H  . Influenza vac split quadrivalent PF  0.5 mL Intramuscular Tomorrow-1000  .  insulin aspart  0-5 Units Subcutaneous QHS  . insulin aspart  0-9 Units Subcutaneous TID WC  . insulin aspart  8 Units Subcutaneous TID WC  . insulin glargine  40 Units Subcutaneous BID  . isosorbide mononitrate  30 mg Oral Daily  . magnesium oxide  400 mg Oral Daily  . metoprolol succinate  12.5 mg Oral BID  . omega-3 acid ethyl esters  2 g Oral Q breakfast  . omega-3 acid ethyl esters  3 g Oral BID AC  . polyethylene glycol  17 g Oral BID  . pravastatin  20 mg Oral q1800  . sertraline  100 mg Oral Daily  . sodium chloride  3 mL Nebulization BID  . tiotropium  18 mcg Inhalation QHS    have reviewed scheduled and prn medications.  Physical Exam: General: obese, chronically ill- fading off during conversation this AM Heart: brady Lungs: dec BS at bases, poor effort Abdomen: obese, soft , nontender Extremities: pitting  edema    08/03/2017,9:54 AM  LOS: 20 days

## 2017-08-03 NOTE — Progress Notes (Signed)
Progress Note  Patient Name: Jose [name] Date of Encounter: 08/03/2017  Primary Cardiologist:   Subjective   No complaints this morning. No complaints, states he feels "fair".   Inpatient Medications    Scheduled Meds: . amiodarone  200 mg Oral BID  . apixaban  5 mg Oral BID  . clopidogrel  75 mg Oral Daily  . ezetimibe  10 mg Oral Daily  . ferrous sulfate  325 mg Oral Q breakfast  . furosemide  40 mg Intravenous BID  . hydrALAZINE  25 mg Oral Q8H  . Influenza vac split quadrivalent PF  0.5 mL Intramuscular Tomorrow-1000  . insulin aspart  0-5 Units Subcutaneous QHS  . insulin aspart  0-9 Units Subcutaneous TID WC  . insulin aspart  8 Units Subcutaneous TID WC  . insulin glargine  40 Units Subcutaneous BID  . isosorbide mononitrate  30 mg Oral Daily  . magnesium oxide  400 mg Oral Daily  . [START ON 08/04/2017] metoprolol succinate  12.5 mg Oral Daily  . omega-3 acid ethyl esters  2 g Oral Q breakfast  . omega-3 acid ethyl esters  3 g Oral BID AC  . polyethylene glycol  17 g Oral BID  . pravastatin  20 mg Oral q1800  . sertraline  100 mg Oral Daily  . sodium chloride  3 mL Nebulization BID  . tiotropium  18 mcg Inhalation QHS   Continuous Infusions:  PRN Meds: acetaminophen, albuterol, lidocaine, lidocaine, lidocaine-EPINEPHrine, nitroGLYCERIN, ondansetron (ZOFRAN) IV, oxyCODONE-acetaminophen, oxymetazoline, silver nitrate applicators, TRIPLE ANTIBIOTIC   Vital Signs    Vitals:   08/02/17 2232 08/03/17 0435 08/03/17 0625 08/03/17 0820  BP: 132/75 (!) 141/61 125/67 (!) 123/112  Pulse:  (!) 58 (!) 59 61  Resp: 17 (!) 9 14   Temp:  (!) 97.1 F (36.2 C)    TempSrc:  Axillary    SpO2: 94% 93%    Weight:  295 lb 10.2 oz (134.1 kg)    Height:        Intake/Output Summary (Last 24 hours) at 08/03/2017 1026 Last data filed at 08/03/2017 0615 Gross per 24 hour  Intake 240 ml  Output 2075 ml  Net -1835 ml   Filed Weights   08/01/17 0400 08/02/17 0606 08/03/17  0435  Weight: 287 lb 0.6 oz (130.2 kg) 292 lb 5.3 oz (132.6 kg) 295 lb 10.2 oz (134.1 kg)    Telemetry    SR - Personally Reviewed  Physical Exam   General: Chronically ill male appearing in no acute distress. Head: Normocephalic, atraumatic.  Neck: Supple without bruits, JVD. Lungs:  Resp regular and unlabored, CTA. Heart: RRR, S1, S2, no S3, S4, or murmur; no rub. Abdomen: Soft, non-tender, non-distended with normoactive bowel sounds.  Extremities: No clubbing, cyanosis, 1+ pitting LE edema. Distal pedal pulses are 2+ bilaterally. Neuro: Alert and oriented X 3. Moves all extremities spontaneously. Psych: Normal affect.  Labs    Chemistry Recent Labs  Lab 08/01/17 [phone] 08/02/17 0223 08/03/17 0316  NA 127* 120* 123*  K 4.1 4.9 4.1  CL 86* 84* 79*  CO2 31 20* 30  GLUCOSE 132* 113* 106*  BUN 66* 71* 66*  CREATININE 2.34* 2.36* 2.17*  CALCIUM 8.5* 8.1* 8.2*  ALBUMIN  --   --  2.4*  GFRNONAA 27* 27* 30*  GFRAA 32* 31* 35*  ANIONGAP 10 16* 14     Hematology Recent Labs  Lab 07/28/17 0247  WBC 9.8  RBC 3.95*  HGB 10.8*  HCT 33.8*  MCV 85.6  MCH 27.3  MCHC 32.0  RDW 14.5  PLT 251    Cardiac EnzymesNo results for input(s): TROPONINI in the last 168 hours. No results for input(s): TROPIPOC in the last 168 hours.   BNPNo results for input(s): BNP, PROBNP in the last 168 hours.   DDimer No results for input(s): DDIMER in the last 168 hours.    Radiology    No results found.  Cardiac Studies   Cardiac cath July 14, 2017 -unsuccessful attempted PTCA of occluded distal LAD, chronic occlusion of RCA Diagnostic Diagram       Post-Intervention Diagram        Echo July 14, 2017 - Left ventricle: LVEF is approximately 40% with akinesis of the distal inferoseptal wall and apex. Aneurysmal dilatation of apex. Consider limited echo with contrast to evaluate for thrombus. The cavity size was normal. Wall thickness was increased in a  pattern of mild LVH. - Mitral valve: Calcified annulus. Mildly thickened leaflets  Patient Profile     66 y.o. male presenting with extensive anteroapical and inferior infarction, unsuccessful attempt at revascularization of occluded distal LAD, chronic occlusion of RCA. Moderate reduction inventricular systolic function. Atrial fibrillation with rapid ventricular response present on arrival, resolved with amiodarone.Developed neurological symptoms, and rising Cr. Nephrology re-consulted.   Assessment & Plan    1.CAD s/p STEMI:Unsuccessful PCI to distal LAD occlusion and not optimal surgical candidate at this time; echo consistent with aneurysmal dilation of apex.No recurrent chest pain. --plavix 75mg  daily, atorvastatin 40mg  daily, lopresor 12.5mg  BID (held today) --Eliquis for Afib. Would hold ASA given higher bleeding risk.  2.Acute on chronic systolic CHF: --Ins/outs not fully recorded; weights inconsistent; still having significant LE and sacral edema. --ACEi has been held --lasix restarted yesterday after nephrology consult. UOP of 2L   3.AFib: Maintaining sinus rhythm. Plan current dose of amiodarone 4 weeks (200mg  BID), then decrease to maintenance dose of 200 mg daily CHADSVasc 5 (age, hypertension, diabetes, heart failure, CAD). --amiodarone 200mg  BID --Eliquis   4.Episodes of weakness/acute infarction in left parietal periventricular white matter on MRI: --Does not have clear seizure like activity or stroke like symptoms. He does have known L ICA stenosis --MRI 07/28/17 revealed subcentimeter acute infarction in left parietal periventricular white matter. -- MRA 07/29/17 Showed Interval occlusion of the left internal carotid artery with absence of antegrade flow at the skullbase. Approximately 70% stenosis of the right vertebral artery at the foramen magnum level. -- Appreciate neurology recommendations, allowing for permissive hypertension  with systolic [phone].  5.HTN - blood pressure medications have been reduced back to allow room for diuresis  6.Acute renal failure on CKD stage III/ hyponatramia -Creatnine has progressively increased in last 2 weeks from 1.44>>1.77>>2.14>>2.36 yesterday.  - appreciate nephrology input. Back on IV lasix, with Cr 2.14 this morning. Na+ trending up to 123.    7.OSA: --OnCPAPqhs.  8.DM: --on SSI  Dispo: Overall deconditioned, working with PT. Will need outpatient rehab. Patient does have a bed in Rehab facility but now renal issues need to be addressed prior to discharge.   Signed, Reino Bellis, NP  08/03/2017, 10:26 AM  Pager # [phone]   For questions or updates, please contact [place] Please consult [email or web address] for contact info under Cardiology/STEMI.    Patient seen and examined and history reviewed. Agree with above findings and plan. Patient had an episode while getting up to chair. Slumped over. Lethargic. Improved when back in bed. BP still labile. I agree  with Dr. Moshe Cipro with decreasing antihypertensives. Renal function and sodium a little better with diuresis. Wife very concerned about his continued clumsiness, shaking, and double vision. Hospitalist reports paging Neuro yesterday- may be helpful for them to address family's concerns.   Wilbur [place], [place] 08/03/2017 12:09 PM

## 2017-08-03 NOTE — Care Management Important Message (Signed)
Important Message  Patient Details  Name: Jose [name] MRN: [id number] Date of Birth: [birth year]   Medicare Important Message Given:  Yes    [name] 08/03/2017, 10:13 AM

## 2017-08-04 DIAGNOSIS — R29898 Other symptoms and signs involving the musculoskeletal system: Secondary | ICD-10-CM

## 2017-08-04 DIAGNOSIS — R5381 Other malaise: Secondary | ICD-10-CM

## 2017-08-04 LAB — RENAL FUNCTION PANEL
ALBUMIN: 2.5 g/dL — AB (ref 3.5–5.0)
ANION GAP: 12 (ref 5–15)
BUN: 62 mg/dL — AB (ref 6–20)
CO2: 32 mmol/L (ref 22–32)
Calcium: 8.4 mg/dL — ABNORMAL LOW (ref 8.9–10.3)
Chloride: 81 mmol/L — ABNORMAL LOW (ref 101–111)
Creatinine, Ser: 1.98 mg/dL — ABNORMAL HIGH (ref 0.61–1.24)
GFR calc Af Amer: 39 mL/min — ABNORMAL LOW (ref >60)
GFR, EST NON AFRICAN AMERICAN: 33 mL/min — AB (ref >60)
GLUCOSE: 97 mg/dL (ref 65–99)
PHOSPHORUS: 4.2 mg/dL (ref 2.5–4.6)
POTASSIUM: 4.2 mmol/L (ref 3.5–5.1)
SODIUM: 125 mmol/L — AB (ref 135–145)

## 2017-08-04 LAB — CBC
HCT: 28.5 % — ABNORMAL LOW (ref 39.0–52.0)
Hemoglobin: 9.4 g/dL — ABNORMAL LOW (ref 13.0–17.0)
MCH: 28 pg (ref 26.0–34.0)
MCHC: 33 g/dL (ref 30.0–36.0)
MCV: 84.8 fL (ref 78.0–100.0)
Platelets: 336 10*3/uL (ref 150–400)
RBC: 3.36 MIL/uL — ABNORMAL LOW (ref 4.22–5.81)
RDW: 14.6 % (ref 11.5–15.5)
WBC: 6.2 10*3/uL (ref 4.0–10.5)

## 2017-08-04 LAB — GLUCOSE, CAPILLARY
GLUCOSE-CAPILLARY: 175 mg/dL — AB (ref 65–99)
GLUCOSE-CAPILLARY: 153 mg/dL — AB (ref 65–99)
GLUCOSE-CAPILLARY: 179 mg/dL — AB (ref 65–99)
Glucose-Capillary: 120 mg/dL — ABNORMAL HIGH (ref 65–99)

## 2017-08-04 MED ORDER — ISOSORBIDE MONONITRATE ER 30 MG PO TB24
15.0000 mg | ORAL_TABLET | Freq: Every day | ORAL | Status: DC
  Administered 2017-08-04 – 2017-08-09 (×6): 15 mg via ORAL
  Filled 2017-08-04 (×6): qty 1

## 2017-08-04 MED ORDER — AMIODARONE HCL 200 MG PO TABS: 100.0000 mg | ORAL_TABLET | Freq: Every day | ORAL | Status: DC

## 2017-08-04 MED ORDER — METOPROLOL SUCCINATE ER 25 MG PO TB24
12.5000 mg | ORAL_TABLET | Freq: Every day | ORAL | Status: DC
  Administered 2017-08-04 – 2017-08-09 (×6): 12.5 mg via ORAL
  Filled 2017-08-04 (×6): qty 1

## 2017-08-04 NOTE — Progress Notes (Signed)
Subjective:  BP overall higher but none recorded sitting or standing- had another 2000 UOP- sodium up, crt down but weight not going down - maybe Jose [name] little more alert  Objective Vital signs in last 24 hours: Vitals:   08/03/17 1945 08/03/17 2202 08/04/17 0347 08/04/17 0823  BP: (!) 127/45 133/83 139/66 (!) 154/51  Pulse: (!) 56  (!) 56   Resp: 19  15   Temp: 98 F (36.7 C)  97.6 F (36.4 C)   TempSrc: Oral  Oral   SpO2: 96%  97%   Weight:   134 kg (295 lb 6.7 oz)   Height:       Weight change: -0.1 kg (-3.5 oz)  Intake/Output Summary (Last 24 hours) at 08/04/2017 0901 Last data filed at 08/04/2017 0033 Gross per 24 hour  Intake 240 ml  Output 2000 ml  Net -1760 ml   Assessment/ Plan: Jose Jose [name] is Jose [name] 66 y.o. yo male with DM who was admitted on 07/14/2017 with STEMI and AKI with volume overload- got better then got worse  Assessment/Plan: 1. HTN/vol- Jose Jose [name] remains volume overloaded and maybe has had BP too low for him given dizziness and inc in crt.  I stopped norvasc, and dec hydralazine- given brady  dec toprol as well yest- cards to stop today - cont lasix at 40 IV q 12 - hopefully will see change in weight or clinical impact  2. Renal- baseline crt 1.2- had AKI in the setting of STEMI- peaked at 2.9- improved to 1.44 but then started to rise again-  U/Jose [name] not remarkable - suspect hemodynamic due to relative hypotension or orthostatic hypotension- is improving 3. Hyponatremia- could be as simple as volume overload.   urinary osm indicates that he is appropriately trying to dilute urine so not SIADH, cortisol OK.  Sodium better again this AM 4. Dec MS- unclear how much narcotics/antiemetics he is getting, I stopped the IV form- dont know if due to sodium or low HR/BP given wife says he has carotid stenosis- - primary to get neuro back involved   Jose Jose [name]    Labs: Basic Metabolic Panel: Recent Labs  Lab 08/02/17 0223 08/03/17 0316 08/04/17 0253  NA 120* 123* 125*  K 4.9 4.1  4.2  CL 84* 79* 81*  CO2 20* 30 32  GLUCOSE 113* 106* 97  BUN 71* 66* 62*  CREATININE 2.36* 2.17* 1.98*  CALCIUM 8.1* 8.2* 8.4*  PHOS  --  4.7* 4.2   Liver Function Tests: Recent Labs  Lab 08/03/17 0316 08/04/17 0253  ALBUMIN 2.4* 2.5*   No results for input(s): LIPASE, AMYLASE in the last 168 hours. No results for input(s): AMMONIA in the last 168 hours. CBC: Recent Labs  Lab 08/04/17 0253  WBC 6.2  HGB 9.4*  HCT 28.5*  MCV 84.8  PLT 336   Cardiac Enzymes: No results for input(s): CKTOTAL, CKMB, CKMBINDEX, TROPONINI in the last 168 hours. CBG: Recent Labs  Lab 08/03/17 0608 08/03/17 1141 08/03/17 1650 08/03/17 2109 08/04/17 0556  GLUCAP 116* 165* 151* 160* 120*    Iron Studies: No results for input(s): IRON, TIBC, TRANSFERRIN, FERRITIN in the last 72 hours. Studies/Results: No results found. Medications: Infusions:   Scheduled Medications: . amiodarone  200 mg Oral BID  . apixaban  5 mg Oral BID  . clopidogrel  75 mg Oral Daily  . ezetimibe  10 mg Oral Daily  . ferrous sulfate  325 mg Oral Q breakfast  . furosemide  40 mg Intravenous BID  .  hydrALAZINE  25 mg Oral Q8H  . Influenza vac split quadrivalent PF  0.5 mL Intramuscular Tomorrow-1000  . insulin aspart  0-5 Units Subcutaneous QHS  . insulin aspart  0-9 Units Subcutaneous TID WC  . insulin aspart  8 Units Subcutaneous TID WC  . insulin glargine  40 Units Subcutaneous BID  . isosorbide mononitrate  30 mg Oral Daily  . magnesium oxide  400 mg Oral Daily  . metoprolol succinate  12.5 mg Oral Daily  . omega-3 acid ethyl esters  2 g Oral Q breakfast  . omega-3 acid ethyl esters  3 g Oral BID AC  . polyethylene glycol  17 g Oral BID  . pravastatin  20 mg Oral q1800  . sertraline  100 mg Oral Daily  . tiotropium  18 mcg Inhalation QHS    have reviewed scheduled and prn medications.  Physical Exam: General: obese, chronically ill- Jose [name] little more alert this AM  Heart: brady Lungs: dec BS at  bases, poor effort Abdomen: obese, soft , nontender Extremities: pitting edema- foley bag full     08/04/2017,9:01 AM  LOS: 21 days

## 2017-08-04 NOTE — Progress Notes (Signed)
CSW following patient and family for discharge needs. CSW made [facility] aware that at this time patient is not medically stable for discharge. Facility stated they will be able to save patients bed but stated if patient discharge during the weekend  the facility would like to know so they can prep for patients arrival.   Rhea Pink, MSW,  [place]

## 2017-08-04 NOTE — Progress Notes (Signed)
Neurology sign off note  Subjective:  Wife wanted neurology to come back and talk to both patient and herself about some issues that she was questioning.  1 of the major questions was she noted that when patient gets up from a seated position, or walks for short distance he notices that he gets dizzy, feels like he is going to pass out, and at times PT is noticed that he drinks a little bit to the right side.  She states that once daily and back on the bed he feels much better.  He states that times he might get a little bit of double vision when this happens, or blurry vision but this also clears.  Patient admits that on a daily basis his basic routine is to get up, had breakfast, and sit in the recliner watching movies all day.  Does the extent of the exercise he does.  He knows he is deconditioned.  Patient was also fluid overloaded and had renal work with him while he was in the hospital and this seems to be improving.   Vitals:   08/04/17 0347 08/04/17 0823  BP: 139/66 (!) 154/51  Pulse: (!) 56   Resp: 15   Temp: 97.6 F (36.4 C)   SpO2: 97%    Gen: In bed, NAD Resp: non-labored breathing, no acute distress Abd: soft, nt   General: NAD Mental Status: Alert, oriented, thought content appropriate.  Speech fluent without evidence of aphasia.  Able to follow 3 step commands without difficulty. Cranial Nerves: II:  Visual fields grossly normal, pupils equal, round, reactive to light and accommodation III,IV, VI: ptosis not present, extra-ocular motions intact bilaterally V,VII: smile symmetric, facial light touch sensation normal bilaterally VIII: hearing normal bilaterally IX,X: uvula rises symmetrically XI: bilateral shoulder shrug XII: midline tongue extension without atrophy or fasciculations  Motor: Right : Upper extremity   5/5    Left:     Upper extremity   5/5  Lower extremity   5/5     Lower extremity   5/5 Tone and bulk:normal tone throughout; no atrophy  noted Sensory: Pinprick and light touch intact throughout, bilaterally Deep Tendon Reflexes:  Depressed throughout  Plantars: Mute bilaterally Cerebellar: normal finger-to-nose,   Gait:     Pertinent Labs: Sodium 125 BUN 62 Creatinine 1.98 Calcium 8.4  Brief summary of case/Impression:  This is a 66 year old male with multiple cerebrovascular risk factors currently admitted for an ST elevation MI.  Patient is also fluid overloaded and had some confusion and a lot of weakness.  At baseline patient is cognitively clear however is very deconditioned.  Patient has no localizing or lateralizing symptoms that would indicate a neurological issue.  He did have an MRI done which showed a punctate infarct which had nothing to do with patient's condition.  At this time patient is now having some issues with orthostatic blood pressures and symptoms when he stands up and walks.  I have explained to the wife and patient that the big issue here is that he is deconditioned fluid overloaded and needs to be seen by PT and worked as an outpatient with physical therapy.  Neurological Diagnoses: 1) generalized weakness and deconditioning  Recommendations: 1) agree with outpatient physical therapy to recondition patient which will help with both cardiac, fluid overload, edema, orthostatic blood pressures. 2) Neurology to sign off at this time. Please call with any further questions or concerns.

## 2017-08-04 NOTE — Progress Notes (Signed)
Progress Note  Patient Name: Jose [name] Date of Encounter: 08/04/2017  Primary Cardiologist:   Subjective   No complaints this morning. Still weak. Wife reports an episode of extreme weakness when he got up to commode last night.   Inpatient Medications    Scheduled Meds: . amiodarone  100 mg Oral Daily  . apixaban  5 mg Oral BID  . clopidogrel  75 mg Oral Daily  . ezetimibe  10 mg Oral Daily  . ferrous sulfate  325 mg Oral Q breakfast  . furosemide  40 mg Intravenous BID  . hydrALAZINE  25 mg Oral Q8H  . Influenza vac split quadrivalent PF  0.5 mL Intramuscular Tomorrow-1000  . insulin aspart  0-5 Units Subcutaneous QHS  . insulin aspart  0-9 Units Subcutaneous TID WC  . insulin aspart  8 Units Subcutaneous TID WC  . insulin glargine  40 Units Subcutaneous BID  . isosorbide mononitrate  15 mg Oral Daily  . magnesium oxide  400 mg Oral Daily  . omega-3 acid ethyl esters  2 g Oral Q breakfast  . omega-3 acid ethyl esters  3 g Oral BID AC  . polyethylene glycol  17 g Oral BID  . pravastatin  20 mg Oral q1800  . sertraline  100 mg Oral Daily  . tiotropium  18 mcg Inhalation QHS   Continuous Infusions:  PRN Meds: acetaminophen, albuterol, lidocaine, lidocaine, lidocaine-EPINEPHrine, nitroGLYCERIN, ondansetron (ZOFRAN) IV, oxyCODONE-acetaminophen, oxymetazoline, silver nitrate applicators, TRIPLE ANTIBIOTIC   Vital Signs    Vitals:   08/03/17 1945 08/03/17 2202 08/04/17 0347 08/04/17 0823  BP: (!) 127/45 133/83 139/66 (!) 154/51  Pulse: (!) 56  (!) 56   Resp: 19  15   Temp: 98 F (36.7 C)  97.6 F (36.4 C)   TempSrc: Oral  Oral   SpO2: 96%  97%   Weight:   295 lb 6.7 oz (134 kg)   Height:        Intake/Output Summary (Last 24 hours) at 08/04/2017 0905 Last data filed at 08/04/2017 0033 Gross per 24 hour  Intake 240 ml  Output 2000 ml  Net -1760 ml   Filed Weights   08/02/17 0606 08/03/17 0435 08/04/17 0347  Weight: 292 lb 5.3 oz (132.6 kg) 295 lb 10.2  oz (134.1 kg) 295 lb 6.7 oz (134 kg)    Telemetry    SR- sinus brady - Personally Reviewed  Physical Exam   GENERAL:  Chronically ill appearing WM  HEENT:  PERRL, EOMI, sclera are clear. Oropharynx is clear. NECK:  No jugular venous distention, carotid upstroke brisk and symmetric, no bruits, no thyromegaly or adenopathy LUNGS:  Clear to auscultation bilaterally CHEST:  Unremarkable HEART:  RRR,  PMI not displaced or sustained,S1 and S2 within normal limits, no S3, no S4: no clicks, no rubs, no murmurs ABD:  Soft, nontender. BS +, no masses or bruits. No hepatomegaly, no splenomegaly EXT:  2 + pulses throughout, 2+ edema, no cyanosis no clubbing SKIN:  Warm and dry.  No rashes NEURO:  Alert and oriented x 3. Cranial nerves II through XII intact. PSYCH:  Cognitively intact    Labs    Chemistry Recent Labs  Lab 08/02/17 0223 08/03/17 0316 08/04/17 0253  NA 120* 123* 125*  K 4.9 4.1 4.2  CL 84* 79* 81*  CO2 20* 30 32  GLUCOSE 113* 106* 97  BUN 71* 66* 62*  CREATININE 2.36* 2.17* 1.98*  CALCIUM 8.1* 8.2* 8.4*  ALBUMIN  --  2.4* 2.5*  GFRNONAA 27* 30* 33*  GFRAA 31* 35* 39*  ANIONGAP 16* 14 12     Hematology Recent Labs  Lab 08/04/17 0253  WBC 6.2  RBC 3.36*  HGB 9.4*  HCT 28.5*  MCV 84.8  MCH 28.0  MCHC 33.0  RDW 14.6  PLT 336    Cardiac EnzymesNo results for input(s): TROPONINI in the last 168 hours. No results for input(s): TROPIPOC in the last 168 hours.   BNPNo results for input(s): BNP, PROBNP in the last 168 hours.   DDimer No results for input(s): DDIMER in the last 168 hours.    Radiology    No results found.  Cardiac Studies   Cardiac cath July 14, 2017 -unsuccessful attempted PTCA of occluded distal LAD, chronic occlusion of RCA Diagnostic Diagram       Post-Intervention Diagram        Echo July 14, 2017 - Left ventricle: LVEF is approximately 40% with akinesis of the distal inferoseptal wall and apex.  Aneurysmal dilatation of apex. Consider limited echo with contrast to evaluate for thrombus. The cavity size was normal. Wall thickness was increased in a pattern of mild LVH. - Mitral valve: Calcified annulus. Mildly thickened leaflets  Patient Profile     66 y.o. male presenting with extensive anteroapical and inferior infarction, unsuccessful attempt at revascularization of occluded distal LAD, chronic occlusion of RCA. Moderate reduction inventricular systolic function. Atrial fibrillation with rapid ventricular response present on arrival, resolved with amiodarone.Developed neurological symptoms, and rising Cr. Nephrology re-consulted.   Assessment & Plan    1.CAD s/p STEMI:Unsuccessful PCI to distal LAD occlusion and not optimal surgical candidate at this time; echo consistent with aneurysmal dilation of apex.No recurrent chest pain. --plavix 75mg  daily, atorvastatin 40mg  daily, toprol XL 12.5 mg daily --Eliquis for Afib. Would hold ASA given higher bleeding risk.  2.Acute on chronic systolic CHF: --good urine output with IV lasix with negative fluid balance. Weight unchanged. Still edematous and volume overloaded.  --ACEi has been held --continue IV lasix.   3.AFib: Maintaining sinus rhythm since Nov 16 in setting of acute infarct. I am concerned that amiodarone may be playing a role in his weakness, gait instability, etc. Will DC amiodarone today and monitor. CHADSVasc 5 (age, hypertension, diabetes, heart failure, CAD). --Eliquis   4.Episodes of weakness/acute infarction in left parietal periventricular white matter on MRI: --Does not have clear seizure like activity or stroke like symptoms. He does have known L ICA stenosis --MRI 07/28/17 revealed subcentimeter acute infarction in left parietal periventricular white matter. -- MRA 07/29/17 Showed Interval occlusion of the left internal carotid artery with absence of antegrade flow at the  skullbase. Approximately 70% stenosis of the right vertebral artery at the foramen magnum level. -- due to wife's concerns we have asked Neuro to see today.  5.HTN - blood pressure medications have been reduced back to allow room for diuresis. Will reduce Imdur to 15 mg daily. Otherwise on very low dose Toprol and on hydralazine 25 mg tid.  6.Acute renal failure on CKD stage III/ hyponatramia -Creatnine has steadily improved with IV diuresis. From 2.36>>1.98.  - appreciate nephrology input. Hyponatremia is also steadily improving.   7.OSA: --OnCPAPqhs.  8.DM: --on SSI  Dispo: Overall deconditioned, working with PT/OT. Will need DC to Rehab facility when medically ready.  Signed, Peter Martinique, MD  08/04/2017, 9:05 AM    For questions or updates, please contact [facility] Please consult [email or web address] for contact info under Cardiology/STEMI.

## 2017-08-04 NOTE — Progress Notes (Signed)
  Speech Language Pathology Treatment: Dysphagia  Patient Details Name: Jose [name] MRN: [id number] DOB: 10/08/1950 Today's Date: 08/04/2017 Time: 0950-1005 SLP Time Calculation (min) (ACUTE ONLY): 15 min  Assessment / Plan / Recommendation Clinical Impression  Pt looks brighter, wife present and he reports no symptoms now of odonophagia. Mastication of Dys 3 texture functional and thin liquids consumed subjectively without signs of poor airway protection. Pt reports needing new dentures and desire to remain on Dys 3. Discussed esophageal precautions as pt reports infrequent heartburn. No further ST needed.      HPI HPI: Pt is a 66 y.o. male who presented to [facility] on 07/14/17 with chest pain and dyspnea. On arrival to [facility] ED, his EKG showed new onset A-fib with ST elevation in the inferolateral leads; CODE STEMI was called. Cardiac cath 11/15 with unsuccessful attempt at revascularization of occluded distal LAD, chronic occlusion of RCA. MRI on 07/28/17 revealed subcentimeter acute infarction in left parietal periventricular white matter Pertinent PMH includes CAD, COPD, HL, HTN, obesity,DMand tobacco use. BSE initiated due to neuro changes (onset dysarthria).       SLP Plan  All goals met;Discharge SLP treatment due to (comment)       Recommendations  Diet recommendations: Dysphagia 3 (mechanical soft);Thin liquid Liquids provided via: Cup;Straw Medication Administration: Whole meds with puree Supervision: Patient able to self feed Compensations: Follow solids with liquid;Clear throat intermittently;Multiple dry swallows after each bite/sip Postural Changes and/or Swallow Maneuvers: Seated upright 90 degrees;Upright 30-60 min after meal                Oral Care Recommendations: Oral care BID Follow up Recommendations: None SLP Visit Diagnosis: Dysphagia, pharyngeal phase (R13.13) Plan: All goals met;Discharge SLP treatment due to (comment)                       Houston Siren 08/04/2017, 10:14 AM   Orbie Pyo Colvin Caroli.Ed [employer] [phone]

## 2017-08-04 NOTE — Progress Notes (Signed)
Occupational Therapy Treatment Patient Details Name: Jose [name] MRN: [id number] DOB: Jun 27, 1951 Today's Date: 08/04/2017    History of present illness Pt is a 66 y.o. male who presented to [facility] on 07/14/17 with chest pain and dyspnea. On arrival to [facility] ED, his EKG showed new onset A-fib with ST elevation in the inferolateral leads; CODE STEMI was called. Cardiac cath 11/15 with unsuccessful attempt at revascularization of occluded distal LAD, chronic occlusion of RCA. MRI on 07/28/17 revealed Subcentimeter acute infarction in left parietal periventricular white matter Pertinent PMH includes CAD (CTO of RCA, with residual disease in LAD/Lcx), COPD, HL, HTN, obesity,DMand tobacco use.    OT comments  Pt continues to demonstrate significant cognitive impairment. Noted to drop washcloth without awareness during pericare with R hand. Also demonstrating jerking movement with same UE when drinking. Orthostatic blood pressures taken in conjunction with RN, see flow sheet. Stood at sink leaning on elbows to wash hands. Pt continues to be very appropriate for SNF level rehab.  Follow Up Recommendations  SNF;Supervision/Assistance - 24 hour    Equipment Recommendations       Recommendations for Other Services      Precautions / Restrictions Precautions Precautions: Fall Precaution Comments: intermittent weakness with dizziness Restrictions Weight Bearing Restrictions: No       Mobility Bed Mobility Overal bed mobility: Needs Assistance Bed Mobility: Supine to Sit     Supine to sit: Supervision     General bed mobility comments: pt impulsively sitting up  Transfers Overall transfer level: Needs assistance Equipment used: Rolling walker (2 wheeled) Transfers: Sit to/from Stand Sit to Stand: Min guard;Min assist         General transfer comment: performed mulitple times from bed, cues for hand placement    Balance Overall balance assessment: Needs assistance   Sitting  balance-Leahy Scale: Good       Standing balance-Leahy Scale: Poor Standing balance comment: Relies on UE support                           ADL either performed or assessed with clinical judgement   ADL Overall ADL's : Needs assistance/impaired Eating/Feeding: Supervision/ safety;Sitting Eating/Feeding Details (indicate cue type and reason): jerking movements with drinking with straw Grooming: Wash/dry hands;Standing;Minimal assistance;Cueing for sequencing Grooming Details (indicate cue type and reason): rests elbows on sink, sequenced soap and washing, needed cues for turning off water and to located paper towels             Lower Body Dressing: Supervision/safety;Sitting/lateral leans Lower Body Dressing Details (indicate cue type and reason): socks, pt able to cross foot over opposite knee     Toileting- Clothing Manipulation and Hygiene: Minimal assistance;Sit to/from stand Toileting - Clothing Manipulation Details (indicate cue type and reason): pt dropped washcloth during pericare with R hand and did not notice, continued activity with bare hand     Functional mobility during ADLs: Rolling walker;Moderate assistance;+2 for safety/equipment General ADL Comments: pt with limited standing tolerance due to back pain     Vision       Perception     Praxis      Cognition Arousal/Alertness: Awake/alert Behavior During Therapy: Flat affect Overall Cognitive Status: Impaired/Different from baseline Area of Impairment: Attention;Following commands;Memory;Safety/judgement;Problem solving                   Current Attention Level: Sustained Memory: Decreased short-term memory Following Commands: Follows one step commands with increased  time Safety/Judgement: Decreased awareness of safety;Decreased awareness of deficits   Problem Solving: Slow processing;Difficulty sequencing;Requires verbal cues General Comments: pt with poor immediate memory,  requiring constant cues to keep arm still during orthostatic BPs        Exercises     Shoulder Instructions       General Comments      Pertinent Vitals/ Pain       Pain Assessment: Faces Faces Pain Scale: Hurts even more Pain Location: back Pain Descriptors / Indicators: Discomfort Pain Intervention(s): Repositioned;Monitored during session  Home Living                                          Prior Functioning/Environment              Frequency  Min 2X/week        Progress Toward Goals  OT Goals(current goals can now be found in the care plan section)  Progress towards OT goals: Progressing toward goals  Acute Rehab OT Goals Patient Stated Goal: to go home OT Goal Formulation: With patient Time For Goal Achievement: 08/16/17 Potential to Achieve Goals: [place] Discharge plan remains appropriate    Co-evaluation                 AM-PAC PT "6 Clicks" Daily Activity     Outcome Measure   Help from another person eating meals?: A Little Help from another person taking care of personal grooming?: A Little Help from another person toileting, which includes using toliet, bedpan, or urinal?: A Lot Help from another person bathing (including washing, rinsing, drying)?: A Lot Help from another person to put on and taking off regular upper body clothing?: A Little Help from another person to put on and taking off regular lower body clothing?: A Lot 6 Click Score: 15    End of Session Equipment Utilized During Treatment: Gait belt;Rolling walker  OT Visit Diagnosis: Unsteadiness on feet (R26.81);Other abnormalities of gait and mobility (R26.89);Muscle weakness (generalized) (M62.81);Other symptoms and signs involving cognitive function   Activity Tolerance Patient limited by pain   Patient Left in chair;with family/visitor present(with PT)   Nurse Communication Mobility status(orthostatics)        Time: 8466-5993 OT Time  Calculation (min): 39 min  Charges: OT General Charges $OT Visit: 1 Visit OT Treatments $Self Care/Home Management : 38-52 mins  08/04/2017 Nestor Lewandowsky, OTR/L Pager: [phone] Werner Lean Haze Boyden 08/04/2017, 11:17 AM

## 2017-08-04 NOTE — Progress Notes (Signed)
Physical Therapy Treatment Patient Details Name: Jose [name] MRN: [id number] DOB: 1951/02/14 Today's Date: 08/04/2017    History of Present Illness Pt is a 66 y.o. male who presented to [facility] on 07/14/17 with chest pain and dyspnea. On arrival to [facility] ED, his EKG showed new onset A-fib with ST elevation in the inferolateral leads; CODE STEMI was called. Cardiac cath 11/15 with unsuccessful attempt at revascularization of occluded distal LAD, chronic occlusion of RCA. MRI on 07/28/17 revealed Subcentimeter acute infarction in left parietal periventricular white matter Pertinent PMH includes CAD (CTO of RCA, with residual disease in LAD/Lcx), COPD, HL, HTN, obesity,DMand tobacco use.     PT Comments    Pt sitting EOB with OT upon arrival. Dovetailed OT treatment. Pt able to sit to stand from EOB with min guard for safety. VCs for posture, proximity to walker, and foot placement in relation to RW while ambulating to sink. Pt relied heavily on sink for support while washing hands. No reports of dizziness or "lost time." Ambulated back to recliner. Pt able to consistently remember sequencing for transfers by repeating them to himself. Able to perform multiple sit<>stands with min guard, no LOB, and no dizziness. Performed seated exercises in chair. Fatigued easily and required breaks to complete all reps. VCs for speed of exercises. Pt would benefit from skilled PT to increase endurance and as a result functional mobility.  Follow Up Recommendations  Supervision for mobility/OOB;SNF     Equipment Recommendations  None recommended by PT    Recommendations for Other Services OT consult     Precautions / Restrictions Precautions Precautions: Fall Precaution Comments: intermittent weakness with dizziness Restrictions Weight Bearing Restrictions: No    Mobility  Bed Mobility Overal bed mobility: Needs Assistance Bed Mobility: Supine to Sit     Supine to sit: Supervision     General bed  mobility comments: Sitting EOB with OT upon arrival.  Transfers Overall transfer level: Needs assistance Equipment used: Rolling walker (2 wheeled) Transfers: Sit to/from Stand Sit to Stand: Min guard         General transfer comment: Min guard for safety. Pt remembered proper hand placement today; repeated sequence to himself while doing it. Pt able to power up with no assist.  Ambulation/Gait Ambulation/Gait assistance: Min assist Ambulation Distance (Feet): 10 Feet Assistive device: Rolling walker (2 wheeled) Gait Pattern/deviations: Step-to pattern;Wide base of support;Shuffle Gait velocity: Decreased   General Gait Details: Cues for proximity to walker and to place his feet inside RW. Pt able to ambulate to sink and back with no LOB or weak episodes.   Stairs            Wheelchair Mobility    Modified Rankin (Stroke Patients Only)       Balance Overall balance assessment: Needs assistance Sitting-balance support: Feet supported;Bilateral upper extremity supported Sitting balance-Leahy Scale: Good Sitting balance - Comments: Pt able to sit EOB with no LOB.   Standing balance support: Bilateral upper extremity supported Standing balance-Leahy Scale: Poor Standing balance comment: Relies on UE support. While washing hands at sink relied greatly on counter for support.                            Cognition Arousal/Alertness: Awake/alert Behavior During Therapy: Flat affect Overall Cognitive Status: Impaired/Different from baseline Area of Impairment: Attention;Following commands;Memory;Safety/judgement;Problem solving  Current Attention Level: Sustained Memory: Decreased short-term memory Following Commands: Follows one step commands with increased time Safety/Judgement: Decreased awareness of safety;Decreased awareness of deficits   Problem Solving: Slow processing;Difficulty sequencing;Requires verbal cues General  Comments: Pt did better with remembering correct sequencing for transfers.      Exercises General Exercises - Lower Extremity Ankle Circles/Pumps: AROM;Both;20 reps;Supine Quad Sets: AROM;Both;10 reps;Supine Long Arc Quad: AROM;Both;10 reps;Seated Heel Slides: AROM;Both;10 reps;Seated Hip ABduction/ADduction: AROM;Both;10 reps;Supine Straight Leg Raises: AROM;Both;10 reps;Supine Hip Flexion/Marching: AROM;Both;Seated(30 secs x 2) Toe Raises: AROM;Both;10 reps;Seated Heel Raises: AROM;Both;10 reps;Seated Other Exercises Other Exercises: sit to stand x 3 from recliner    General Comments        Pertinent Vitals/Pain Pain Assessment: No/denies pain Faces Pain Scale: Hurts even more Pain Location: back Pain Descriptors / Indicators: Discomfort Pain Intervention(s): Repositioned;Monitored during session    Home Living                      Prior Function            PT Goals (current goals can now be found in the care plan section) Acute Rehab PT Goals Patient Stated Goal: none discussed PT Goal Formulation: With patient Time For Goal Achievement: 08/18/17 Potential to Achieve Goals: Good Progress towards PT goals: Progressing toward goals    Frequency    Min 3X/week      PT Plan Current plan remains appropriate    Co-evaluation              AM-PAC PT "6 Clicks" Daily Activity  Outcome Measure  Difficulty turning over in bed (including adjusting bedclothes, sheets and blankets)?: None Difficulty moving from lying on back to sitting on the side of the bed? : Unable Difficulty sitting down on and standing up from a chair with arms (e.g., wheelchair, bedside commode, etc,.)?: Unable Help needed moving to and from a bed to chair (including a wheelchair)?: A Little Help needed walking in hospital room?: A Little Help needed climbing 3-5 steps with a railing? : Total 6 Click Score: 13    End of Session Equipment Utilized During Treatment: Gait  belt Activity Tolerance: Patient limited by fatigue;Patient tolerated treatment well Patient left: with call bell/phone within reach;with family/visitor present;in chair;with chair alarm set Nurse Communication: Mobility status PT Visit Diagnosis: Other abnormalities of gait and mobility (R26.89)     Time: 9735-3299 PT Time Calculation (min) (ACUTE ONLY): 29 min  Charges:  $Therapeutic Exercise: 8-22 mins $Therapeutic Activity: 8-22 mins                    G Codes:       Janna Arch, SPTA   Janna Arch 08/04/2017, 2:02 PM

## 2017-08-05 ENCOUNTER — Encounter (INDEPENDENT_AMBULATORY_CARE_PROVIDER_SITE_OTHER): Payer: Non-veteran care | Admitting: Ophthalmology

## 2017-08-05 LAB — RENAL FUNCTION PANEL
ALBUMIN: 2.4 g/dL — AB (ref 3.5–5.0)
ANION GAP: 11 (ref 5–15)
BUN: 53 mg/dL — AB (ref 6–20)
CALCIUM: 8.3 mg/dL — AB (ref 8.9–10.3)
CO2: 34 mmol/L — AB (ref 22–32)
CREATININE: 1.92 mg/dL — AB (ref 0.61–1.24)
Chloride: 81 mmol/L — ABNORMAL LOW (ref 101–111)
GFR calc Af Amer: 40 mL/min — ABNORMAL LOW (ref >60)
GFR calc non Af Amer: 35 mL/min — ABNORMAL LOW (ref >60)
GLUCOSE: 152 mg/dL — AB (ref 65–99)
PHOSPHORUS: 3.7 mg/dL (ref 2.5–4.6)
Potassium: 4 mmol/L (ref 3.5–5.1)
SODIUM: 126 mmol/L — AB (ref 135–145)

## 2017-08-05 LAB — GLUCOSE, CAPILLARY
GLUCOSE-CAPILLARY: 181 mg/dL — AB (ref 65–99)
Glucose-Capillary: 166 mg/dL — ABNORMAL HIGH (ref 65–99)
Glucose-Capillary: 198 mg/dL — ABNORMAL HIGH (ref 65–99)
Glucose-Capillary: 173 mg/dL — ABNORMAL HIGH (ref 65–99)

## 2017-08-05 NOTE — Progress Notes (Signed)
Progress Note  Patient Name: Jose [name] Date of Encounter: 08/05/2017  Primary Cardiologist:   Subjective   No complaints this morning. Looks brighter.   Inpatient Medications    Scheduled Meds: . apixaban  5 mg Oral BID  . clopidogrel  75 mg Oral Daily  . ezetimibe  10 mg Oral Daily  . ferrous sulfate  325 mg Oral Q breakfast  . furosemide  40 mg Intravenous BID  . hydrALAZINE  25 mg Oral Q8H  . Influenza vac split quadrivalent PF  0.5 mL Intramuscular Tomorrow-1000  . insulin aspart  0-5 Units Subcutaneous QHS  . insulin aspart  0-9 Units Subcutaneous TID WC  . insulin aspart  8 Units Subcutaneous TID WC  . insulin glargine  40 Units Subcutaneous BID  . isosorbide mononitrate  15 mg Oral Daily  . magnesium oxide  400 mg Oral Daily  . metoprolol succinate  12.5 mg Oral Daily  . omega-3 acid ethyl esters  2 g Oral Q breakfast  . omega-3 acid ethyl esters  3 g Oral BID AC  . polyethylene glycol  17 g Oral BID  . pravastatin  20 mg Oral q1800  . sertraline  100 mg Oral Daily  . tiotropium  18 mcg Inhalation QHS   Continuous Infusions:  PRN Meds: acetaminophen, albuterol, lidocaine, lidocaine, lidocaine-EPINEPHrine, nitroGLYCERIN, ondansetron (ZOFRAN) IV, oxyCODONE-acetaminophen, oxymetazoline, silver nitrate applicators, TRIPLE ANTIBIOTIC   Vital Signs    Vitals:   08/04/17 1516 08/04/17 1939 08/05/17 0407 08/05/17 1023  BP: (!) 156/59 (!) 124/92 (!) 128/48 137/66  Pulse:  71 (!) 54 60  Resp:  17 12   Temp:  97.9 F (36.6 C) 97.6 F (36.4 C)   TempSrc:  Oral Oral   SpO2:  96% 91%   Weight:   293 lb 3.4 oz (133 kg)   Height:        Intake/Output Summary (Last 24 hours) at 08/05/2017 1047 Last data filed at 08/05/2017 0900 Gross per 24 hour  Intake 1680 ml  Output 2826 ml  Net -1146 ml   Filed Weights   08/03/17 0435 08/04/17 0347 08/05/17 0407  Weight: 295 lb 10.2 oz (134.1 kg) 295 lb 6.7 oz (134 kg) 293 lb 3.4 oz (133 kg)    Telemetry    SR-  sinus brady - Personally Reviewed  Physical Exam   GENERAL:  Chronically ill  appearing HEENT:  PERRL, EOMI, sclera are clear. Oropharynx is clear. NECK:  No jugular venous distention, carotid upstroke brisk and symmetric, no bruits, no thyromegaly or adenopathy LUNGS:  Clear to auscultation bilaterally CHEST:  Unremarkable HEART:  RRR,  PMI not displaced or sustained,S1 and S2 within normal limits, no S3, no S4: no clicks, no rubs, no murmurs ABD:  Soft, nontender. BS +, no masses or bruits. No hepatomegaly, no splenomegaly EXT:  2 + pulses throughout, 2+ edema L>R, no cyanosis no clubbing SKIN:  Warm and dry.  No rashes NEURO:  Alert and oriented x 3. Cranial nerves II through XII intact. PSYCH:  Cognitively intact     Labs    Chemistry Recent Labs  Lab 08/03/17 0316 08/04/17 0253 08/05/17 0127  NA 123* 125* 126*  K 4.1 4.2 4.0  CL 79* 81* 81*  CO2 30 32 34*  GLUCOSE 106* 97 152*  BUN 66* 62* 53*  CREATININE 2.17* 1.98* 1.92*  CALCIUM 8.2* 8.4* 8.3*  ALBUMIN 2.4* 2.5* 2.4*  GFRNONAA 30* 33* 35*  GFRAA 35* 39* 40*  ANIONGAP 14 12 11      Hematology Recent Labs  Lab 08/04/17 0253  WBC 6.2  RBC 3.36*  HGB 9.4*  HCT 28.5*  MCV 84.8  MCH 28.0  MCHC 33.0  RDW 14.6  PLT 336    Cardiac EnzymesNo results for input(s): TROPONINI in the last 168 hours. No results for input(s): TROPIPOC in the last 168 hours.   BNPNo results for input(s): BNP, PROBNP in the last 168 hours.   DDimer No results for input(s): DDIMER in the last 168 hours.    Radiology    No results found.  Cardiac Studies   Cardiac cath July 14, 2017 -unsuccessful attempted PTCA of occluded distal LAD, chronic occlusion of RCA Diagnostic Diagram       Post-Intervention Diagram        Echo July 14, 2017 - Left ventricle: LVEF is approximately 40% with akinesis of the distal inferoseptal wall and apex. Aneurysmal dilatation of apex. Consider limited echo with contrast  to evaluate for thrombus. The cavity size was normal. Wall thickness was increased in a pattern of mild LVH. - Mitral valve: Calcified annulus. Mildly thickened leaflets  Patient Profile     66 y.o. male presenting with extensive anteroapical and inferior infarction, unsuccessful attempt at revascularization of occluded distal LAD, chronic occlusion of RCA. Moderate reduction inventricular systolic function. Atrial fibrillation with rapid ventricular response present on arrival, resolved with amiodarone.Developed neurological symptoms, and rising Cr. Nephrology re-consulted.   Assessment & Plan    1.CAD s/p STEMI:Unsuccessful PCI to distal LAD occlusion and not optimal surgical candidate at this time; echo consistent with aneurysmal dilation of apex.No recurrent chest pain. --plavix 75mg  daily, atorvastatin 40mg  daily, toprol XL 12.5 mg daily --Eliquis for Afib. Would hold ASA given higher bleeding risk.  2.Acute on chronic systolic CHF: --good urine output with IV lasix with negative fluid balance. Weight decreasing. Still edematous and volume overloaded.  --ACEi has been held --continue IV lasix.   3.AFib: Maintaining sinus rhythm since Nov 16 in setting of acute infarct. I am concerned that amiodarone was contributing to his weakness, gait instability, etc. Amiodarone discontinued yesterday.  CHADSVasc 5 (age, hypertension, diabetes, heart failure, CAD). --Eliquis   4.Episodes of weakness/acute infarction in left parietal periventricular white matter on MRI: --Does not have clear seizure like activity or stroke like symptoms. He does have known L ICA stenosis --MRI 07/28/17 revealed subcentimeter acute infarction in left parietal periventricular white matter. -- MRA 07/29/17 Showed Interval occlusion of the left internal carotid artery with absence of antegrade flow at the skullbase. Approximately 70% stenosis of the right vertebral artery at the  foramen magnum level. -- Neuro saw patient yesterday. Did not feel he had any new Neuro deficits and felt much of what we are seeing due to deconditioning/meds  5.HTN - blood pressure medications have been reduced back to allow room for diuresis. On low dose Toprol, Imdur and hydralazine. BP improved.   6.Acute renal failure on CKD stage III/ hyponatramia -Creatnine has steadily improved with IV diuresis. From 2.36>>1.92.  - appreciate nephrology input. Hyponatremia is also steadily improving.   7.OSA: --OnCPAPqhs.  8.DM: --on SSI  Dispo: Overall deconditioned, working with PT/OT. Will need DC to Rehab facility when medically ready. I would anticipate given clinical course that this will be early next week.   Signed, Rawson Minix [place], MD  08/05/2017, 10:47 AM    For questions or updates, please contact [facility] Please consult [email or web address] for contact info under Cardiology/STEMI.

## 2017-08-05 NOTE — Progress Notes (Signed)
Subjective:  BP overall higher - had another 2500 UOP- sodium up, crt down weight down - wife says better Objective Vital signs in last 24 hours: Vitals:   08/04/17 1310 08/04/17 1516 08/04/17 1939 08/05/17 0407  BP: 120/63 (!) 156/59 (!) 124/92 (!) 128/48  Pulse: 63  71 (!) 54  Resp: 16  17 12   Temp: 98.1 F (36.7 C)  97.9 F (36.6 C) 97.6 F (36.4 C)  TempSrc: Oral  Oral Oral  SpO2: 94%  96% 91%  Weight:    133 kg (293 lb 3.4 oz)  Height:       Weight change: -1 kg (-3.3 oz)  Intake/Output Summary (Last 24 hours) at 08/05/2017 0920 Last data filed at 08/05/2017 3500 Gross per 24 hour  Intake 1440 ml  Output 2575 ml  Net -1135 ml   Assessment/ Plan: Pt is a 66 y.o. yo male with DM who was admitted on 07/14/2017 with STEMI and AKI with volume overload- got better then got worse  Assessment/Plan: 1. HTN/vol- Pt remains volume overloaded and maybe had had BP too low for him given dizziness and inc in crt.  stopped norvasc, and dec hydralazine-stopped toprol - cont lasix at 40 IV q 12 - slow progress 2. Renal- baseline crt 1.2- had AKI in the setting of STEMI- peaked at 2.9- improved to 1.44 but then started to rise again-  U/A not remarkable - suspect hemodynamic due to relative hypotension or orthostatic hypotension- is improving 3. Hyponatremia- could be as simple as volume overload.   urinary osm indicates that he is appropriately trying to dilute urine so not SIADH, cortisol OK.  Sodium better again slightly  this AM 4. Dec MS- unclear how much narcotics/antiemetics he is getting, I stopped the IV form- dont know if due to sodium or low HR/BP given wife says he has carotid stenosis- - primary to get neuro back involved-  Thinking just metabolic and signed off- he is better    Melody Cirrincione A    Labs: Basic Metabolic Panel: Recent Labs  Lab 08/03/17 0316 08/04/17 0253 08/05/17 0127  NA 123* 125* 126*  K 4.1 4.2 4.0  CL 79* 81* 81*  CO2 30 32 34*  GLUCOSE 106* 97  152*  BUN 66* 62* 53*  CREATININE 2.17* 1.98* 1.92*  CALCIUM 8.2* 8.4* 8.3*  PHOS 4.7* 4.2 3.7   Liver Function Tests: Recent Labs  Lab 08/03/17 0316 08/04/17 0253 08/05/17 0127  ALBUMIN 2.4* 2.5* 2.4*   No results for input(s): LIPASE, AMYLASE in the last 168 hours. No results for input(s): AMMONIA in the last 168 hours. CBC: Recent Labs  Lab 08/04/17 0253  WBC 6.2  HGB 9.4*  HCT 28.5*  MCV 84.8  PLT 336   Cardiac Enzymes: No results for input(s): CKTOTAL, CKMB, CKMBINDEX, TROPONINI in the last 168 hours. CBG: Recent Labs  Lab 08/04/17 0556 08/04/17 1144 08/04/17 1607 08/04/17 2051 08/05/17 0553  GLUCAP 120* 175* 153* 179* 166*    Iron Studies: No results for input(s): IRON, TIBC, TRANSFERRIN, FERRITIN in the last 72 hours. Studies/Results: No results found. Medications: Infusions:   Scheduled Medications: . apixaban  5 mg Oral BID  . clopidogrel  75 mg Oral Daily  . ezetimibe  10 mg Oral Daily  . ferrous sulfate  325 mg Oral Q breakfast  . furosemide  40 mg Intravenous BID  . hydrALAZINE  25 mg Oral Q8H  . Influenza vac split quadrivalent PF  0.5 mL Intramuscular Tomorrow-1000  .  insulin aspart  0-5 Units Subcutaneous QHS  . insulin aspart  0-9 Units Subcutaneous TID WC  . insulin aspart  8 Units Subcutaneous TID WC  . insulin glargine  40 Units Subcutaneous BID  . isosorbide mononitrate  15 mg Oral Daily  . magnesium oxide  400 mg Oral Daily  . metoprolol succinate  12.5 mg Oral Daily  . omega-3 acid ethyl esters  2 g Oral Q breakfast  . omega-3 acid ethyl esters  3 g Oral BID AC  . polyethylene glycol  17 g Oral BID  . pravastatin  20 mg Oral q1800  . sertraline  100 mg Oral Daily  . tiotropium  18 mcg Inhalation QHS    have reviewed scheduled and prn medications.  Physical Exam: General: obese, chronically ill- a little more alert this AM  Heart: brady Lungs: dec BS at bases, poor effort Abdomen: obese, soft , nontender Extremities:  pitting edema still     08/05/2017,9:20 AM  LOS: 22 days

## 2017-08-06 LAB — GLUCOSE, CAPILLARY
GLUCOSE-CAPILLARY: 187 mg/dL — AB (ref 65–99)
GLUCOSE-CAPILLARY: 162 mg/dL — AB (ref 65–99)
Glucose-Capillary: 186 mg/dL — ABNORMAL HIGH (ref 65–99)
Glucose-Capillary: 218 mg/dL — ABNORMAL HIGH (ref 65–99)

## 2017-08-06 LAB — RENAL FUNCTION PANEL
ALBUMIN: 2.7 g/dL — AB (ref 3.5–5.0)
ANION GAP: 12 (ref 5–15)
BUN: 47 mg/dL — ABNORMAL HIGH (ref 6–20)
CO2: 34 mmol/L — ABNORMAL HIGH (ref 22–32)
Calcium: 8.8 mg/dL — ABNORMAL LOW (ref 8.9–10.3)
Chloride: 82 mmol/L — ABNORMAL LOW (ref 101–111)
Creatinine, Ser: 1.93 mg/dL — ABNORMAL HIGH (ref 0.61–1.24)
GFR, EST AFRICAN AMERICAN: 40 mL/min — AB (ref >60)
GFR, EST NON AFRICAN AMERICAN: 35 mL/min — AB (ref >60)
Glucose, Bld: 150 mg/dL — ABNORMAL HIGH (ref 65–99)
PHOSPHORUS: 3.5 mg/dL (ref 2.5–4.6)
POTASSIUM: 4.7 mmol/L (ref 3.5–5.1)
Sodium: 128 mmol/L — ABNORMAL LOW (ref 135–145)

## 2017-08-06 MED ORDER — HYDRALAZINE HCL 25 MG PO TABS
25.0000 mg | ORAL_TABLET | Freq: Two times a day (BID) | ORAL | Status: DC
  Administered 2017-08-06 – 2017-08-07 (×2): 25 mg via ORAL
  Filled 2017-08-06 (×3): qty 1

## 2017-08-06 MED ORDER — FUROSEMIDE 40 MG PO TABS
40.0000 mg | ORAL_TABLET | Freq: Two times a day (BID) | ORAL | Status: DC
  Administered 2017-08-06 – 2017-08-09 (×6): 40 mg via ORAL
  Filled 2017-08-06 (×6): qty 1

## 2017-08-06 NOTE — Progress Notes (Signed)
Subjective:  BP OK - had only 1500 UOP- sodium up, crt stable-  weight down - wife says better Objective Vital signs in last 24 hours: Vitals:   08/05/17 1515 08/05/17 2012 08/05/17 2016 08/06/17 0529  BP: (!) 126/53  (!) 148/72 128/61  Pulse:   (!) 57 60  Resp:   18 (!) 9  Temp:   97.7 F (36.5 C) 97.6 F (36.4 C)  TempSrc:   Oral Axillary  SpO2:  98% 100% 97%  Weight:    130.2 kg (287 lb 0.6 oz)  Height:       Weight change: -2.8 kg (-2.8 oz)  Intake/Output Summary (Last 24 hours) at 08/06/2017 4098 Last data filed at 08/05/2017 2032 Gross per 24 hour  Intake 280 ml  Output 1502 ml  Net -1222 ml   Assessment/ Plan: Pt is a 66 y.o. yo male with DM who was admitted on 07/14/2017 with STEMI and AKI with volume overload- got better then got worse  Assessment/Plan: 1. HTN/vol- Pt remains volume overloaded and maybe had had BP too low for him given dizziness and inc in crt.  stopped norvasc, and dec hydralazine-stopped toprol - now restarted HR 54 ?- cont lasix but will change to PO - slow progress- lowest weight this hosp was 281- is at 287 today 2. Renal- baseline crt 1.2- had AKI in the setting of STEMI- peaked at 2.9- improved to 1.44 but then started to rise again-  U/A not remarkable - suspect hemodynamic due to relative hypotension or orthostatic hypotension- is improving 3. Hyponatremia- could be as simple as volume overload.   urinary osm indicates that he is appropriately trying to dilute urine so not SIADH, cortisol OK.  Sodium better again slightly  this AM 4. Dec MS- - dont know if due to sodium or low HR/BP given wife says he has carotid stenosis- -  neuro felt just metabolic and signed off- he is better    [place] Strieter A    Labs: Basic Metabolic Panel: Recent Labs  Lab 08/04/17 0253 08/05/17 0127 08/06/17 0218  NA 125* 126* 128*  K 4.2 4.0 4.7  CL 81* 81* 82*  CO2 32 34* 34*  GLUCOSE 97 152* 150*  BUN 62* 53* 47*  CREATININE 1.98* 1.92* 1.93*   CALCIUM 8.4* 8.3* 8.8*  PHOS 4.2 3.7 3.5   Liver Function Tests: Recent Labs  Lab 08/04/17 0253 08/05/17 0127 08/06/17 0218  ALBUMIN 2.5* 2.4* 2.7*   No results for input(s): LIPASE, AMYLASE in the last 168 hours. No results for input(s): AMMONIA in the last 168 hours. CBC: Recent Labs  Lab 08/04/17 0253  WBC 6.2  HGB 9.4*  HCT 28.5*  MCV 84.8  PLT 336   Cardiac Enzymes: No results for input(s): CKTOTAL, CKMB, CKMBINDEX, TROPONINI in the last 168 hours. CBG: Recent Labs  Lab 08/05/17 0553 08/05/17 1125 08/05/17 1610 08/05/17 2030 08/06/17 0646  GLUCAP 166* 198* 173* 181* 186*    Iron Studies: No results for input(s): IRON, TIBC, TRANSFERRIN, FERRITIN in the last 72 hours. Studies/Results: No results found. Medications: Infusions:   Scheduled Medications: . apixaban  5 mg Oral BID  . clopidogrel  75 mg Oral Daily  . ezetimibe  10 mg Oral Daily  . ferrous sulfate  325 mg Oral Q breakfast  . furosemide  40 mg Intravenous BID  . hydrALAZINE  25 mg Oral Q8H  . Influenza vac split quadrivalent PF  0.5 mL Intramuscular Tomorrow-1000  . insulin aspart  0-5  Units Subcutaneous QHS  . insulin aspart  0-9 Units Subcutaneous TID WC  . insulin aspart  8 Units Subcutaneous TID WC  . insulin glargine  40 Units Subcutaneous BID  . isosorbide mononitrate  15 mg Oral Daily  . magnesium oxide  400 mg Oral Daily  . metoprolol succinate  12.5 mg Oral Daily  . omega-3 acid ethyl esters  2 g Oral Q breakfast  . omega-3 acid ethyl esters  3 g Oral BID AC  . polyethylene glycol  17 g Oral BID  . pravastatin  20 mg Oral q1800  . sertraline  100 mg Oral Daily  . tiotropium  18 mcg Inhalation QHS    have reviewed scheduled and prn medications.  Physical Exam: General: obese, chronically ill-  more alert again this AM  Heart: brady Lungs: dec BS at bases, poor effort Abdomen: obese, soft , nontender Extremities: pitting edema still     08/06/2017,8:28 AM  LOS: 23 days

## 2017-08-06 NOTE — Progress Notes (Signed)
Progress Note  Patient Name: Jose [name] Date of Encounter: 08/06/2017  Primary Cardiologist: No primary care provider on file.   Subjective   No complaints  Inpatient Medications    Scheduled Meds: . apixaban  5 mg Oral BID  . clopidogrel  75 mg Oral Daily  . ezetimibe  10 mg Oral Daily  . ferrous sulfate  325 mg Oral Q breakfast  . furosemide  40 mg Oral BID  . hydrALAZINE  25 mg Oral BID  . Influenza vac split quadrivalent PF  0.5 mL Intramuscular Tomorrow-1000  . insulin aspart  0-5 Units Subcutaneous QHS  . insulin aspart  0-9 Units Subcutaneous TID WC  . insulin aspart  8 Units Subcutaneous TID WC  . insulin glargine  40 Units Subcutaneous BID  . isosorbide mononitrate  15 mg Oral Daily  . magnesium oxide  400 mg Oral Daily  . metoprolol succinate  12.5 mg Oral Daily  . omega-3 acid ethyl esters  2 g Oral Q breakfast  . omega-3 acid ethyl esters  3 g Oral BID AC  . polyethylene glycol  17 g Oral BID  . pravastatin  20 mg Oral q1800  . sertraline  100 mg Oral Daily  . tiotropium  18 mcg Inhalation QHS   Continuous Infusions:  PRN Meds: acetaminophen, albuterol, lidocaine, lidocaine, lidocaine-EPINEPHrine, nitroGLYCERIN, ondansetron (ZOFRAN) IV, oxyCODONE-acetaminophen, oxymetazoline, silver nitrate applicators, TRIPLE ANTIBIOTIC   Vital Signs    Vitals:   08/05/17 1515 08/05/17 2012 08/05/17 2016 08/06/17 0529  BP: (!) 126/53  (!) 148/72 128/61  Pulse:   (!) 57 60  Resp:   18 (!) 9  Temp:   97.7 F (36.5 C) 97.6 F (36.4 C)  TempSrc:   Oral Axillary  SpO2:  98% 100% 97%  Weight:    287 lb 0.6 oz (130.2 kg)  Height:        Intake/Output Summary (Last 24 hours) at 08/06/2017 1009 Last data filed at 08/05/2017 2032 Gross per 24 hour  Intake 280 ml  Output 1251 ml  Net -971 ml   Filed Weights   08/04/17 0347 08/05/17 0407 08/06/17 0529  Weight: 295 lb 6.7 oz (134 kg) 293 lb 3.4 oz (133 kg) 287 lb 0.6 oz (130.2 kg)    Telemetry    SR -  Personally Reviewed  ECG     Physical Exam   GEN: No acute distress.   Neck: No JVD Cardiac: RRR, no murmurs, rubs, or gallops.  Respiratory: Clear to auscultation bilaterally. GI: Soft, nontender, non-distended  MS: 1+ bilateral edema; No deformity. Neuro:  Nonfocal  Psych: Normal affect   Labs    Chemistry Recent Labs  Lab 08/04/17 0253 08/05/17 0127 08/06/17 0218  NA 125* 126* 128*  K 4.2 4.0 4.7  CL 81* 81* 82*  CO2 32 34* 34*  GLUCOSE 97 152* 150*  BUN 62* 53* 47*  CREATININE 1.98* 1.92* 1.93*  CALCIUM 8.4* 8.3* 8.8*  ALBUMIN 2.5* 2.4* 2.7*  GFRNONAA 33* 35* 35*  GFRAA 39* 40* 40*  ANIONGAP 12 11 12      Hematology Recent Labs  Lab 08/04/17 0253  WBC 6.2  RBC 3.36*  HGB 9.4*  HCT 28.5*  MCV 84.8  MCH 28.0  MCHC 33.0  RDW 14.6  PLT 336    Cardiac EnzymesNo results for input(s): TROPONINI in the last 168 hours. No results for input(s): TROPIPOC in the last 168 hours.   BNPNo results for input(s): BNP, PROBNP in the  last 168 hours.   DDimer No results for input(s): DDIMER in the last 168 hours.   Radiology    No results found.  Cardiac Studies    Patient Profile     66 y.o. male presenting with extensive anteroapical and inferior infarction, unsuccessful attempt at revascularization of occluded distal LAD, chronic occlusion of RCA. Moderate reduction inventricular systolic function. Atrial fibrillation with rapid ventricular response present on arrival, resolved with amiodarone.Developed neurological symptoms, and rising Cr. Nephrology re-consulted.     Assessment & Plan    1. CAD with STEMI/ICM - Unsuccessful PCI to distal LAD occlusion and not optimal surgical candidate at this time; - echo 06/2017 LVEF 40%, apical hypokinesis - medical therapy with plavix 75 (no asa since on eliquis), zetia, hydral 25mg  bid, imdur 15, TOprol 12.5, prava 20. No ACE/ARB/aldactone/entresto due to poor renal function  2. Acute on chronic systolic  HF - medical therapy limited by poor renal function - negative roughly 1 liter yesterda, negative 2.5 liters since admit. Renal function stable. Currnently on lasix 40mg  po bid, changed to oral this morning per renal, appreciate there assistance - remains volume overloaded, continue to follow with diuretics   3. Afib - on eliquis - rate control with Toprol   4. AKI on CKD - management with assistance of renal. Changed to oral lasix today - thought injury possibly to relative hypotension, bp/cardiac meds have been decreased   For questions or updates, please contact [place] Please consult [email or web address] for contact info under Cardiology/STEMI.      Merrily Pew, MD  08/06/2017, 10:09 AM

## 2017-08-07 LAB — RENAL FUNCTION PANEL
Albumin: 2.5 g/dL — ABNORMAL LOW (ref 3.5–5.0)
Anion gap: 10 (ref 5–15)
BUN: 45 mg/dL — AB (ref 6–20)
CALCIUM: 8.5 mg/dL — AB (ref 8.9–10.3)
CHLORIDE: 86 mmol/L — AB (ref 101–111)
CO2: 32 mmol/L (ref 22–32)
Creatinine, Ser: 1.75 mg/dL — ABNORMAL HIGH (ref 0.61–1.24)
GFR calc non Af Amer: 39 mL/min — ABNORMAL LOW (ref >60)
GFR, EST AFRICAN AMERICAN: 45 mL/min — AB (ref >60)
Glucose, Bld: 167 mg/dL — ABNORMAL HIGH (ref 65–99)
PHOSPHORUS: 3.4 mg/dL (ref 2.5–4.6)
Potassium: 4 mmol/L (ref 3.5–5.1)
Sodium: 128 mmol/L — ABNORMAL LOW (ref 135–145)

## 2017-08-07 LAB — GLUCOSE, CAPILLARY
GLUCOSE-CAPILLARY: 189 mg/dL — AB (ref 65–99)
Glucose-Capillary: 257 mg/dL — ABNORMAL HIGH (ref 65–99)
Glucose-Capillary: 164 mg/dL — ABNORMAL HIGH (ref 65–99)
Glucose-Capillary: 183 mg/dL — ABNORMAL HIGH (ref 65–99)

## 2017-08-07 NOTE — Progress Notes (Signed)
Progress Note  Patient Name: Jose [name] Date of Encounter: 08/07/2017  Primary Cardiologist: Harl Bowie  Subjective   No complaints  Inpatient Medications    Scheduled Meds: . apixaban  5 mg Oral BID  . clopidogrel  75 mg Oral Daily  . ezetimibe  10 mg Oral Daily  . ferrous sulfate  325 mg Oral Q breakfast  . furosemide  40 mg Oral BID  . Influenza vac split quadrivalent PF  0.5 mL Intramuscular Tomorrow-1000  . insulin aspart  0-5 Units Subcutaneous QHS  . insulin aspart  0-9 Units Subcutaneous TID WC  . insulin aspart  8 Units Subcutaneous TID WC  . insulin glargine  40 Units Subcutaneous BID  . isosorbide mononitrate  15 mg Oral Daily  . magnesium oxide  400 mg Oral Daily  . metoprolol succinate  12.5 mg Oral Daily  . omega-3 acid ethyl esters  2 g Oral Q breakfast  . omega-3 acid ethyl esters  3 g Oral BID AC  . polyethylene glycol  17 g Oral BID  . pravastatin  20 mg Oral q1800  . sertraline  100 mg Oral Daily  . tiotropium  18 mcg Inhalation QHS   Continuous Infusions:  PRN Meds: acetaminophen, albuterol, lidocaine, lidocaine, lidocaine-EPINEPHrine, nitroGLYCERIN, ondansetron (ZOFRAN) IV, oxyCODONE-acetaminophen, oxymetazoline, silver nitrate applicators, TRIPLE ANTIBIOTIC   Vital Signs    Vitals:   08/06/17 1934 08/06/17 2048 08/07/17 0610 08/07/17 0817  BP: (!) 148/65 133/67 (!) 107/40 119/74  Pulse: 64 65 65 72  Resp: (!) 22 16 (!) 9 16  Temp: 97.7 F (36.5 C) 98.1 F (36.7 C) (!) 96.9 F (36.1 C) (!) 97.5 F (36.4 C)  TempSrc: Oral Oral Axillary Oral  SpO2: 96% 92% 98% 98%  Weight:   287 lb 0.6 oz (130.2 kg)   Height:        Intake/Output Summary (Last 24 hours) at 08/07/2017 0917 Last data filed at 08/06/2017 1000 Gross per 24 hour  Intake -  Output 750 ml  Net -750 ml   Filed Weights   08/05/17 0407 08/06/17 0529 08/07/17 0610  Weight: 293 lb 3.4 oz (133 kg) 287 lb 0.6 oz (130.2 kg) 287 lb 0.6 oz (130.2 kg)    Telemetry    NSR -  Personally Reviewed  ECG    n/a  Physical Exam   GEN: No acute distress.   Neck: No JVD Cardiac: RRR, no murmurs, rubs, or gallops.  Respiratory: Clear to auscultation bilaterally. GI: Soft, nontender, non-distended  MS: 1+ bilateral LE edema; No deformity. Neuro:  Nonfocal  Psych: Normal affect   Labs    Chemistry Recent Labs  Lab 08/05/17 0127 08/06/17 0218 08/07/17 0228  NA 126* 128* 128*  K 4.0 4.7 4.0  CL 81* 82* 86*  CO2 34* 34* 32  GLUCOSE 152* 150* 167*  BUN 53* 47* 45*  CREATININE 1.92* 1.93* 1.75*  CALCIUM 8.3* 8.8* 8.5*  ALBUMIN 2.4* 2.7* 2.5*  GFRNONAA 35* 35* 39*  GFRAA 40* 40* 45*  ANIONGAP 11 12 10      Hematology Recent Labs  Lab 08/04/17 0253  WBC 6.2  RBC 3.36*  HGB 9.4*  HCT 28.5*  MCV 84.8  MCH 28.0  MCHC 33.0  RDW 14.6  PLT 336    Cardiac EnzymesNo results for input(s): TROPONINI in the last 168 hours. No results for input(s): TROPIPOC in the last 168 hours.   BNPNo results for input(s): BNP, PROBNP in the last 168 hours.  DDimer No results for input(s): DDIMER in the last 168 hours.   Radiology    No results found.  Cardiac Studies     Patient Profile     66 y.o.malepresenting with extensive anteroapical and inferior infarction, unsuccessful attempt at revascularization of occluded distal LAD, chronic occlusion of RCA. Moderate reduction inventricular systolic function. Atrial fibrillation with rapid ventricular response present on arrival, resolved with amiodarone.Developed neurological symptoms, and rising Cr. Nephrology re-consulted.     Assessment & Plan   1. CAD with STEMI/ICM - Unsuccessful PCI to distal LAD occlusion and not optimal surgical candidate at this time per interventional cardiology - echo 06/2017 LVEF 40%, apical hypokinesis - medical therapy with plavix 75 (no asa since on eliquis), zetia, hydral 25mg  bid, imdur 15, TOprol 12.5, prava 20. No ACE/ARB/aldactone/entresto due to poor renal  function  2. Acute on chronic systolic HF - medical therapy limited by poor renal function - negative roughly 1 liter yesterda, negative 750 milliliters since admit. Renal changed IV lasix to oral yesterday, Cr downtrending since yesterday.  - remains volume overloaded, continue to follow with diuretics - hydralazine stopped due to occasional soft bp's. Working to optimize renal function. Now only on TOprol 12.5 and imdu 15.    3. Afib - on eliquis - rate control with Toprol   4. AKI on CKD - management with assistance of renal. Changed to oral lasix today - thought injury possibly to relative hypotension, bp/cardiac meds have been decreased. Hydralazine stopped this AM due to some continued low bp's at times     For questions or updates, please contact [place] Please consult [email or web address] for contact info under Cardiology/STEMI.      Merrily Pew, MD  08/07/2017, 9:17 AM

## 2017-08-07 NOTE — Progress Notes (Signed)
Subjective:  BP lower again- had only 750  UOP recorded, weight same- sodium stable, crt down- telemedicine visit today  Objective Vital signs in last 24 hours: Vitals:   08/06/17 1934 08/06/17 2048 08/07/17 0610 08/07/17 0817  BP: (!) 148/65 133/67 (!) 107/40 119/74  Pulse: 64 65 65 72  Resp: (!) 22 16 (!) 9 16  Temp: 97.7 F (36.5 C) 98.1 F (36.7 C) (!) 96.9 F (36.1 C) (!) 97.5 F (36.4 C)  TempSrc: Oral Oral Axillary Oral  SpO2: 96% 92% 98% 98%  Weight:   130.2 kg (287 lb 0.6 oz)   Height:       Weight change: 0 kg (0 lb)  Intake/Output Summary (Last 24 hours) at 08/07/2017 0852 Last data filed at 08/06/2017 1000 Gross per 24 hour  Intake -  Output 750 ml  Net -750 ml   Assessment/ Plan: Pt is Jose [name] 66 y.o. yo male with DM who was admitted on 07/14/2017 with STEMI and AKI with volume overload- got better then got worse  Assessment/Plan: 1. HTN/vol- Pt remains volume overloaded and maybe had had BP too low for him given dizziness and inc in crt.  stopped norvasc, now stopped hydralazine-stopped toprol - butrestarted at lower dose- cont lasix but  changed to PO - slow progress- lowest weight this hosp was 281- is at 287 today.  One shift of UOP missed so will keep lasix dose the same 2. Renal- baseline crt 1.2- had AKI in the setting of STEMI- peaked at 2.9- improved to 1.44 but then started to rise again-  U/Jose [name] not remarkable - suspect hemodynamic due to relative hypotension or orthostatic hypotension- is improving slowly  3. Hyponatremia- could be as simple as volume overload.   urinary osm indicates that he is appropriately trying to dilute urine so not SIADH, cortisol OK.  Sodium stable but better overall 4. Dec MS- - dont know if due to sodium or low HR/BP given wife says he has carotid stenosis- -  neuro felt just metabolic and signed off- he is better    Jose Jose [name]    Labs: Basic Metabolic Panel: Recent Labs  Lab 08/05/17 0127 08/06/17 0218 08/07/17 0228   NA 126* 128* 128*  K 4.0 4.7 4.0  CL 81* 82* 86*  CO2 34* 34* 32  GLUCOSE 152* 150* 167*  BUN 53* 47* 45*  CREATININE 1.92* 1.93* 1.75*  CALCIUM 8.3* 8.8* 8.5*  PHOS 3.7 3.5 3.4   Liver Function Tests: Recent Labs  Lab 08/05/17 0127 08/06/17 0218 08/07/17 0228  ALBUMIN 2.4* 2.7* 2.5*   No results for input(s): LIPASE, AMYLASE in the last 168 hours. No results for input(s): AMMONIA in the last 168 hours. CBC: Recent Labs  Lab 08/04/17 0253  WBC 6.2  HGB 9.4*  HCT 28.5*  MCV 84.8  PLT 336   Cardiac Enzymes: No results for input(s): CKTOTAL, CKMB, CKMBINDEX, TROPONINI in the last 168 hours. CBG: Recent Labs  Lab 08/06/17 0646 08/06/17 1136 08/06/17 1652 08/06/17 2046 08/07/17 0622  GLUCAP 186* 218* 187* 162* 189*    Iron Studies: No results for input(s): IRON, TIBC, TRANSFERRIN, FERRITIN in the last 72 hours. Studies/Results: No results found. Medications: Infusions:   Scheduled Medications: . apixaban  5 mg Oral BID  . clopidogrel  75 mg Oral Daily  . ezetimibe  10 mg Oral Daily  . ferrous sulfate  325 mg Oral Q breakfast  . furosemide  40 mg Oral BID  . hydrALAZINE  25 mg  Oral BID  . Influenza vac split quadrivalent PF  0.5 mL Intramuscular Tomorrow-1000  . insulin aspart  0-5 Units Subcutaneous QHS  . insulin aspart  0-9 Units Subcutaneous TID WC  . insulin aspart  8 Units Subcutaneous TID WC  . insulin glargine  40 Units Subcutaneous BID  . isosorbide mononitrate  15 mg Oral Daily  . magnesium oxide  400 mg Oral Daily  . metoprolol succinate  12.5 mg Oral Daily  . omega-3 acid ethyl esters  2 g Oral Q breakfast  . omega-3 acid ethyl esters  3 g Oral BID AC  . polyethylene glycol  17 g Oral BID  . pravastatin  20 mg Oral q1800  . sertraline  100 mg Oral Daily  . tiotropium  18 mcg Inhalation QHS    have reviewed scheduled and prn medications.  Physical Exam: Not done as telemedicine visit today due to weather     08/07/2017,8:52 AM   LOS: 24 days

## 2017-08-08 LAB — RENAL FUNCTION PANEL
ALBUMIN: 2.7 g/dL — AB (ref 3.5–5.0)
Anion gap: 10 (ref 5–15)
BUN: 39 mg/dL — AB (ref 6–20)
CHLORIDE: 87 mmol/L — AB (ref 101–111)
CO2: 33 mmol/L — AB (ref 22–32)
Calcium: 8.6 mg/dL — ABNORMAL LOW (ref 8.9–10.3)
Creatinine, Ser: 1.73 mg/dL — ABNORMAL HIGH (ref 0.61–1.24)
GFR calc Af Amer: 46 mL/min — ABNORMAL LOW (ref >60)
GFR calc non Af Amer: 39 mL/min — ABNORMAL LOW (ref >60)
GLUCOSE: 159 mg/dL — AB (ref 65–99)
PHOSPHORUS: 3.4 mg/dL (ref 2.5–4.6)
POTASSIUM: 4.2 mmol/L (ref 3.5–5.1)
Sodium: 130 mmol/L — ABNORMAL LOW (ref 135–145)

## 2017-08-08 LAB — GLUCOSE, CAPILLARY
GLUCOSE-CAPILLARY: 205 mg/dL — AB (ref 65–99)
GLUCOSE-CAPILLARY: 170 mg/dL — AB (ref 65–99)
Glucose-Capillary: 146 mg/dL — ABNORMAL HIGH (ref 65–99)
Glucose-Capillary: 208 mg/dL — ABNORMAL HIGH (ref 65–99)

## 2017-08-08 NOTE — Progress Notes (Signed)
Physical Therapy Treatment Patient Details Name: Jose [name] MRN: [id number] DOB: 11-17-1950 Today's Date: 08/08/2017    History of Present Illness Pt is a 66 y.o. male who presented to [facility] on 07/14/17 with chest pain and dyspnea. On arrival to [facility] ED, his EKG showed new onset A-fib with ST elevation in the inferolateral leads; CODE STEMI was called. Cardiac cath 11/15 with unsuccessful attempt at revascularization of occluded distal LAD, chronic occlusion of RCA. MRI on 07/28/17 revealed Subcentimeter acute infarction in left parietal periventricular white matter. Pertinent PMH includes CAD (CTO of RCA, with residual disease in LAD/Lcx), COPD, HL, HTN, obesity,DMand tobacco use.    PT Comments    Pt able to amb 60' with RW and min guard for safety. Remains limited by significant decrease in activity tolerance, but motivated to participate with therapy. Still feel SNF-level therapies are appropriate prior to returning home. Wife present throughout session. Will continue to follow acutely.    Follow Up Recommendations  SNF;Supervision for mobility/OOB     Equipment Recommendations  None recommended by PT    Recommendations for Other Services       Precautions / Restrictions Precautions Precautions: Fall Restrictions Weight Bearing Restrictions: No    Mobility  Bed Mobility Overal bed mobility: Needs Assistance Bed Mobility: Supine to Sit     Supine to sit: Supervision        Transfers Overall transfer level: Needs assistance Equipment used: Rolling walker (2 wheeled) Transfers: Sit to/from Stand Sit to Stand: Min guard         General transfer comment: Slightly impulsive with movement, requiring cues to slow down   Ambulation/Gait Ambulation/Gait assistance: Min guard Ambulation Distance (Feet): 70 Feet Assistive device: Rolling walker (2 wheeled) Gait Pattern/deviations: Step-through pattern;Trunk flexed Gait velocity: Decreased Gait velocity  interpretation: <1.8 ft/sec, indicative of risk for recurrent falls General Gait Details: Repeated cues for closer proximity to RW. 1x standing rest break secondary to fatigue. Cues to slow pace for safety. Very easily fatigued   Stairs            Wheelchair Mobility    Modified Rankin (Stroke Patients Only)       Balance Overall balance assessment: Needs assistance Sitting-balance support: Feet supported;Bilateral upper extremity supported Sitting balance-Leahy Scale: Good     Standing balance support: Bilateral upper extremity supported Standing balance-Leahy Scale: Poor                              Cognition Arousal/Alertness: Awake/alert Behavior During Therapy: WFL for tasks assessed/performed Overall Cognitive Status: Impaired/Different from baseline Area of Impairment: Safety/judgement                         Safety/Judgement: Decreased awareness of safety;Decreased awareness of deficits            Exercises      General Comments General comments (skin integrity, edema, etc.): Wife present and engaged in session      Pertinent Vitals/Pain Pain Assessment: No/denies pain Pain Intervention(s): Monitored during session    Home Living                      Prior Function            PT Goals (current goals can now be found in the care plan section) Acute Rehab PT Goals Patient Stated Goal: Return home PT Goal Formulation: With  patient Time For Goal Achievement: 08/18/17 Potential to Achieve Goals: Good Progress towards PT goals: Progressing toward goals    Frequency    Min 3X/week      PT Plan Current plan remains appropriate    Co-evaluation              AM-PAC PT "6 Clicks" Daily Activity  Outcome Measure  Difficulty turning over in bed (including adjusting bedclothes, sheets and blankets)?: None Difficulty moving from lying on back to sitting on the side of the bed? : A Little Difficulty  sitting down on and standing up from a chair with arms (e.g., wheelchair, bedside commode, etc,.)?: A Little Help needed moving to and from a bed to chair (including a wheelchair)?: A Little Help needed walking in hospital room?: A Little Help needed climbing 3-5 steps with a railing? : Total 6 Click Score: 17    End of Session Equipment Utilized During Treatment: Gait belt Activity Tolerance: Patient tolerated treatment well;Patient limited by fatigue Patient left: with family/visitor present(using bathroom with assist from wife) Nurse Communication: Mobility status PT Visit Diagnosis: Other abnormalities of gait and mobility (R26.89)     Time: 6967-8938 PT Time Calculation (min) (ACUTE ONLY): 14 min  Charges:  $Gait Training: 8-22 mins                    G Codes:      Mabeline Caras, PT, DPT Acute Rehab Services  Pager: [place] 08/08/2017, 11:55 AM

## 2017-08-08 NOTE — Progress Notes (Signed)
Patient has home CPAP unit and needs no assistance with placing it on himself tonight.

## 2017-08-08 NOTE — Progress Notes (Signed)
Patient ID: Jose [name], male   DOB: 1950/12/01, 66 y.o.   MRN: [id number] S:Feels better no complaints O:BP (!) 125/47 (BP Location: Right Arm)   Pulse 73   Temp 98.7 F (37.1 C) (Oral)   Resp 18   Ht 6\' 1"  (1.854 m)   Wt 131.9 kg (290 lb 12.6 oz)   SpO2 97%   BMI 38.36 kg/m   Intake/Output Summary (Last 24 hours) at 08/08/2017 1434 Last data filed at 08/08/2017 1300 Gross per 24 hour  Intake 720 ml  Output 300 ml  Net 420 ml   Intake/Output: I/O last 3 completed shifts: In: 480 [P.O.:480] Out: -   Intake/Output this shift:  Total I/O In: 480 [P.O.:480] Out: 300 [Urine:300] Weight change: 1.7 kg (3 lb 12 oz) Gen:NAD CVS: no rub Resp:cta KDT:[id number] Ext: 1+ edema  Recent Labs  Lab 08/02/17 0223 08/03/17 0316 08/04/17 0253 08/05/17 0127 08/06/17 0218 08/07/17 0228 08/08/17 0220  NA 120* 123* 125* 126* 128* 128* 130*  K 4.9 4.1 4.2 4.0 4.7 4.0 4.2  CL 84* 79* 81* 81* 82* 86* 87*  CO2 20* 30 32 34* 34* 32 33*  GLUCOSE 113* 106* 97 152* 150* 167* 159*  BUN 71* 66* 62* 53* 47* 45* 39*  CREATININE 2.36* 2.17* 1.98* 1.92* 1.93* 1.75* 1.73*  ALBUMIN  --  2.4* 2.5* 2.4* 2.7* 2.5* 2.7*  CALCIUM 8.1* 8.2* 8.4* 8.3* 8.8* 8.5* 8.6*  PHOS  --  4.7* 4.2 3.7 3.5 3.4 3.4   Liver Function Tests: Recent Labs  Lab 08/06/17 0218 08/07/17 0228 08/08/17 0220  ALBUMIN 2.7* 2.5* 2.7*   No results for input(s): LIPASE, AMYLASE in the last 168 hours. No results for input(s): AMMONIA in the last 168 hours. CBC: Recent Labs  Lab 08/04/17 0253  WBC 6.2  HGB 9.4*  HCT 28.5*  MCV 84.8  PLT 336   Cardiac Enzymes: No results for input(s): CKTOTAL, CKMB, CKMBINDEX, TROPONINI in the last 168 hours. CBG: Recent Labs  Lab 08/07/17 1108 08/07/17 1645 08/07/17 2130 08/08/17 0625 08/08/17 1141  GLUCAP 257* 164* 183* 146* 208*    Iron Studies: No results for input(s): IRON, TIBC, TRANSFERRIN, FERRITIN in the last 72 hours. Studies/Results: No results found. [name] Kitchen apixaban   5 mg Oral BID  . clopidogrel  75 mg Oral Daily  . ezetimibe  10 mg Oral Daily  . ferrous sulfate  325 mg Oral Q breakfast  . furosemide  40 mg Oral BID  . Influenza vac split quadrivalent PF  0.5 mL Intramuscular Tomorrow-1000  . insulin aspart  0-5 Units Subcutaneous QHS  . insulin aspart  0-9 Units Subcutaneous TID WC  . insulin aspart  8 Units Subcutaneous TID WC  . insulin glargine  40 Units Subcutaneous BID  . isosorbide mononitrate  15 mg Oral Daily  . magnesium oxide  400 mg Oral Daily  . metoprolol succinate  12.5 mg Oral Daily  . omega-3 acid ethyl esters  2 g Oral Q breakfast  . omega-3 acid ethyl esters  3 g Oral BID AC  . polyethylene glycol  17 g Oral BID  . pravastatin  20 mg Oral q1800  . sertraline  100 mg Oral Daily  . tiotropium  18 mcg Inhalation QHS    BMET    Component Value Date/Time   NA 130 (L) 08/08/2017 0220   K 4.2 08/08/2017 0220   CL 87 (L) 08/08/2017 0220   CO2 33 (H) 08/08/2017 0220   GLUCOSE 159 (  H) 08/08/2017 0220   BUN 39 (H) 08/08/2017 0220   CREATININE 1.73 (H) 08/08/2017 0220   CREATININE 0.96 05/07/2016 1425   CALCIUM 8.6 (L) 08/08/2017 0220   GFRNONAA 39 (L) 08/08/2017 0220   GFRAA 46 (L) 08/08/2017 0220   CBC    Component Value Date/Time   WBC 6.2 08/04/2017 0253   RBC 3.36 (L) 08/04/2017 0253   HGB 9.4 (L) 08/04/2017 0253   HCT 28.5 (L) 08/04/2017 0253   PLT 336 08/04/2017 0253   MCV 84.8 08/04/2017 0253   MCH 28.0 08/04/2017 0253   MCHC 33.0 08/04/2017 0253   RDW 14.6 08/04/2017 0253   LYMPHSABS 1.2 07/20/2017 0602   MONOABS 0.8 07/20/2017 0602   EOSABS 0.3 07/20/2017 0602   BASOSABS 0.0 07/20/2017 0602     Assessment/Plan:  1. AKI/CKD following STEMI complicated by volume overload.  Improving again and likely hemodynamically mediated.  Slowly heading back toward baseline of 1.2.   2. Hyponatremia- improving as well 3. CAD STEMI/CHF- unsuccessful PCI to distal LAD, plan per Cardiology.  4. Anemia- follow H/H and  transfuse prn 5. A fib- on eliquis, rate controlled with toprol  Donetta Potts, MD [facility] [phone]

## 2017-08-08 NOTE — Progress Notes (Signed)
Progress Note  Patient Name: Jose [name] Date of Encounter: 08/08/2017  Primary Cardiologist: Dr. Harl Bowie  Subjective   Denies any chest pain or SOB. Has been ambulating to the door, but has not walked in the hallway.   Inpatient Medications    Scheduled Meds: . apixaban  5 mg Oral BID  . clopidogrel  75 mg Oral Daily  . ezetimibe  10 mg Oral Daily  . ferrous sulfate  325 mg Oral Q breakfast  . furosemide  40 mg Oral BID  . Influenza vac split quadrivalent PF  0.5 mL Intramuscular Tomorrow-1000  . insulin aspart  0-5 Units Subcutaneous QHS  . insulin aspart  0-9 Units Subcutaneous TID WC  . insulin aspart  8 Units Subcutaneous TID WC  . insulin glargine  40 Units Subcutaneous BID  . isosorbide mononitrate  15 mg Oral Daily  . magnesium oxide  400 mg Oral Daily  . metoprolol succinate  12.5 mg Oral Daily  . omega-3 acid ethyl esters  2 g Oral Q breakfast  . omega-3 acid ethyl esters  3 g Oral BID AC  . polyethylene glycol  17 g Oral BID  . pravastatin  20 mg Oral q1800  . sertraline  100 mg Oral Daily  . tiotropium  18 mcg Inhalation QHS   Continuous Infusions:  PRN Meds: acetaminophen, albuterol, lidocaine, lidocaine, lidocaine-EPINEPHrine, nitroGLYCERIN, ondansetron (ZOFRAN) IV, oxyCODONE-acetaminophen, oxymetazoline, silver nitrate applicators, TRIPLE ANTIBIOTIC   Vital Signs    Vitals:   08/07/17 1925 08/07/17 2146 08/07/17 2310 08/08/17 0450  BP: 120/64   135/64  Pulse:    (!) 123  Resp: 14  10 18   Temp:    (!) 97.4 F (36.3 C)  TempSrc:    Axillary  SpO2:  93%  95%  Weight:    290 lb 12.6 oz (131.9 kg)  Height:        Intake/Output Summary (Last 24 hours) at 08/08/2017 0938 Last data filed at 08/08/2017 0900 Gross per 24 hour  Intake 720 ml  Output -  Net 720 ml   Filed Weights   08/06/17 0529 08/07/17 0610 08/08/17 0450  Weight: 287 lb 0.6 oz (130.2 kg) 287 lb 0.6 oz (130.2 kg) 290 lb 12.6 oz (131.9 kg)    Telemetry    NSR without  significant ventricular ectopy - Personally Reviewed  ECG    Last EKG 07/16/2017, inferior STEMI, sinus rhythm - Personally Reviewed  Physical Exam   GEN: No acute distress.   Neck: No JVD Cardiac: RRR, no murmurs, rubs, or gallops.  Respiratory: Clear to auscultation bilaterally. GI: Soft, nontender, non-distended  MS: No deformity. 2-3+ pitting edema, worse on the left compare to the right Neuro:  Nonfocal  Psych: Normal affect   Labs    Chemistry Recent Labs  Lab 08/06/17 0218 08/07/17 0228 08/08/17 0220  NA 128* 128* 130*  K 4.7 4.0 4.2  CL 82* 86* 87*  CO2 34* 32 33*  GLUCOSE 150* 167* 159*  BUN 47* 45* 39*  CREATININE 1.93* 1.75* 1.73*  CALCIUM 8.8* 8.5* 8.6*  ALBUMIN 2.7* 2.5* 2.7*  GFRNONAA 35* 39* 39*  GFRAA 40* 45* 46*  ANIONGAP 12 10 10      Hematology Recent Labs  Lab 08/04/17 0253  WBC 6.2  RBC 3.36*  HGB 9.4*  HCT 28.5*  MCV 84.8  MCH 28.0  MCHC 33.0  RDW 14.6  PLT 336    Cardiac EnzymesNo results for input(s): TROPONINI in the last 168  hours. No results for input(s): TROPIPOC in the last 168 hours.   BNPNo results for input(s): BNP, PROBNP in the last 168 hours.   DDimer No results for input(s): DDIMER in the last 168 hours.   Radiology    No results found.  Cardiac Studies   Cardiac cath July 14, 2017 -unsuccessful attempted PTCA of occluded distal LAD, chronic occlusion of RCA Diagnostic Diagram       Post-Intervention Diagram        Echo July 14, 2017 - Left ventricle: LVEF is approximately 40% with akinesis of the distal inferoseptal wall and apex. Aneurysmal dilatation of apex. Consider limited echo with contrast to evaluate for thrombus. The cavity size was normal. Wall thickness was increased in a pattern of mild LVH. - Mitral valve: Calcified annulus. Mildly thickened leaflets     Patient Profile     66 y.o. male with PMH of CAD (previous cath 12/15 with unsuccessful PCI of RCA), HTN,  HLD, COPD, OSA on CPAP and CVA who presented on 07/14/2017 with extensive anteroapical and inferior infarction. He had unsuccessful attempt at revascularization of occluded distal LAD, chronic occlusion ofRCA. Moderate reduction in EF. He has atrial fibrillation with RVR on arrival, placed on eliquis, controlled with amiodarone. He also developed neurological symptoms with positive acute infarct on MRI and rising Cr requiring nephrology consult.    Assessment & Plan    1. CAD s/p unsuccessful PCI to distal LAD  - Echo 06/2017 EF 40%, apical hypokinesis  - continue plavix (not on asa since on eliquis)    - continue plavix, zetia, toprol XL, pravachol  - depend on how he is doing and weather condition, may consider discharge tomorrow.  2. Acute on chronic systolic HF: lung is clear, although continue to have 2-3+ pitting edema in the lower extremity, worse on the left.   - he likely will always have some degree of edema in the leg, recommend continue on lasix PO 40mg  BID with instruction of additional 40mg  lasix on a as needed basis. He is aware he need to cut back on salt.  - I am in favor to conservative therapy at this point for his leg swelling on top of his lasix instead of increasing lasix dose. Will try compression stocking and leg elevation first.    3. Atrial fibrillation: on eliquis and metoprolol. Maintaining sinus rhythm on telemetry   3. Acute infarction in the left parietal area  - MRI 07/28/2017 showed subcentimeter acute infarction in L parietal periventricular white matter  - MRA 07/29/2017 showed interval occlusion of L internal carotid artery with absence of antegrade flow at the skullbase. Approx 70% stenosis of the R vertebral artery at the foramen magnum level  4. Acute on chronic renal insufficiency  - though to be due to relative hypotension.   - arrived with Cr 1.5, Cr peaked at 2.25, now improving to 1.73  5. Uncontrolled HLD: last lipid panel 07/15/2017 chol 264,  trig 255, HDL 37, LDL 176. Cannot tolerate lipitor and zocor. Doubt zetia and pravachol will bring him to goal, consider lipid clinic referral for PCSK 9 inhibitor.  6. Deconditioning: PT    For questions or updates, please contact [place] Please consult [email or web address] for contact info under Cardiology/STEMI.      Hilbert Corrigan, PA  08/08/2017, 9:38 AM     Patient seen and examined. Agree with assessment and plan. Feels better. No chest pain. Sinus rhythm in the 70s. I/O -4722. 2+ edema above  the sock line bilaterally on furosemide; recommend compression stockings.  Discussed changing pravastatin to rosuvastatin.  Pt agrees for a trial.  Consider PCSK9 inhibition if cannot get LDL < 70. Renal function continues to improve; Cr today 1.73.  ? DC tomorrow.    Troy Sine, MD, [facility] 08/08/2017 10:22 AM

## 2017-08-09 DIAGNOSIS — R600 Localized edema: Secondary | ICD-10-CM

## 2017-08-09 LAB — RENAL FUNCTION PANEL
Albumin: 2.8 g/dL — ABNORMAL LOW (ref 3.5–5.0)
Anion gap: 11 (ref 5–15)
BUN: 36 mg/dL — AB (ref 6–20)
CHLORIDE: 88 mmol/L — AB (ref 101–111)
CO2: 30 mmol/L (ref 22–32)
CREATININE: 1.59 mg/dL — AB (ref 0.61–1.24)
Calcium: 8.7 mg/dL — ABNORMAL LOW (ref 8.9–10.3)
GFR calc non Af Amer: 44 mL/min — ABNORMAL LOW (ref >60)
GFR, EST AFRICAN AMERICAN: 51 mL/min — AB (ref >60)
GLUCOSE: 136 mg/dL — AB (ref 65–99)
Phosphorus: 3.1 mg/dL (ref 2.5–4.6)
Potassium: 4.4 mmol/L (ref 3.5–5.1)
SODIUM: 129 mmol/L — AB (ref 135–145)

## 2017-08-09 LAB — GLUCOSE, CAPILLARY
Glucose-Capillary: 186 mg/dL — ABNORMAL HIGH (ref 65–99)
Glucose-Capillary: 221 mg/dL — ABNORMAL HIGH (ref 65–99)

## 2017-08-09 MED ORDER — APIXABAN 5 MG PO TABS: 5.0000 mg | ORAL_TABLET | Freq: Two times a day (BID) | ORAL | 11 refills | Status: DC

## 2017-08-09 MED ORDER — ISOSORBIDE MONONITRATE ER 30 MG PO TB24: 15.0000 mg | ORAL_TABLET | Freq: Every day | ORAL | 1 refills | Status: DC

## 2017-08-09 MED ORDER — ROSUVASTATIN CALCIUM 10 MG PO TABS: 10.0000 mg | ORAL_TABLET | Freq: Every day | ORAL | 0 refills | Status: DC

## 2017-08-09 MED ORDER — EZETIMIBE 10 MG PO TABS: 10.0000 mg | ORAL_TABLET | Freq: Every day | ORAL | 11 refills | Status: AC

## 2017-08-09 MED ORDER — EZETIMIBE 10 MG PO TABS: 10.0000 mg | ORAL_TABLET | Freq: Every day | ORAL | 0 refills | Status: DC

## 2017-08-09 MED ORDER — METOPROLOL SUCCINATE ER 25 MG PO TB24: 12.5000 mg | ORAL_TABLET | Freq: Every day | ORAL | 3 refills | Status: DC

## 2017-08-09 MED ORDER — APIXABAN 5 MG PO TABS: 5.0000 mg | ORAL_TABLET | Freq: Two times a day (BID) | ORAL | 0 refills | Status: DC

## 2017-08-09 MED ORDER — METOPROLOL SUCCINATE ER 25 MG PO TB24: 12.5000 mg | ORAL_TABLET | Freq: Every day | ORAL | 0 refills | Status: DC

## 2017-08-09 MED ORDER — ISOSORBIDE MONONITRATE ER 30 MG PO TB24: 15.0000 mg | ORAL_TABLET | Freq: Every day | ORAL | 0 refills | Status: DC

## 2017-08-09 MED ORDER — ROSUVASTATIN CALCIUM 10 MG PO TABS: 10.0000 mg | ORAL_TABLET | Freq: Every day | ORAL | Status: DC

## 2017-08-09 MED ORDER — ROSUVASTATIN CALCIUM 10 MG PO TABS: 10.0000 mg | ORAL_TABLET | Freq: Every day | ORAL | 3 refills | Status: DC

## 2017-08-09 NOTE — Care Management Note (Signed)
original note created by Tomi Bamberger RN CCM  Case Management Note  Patient Details  Name: Jose [name] MRN: [id number] Date of Birth: [birth year]  Subjective/Objective:  From home with wife, s/p heart cath, unsuccessful pci, will txt medically, pta indep, he is a English as a second language teacher with [employer], he gets his medications from [place].  His PCP with the [facility] is [facility].   Fax number is [phone].  Wife is interested in getting a w/chair for patient.  NCM will need script to fax to [place] faxed w/chair order to Dr. Anda Latina today at 1604.     11/20 [place], BSN - volume overload, unsuccessful pci, consult for CVTS, but is poor candidate with high risk of lv apical thrombus. conts on lasix and hep drip.              Action/Plan: NCM will follow for dc needs.   Additional Comments: 07/19/2017 3:55 pm NCM spoke to pt and son, Jose [name] at bedside. Offered choice for HH/list provided. Son states he will let pt's wife/mother review. Pt has a bariatric RW at home. Pt may need oxygen. Will need walking oxygen sat. Will continue to follow for dc needs.  Jonnie Finner RN CCM Case Mgmt phone [phone]  Kristen Cardinal, RN 08/09/2017, 11:18 AM  Action/Plan:   Expected Discharge Date:  08/09/17               Expected Discharge Plan:  [place]  In-House Referral:  NA  Discharge planning Services  CM Consult  Post Acute Care Choice:  Home Health Choice offered to:  Spouse, Patient  DME Arranged:  Bedside commode, Hospital bed, Wheelchair manual DME Agency:  [place] Arranged:  RN, OT, PT [facility] Agency:  [place]  Status of Service:  Completed, signed off  If discussed at [place] of Stay Meetings, dates discussed:    Additional Comments: 08/09/17 10:45 Pt for discharge today. Orders placed for DME/HH.  In to speak with Pt and wife at bedside, offered choice for [facility].  Pt selected [facility].   Discussed recommendations made for OT/PT/RN, wheelchair, bedside commode and hospital bed. DME/HH referral called to [facility] representative Butch Penny.  Eliquis 30 day free card provided to use on discharge. Pt local pharmacy is [facility] in [place].  [facility].  Does not have Medicare Part D at this time.  PA to call [facility] with new scripts at [phone].  CM will forward discharge summary to PT [facility] at fax [phone].  Kristen Cardinal, RN 08/09/2017, 11:18 AM

## 2017-08-09 NOTE — Progress Notes (Addendum)
Physical Therapy Treatment Patient Details Name: Jose [name] MRN: [id number] DOB: [birth year] Today's Date: 08/09/2017    History of Present Illness Pt is a 66 y.o. male who presented to [facility] on 07/14/17 with chest pain and dyspnea. On arrival to [facility] ED, his EKG showed new onset A-fib with ST elevation in the inferolateral leads; CODE STEMI was called. Cardiac cath 11/15 with unsuccessful attempt at revascularization of occluded distal LAD, chronic occlusion of RCA. MRI on 07/28/17 revealed Subcentimeter acute infarction in left parietal periventricular white matter. Pertinent PMH includes CAD (CTO of RCA, with residual disease in LAD/Lcx), COPD, HL, HTN, obesity,DMand tobacco use.     PT Comments    Patient motivated to work with PT this morning. Nursing and wife reports patient able to ambulate to restroom with wife with no issue prior to PT. Patient ambulating in hallway for ~75 feet with Min Guard for general safety. Continued limitations due to fatigue with stand rest break required. Discussion with patient and wife regarding d/c disposition with both desiring to return home with [facility] therapies rather than SNF at this time. Will continue to follow acutely to maximize functional mobility prior to d/c.    Patient suffers from above stated dx which impairs their ability to perform daily activities like bathing, dressing, walking in the home.  A walker alone will not resolve the issues with performing activities of daily living. A wheelchair will allow patient to safely perform daily activities.  The patient can self propel in the home or has a caregiver who can provide assistance.   Barbarann Ehlers Krystiana Fornes, PT, DPT [phone]     Follow Up Recommendations  Home health PT;Supervision for mobility/OOB     Equipment Recommendations  WC, WC chushion    Recommendations for Other Services       Precautions / Restrictions Precautions Precautions: Fall Restrictions Weight Bearing  Restrictions: No    Mobility  Bed Mobility Overal bed mobility: Needs Assistance Bed Mobility: Supine to Sit     Supine to sit: Supervision        Transfers Overall transfer level: Needs assistance Equipment used: Rolling walker (2 wheeled) Transfers: Sit to/from Stand Sit to Stand: Min guard         General transfer comment: VC to have at least one hand on seated surface to reduce pulling on RW for sit to stand  Ambulation/Gait Ambulation/Gait assistance: Min guard Ambulation Distance (Feet): 75 Feet Assistive device: Rolling walker (2 wheeled) Gait Pattern/deviations: Step-through pattern;Trunk flexed Gait velocity: Decreased   General Gait Details: VC to remain close to RW for general safety, VC to be aware of objects in path to reduce tripping hazard; 1 standing rest break with patient leaning over RW - O2 remained at 92% on RA   Stairs            Wheelchair Mobility    Modified Rankin (Stroke Patients Only)       Balance Overall balance assessment: Needs assistance Sitting-balance support: Feet supported Sitting balance-Leahy Scale: Good     Standing balance support: Bilateral upper extremity supported;During functional activity Standing balance-Leahy Scale: Poor                              Cognition Arousal/Alertness: Awake/alert Behavior During Therapy: WFL for tasks assessed/performed Overall Cognitive Status: Impaired/Different from baseline Area of Impairment: Safety/judgement  Safety/Judgement: Decreased awareness of safety     General Comments: quick to transfer/perform activities with VC to slow movements      Exercises      General Comments General comments (skin integrity, edema, etc.): wife present during entirety of session      Pertinent Vitals/Pain Pain Assessment: No/denies pain Pain Location: does report low back soreness with ambulation    Home Living                       Prior Function            PT Goals (current goals can now be found in the care plan section) Acute Rehab PT Goals Patient Stated Goal: Return home PT Goal Formulation: With patient Time For Goal Achievement: 08/18/17 Potential to Achieve Goals: Good Progress towards PT goals: Progressing toward goals    Frequency    Min 3X/week      PT Plan Discharge plan needs to be updated    Co-evaluation              AM-PAC PT "6 Clicks" Daily Activity  Outcome Measure  Difficulty turning over in bed (including adjusting bedclothes, sheets and blankets)?: None Difficulty moving from lying on back to sitting on the side of the bed? : A Little Difficulty sitting down on and standing up from a chair with arms (e.g., wheelchair, bedside commode, etc,.)?: A Little Help needed moving to and from a bed to chair (including a wheelchair)?: A Little Help needed walking in hospital room?: A Little Help needed climbing 3-5 steps with a railing? : A Lot 6 Click Score: 18    End of Session Equipment Utilized During Treatment: Gait belt Activity Tolerance: Patient tolerated treatment well;Patient limited by fatigue Patient left: in chair;with family/visitor present Nurse Communication: Mobility status PT Visit Diagnosis: Other abnormalities of gait and mobility (R26.89)     Time: 4128-7867 PT Time Calculation (min) (ACUTE ONLY): 12 min  Charges:  $Gait Training: 8-22 mins                    Lanney Gins, PT, DPT 08/09/17 10:13 AM

## 2017-08-09 NOTE — Progress Notes (Signed)
CSW was following patients for discharge needs and assistance with transitioning to SNF. At this time PT is recommending Home Health and patient/family is wanting to go home. PA and RNCM for unit is aware of recommendation. CSW signing off as patient no longer needs social work intervention.   Rhea Pink, MSW,  [place]

## 2017-08-09 NOTE — Progress Notes (Deleted)
Discharge Summary    Patient ID: Jose [name],  MRN: [id number], DOB/AGE: Apr 18, 1951 66 y.o.  Admit date: 07/14/2017 Discharge date: 08/09/2017  Primary Care Provider: Cleophas Dunker Primary Cardiologist: Dr. Harl Bowie  Discharge Diagnoses    Principal Problem:   STEMI (ST elevation myocardial infarction) [facility]) Active Problems:   COPD (chronic obstructive pulmonary disease) (HCC)   HTN (hypertension)   Diabetes ([place])   Morbid obesity (HCC)   OSA (obstructive sleep apnea)   Chronic diastolic CHF (congestive heart failure) (HCC)   Atrial fibrillation with rapid ventricular response (HCC)   STEMI involving left anterior descending coronary artery (HCC)   Acute on chronic systolic CHF (congestive heart failure), NYHA class 3 (HCC)   Coronary artery disease involving native coronary artery of native heart with unstable angina pectoris (HCC)   PAF (paroxysmal atrial fibrillation) (HCC)   CRI (chronic renal insufficiency), stage 3 (moderate) ([place])   Lacunar infarction   Carotid occlusion, left   Hyponatremia   Allergies Allergies  Allergen Reactions  . Atorvastatin     Tremors  . Simvastatin     Muscle weakness    Diagnostic Studies/Procedures    Cath 07/14/2017 Conclusion     Dist LM to Ost LAD lesion is 70% stenosed.  Prox LAD to Mid LAD lesion is 30% stenosed.  Mid LAD to Dist LAD lesion is 100% stenosed.  Ost Cx lesion is 70% stenosed.  Prox RCA lesion is 100% stenosed.  LV end diastolic pressure is moderately elevated.  Balloon angioplasty was performed using a BALLOON EUPHORA VO1.60V37.  Post intervention, there is a 100% residual stenosis.   1. 3 vessel obstructive CAD.    - culprit lesion is occlusion of distal LAD. Unsuccessful attempt at PTCA- unable to restore flow despite balloon angioplasty.    - 70% ostial LAD with concentric calcified plaque. Minimal lumen area 5.2 mm squared by IVUS    - approximately 70% ostial LCx disease. Unable to  cross with IVUS catheter.    - CTO of the proximal RCA with good left to right collaterals 2. Moderately elevated LVEDP 3. Persistent Afib   Plan: aggressive medical management. Assess LV functio by Echo. It is unclear the significance of the ostial LAD and LCx. If he has refractory angina I would consider referral for CABG. If symptoms improve I would consider ischemic work up with a stress myoview in the near future to assess need for further revascularization.     Echo 07/14/2017 - Left ventricle: LVEF is approximately 40% with akinesis of the   distal inferoseptal wall and apex. Aneurysmal dilatation of apex.   Consider limited echo with contrast to evaluate for thrombus. The   cavity size was normal. Wall thickness was increased in a pattern   of mild LVH. - Mitral valve: Calcified annulus. Mildly thickened leaflets .     MRI of brain 07/28/2017 IMPRESSION: 1. Diffusion and axial T2 FLAIR sequences were acquired. 2. Subcentimeter acute infarction in left parietal periventricular white matter. No mass effect or gross hemorrhage. 3. Stable chronic microvascular ischemic changes and parenchymal volume loss of the brain.    MRA of Head 07/29/2017 IMPRESSION: Interval occlusion of the left internal carotid artery with absence of antegrade flow at the skullbase. Diminished but present flow in the anterior circulation on the left probably secondary to patent anterior and posterior communicating arteries.  Stenosis of the A1 segment on the right, additional importance based on the relevance to collateral flow to the left  hemisphere.  Approximately 70% stenosis of the right vertebral artery at the foramen magnum level. Antegrade flow does persist. Dominant left vertebral artery freely supplies the basilar. _____________   History of Present Illness     Jose [name] is a 66 yo male with PMH of  CAD (CTO of RCA, with residual disease in LAD/Lcx), COPD, HL, HTN, obesity, DM and  tobacco use. Underwent cardiac cath in 2015 with Dr. Terrence Dupont that showed a CTO of RCA, with residual disease in the LAD, and Lcx. Unsuccessful attempt at the RCA. He has been on DAPt with ASA/plavix since that time. Was last seen in the office by Dr. Harl Bowie on 10/17 and reported begin in his usual state of health. Last echo 6/17 with normal EF and G1DD.   He presented to [facility] on 07/14/17 with chest pain and dyspnea. Reports this started about 3 days ago, and has be intermittent. He is on chronic home o2 but has been more short of breath with activity. This morning his wife became concerned about his symptoms and called EMS. Was given 324 ASA. Reported to be in Afib on EMS.   On arrival to [facility] his EKG showed new onset Afib with ST elevation in the inferolateral leads. CODE STEMI was called. He was given 4000 units of heparin, IV Dilt 10 x1, then 15mg  x1 and remained on Dilt gtt. Brought directly to the cath lab for emergent cardiac cath. Troponin on arrival 20.79, Cr 1.5, Na+ 130, Hgb 12.6.    Hospital Course     Consultants: Wound Care, Hospitalist Dr. Nevada Crane, Neurology Dr. Rory Percy, ENT Dr. Constance Holster  Patient underwent emergent cardiac catheterization on 07/14/2017 which showed 70% distal left main, 100% mid to distal LAD occlusion, 70% ostial left circumflex, 100% proximal RCA.  The culprit lesion was felt to be distal LAD occlusion, however PTCA was unsuccessful.  Patient was also in persistent atrial fibrillation at that time.  Follow-up echocardiogram showed EF of 40% with akinesis of the distal inferoseptal wall and apex, aneurysmal dilatation of the apex.  He was placed on amiodarone and metoprolol for his atrial fibrillation.  Patient was eventually placed on aspirin, Plavix and Eliquis triple therapy, aspirin later discontinued given the need to prolonged plavix and eliquis.  Hospital course complicated by acute on chronic renal insufficiency and acute on chronic systolic heart failure.  He has  orthostasis while walking with PT. Due to intermittently altered mental status, neurology was consulted on 07/28/2017.  He also has electrolyte abnormalities including hyponatremia.  MRI of the brain obtained on the same day showed subcentimeter acute infarction in the left parietal periventricular white matter.  Interval occlusion of the left internal carotid artery with absence of antegrade flow at the skull base, 70% stenosis of the right vertebral artery at the foramen magnum level.  He had a swallow study on 07/30/2017, it was recommended that he has a ENT evaluation.  He was evaluated by ENT on 07/31/2017, there was no evidence of any infection or neoplasms in the larynx or pharynx, there was no evidence of aspiration.  Due to concern that amiodarone was contributing to the patient's weakness, this was stopped on 12/7.    Patient was seen on 08/09/2017, he was doing well from both neurological and a cardiac perspective.  We have switched him to Crestor for better control of LDL.  He will need a fasting lipid panel and LFTs in 6-8 weeks and CBC/BMET in 1-2 weeks.  Otherwise patient is doing very well  and considered stable for discharge.  Although physical therapy recommended skilled nursing facility, however family wished to go home with home health.  I have ordered home RN, PT, and OT. I have also ordered hospital bed and a bedside commode for the patient.  30 day supply of Rx given to local pharmacy. All Rx also fax to [place] [phone]   _____________  Discharge Vitals Blood pressure 127/65, pulse 74, temperature 97.9 F (36.6 C), temperature source Oral, resp. rate 20, height 6\' 1"  (1.854 m), weight 287 lb 9.6 oz (130.5 kg), SpO2 97 %.  Filed Weights   08/07/17 0610 08/08/17 0450 08/09/17 0441  Weight: 287 lb 0.6 oz (130.2 kg) 290 lb 12.6 oz (131.9 kg) 287 lb 9.6 oz (130.5 kg)    Labs & Radiologic Studies    CBC No results for input(s): WBC, NEUTROABS, HGB, HCT, MCV, PLT in the last 72  hours. Basic Metabolic Panel Recent Labs    08/08/17 0220 08/09/17 0441  NA 130* 129*  K 4.2 4.4  CL 87* 88*  CO2 33* 30  GLUCOSE 159* 136*  BUN 39* 36*  CREATININE 1.73* 1.59*  CALCIUM 8.6* 8.7*  PHOS 3.4 3.1   Liver Function Tests Recent Labs    08/08/17 0220 08/09/17 0441  ALBUMIN 2.7* 2.8*   No results for input(s): LIPASE, AMYLASE in the last 72 hours. Cardiac Enzymes No results for input(s): CKTOTAL, CKMB, CKMBINDEX, TROPONINI in the last 72 hours. BNP Invalid input(s): POCBNP D-Dimer No results for input(s): DDIMER in the last 72 hours. Hemoglobin A1C No results for input(s): HGBA1C in the last 72 hours. Fasting Lipid Panel No results for input(s): CHOL, HDL, LDLCALC, TRIG, CHOLHDL, LDLDIRECT in the last 72 hours. Thyroid Function Tests No results for input(s): TSH, T4TOTAL, T3FREE, THYROIDAB in the last 72 hours.  Invalid input(s): FREET3 _____________  Dg Chest 2 View  Result Date: 07/23/2017 CLINICAL DATA:  Pleuritic chest pain. EXAM: CHEST  2 VIEW COMPARISON:  07/17/2017 FINDINGS: Stable enlargement of the cardiac silhouette. Lungs are clear without airspace disease or pulmonary edema. Improved aeration in the right lower lung compared to the recent comparison. Trachea is midline. Negative for a pneumothorax. Bony thorax is intact. No large pleural effusions. Bridging osteophytes in the thoracic spine. IMPRESSION: No active cardiopulmonary disease. Stable cardiomegaly. Electronically Signed   By: Markus Daft M.D.   On: 07/23/2017 12:30   Mr Jodene Nam Head Wo Contrast  Result Date: 07/29/2017 CLINICAL DATA:  Acute presentation with weakness and shortness of breath. Atrial fibrillation. MRI yesterday demonstrated acute infarction in the left parietal periventricular white matter. EXAM: MRA HEAD WITHOUT CONTRAST TECHNIQUE: Angiographic images of the Circle of Willis were obtained using MRA technique without intravenous contrast. COMPARISON:  03/17/2016 FINDINGS:  Patient has developed occlusion of the left internal carotid artery in the neck with absence of antegrade flow through the skullbase and siphon region. Reconstituted flow is noted in the supraclinoid ICA with present but diminished flow in the left anterior and middle cerebral artery territories. There appears to be a patent anterior communicating artery and there may be a small patent posterior communicating artery. On the right, the internal carotid artery is patent through the skullbase and siphon region. There is fetal origin of the right PCA which appears widely patent. Anterior and middle cerebral arteries are patent. No MCA stenosis seen on the right. I think there is moderate stenosis of the A1 segment on the right. Both vertebral arteries are patent at the foramen magnum. Left  vertebral artery is dominant. There is focal stenosis of the right vertebral artery at foramen magnum, approximately 70%. Both posterior inferior cerebellar arteries show flow. No basilar stenosis. Posterior circulation branch vessels are patent. IMPRESSION: Interval occlusion of the left internal carotid artery with absence of antegrade flow at the skullbase. Diminished but present flow in the anterior circulation on the left probably secondary to patent anterior and posterior communicating arteries. Stenosis of the A1 segment on the right, additional importance based on the relevance to collateral flow to the left hemisphere. Approximately 70% stenosis of the right vertebral artery at the foramen magnum level. Antegrade flow does persist. Dominant left vertebral artery freely supplies the basilar. Electronically Signed   By: Nelson Chimes M.D.   On: 07/29/2017 11:34   Mr Brain Wo Contrast  Result Date: 07/28/2017 CLINICAL DATA:  66 y/o M; Focal neuro deficit, > 6 hrs, stroke suspected. EXAM: MRI HEAD WITHOUT CONTRAST TECHNIQUE: Axial DWI and axial T2 FLAIR propeller sequences were acquired. Patient was unable to continue the  examination and additional sequences were not acquired. COMPARISON:  03/17/2016 MRI of the head. FINDINGS: Subcentimeter focus of reduced diffusion in left parietal periventricular white matter (series 3, image 36). No mass effect or gross hemorrhage. Stable chronic microvascular ischemic changes and parenchymal volume loss of the brain. No abnormal extra-axial FLAIR signal to suggest hemorrhage. No herniation or effacement of basilar cisterns. Small right maxillary sinus mucous retention cyst. IMPRESSION: 1. Diffusion and axial T2 FLAIR sequences were acquired. 2. Subcentimeter acute infarction in left parietal periventricular white matter. No mass effect or gross hemorrhage. 3. Stable chronic microvascular ischemic changes and parenchymal volume loss of the brain. These results will be called to the ordering clinician or representative by the Radiologist Assistant, and communication documented in the PACS or zVision Dashboard. Electronically Signed   By: Kristine Garbe M.D.   On: 07/28/2017 22:36   US Renal  Result Date: 07/15/2017 CLINICAL DATA:  Initial evaluation for acute kidney injury. History of diabetes, hypertension. Obesity. EXAM: RENAL / URINARY TRACT ULTRASOUND COMPLETE COMPARISON:  None. FINDINGS: Right Kidney: Length: 13.5 cm. Right kidney demonstrates a somewhat lobulated contour. Diffusely increased echogenicity, suggesting medical renal disease. No mass or hydronephrosis visualized. Trace free perinephric fluid noted. Left Kidney: Length: 14.3 cm. Left kidney demonstrates a lobulated contour. Diffusely increased echogenicity, suggesting medical renal disease. No mass or hydronephrosis visualized. Bladder: Appears normal for degree of bladder distention. IMPRESSION: Diffusely increased echogenicity, suggesting medical renal disease. No hydronephrosis. Electronically Signed   By: Jeannine Boga M.D.   On: 07/15/2017 21:23   Dg Chest Port 1 View  Result Date:  07/17/2017 CLINICAL DATA:  Dyspnea EXAM: PORTABLE CHEST 1 VIEW COMPARISON:  Chest x-ray dated 03/17/2016. FINDINGS: Ill-defined opacity at the right lung base. Stable cardiomegaly. Mild bilateral interstitial prominence is stable, presumably chronic. IMPRESSION: 1. Ill-defined opacity at the right lung base, pneumonia versus asymmetric edema. If afebrile, favor asymmetric pulmonary edema. 2. Cardiomegaly with bilateral interstitial edema suggesting mild CHF. Appearance is similar to the previous study of 03/17/2016, compatible with a mild chronic CHF. Electronically Signed   By: Franki Cabot M.D.   On: 07/17/2017 10:44   Dg Swallowing Func-speech Pathology  Result Date: 07/30/2017 Objective Swallowing Evaluation: Type of Study: MBS-Modified Barium Swallow Study  Patient Details Name: Jose [name] MRN: [id number] Date of Birth: [birth year] Today's Date: 07/30/2017 Time: SLP Start Time (ACUTE ONLY): 1500 -SLP Stop Time (ACUTE ONLY): 1520 SLP Time Calculation (min) (ACUTE ONLY): 20  min Past Medical History: Past Medical History: Diagnosis Date . CAD (coronary artery disease)   a. prior silent inferior MI, NSTEMI 07/2014 with chronically occluded RCA s/p unsuccessful PCI with otherwise nonobstructive residual disease . Chronic respiratory failure ([place])   a. Placed on home O2 01/2016. [name] Kitchen COPD (chronic obstructive pulmonary disease) ([place])  . Diabetes mellitus ([place])  . Former tobacco use  . Hyperlipidemia  . Hypertension  . Morbid obesity ([place])  . Neuropathy  . Sleep apnea  Past Surgical History: Past Surgical History: Procedure Laterality Date . BACK SURGERY   . CORONARY BALLOON ANGIOPLASTY N/A 07/14/2017  Procedure: CORONARY BALLOON ANGIOPLASTY;  Surgeon: Martinique, Peter M, MD;  Location: [facility];  Service: Cardiovascular;  Laterality: N/A; . INTRAVASCULAR ULTRASOUND/IVUS N/A 07/14/2017  Procedure: Intravascular Ultrasound/IVUS;  Surgeon: Martinique, Peter M, MD;  Location: [facility];  Service:  Cardiovascular;  Laterality: N/A; . LEFT HEART CATH AND CORONARY ANGIOGRAPHY N/A 07/14/2017  Procedure: LEFT HEART CATH AND CORONARY ANGIOGRAPHY;  Surgeon: Martinique, Peter M, MD;  Location: [facility];  Service: Cardiovascular;  Laterality: N/A; . LEFT HEART CATHETERIZATION WITH CORONARY ANGIOGRAM N/A 08/28/2014  Procedure: LEFT HEART CATHETERIZATION WITH CORONARY ANGIOGRAM;  Surgeon: Clent Demark, MD;  Location: [facility];  Service: Cardiovascular;  Laterality: N/A; . PERIPHERAL VASCULAR CATHETERIZATION Bilateral 03/22/2016  Procedure: Carotid Angiography;  Surgeon: Elam Dutch, MD;  Location: [facility];  Service: Cardiovascular;  Laterality: Bilateral; HPI: Pt is a 66 y.o. male who presented [facility] on 07/14/17 with chest pain and dyspnea. On arrival to [facility], his EKG showed new onset A-fib with ST elevation in the inferolateral leads; CODE STEMI was called. Cardiac cath 11/15 with unsuccessful attempt at revascularization of occluded distal LAD, chronic occlusion of RCA. MRI on 07/28/17 revealed subcentimeter acute infarction in left parietal periventricular white matter Pertinent PMH includes CAD, COPD, HL, HTN, obesity,DMand tobacco use. BSE initiated due to neuro changes (onset dysarthria).  Subjective: Alert, cooperative Assessment / Plan / Recommendation CHL IP CLINICAL IMPRESSIONS 07/30/2017 Clinical Impression Patient presents with mild pharyngeal phase dysphagia due to structural anomaly, with prominent epiglottic tissue which appears to extend inferiorly. This impedes complete epiglottic deflection and laryngeal closure, resulting in intermittent transient penetration during the swallow with liquids. Mild-moderate residue (solids>liquids) in the valleculae which reduces with subsequent dry swallows, liquid wash. Recommend pt consume regular diet with thin liquids; SLP will follow briefly for tolerance and training in compensatory strategies. Pt would benefit from ENT consult to  further assess epiglottic tissue, particularly as pt has been complaining of odynophagia and has a history of tobacco abuse. SLP Visit Diagnosis Dysphagia, pharyngeal phase (R13.13) Attention and concentration deficit following -- Frontal lobe and executive function deficit following -- Impact on safety and function Mild aspiration risk   CHL IP TREATMENT RECOMMENDATION 07/30/2017 Treatment Recommendations Therapy as outlined in treatment plan below   Prognosis 07/30/2017 Prognosis for Safe Diet Advancement Good Barriers to Reach Goals -- Barriers/Prognosis Comment -- CHL IP DIET RECOMMENDATION 07/30/2017 SLP Diet Recommendations Regular solids;Thin liquid Liquid Administration via Cup;Straw Medication Administration Whole meds with liquid Compensations Follow solids with liquid;Clear throat intermittently;Multiple dry swallows after each bite/sip;Slow rate;Small sips/bites Postural Changes Seated upright at 90 degrees   CHL IP OTHER RECOMMENDATIONS 07/30/2017 Recommended Consults Consider ENT evaluation Oral Care Recommendations Oral care BID Other Recommendations --   CHL IP FOLLOW UP RECOMMENDATIONS 07/30/2017 Follow up Recommendations Other (comment)   CHL IP FREQUENCY AND DURATION  07/30/2017 Speech Therapy Frequency (ACUTE ONLY) min 1 x/week Treatment Duration 1 week      CHL IP ORAL PHASE 07/30/2017 Oral Phase WFL Oral - Pudding Teaspoon -- Oral - Pudding Cup -- Oral - Honey Teaspoon -- Oral - Honey Cup -- Oral - Nectar Teaspoon -- Oral - Nectar Cup -- Oral - Nectar Straw -- Oral - Thin Teaspoon -- Oral - Thin Cup -- Oral - Thin Straw -- Oral - Puree -- Oral - Mech Soft -- Oral - Regular -- Oral - Multi-Consistency -- Oral - Pill -- Oral Phase - Comment --  CHL IP PHARYNGEAL PHASE 07/30/2017 Pharyngeal Phase Impaired Pharyngeal- Pudding Teaspoon -- Pharyngeal -- Pharyngeal- Pudding Cup -- Pharyngeal -- Pharyngeal- Honey Teaspoon -- Pharyngeal -- Pharyngeal- Honey Cup -- Pharyngeal -- Pharyngeal- Nectar Teaspoon --  Pharyngeal -- Pharyngeal- Nectar Cup -- Pharyngeal -- Pharyngeal- Nectar Straw -- Pharyngeal -- Pharyngeal- Thin Teaspoon -- Pharyngeal -- Pharyngeal- Thin Cup Reduced epiglottic inversion;Reduced airway/laryngeal closure;Penetration/Aspiration during swallow;Pharyngeal residue - valleculae Pharyngeal Material enters airway, remains ABOVE vocal cords then ejected out Pharyngeal- Thin Straw Reduced epiglottic inversion;Reduced airway/laryngeal closure;Penetration/Aspiration during swallow;Pharyngeal residue - valleculae Pharyngeal Material enters airway, remains ABOVE vocal cords then ejected out Pharyngeal- Puree Reduced epiglottic inversion;Reduced airway/laryngeal closure;Pharyngeal residue - valleculae Pharyngeal -- Pharyngeal- Mechanical Soft -- Pharyngeal -- Pharyngeal- Regular Reduced epiglottic inversion;Reduced airway/laryngeal closure;Pharyngeal residue - valleculae Pharyngeal -- Pharyngeal- Multi-consistency -- Pharyngeal -- Pharyngeal- Pill Reduced epiglottic inversion;Reduced airway/laryngeal closure Pharyngeal -- Pharyngeal Comment --  CHL IP CERVICAL ESOPHAGEAL PHASE 07/30/2017 Cervical Esophageal Phase WFL Pudding Teaspoon -- Pudding Cup -- Honey Teaspoon -- Honey Cup -- Nectar Teaspoon -- Nectar Cup -- Nectar Straw -- Thin Teaspoon -- Thin Cup -- Thin Straw -- Puree -- Mechanical Soft -- Regular -- Multi-consistency -- Pill -- Cervical Esophageal Comment -- No flowsheet data found. Aliene Altes 07/30/2017, 3:52 PM Deneise Lever, MS, [place] Speech-Language Pathologist [phone]             Disposition   Pt is being discharged home today in good condition.  Follow-up Plans & Appointments     Contact information for follow-up providers    [facility] Follow up.   Why:  Hospital bed arranged to be delivered to home.  (Bedside commode and wheelchair to be delivered to room prior to discharge). Contact information: [place]  [address] [address], [facility] Follow [facility].   Specialty:  Home Health Services Why:  Physical therapy, Occupational therapy and RN arranged.  Will contact you to set up visits. Contact information: [place] [address] [phone]        Arnoldo Lenis, MD Follow up.   Specialty:  Cardiology Why:  our scheduler will contact you to arrange outpatient visit, please give [place] a call if you do not hear from [place] in 3 business days.  Contact information: [address] [place] [address] [phone]            Contact information for after-discharge care    [facility] Follow up.   Service:  Skilled Nursing Contact information: [address] [place] [phone]                 Discharge Instructions    AMB Referral to Cardiac Rehabilitation - Phase II   Complete by:  As directed  Diagnosis:  STEMI   Amb Referral to Cardiac Rehabilitation   Complete by:  As directed    Diagnosis:  STEMI   Diet - low sodium heart healthy   Complete by:  As directed    Increase activity slowly   Complete by:  As directed       Discharge Medications   Allergies as of 08/09/2017      Reactions   Atorvastatin    Tremors   Simvastatin    Muscle weakness      Medication List    STOP taking these medications   aspirin 81 MG tablet   metoprolol tartrate 25 MG tablet Commonly known as:  LOPRESSOR   pravastatin 20 MG tablet Commonly known as:  PRAVACHOL     TAKE these medications   apixaban 5 MG Tabs tablet Commonly known as:  ELIQUIS Take 1 tablet (5 mg total) by mouth 2 (two) times daily.   clopidogrel 75 MG tablet Commonly known as:  PLAVIX Take 1 tablet (75 mg total) by mouth daily.   ergocalciferol 50000 units capsule Commonly known as:  VITAMIN D2 Take 50,000 Units by mouth See admin instructions. Take one twice a week on wednesdays and  saturdays   ezetimibe 10 MG tablet Commonly known as:  ZETIA Take 1 tablet (10 mg total) by mouth daily. Start taking on:  08/10/2017   ferrous sulfate 325 (65 FE) MG tablet Take 325 mg by mouth 3 (three) times daily with meals.   Fish Oil 1000 MG Caps Take 2,000-3,000 mg See admin instructions by mouth. Take 2000 mg every morning Take 3000 mg at noon and evening.   furosemide 40 MG tablet Commonly known as:  LASIX Take 40 mg by mouth 2 (two) times daily.   glipiZIDE 5 MG tablet Commonly known as:  GLUCOTROL Take 5 mg 2 (two) times daily by mouth. 10mg  in the morning, 5mg  in the afternoon, 5mg  in the evening   ICAPS AREDS 2 PO Take 1 capsule by mouth 2 (two) times daily.   insulin aspart 100 UNIT/ML injection Commonly known as:  novoLOG Inject 30 Units into the skin 3 (three) times daily before meals.   insulin glargine 100 UNIT/ML injection Commonly known as:  LANTUS Inject 0.3-0.6 mLs (30-60 Units total) into the skin See admin instructions. 50 units in the AM and 60 units in the PM What changed:    how much to take  additional instructions   isosorbide mononitrate 30 MG 24 hr tablet Commonly known as:  IMDUR Take 0.5 tablets (15 mg total) by mouth daily. Start taking on:  08/10/2017   magnesium oxide 400 MG tablet Commonly known as:  MAG-OX Take 1 tablet (400 mg total) by mouth daily.   metFORMIN 1000 MG tablet Commonly known as:  GLUCOPHAGE Take 1 tablet (1,000 mg total) by mouth 2 (two) times daily with a meal.   metoprolol succinate 25 MG 24 hr tablet Commonly known as:  TOPROL-XL Take 0.5 tablets (12.5 mg total) by mouth daily. Start taking on:  08/10/2017   mupirocin cream 2 % Commonly known as:  BACTROBAN Apply topically daily. What changed:    when to take this  reasons to take this   nitroGLYCERIN 0.4 MG SL tablet Commonly known as:  NITROSTAT Place 1 tablet (0.4 mg total) under the tongue every 5 (five) minutes x 3 doses as needed for  chest pain.   oxyCODONE-acetaminophen 5-325 MG tablet Commonly known as:  PERCOCET/ROXICET Take 2 tablets by  mouth every 4 (four) hours. Take eight tablets daily as needed for pain per family and list. For bad  Back pain   rosuvastatin 10 MG tablet Commonly known as:  CRESTOR Take 1 tablet (10 mg total) by mouth daily at 6 PM.   sertraline 100 MG tablet Commonly known as:  ZOLOFT Take 100 mg by mouth daily.   tiotropium 18 MCG inhalation capsule Commonly known as:  SPIRIVA Place 18 mcg into inhaler and inhale at bedtime.   vitamin B-12 1000 MCG tablet Commonly known as:  CYANOCOBALAMIN Take 1,000 mcg by mouth daily.            Durable Medical Equipment  (From admission, onward)        Start     Ordered   08/09/17 1100  For home use only DME Hospital bed  Once    Question Answer Comment  Patient has (list medical condition): stroke, heart failure, myocardial infarction   Bed type Semi-electric      08/09/17 1104   08/09/17 1100  For home use only DME Bedside commode  Once    Question:  Patient needs a bedside commode to treat with the following condition  Answer:  Physical deconditioning   08/09/17 1104   07/15/17 1133  For home use only DME standard manual wheelchair with seat cushion  Once    Comments:  Patient suffers from weakness which impairs their ability to perform daily activities like walking in the home.  A walking aid  will not resolve  issue with performing activities of daily living. A wheelchair will allow patient to safely perform daily activities. Patient can safely propel the wheelchair in the home or has a caregiver who can provide assistance.  Accessories: elevating leg rests (ELRs), wheel locks, extensions and anti-tippers.  Pt is 295 lbs   07/15/17 1135       Aspirin prescribed at discharge?  No: given need to eliquis High Intensity Statin Prescribed? (Lipitor 40-80mg  or Crestor 20-40mg ): No: intolerant Beta Blocker Prescribed? Yes For EF  <40%, was ACEI/ARB Prescribed? No: acute renal insufficiency ADP Receptor Inhibitor Prescribed? (i.e. Plavix etc.-Includes Medically Managed Patients): Yes For EF <40%, Aldosterone Inhibitor Prescribed? No:  Was EF assessed during THIS hospitalization? Yes Was Cardiac Rehab II ordered? (Included Medically managed Patients): No: Physical therapy and OT   Outstanding Labs/Studies   FLP and LFT in 6-8 weeks. BMET and CBC in 1-2 weeks  Duration of Discharge Encounter   Greater than 30 minutes including physician time.  SignedAlmyra Deforest NP 08/09/2017, 12:41 PM

## 2017-08-09 NOTE — Progress Notes (Signed)
PHARMACIST STATIN AND CHOLESTEROL MONITORING  Jose [name] is a 66 y.o. male admitted on 07/14/2017 s/p STEMI on sub optimal statin therapy for the following indication:  [x]  Secondary prevention for clinical ASCVD []  Primary prevention for LDL > 190 mg/dL []  Primary prevention DM age 38-75 years with LDL 70-189 mg/dL []  Primary prevention, no diabetes, estimated 10 year ASVD risk > 7.5% and age 52-75 with LDL 70-189 mg/dL   Reasons that warrant discontinuation or lower statin intensity []  Documented hypersensitivity or allergy [x]  Documented muscle pain or rhabomyolysis []  Elevated LFTs or active liver disease []  Intolerance for other reasons: ___________ []  Patient preference []  No reason found  Dr. Claiborne Billings has changed to Crestor 10mg  po dailly (was on pravastatin PTA) . Higher doses being avoided due to concerns of intolerance.  Hildred Laser, Pharm D 08/09/2017 1:20 PM

## 2017-08-09 NOTE — Discharge Summary (Signed)
Discharge Summary    Patient ID: Jose [name],  MRN: [id number], DOB/AGE: Jul 05, 1951 66 y.o.  Admit date: 07/14/2017 Discharge date: 08/09/2017  Primary Care Provider: Cleophas [name] Primary Cardiologist: Jose [name]  Discharge Diagnoses    Principal Problem:   STEMI (ST elevation myocardial infarction) [facility]) Active Problems:   COPD (chronic obstructive pulmonary disease) (HCC)   HTN (hypertension)   Diabetes ([place])   Morbid obesity (HCC)   OSA (obstructive sleep apnea)   Chronic diastolic CHF (congestive heart failure) (HCC)   Atrial fibrillation with rapid ventricular response (HCC)   STEMI involving left anterior descending coronary artery (HCC)   Acute on chronic systolic CHF (congestive heart failure), NYHA class 3 (HCC)   Coronary artery disease involving native coronary artery of native heart with unstable angina pectoris (HCC)   PAF (paroxysmal atrial fibrillation) (HCC)   CRI (chronic renal insufficiency), stage 3 (moderate) ([place])   Lacunar infarction   Carotid occlusion, left   Hyponatremia   Allergies Allergies  Allergen Reactions  . Atorvastatin     Tremors  . Simvastatin     Muscle weakness    Diagnostic Studies/Procedures    Cath 07/14/2017 Conclusion     Dist LM to Ost LAD lesion is 70% stenosed.  Prox LAD to Mid LAD lesion is 30% stenosed.  Mid LAD to Dist LAD lesion is 100% stenosed.  Ost Cx lesion is 70% stenosed.  Prox RCA lesion is 100% stenosed.  LV end diastolic pressure is moderately elevated.  Balloon angioplasty was performed using a BALLOON EUPHORA [id number].  Post intervention, there is a 100% residual stenosis.   1. 3 vessel obstructive CAD.    - culprit lesion is occlusion of distal LAD. Unsuccessful attempt at PTCA- unable to restore flow despite balloon angioplasty.    - 70% ostial LAD with concentric calcified plaque. Minimal lumen area 5.2 mm squared by IVUS    - approximately 70% ostial LCx disease. Unable to  cross with IVUS catheter.    - CTO of the proximal RCA with good left to right collaterals 2. Moderately elevated LVEDP 3. Persistent Afib   Plan: aggressive medical management. Assess LV functio by Echo. It is unclear the significance of the ostial LAD and LCx. If he has refractory angina I would consider referral for CABG. If symptoms improve I would consider ischemic work up with a stress myoview in the near future to assess need for further revascularization.     Echo 07/14/2017 - Left ventricle: LVEF is approximately 40% with akinesis of the   distal inferoseptal wall and apex. Aneurysmal dilatation of apex.   Consider limited echo with contrast to evaluate for thrombus. The   cavity size was normal. Wall thickness was increased in a pattern   of mild LVH. - Mitral valve: Calcified annulus. Mildly thickened leaflets .     MRI of brain 07/28/2017 IMPRESSION: 1. Diffusion and axial T2 FLAIR sequences were acquired. 2. Subcentimeter acute infarction in left parietal periventricular white matter. No mass effect or gross hemorrhage. 3. Stable chronic microvascular ischemic changes and parenchymal volume loss of the brain.    MRA of Head 07/29/2017 IMPRESSION: Interval occlusion of the left internal carotid artery with absence of antegrade flow at the skullbase. Diminished but present flow in the anterior circulation on the left probably secondary to patent anterior and posterior communicating arteries.  Stenosis of the A1 segment on the right, additional importance based on the relevance to collateral flow to the left  hemisphere.  Approximately 70% stenosis of the right vertebral artery at the foramen magnum level. Antegrade flow does persist. Dominant left vertebral artery freely supplies the basilar. _____________   History of Present Illness     Jose [name] is a 66 yo male with PMH of  CAD (CTO of RCA, with residual disease in LAD/Lcx), COPD, HL, HTN, obesity, DM and  tobacco use. Underwent cardiac cath in 2015 with Jose [name] that showed a CTO of RCA, with residual disease in the LAD, and Lcx. Unsuccessful attempt at the RCA. He has been on DAPt with ASA/plavix since that time. Was last seen in the office by Jose [name] on 10/17 and reported begin in his usual state of health. Last echo 6/17 with normal EF and G1DD.   He presented to [facility] on 07/14/17 with chest pain and dyspnea. Reports this started about 3 days ago, and has be intermittent. He is on chronic home o2 but has been more short of breath with activity. This morning his wife became concerned about his symptoms and called EMS. Was given 324 ASA. Reported to be in Afib on EMS.   On arrival to [facility] his EKG showed new onset Afib with ST elevation in the inferolateral leads. CODE STEMI was called. He was given 4000 units of heparin, IV Dilt 10 x1, then 15mg  x1 and remained on Dilt gtt. Brought directly to the cath lab for emergent cardiac cath. Troponin on arrival 20.79, Cr 1.5, Na+ 130, Hgb 12.6.    Hospital Course     Consultants: Wound Care, Hospitalist Jose [name], Neurology Jose [name], ENT Jose [name]  Patient underwent emergent cardiac catheterization on 07/14/2017 which showed 70% distal left main, 100% mid to distal LAD occlusion, 70% ostial left circumflex, 100% proximal RCA.  The culprit lesion was felt to be distal LAD occlusion, however PTCA was unsuccessful.  Patient was also in persistent atrial fibrillation at that time.  Follow-up echocardiogram showed EF of 40% with akinesis of the distal inferoseptal wall and apex, aneurysmal dilatation of the apex.  He was placed on amiodarone and metoprolol for his atrial fibrillation.  Patient was eventually placed on aspirin, Plavix and Eliquis triple therapy, aspirin later discontinued given the need to prolonged plavix and eliquis.  Hospital course complicated by acute on chronic renal insufficiency and acute on chronic systolic heart failure.  He has  orthostasis while walking with PT. Due to intermittently altered mental status, neurology was consulted on 07/28/2017.  He also has electrolyte abnormalities including hyponatremia.  MRI of the brain obtained on the same day showed subcentimeter acute infarction in the left parietal periventricular white matter.  Interval occlusion of the left internal carotid artery with absence of antegrade flow at the skull base, 70% stenosis of the right vertebral artery at the foramen magnum level.  He had a swallow study on 07/30/2017, it was recommended that he has a ENT evaluation.  He was evaluated by ENT on 07/31/2017, there was no evidence of any infection or neoplasms in the larynx or pharynx, there was no evidence of aspiration.  Due to concern that amiodarone was contributing to the patient's weakness, this was stopped on 12/7.    Patient was seen on 08/09/2017, he was doing well from both neurological and a cardiac perspective.  We have switched him to Crestor for better control of LDL.  He will need a fasting lipid panel and LFTs in 6-8 weeks and CBC/BMET in 1-2 weeks.  Otherwise patient is doing very well  and considered stable for discharge.  Although physical therapy recommended skilled nursing facility, however family wished to go home with home health.  I have ordered home RN, PT, and OT. I have also ordered hospital bed and a bedside commode for the patient.  30 day supply of Rx given to local pharmacy. All Rx also fax to [place] [phone]   _____________  Discharge Vitals Blood pressure 127/65, pulse 74, temperature 97.9 F (36.6 C), temperature source Oral, resp. rate 20, height 6\' 1"  (1.854 m), weight 287 lb 9.6 oz (130.5 kg), SpO2 97 %.  Filed Weights   08/07/17 0610 08/08/17 0450 08/09/17 0441  Weight: 287 lb 0.6 oz (130.2 kg) 290 lb 12.6 oz (131.9 kg) 287 lb 9.6 oz (130.5 kg)    Labs & Radiologic Studies    CBC No results for input(s): WBC, NEUTROABS, HGB, HCT, MCV, PLT in the last 72  hours. Basic Metabolic Panel Recent Labs    08/08/17 0220 08/09/17 0441  NA 130* 129*  K 4.2 4.4  CL 87* 88*  CO2 33* 30  GLUCOSE 159* 136*  BUN 39* 36*  CREATININE 1.73* 1.59*  CALCIUM 8.6* 8.7*  PHOS 3.4 3.1   Liver Function Tests Recent Labs    08/08/17 0220 08/09/17 0441  ALBUMIN 2.7* 2.8*   No results for input(s): LIPASE, AMYLASE in the last 72 hours. Cardiac Enzymes No results for input(s): CKTOTAL, CKMB, CKMBINDEX, TROPONINI in the last 72 hours. BNP Invalid input(s): POCBNP D-Dimer No results for input(s): DDIMER in the last 72 hours. Hemoglobin A1C No results for input(s): HGBA1C in the last 72 hours. Fasting Lipid Panel No results for input(s): CHOL, HDL, LDLCALC, TRIG, CHOLHDL, LDLDIRECT in the last 72 hours. Thyroid Function Tests No results for input(s): TSH, T4TOTAL, T3FREE, THYROIDAB in the last 72 hours.  Invalid input(s): FREET3 _____________  Dg Chest 2 View  Result Date: 07/23/2017 CLINICAL DATA:  Pleuritic chest pain. EXAM: CHEST  2 VIEW COMPARISON:  07/17/2017 FINDINGS: Stable enlargement of the cardiac silhouette. Lungs are clear without airspace disease or pulmonary edema. Improved aeration in the right lower lung compared to the recent comparison. Trachea is midline. Negative for a pneumothorax. Bony thorax is intact. No large pleural effusions. Bridging osteophytes in the thoracic spine. IMPRESSION: No active cardiopulmonary disease. Stable cardiomegaly. Electronically Signed   By: Markus Daft M.D.   On: 07/23/2017 12:30   Mr Jodene Nam Head Wo Contrast  Result Date: 07/29/2017 CLINICAL DATA:  Acute presentation with weakness and shortness of breath. Atrial fibrillation. MRI yesterday demonstrated acute infarction in the left parietal periventricular white matter. EXAM: MRA HEAD WITHOUT CONTRAST TECHNIQUE: Angiographic images of the Circle of Willis were obtained using MRA technique without intravenous contrast. COMPARISON:  03/17/2016 FINDINGS:  Patient has developed occlusion of the left internal carotid artery in the neck with absence of antegrade flow through the skullbase and siphon region. Reconstituted flow is noted in the supraclinoid ICA with present but diminished flow in the left anterior and middle cerebral artery territories. There appears to be a patent anterior communicating artery and there may be a small patent posterior communicating artery. On the right, the internal carotid artery is patent through the skullbase and siphon region. There is fetal origin of the right PCA which appears widely patent. Anterior and middle cerebral arteries are patent. No MCA stenosis seen on the right. I think there is moderate stenosis of the A1 segment on the right. Both vertebral arteries are patent at the foramen magnum. Left  vertebral artery is dominant. There is focal stenosis of the right vertebral artery at foramen magnum, approximately 70%. Both posterior inferior cerebellar arteries show flow. No basilar stenosis. Posterior circulation branch vessels are patent. IMPRESSION: Interval occlusion of the left internal carotid artery with absence of antegrade flow at the skullbase. Diminished but present flow in the anterior circulation on the left probably secondary to patent anterior and posterior communicating arteries. Stenosis of the A1 segment on the right, additional importance based on the relevance to collateral flow to the left hemisphere. Approximately 70% stenosis of the right vertebral artery at the foramen magnum level. Antegrade flow does persist. Dominant left vertebral artery freely supplies the basilar. Electronically Signed   By: Nelson Chimes M.D.   On: 07/29/2017 11:34   Mr Brain Wo Contrast  Result Date: 07/28/2017 CLINICAL DATA:  66 y/o M; Focal neuro deficit, > 6 hrs, stroke suspected. EXAM: MRI HEAD WITHOUT CONTRAST TECHNIQUE: Axial DWI and axial T2 FLAIR propeller sequences were acquired. Patient was unable to continue the  examination and additional sequences were not acquired. COMPARISON:  03/17/2016 MRI of the head. FINDINGS: Subcentimeter focus of reduced diffusion in left parietal periventricular white matter (series 3, image 36). No mass effect or gross hemorrhage. Stable chronic microvascular ischemic changes and parenchymal volume loss of the brain. No abnormal extra-axial FLAIR signal to suggest hemorrhage. No herniation or effacement of basilar cisterns. Small right maxillary sinus mucous retention cyst. IMPRESSION: 1. Diffusion and axial T2 FLAIR sequences were acquired. 2. Subcentimeter acute infarction in left parietal periventricular white matter. No mass effect or gross hemorrhage. 3. Stable chronic microvascular ischemic changes and parenchymal volume loss of the brain. These results will be called to the ordering clinician or representative by the Radiologist Assistant, and communication documented in the PACS or zVision Dashboard. Electronically Signed   By: Kristine Garbe M.D.   On: 07/28/2017 22:36   US Renal  Result Date: 07/15/2017 CLINICAL DATA:  Initial evaluation for acute kidney injury. History of diabetes, hypertension. Obesity. EXAM: RENAL / URINARY TRACT ULTRASOUND COMPLETE COMPARISON:  None. FINDINGS: Right Kidney: Length: 13.5 cm. Right kidney demonstrates a somewhat lobulated contour. Diffusely increased echogenicity, suggesting medical renal disease. No mass or hydronephrosis visualized. Trace free perinephric fluid noted. Left Kidney: Length: 14.3 cm. Left kidney demonstrates a lobulated contour. Diffusely increased echogenicity, suggesting medical renal disease. No mass or hydronephrosis visualized. Bladder: Appears normal for degree of bladder distention. IMPRESSION: Diffusely increased echogenicity, suggesting medical renal disease. No hydronephrosis. Electronically Signed   By: Jeannine Boga M.D.   On: 07/15/2017 21:23   Dg Chest Port 1 View  Result Date:  07/17/2017 CLINICAL DATA:  Dyspnea EXAM: PORTABLE CHEST 1 VIEW COMPARISON:  Chest x-ray dated 03/17/2016. FINDINGS: Ill-defined opacity at the right lung base. Stable cardiomegaly. Mild bilateral interstitial prominence is stable, presumably chronic. IMPRESSION: 1. Ill-defined opacity at the right lung base, pneumonia versus asymmetric edema. If afebrile, [name] asymmetric pulmonary edema. 2. Cardiomegaly with bilateral interstitial edema suggesting mild CHF. Appearance is similar to the previous study of 03/17/2016, compatible with a mild chronic CHF. Electronically Signed   By: Franki Cabot M.D.   On: 07/17/2017 10:44   Dg Swallowing Func-speech Pathology  Result Date: 07/30/2017 Objective Swallowing Evaluation: Type of Study: MBS-Modified Barium Swallow Study  Patient Details Name: RIEN [name] MRN: [id number] Date of Birth: 1950-12-30 Today's Date: 07/30/2017 Time: SLP Start Time (ACUTE ONLY): 1500 -SLP Stop Time (ACUTE ONLY): 1520 SLP Time Calculation (min) (ACUTE ONLY): 20  min Past Medical History: Past Medical History: Diagnosis Date . CAD (coronary artery disease)   a. prior silent inferior MI, NSTEMI 07/2014 with chronically occluded RCA s/p unsuccessful PCI with otherwise nonobstructive residual disease . Chronic respiratory failure ([place])   a. Placed on home O2 01/2016. [name] Kitchen COPD (chronic obstructive pulmonary disease) ([place])  . Diabetes mellitus ([place])  . Former tobacco use  . Hyperlipidemia  . Hypertension  . Morbid obesity ([place])  . Neuropathy  . Sleep apnea  Past Surgical History: Past Surgical History: Procedure Laterality Date . BACK SURGERY   . CORONARY BALLOON ANGIOPLASTY N/A 07/14/2017  Procedure: CORONARY BALLOON ANGIOPLASTY;  Surgeon: Martinique, Peter M, MD;  Location: [facility];  Service: Cardiovascular;  Laterality: N/A; . INTRAVASCULAR ULTRASOUND/IVUS N/A 07/14/2017  Procedure: Intravascular Ultrasound/IVUS;  Surgeon: Martinique, Peter M, MD;  Location: [place];  Service:  Cardiovascular;  Laterality: N/A; . LEFT HEART CATH AND CORONARY ANGIOGRAPHY N/A 07/14/2017  Procedure: LEFT HEART CATH AND CORONARY ANGIOGRAPHY;  Surgeon: Martinique, Peter M, MD;  Location: [place];  Service: Cardiovascular;  Laterality: N/A; . LEFT HEART CATHETERIZATION WITH CORONARY ANGIOGRAM N/A 08/28/2014  Procedure: LEFT HEART CATHETERIZATION WITH CORONARY ANGIOGRAM;  Surgeon: Clent Demark, MD;  Location: [place] [facility];  Service: Cardiovascular;  Laterality: N/A; . PERIPHERAL VASCULAR CATHETERIZATION Bilateral 03/22/2016  Procedure: Carotid Angiography;  Surgeon: Elam Dutch, MD;  Location: [facility];  Service: Cardiovascular;  Laterality: Bilateral; HPI: Pt is a 66 y.o. male who presented [facility] on 07/14/17 with chest pain and dyspnea. On arrival to [facility], his EKG showed new onset A-fib with ST elevation in the inferolateral leads; CODE STEMI was called. Cardiac cath 11/15 with unsuccessful attempt at revascularization of occluded distal LAD, chronic occlusion of RCA. MRI on 07/28/17 revealed subcentimeter acute infarction in left parietal periventricular white matter Pertinent PMH includes CAD, COPD, HL, HTN, obesity,DMand tobacco use. BSE initiated due to neuro changes (onset dysarthria).  Subjective: Alert, cooperative Assessment / Plan / Recommendation CHL IP CLINICAL IMPRESSIONS 07/30/2017 Clinical Impression Patient presents with mild pharyngeal phase dysphagia due to structural anomaly, with prominent epiglottic tissue which appears to extend inferiorly. This impedes complete epiglottic deflection and laryngeal closure, resulting in intermittent transient penetration during the swallow with liquids. Mild-moderate residue (solids>liquids) in the valleculae which reduces with subsequent dry swallows, liquid wash. Recommend pt consume regular diet with thin liquids; SLP will follow briefly for tolerance and training in compensatory strategies. Pt would benefit from ENT consult to  further assess epiglottic tissue, particularly as pt has been complaining of odynophagia and has a history of tobacco abuse. SLP Visit Diagnosis Dysphagia, pharyngeal phase (R13.13) Attention and concentration deficit following -- Frontal lobe and executive function deficit following -- Impact on safety and function Mild aspiration risk   CHL IP TREATMENT RECOMMENDATION 07/30/2017 Treatment Recommendations Therapy as outlined in treatment plan below   Prognosis 07/30/2017 Prognosis for Safe Diet Advancement Good Barriers to Reach Goals -- Barriers/Prognosis Comment -- CHL IP DIET RECOMMENDATION 07/30/2017 SLP Diet Recommendations Regular solids;Thin liquid Liquid Administration via Cup;Straw Medication Administration Whole meds with liquid Compensations Follow solids with liquid;Clear throat intermittently;Multiple dry swallows after each bite/sip;Slow rate;Small sips/bites Postural Changes Seated upright at 90 degrees   CHL IP OTHER RECOMMENDATIONS 07/30/2017 Recommended Consults Consider ENT evaluation Oral Care Recommendations Oral care BID Other Recommendations --   CHL IP FOLLOW UP RECOMMENDATIONS 07/30/2017 Follow up Recommendations Other (comment)   CHL IP FREQUENCY AND DURATION  07/30/2017 Speech Therapy Frequency (ACUTE ONLY) min 1 x/week Treatment Duration 1 week      CHL IP ORAL PHASE 07/30/2017 Oral Phase WFL Oral - Pudding Teaspoon -- Oral - Pudding Cup -- Oral - Honey Teaspoon -- Oral - Honey Cup -- Oral - Nectar Teaspoon -- Oral - Nectar Cup -- Oral - Nectar Straw -- Oral - Thin Teaspoon -- Oral - Thin Cup -- Oral - Thin Straw -- Oral - Puree -- Oral - Mech Soft -- Oral - Regular -- Oral - Multi-Consistency -- Oral - Pill -- Oral Phase - Comment --  CHL IP PHARYNGEAL PHASE 07/30/2017 Pharyngeal Phase Impaired Pharyngeal- Pudding Teaspoon -- Pharyngeal -- Pharyngeal- Pudding Cup -- Pharyngeal -- Pharyngeal- Honey Teaspoon -- Pharyngeal -- Pharyngeal- Honey Cup -- Pharyngeal -- Pharyngeal- Nectar Teaspoon --  Pharyngeal -- Pharyngeal- Nectar Cup -- Pharyngeal -- Pharyngeal- Nectar Straw -- Pharyngeal -- Pharyngeal- Thin Teaspoon -- Pharyngeal -- Pharyngeal- Thin Cup Reduced epiglottic inversion;Reduced airway/laryngeal closure;Penetration/Aspiration during swallow;Pharyngeal residue - valleculae Pharyngeal Material enters airway, remains ABOVE vocal cords then ejected out Pharyngeal- Thin Straw Reduced epiglottic inversion;Reduced airway/laryngeal closure;Penetration/Aspiration during swallow;Pharyngeal residue - valleculae Pharyngeal Material enters airway, remains ABOVE vocal cords then ejected out Pharyngeal- Puree Reduced epiglottic inversion;Reduced airway/laryngeal closure;Pharyngeal residue - valleculae Pharyngeal -- Pharyngeal- Mechanical Soft -- Pharyngeal -- Pharyngeal- Regular Reduced epiglottic inversion;Reduced airway/laryngeal closure;Pharyngeal residue - valleculae Pharyngeal -- Pharyngeal- Multi-consistency -- Pharyngeal -- Pharyngeal- Pill Reduced epiglottic inversion;Reduced airway/laryngeal closure Pharyngeal -- Pharyngeal Comment --  CHL IP CERVICAL ESOPHAGEAL PHASE 07/30/2017 Cervical Esophageal Phase WFL Pudding Teaspoon -- Pudding Cup -- Honey Teaspoon -- Honey Cup -- Nectar Teaspoon -- Nectar Cup -- Nectar Straw -- Thin Teaspoon -- Thin Cup -- Thin Straw -- Puree -- Mechanical Soft -- Regular -- Multi-consistency -- Pill -- Cervical Esophageal Comment -- No flowsheet data found. Aliene Altes 07/30/2017, 3:52 PM Deneise Lever, MS, [place] Speech-Language Pathologist [phone]             Disposition   Pt is being discharged home today in good condition.  Follow-up Plans & Appointments     Contact information for follow-up providers    [facility] Follow up.   Why:  Hospital bed arranged to be delivered to home.  (Bedside commode and wheelchair to be delivered to room prior to discharge). Contact information: [place]  [address] [address], [facility] Follow up.   Specialty:  Home Health Services Why:  Physical therapy, Occupational therapy and RN arranged.  Will contact you to set up visits. Contact information: [place] [address] [phone]        Arnoldo Lenis, MD Follow up.   Specialty:  Cardiology Why:  our scheduler will contact you to arrange outpatient visit, please give [place] a call if you do not hear from [place] in 3 business days.  Contact information: [address] [place] [address] [phone]            Contact information for after-discharge care    [facility] Follow up.   Service:  Skilled Nursing Contact information: [address] [place] [phone]                 Discharge Instructions    AMB Referral to Cardiac Rehabilitation - Phase II   Complete by:  As directed  Diagnosis:  STEMI   Amb Referral to Cardiac Rehabilitation   Complete by:  As directed    Diagnosis:  STEMI   Diet - low sodium heart healthy   Complete by:  As directed    Increase activity slowly   Complete by:  As directed       Discharge Medications   Allergies as of 08/09/2017      Reactions   Atorvastatin    Tremors   Simvastatin    Muscle weakness      Medication List    STOP taking these medications   aspirin 81 MG tablet   metoprolol tartrate 25 MG tablet Commonly known as:  LOPRESSOR   pravastatin 20 MG tablet Commonly known as:  PRAVACHOL     TAKE these medications   apixaban 5 MG Tabs tablet Commonly known as:  ELIQUIS Take 1 tablet (5 mg total) by mouth 2 (two) times daily.   clopidogrel 75 MG tablet Commonly known as:  PLAVIX Take 1 tablet (75 mg total) by mouth daily.   ergocalciferol 50000 units capsule Commonly known as:  VITAMIN D2 Take 50,000 Units by mouth See admin instructions. Take one twice a week on wednesdays and  saturdays   ezetimibe 10 MG tablet Commonly known as:  ZETIA Take 1 tablet (10 mg total) by mouth daily. Start taking on:  08/10/2017   ferrous sulfate 325 (65 FE) MG tablet Take 325 mg by mouth 3 (three) times daily with meals.   Fish Oil 1000 MG Caps Take 2,000-3,000 mg See admin instructions by mouth. Take 2000 mg every morning Take 3000 mg at noon and evening.   furosemide 40 MG tablet Commonly known as:  LASIX Take 40 mg by mouth 2 (two) times daily.   glipiZIDE 5 MG tablet Commonly known as:  GLUCOTROL Take 5 mg 2 (two) times daily by mouth. 10mg  in the morning, 5mg  in the afternoon, 5mg  in the evening   ICAPS AREDS 2 PO Take 1 capsule by mouth 2 (two) times daily.   insulin aspart 100 UNIT/ML injection Commonly known as:  novoLOG Inject 30 Units into the skin 3 (three) times daily before meals.   insulin glargine 100 UNIT/ML injection Commonly known as:  LANTUS Inject 0.3-0.6 mLs (30-60 Units total) into the skin See admin instructions. 50 units in the AM and 60 units in the PM What changed:    how much to take  additional instructions   isosorbide mononitrate 30 MG 24 hr tablet Commonly known as:  IMDUR Take 0.5 tablets (15 mg total) by mouth daily. Start taking on:  08/10/2017   magnesium oxide 400 MG tablet Commonly known as:  MAG-OX Take 1 tablet (400 mg total) by mouth daily.   metFORMIN 1000 MG tablet Commonly known as:  GLUCOPHAGE Take 1 tablet (1,000 mg total) by mouth 2 (two) times daily with a meal.   metoprolol succinate 25 MG 24 hr tablet Commonly known as:  TOPROL-XL Take 0.5 tablets (12.5 mg total) by mouth daily. Start taking on:  08/10/2017   mupirocin cream 2 % Commonly known as:  BACTROBAN Apply topically daily. What changed:    when to take this  reasons to take this   nitroGLYCERIN 0.4 MG SL tablet Commonly known as:  NITROSTAT Place 1 tablet (0.4 mg total) under the tongue every 5 (five) minutes x 3 doses as needed for  chest pain.   oxyCODONE-acetaminophen 5-325 MG tablet Commonly known as:  PERCOCET/ROXICET Take 2 tablets by  mouth every 4 (four) hours. Take eight tablets daily as needed for pain per family and list. For bad  Back pain   rosuvastatin 10 MG tablet Commonly known as:  CRESTOR Take 1 tablet (10 mg total) by mouth daily at 6 PM.   sertraline 100 MG tablet Commonly known as:  ZOLOFT Take 100 mg by mouth daily.   tiotropium 18 MCG inhalation capsule Commonly known as:  SPIRIVA Place 18 mcg into inhaler and inhale at bedtime.   vitamin B-12 1000 MCG tablet Commonly known as:  CYANOCOBALAMIN Take 1,000 mcg by mouth daily.            Durable Medical Equipment  (From admission, onward)        Start     Ordered   08/09/17 1100  For home use only DME Hospital bed  Once    Question Answer Comment  Patient has (list medical condition): stroke, heart failure, myocardial infarction   Bed type Semi-electric      08/09/17 1104   08/09/17 1100  For home use only DME Bedside commode  Once    Question:  Patient needs a bedside commode to treat with the following condition  Answer:  Physical deconditioning   08/09/17 1104   07/15/17 1133  For home use only DME standard manual wheelchair with seat cushion  Once    Comments:  Patient suffers from weakness which impairs their ability to perform daily activities like walking in the home.  A walking aid  will not resolve  issue with performing activities of daily living. A wheelchair will allow patient to safely perform daily activities. Patient can safely propel the wheelchair in the home or has a caregiver who can provide assistance.  Accessories: elevating leg rests (ELRs), wheel locks, extensions and anti-tippers.  Pt is 295 lbs   07/15/17 1135       Aspirin prescribed at discharge?  No: given need to eliquis High Intensity Statin Prescribed? (Lipitor 40-80mg  or Crestor 20-40mg ): No: intolerant Beta Blocker Prescribed? Yes For EF  <40%, was ACEI/ARB Prescribed? No: acute renal insufficiency ADP Receptor Inhibitor Prescribed? (i.e. Plavix etc.-Includes Medically Managed Patients): Yes For EF <40%, Aldosterone Inhibitor Prescribed? No:  Was EF assessed during THIS hospitalization? Yes Was Cardiac Rehab II ordered? (Included Medically managed Patients): No: Physical therapy and OT   Outstanding Labs/Studies   FLP and LFT in 6-8 weeks. BMET and CBC in 1-2 weeks  Duration of Discharge Encounter   Greater than 30 minutes including physician time.  SignedAlmyra Deforest NP 08/09/2017, 12:46 PM

## 2017-08-09 NOTE — Progress Notes (Signed)
OT Cancellation Note  Patient Details Name: Jose [name] MRN: [id number] DOB: [birth year]   Cancelled Treatment:    Reason Eval/Treat Not Completed: Patient declined, no reason specified;Other (comment). Pt preparing to d/c home with wife and will have assist from family with [place] vs SNF rehab.  [name] [facility] 08/09/2017, 1:56 PM

## 2017-08-09 NOTE — Care Management (Signed)
    Durable Medical Equipment  (From admission, onward)        Start     Ordered   08/09/17 1100  For home use only DME Hospital bed  Once    Question Answer Comment  Patient has (list medical condition): stroke, heart failure, myocardial infarction   Bed type Semi-electric      08/09/17 1104   08/09/17 1100  For home use only DME Bedside commode  Once    Question:  Patient needs a bedside commode to treat with the following condition  Answer:  Physical deconditioning   08/09/17 1104   07/15/17 1133  For home use only DME standard manual wheelchair with seat cushion  Once    Comments:  Patient suffers from weakness which impairs their ability to perform daily activities like walking in the home.  A walking aid  will not resolve  issue with performing activities of daily living. A wheelchair will allow patient to safely perform daily activities. Patient can safely propel the wheelchair in the home or has a caregiver who can provide assistance.  Accessories: elevating leg rests (ELRs), wheel locks, extensions and anti-tippers.  Pt is 295 lbs   07/15/17 1135

## 2017-08-09 NOTE — Progress Notes (Signed)
Patient ID: Jose [name], male   DOB: 08/14/1951, 66 y.o.   MRN: [id number] S: Doing well and walking with PT O:BP 127/65   Pulse 74   Temp 97.9 F (36.6 C) (Oral)   Resp 20   Ht 6\' 1"  (1.854 m)   Wt 130.5 kg (287 lb 9.6 oz)   SpO2 97%   BMI 37.94 kg/m   Intake/Output Summary (Last 24 hours) at 08/09/2017 1124 Last data filed at 08/08/2017 1700 Gross per 24 hour  Intake 480 ml  Output 300 ml  Net 180 ml   Intake/Output: I/O last 3 completed shifts: In: 960 [P.O.:960] Out: 300 [Urine:300]  Intake/Output this shift:  No intake/output data recorded. Weight change: -1.446 kg (-3 oz) Gen: NAD CVS: no rub Resp:cta GBT:DVVOHY Ext: 1+ lower extremity edema  Recent Labs  Lab 08/03/17 0316 08/04/17 0253 08/05/17 0127 08/06/17 0218 08/07/17 0228 08/08/17 0220 08/09/17 0441  NA 123* 125* 126* 128* 128* 130* 129*  K 4.1 4.2 4.0 4.7 4.0 4.2 4.4  CL 79* 81* 81* 82* 86* 87* 88*  CO2 30 32 34* 34* 32 33* 30  GLUCOSE 106* 97 152* 150* 167* 159* 136*  BUN 66* 62* 53* 47* 45* 39* 36*  CREATININE 2.17* 1.98* 1.92* 1.93* 1.75* 1.73* 1.59*  ALBUMIN 2.4* 2.5* 2.4* 2.7* 2.5* 2.7* 2.8*  CALCIUM 8.2* 8.4* 8.3* 8.8* 8.5* 8.6* 8.7*  PHOS 4.7* 4.2 3.7 3.5 3.4 3.4 3.1   Liver Function Tests: Recent Labs  Lab 08/07/17 0228 08/08/17 0220 08/09/17 0441  ALBUMIN 2.5* 2.7* 2.8*   No results for input(s): LIPASE, AMYLASE in the last 168 hours. No results for input(s): AMMONIA in the last 168 hours. CBC: Recent Labs  Lab 08/04/17 0253  WBC 6.2  HGB 9.4*  HCT 28.5*  MCV 84.8  PLT 336   Cardiac Enzymes: No results for input(s): CKTOTAL, CKMB, CKMBINDEX, TROPONINI in the last 168 hours. CBG: Recent Labs  Lab 08/08/17 0625 08/08/17 1141 08/08/17 1647 08/08/17 2053 08/09/17 0628  GLUCAP 146* 208* 205* 170* 186*    Iron Studies: No results for input(s): IRON, TIBC, TRANSFERRIN, FERRITIN in the last 72 hours. Studies/Results: No results found. [name] Kitchen apixaban  5 mg Oral BID  .  clopidogrel  75 mg Oral Daily  . ezetimibe  10 mg Oral Daily  . ferrous sulfate  325 mg Oral Q breakfast  . furosemide  40 mg Oral BID  . Influenza vac split quadrivalent PF  0.5 mL Intramuscular Tomorrow-1000  . insulin aspart  0-5 Units Subcutaneous QHS  . insulin aspart  0-9 Units Subcutaneous TID WC  . insulin aspart  8 Units Subcutaneous TID WC  . insulin glargine  40 Units Subcutaneous BID  . isosorbide mononitrate  15 mg Oral Daily  . magnesium oxide  400 mg Oral Daily  . metoprolol succinate  12.5 mg Oral Daily  . omega-3 acid ethyl esters  2 g Oral Q breakfast  . omega-3 acid ethyl esters  3 g Oral BID AC  . polyethylene glycol  17 g Oral BID  . rosuvastatin  10 mg Oral q1800  . sertraline  100 mg Oral Daily  . tiotropium  18 mcg Inhalation QHS    BMET    Component Value Date/Time   NA 129 (L) 08/09/2017 0441   K 4.4 08/09/2017 0441   CL 88 (L) 08/09/2017 0441   CO2 30 08/09/2017 0441   GLUCOSE 136 (H) 08/09/2017 0441   BUN 36 (H) 08/09/2017 0441  CREATININE 1.59 (H) 08/09/2017 0441   CREATININE 0.96 05/07/2016 1425   CALCIUM 8.7 (L) 08/09/2017 0441   GFRNONAA 44 (L) 08/09/2017 0441   GFRAA 51 (L) 08/09/2017 0441   CBC    Component Value Date/Time   WBC 6.2 08/04/2017 0253   RBC 3.36 (L) 08/04/2017 0253   HGB 9.4 (L) 08/04/2017 0253   HCT 28.5 (L) 08/04/2017 0253   PLT 336 08/04/2017 0253   MCV 84.8 08/04/2017 0253   MCH 28.0 08/04/2017 0253   MCHC 33.0 08/04/2017 0253   RDW 14.6 08/04/2017 0253   LYMPHSABS 1.2 07/20/2017 0602   MONOABS 0.8 07/20/2017 0602   EOSABS 0.3 07/20/2017 0602   BASOSABS 0.0 07/20/2017 0602    Assessment/Plan:  1. AKI/CKD following STEMI complicated by volume overload.  Improving again and likely hemodynamically mediated.  Slowly heading back toward baseline of 1.2.  Nothing further to add.  Will sign off.  He can follow up with our office as an outpatient with Dr. Joelyn Oms if renal function does not return to baseline.     2. Hyponatremia- improving as well.  Continue with lasix. 3. CAD STEMI/CHF- unsuccessful PCI to distal LAD, plan per Cardiology.  4. Anemia- follow H/H and transfuse prn 5. A fib- on eliquis, rate controlled with toprol   Donetta Potts, MD [facility] [phone]

## 2017-08-09 NOTE — Progress Notes (Signed)
Discharge orders given to patient and wife. Prescriptions given to patient and faxed to [place]. Fax confirmations and copies of prescriptions given to patient. IV removed, clean and intact. Telemetry removed.   Clyde Canterbury, RN

## 2017-08-09 NOTE — Progress Notes (Signed)
Progress Note  Patient Name: Jose [name] Date of Encounter: 08/09/2017  Primary Cardiologist: Dr. Harl Bowie  Subjective   Denies any chest pain or SOB. Fairly stable. Ambulated with case manager yesterday. PT recommended SNF, but family wish to go home with home health given the holiday season.    Inpatient Medications    Scheduled Meds: . apixaban  5 mg Oral BID  . clopidogrel  75 mg Oral Daily  . ezetimibe  10 mg Oral Daily  . ferrous sulfate  325 mg Oral Q breakfast  . furosemide  40 mg Oral BID  . Influenza vac split quadrivalent PF  0.5 mL Intramuscular Tomorrow-1000  . insulin aspart  0-5 Units Subcutaneous QHS  . insulin aspart  0-9 Units Subcutaneous TID WC  . insulin aspart  8 Units Subcutaneous TID WC  . insulin glargine  40 Units Subcutaneous BID  . isosorbide mononitrate  15 mg Oral Daily  . magnesium oxide  400 mg Oral Daily  . metoprolol succinate  12.5 mg Oral Daily  . omega-3 acid ethyl esters  2 g Oral Q breakfast  . omega-3 acid ethyl esters  3 g Oral BID AC  . polyethylene glycol  17 g Oral BID  . pravastatin  20 mg Oral q1800  . sertraline  100 mg Oral Daily  . tiotropium  18 mcg Inhalation QHS   Continuous Infusions:  PRN Meds: acetaminophen, albuterol, lidocaine, lidocaine, lidocaine-EPINEPHrine, nitroGLYCERIN, ondansetron (ZOFRAN) IV, oxyCODONE-acetaminophen, oxymetazoline, silver nitrate applicators, TRIPLE ANTIBIOTIC   Vital Signs    Vitals:   08/08/17 2028 08/09/17 0200 08/09/17 0441 08/09/17 0856  BP:   (!) 145/71 127/65  Pulse:   75 74  Resp:  14 20   Temp:   97.9 F (36.6 C)   TempSrc:   Oral   SpO2: 95%  97%   Weight:   287 lb 9.6 oz (130.5 kg)   Height:        Intake/Output Summary (Last 24 hours) at 08/09/2017 1048 Last data filed at 08/08/2017 1700 Gross per 24 hour  Intake 480 ml  Output 300 ml  Net 180 ml   Filed Weights   08/07/17 0610 08/08/17 0450 08/09/17 0441  Weight: 287 lb 0.6 oz (130.2 kg) 290 lb 12.6 oz  (131.9 kg) 287 lb 9.6 oz (130.5 kg)    Telemetry    NSR without significant ventricular ectopy - Personally Reviewed  ECG    Last EKG 07/16/2017, inferior STEMI, sinus rhythm - no new EKG - Personally Reviewed  Physical Exam   GEN: No acute distress.  Some degree of facial drooping Neck: No JVD Cardiac: RRR, no murmurs, rubs, or gallops.  Respiratory: Clear to auscultation bilaterally. GI: Soft, nontender, non-distended  MS: No deformity. 2-3+ pitting edema, worse on the left compare to the right Neuro:  Nonfocal  Psych: Normal affect   Labs    Chemistry Recent Labs  Lab 08/07/17 0228 08/08/17 0220 08/09/17 0441  NA 128* 130* 129*  K 4.0 4.2 4.4  CL 86* 87* 88*  CO2 32 33* 30  GLUCOSE 167* 159* 136*  BUN 45* 39* 36*  CREATININE 1.75* 1.73* 1.59*  CALCIUM 8.5* 8.6* 8.7*  ALBUMIN 2.5* 2.7* 2.8*  GFRNONAA 39* 39* 44*  GFRAA 45* 46* 51*  ANIONGAP 10 10 11      Hematology Recent Labs  Lab 08/04/17 0253  WBC 6.2  RBC 3.36*  HGB 9.4*  HCT 28.5*  MCV 84.8  MCH 28.0  MCHC 33.0  RDW 14.6  PLT 336    Cardiac EnzymesNo results for input(s): TROPONINI in the last 168 hours. No results for input(s): TROPIPOC in the last 168 hours.   BNPNo results for input(s): BNP, PROBNP in the last 168 hours.   DDimer No results for input(s): DDIMER in the last 168 hours.   Radiology    No results found.  Cardiac Studies   Cardiac cath July 14, 2017 -unsuccessful attempted PTCA of occluded distal LAD, chronic occlusion of RCA Diagnostic Diagram       Post-Intervention Diagram        Echo July 14, 2017 - Left ventricle: LVEF is approximately 40% with akinesis of the distal inferoseptal wall and apex. Aneurysmal dilatation of apex. Consider limited echo with contrast to evaluate for thrombus. The cavity size was normal. Wall thickness was increased in a pattern of mild LVH. - Mitral valve: Calcified annulus. Mildly thickened  leaflets     Patient Profile     66 y.o. male with PMH of CAD (previous cath 12/15 with unsuccessful PCI of RCA), HTN, HLD, COPD, OSA on CPAP and CVA who presented on 07/14/2017 with extensive anteroapical and inferior infarction. He had unsuccessful attempt at revascularization of occluded distal LAD, chronic occlusion ofRCA. Moderate reduction in EF. He has atrial fibrillation with RVR on arrival, placed on eliquis, controlled with amiodarone. He also developed neurological symptoms with positive acute infarct on MRI and rising Cr requiring nephrology consult.    Assessment & Plan    1. CAD s/p unsuccessful PCI to distal LAD  - Echo 06/2017 EF 40%, apical hypokinesis  - continue plavix (not on asa since on eliquis)    - continue plavix, zetia, toprol XL, pravachol  - stable for discharge today, family wish to go home with home health. Also Rx has to be sent to [place]. Will discuss with case Freight forwarder and Education officer, [employer].   2. Acute on chronic systolic HF: lung is clear, although continue to have 2-3+ pitting edema in the lower extremity, worse on the left.   - he likely will always have some degree of edema in the leg, recommend continue on lasix PO 40mg  BID with instruction of additional 40mg  lasix on a as needed basis. He is aware he need to cut back on salt.  - I am in favor to conservative therapy at this point for his leg swelling on top of his lasix instead of increasing lasix dose. Recommend compression stocking and leg elevation .    3. Atrial fibrillation: on eliquis and metoprolol. Maintaining sinus rhythm on telemetry   3. Acute infarction in the left parietal area  - MRI 07/28/2017 showed subcentimeter acute infarction in L parietal periventricular white matter  - MRA 07/29/2017 showed interval occlusion of L internal carotid artery with absence of antegrade flow at the skullbase. Approx 70% stenosis of the R vertebral artery at the foramen magnum level  4. Acute on chronic renal  insufficiency  - though to be due to relative hypotension.   - arrived with Cr 1.5, Cr peaked at 2.25, now improving to 1.73  5. Uncontrolled HLD: last lipid panel 07/15/2017 chol 264, trig 255, HDL 37, LDL 176. Cannot tolerate lipitor and zocor. Switched to Crestor yesterday, need 6-8 weeks FLP and LFT  6. Deconditioning: PT    For questions or updates, please contact [place] Please consult [email or web address] for contact info under Cardiology/STEMI.      Signed, Shelva Majestic, MD  08/09/2017, 10:48 AM  Patient seen and examined. Agree with assessment and plan.  I/O -  [id number] since admission. No recurrent chest pain. Ready to go home. Discussed Na restriction, support stockings with 20 - 30 mm Hg compression, and he states that in past he was given a machine with leg wrapping to reduce LE swelling.  Pt is followed at [facility]. Willing to try low dose crestor with zetia in place of pravastatin. DC today.   Troy Sine, MD, [facility] 08/09/2017 10:48 AM

## 2017-08-10 ENCOUNTER — Telehealth: Payer: Self-pay | Admitting: Cardiology

## 2017-08-10 DIAGNOSIS — I25110 Atherosclerotic heart disease of native coronary artery with unstable angina pectoris: Secondary | ICD-10-CM | POA: Diagnosis not present

## 2017-08-10 DIAGNOSIS — N183 Chronic kidney disease, stage 3 (moderate): Secondary | ICD-10-CM | POA: Diagnosis not present

## 2017-08-10 DIAGNOSIS — I480 Paroxysmal atrial fibrillation: Secondary | ICD-10-CM | POA: Diagnosis not present

## 2017-08-10 DIAGNOSIS — E1122 Type 2 diabetes mellitus with diabetic chronic kidney disease: Secondary | ICD-10-CM | POA: Diagnosis not present

## 2017-08-10 DIAGNOSIS — I6522 Occlusion and stenosis of left carotid artery: Secondary | ICD-10-CM | POA: Diagnosis not present

## 2017-08-10 DIAGNOSIS — Z8673 Personal history of transient ischemic attack (TIA), and cerebral infarction without residual deficits: Secondary | ICD-10-CM | POA: Diagnosis not present

## 2017-08-10 DIAGNOSIS — I5033 Acute on chronic diastolic (congestive) heart failure: Secondary | ICD-10-CM | POA: Diagnosis not present

## 2017-08-10 DIAGNOSIS — I130 Hypertensive heart and chronic kidney disease with heart failure and stage 1 through stage 4 chronic kidney disease, or unspecified chronic kidney disease: Secondary | ICD-10-CM | POA: Diagnosis not present

## 2017-08-10 DIAGNOSIS — I2102 ST elevation (STEMI) myocardial infarction involving left anterior descending coronary artery: Secondary | ICD-10-CM | POA: Diagnosis not present

## 2017-08-10 NOTE — Telephone Encounter (Signed)
Informed her she needs to request order from pmd - stated that she will call the [place].

## 2017-08-10 NOTE — Telephone Encounter (Signed)
She is asking for an order for a bigger potty chair for patient

## 2017-08-10 NOTE — Telephone Encounter (Signed)
Per phone call from [place] w/ [facility] --Pt fell last night he is on 2 blood thinners --please advise if pt needs to go to ER or if they can continue to monitor him from home  Please give Malachy Mood a call @ [phone]

## 2017-08-10 NOTE — Telephone Encounter (Signed)
Spoke with [employer]. Pt v/s BP 110/60 HR 60. Pt fell during the night while ambulating to use the restroom. Pt is alert and oriented at this time. Reports being weak and pain of 6/10. Nurse reports abrasions to toes and knees. No bleeding at this time. Please advise.

## 2017-08-11 DIAGNOSIS — I25110 Atherosclerotic heart disease of native coronary artery with unstable angina pectoris: Secondary | ICD-10-CM | POA: Diagnosis not present

## 2017-08-11 DIAGNOSIS — I130 Hypertensive heart and chronic kidney disease with heart failure and stage 1 through stage 4 chronic kidney disease, or unspecified chronic kidney disease: Secondary | ICD-10-CM | POA: Diagnosis not present

## 2017-08-11 DIAGNOSIS — I2102 ST elevation (STEMI) myocardial infarction involving left anterior descending coronary artery: Secondary | ICD-10-CM | POA: Diagnosis not present

## 2017-08-11 DIAGNOSIS — N183 Chronic kidney disease, stage 3 (moderate): Secondary | ICD-10-CM | POA: Diagnosis not present

## 2017-08-11 DIAGNOSIS — I5033 Acute on chronic diastolic (congestive) heart failure: Secondary | ICD-10-CM | POA: Diagnosis not present

## 2017-08-11 DIAGNOSIS — E1122 Type 2 diabetes mellitus with diabetic chronic kidney disease: Secondary | ICD-10-CM | POA: Diagnosis not present

## 2017-08-11 NOTE — Telephone Encounter (Signed)
Ok to monitor at home, aspirin and plavix are not like some of the other blood thinners that can have a very high risk of bleeding. If no signifcant neurological changes: new onset weakness or confusion, ok to monitor at home   Zandra Abts MD

## 2017-08-11 NOTE — Telephone Encounter (Signed)
HHN Cheryl notified and voiced understanding.

## 2017-08-12 DIAGNOSIS — I130 Hypertensive heart and chronic kidney disease with heart failure and stage 1 through stage 4 chronic kidney disease, or unspecified chronic kidney disease: Secondary | ICD-10-CM | POA: Diagnosis not present

## 2017-08-12 DIAGNOSIS — E1122 Type 2 diabetes mellitus with diabetic chronic kidney disease: Secondary | ICD-10-CM | POA: Diagnosis not present

## 2017-08-12 DIAGNOSIS — N183 Chronic kidney disease, stage 3 (moderate): Secondary | ICD-10-CM | POA: Diagnosis not present

## 2017-08-12 DIAGNOSIS — I5033 Acute on chronic diastolic (congestive) heart failure: Secondary | ICD-10-CM | POA: Diagnosis not present

## 2017-08-12 DIAGNOSIS — I2102 ST elevation (STEMI) myocardial infarction involving left anterior descending coronary artery: Secondary | ICD-10-CM | POA: Diagnosis not present

## 2017-08-12 DIAGNOSIS — I25110 Atherosclerotic heart disease of native coronary artery with unstable angina pectoris: Secondary | ICD-10-CM | POA: Diagnosis not present

## 2017-08-16 DIAGNOSIS — I25110 Atherosclerotic heart disease of native coronary artery with unstable angina pectoris: Secondary | ICD-10-CM | POA: Diagnosis not present

## 2017-08-16 DIAGNOSIS — I5033 Acute on chronic diastolic (congestive) heart failure: Secondary | ICD-10-CM | POA: Diagnosis not present

## 2017-08-16 DIAGNOSIS — I130 Hypertensive heart and chronic kidney disease with heart failure and stage 1 through stage 4 chronic kidney disease, or unspecified chronic kidney disease: Secondary | ICD-10-CM | POA: Diagnosis not present

## 2017-08-16 DIAGNOSIS — I2102 ST elevation (STEMI) myocardial infarction involving left anterior descending coronary artery: Secondary | ICD-10-CM | POA: Diagnosis not present

## 2017-08-16 DIAGNOSIS — E1122 Type 2 diabetes mellitus with diabetic chronic kidney disease: Secondary | ICD-10-CM | POA: Diagnosis not present

## 2017-08-16 DIAGNOSIS — N183 Chronic kidney disease, stage 3 (moderate): Secondary | ICD-10-CM | POA: Diagnosis not present

## 2017-08-17 DIAGNOSIS — E1122 Type 2 diabetes mellitus with diabetic chronic kidney disease: Secondary | ICD-10-CM | POA: Diagnosis not present

## 2017-08-17 DIAGNOSIS — I130 Hypertensive heart and chronic kidney disease with heart failure and stage 1 through stage 4 chronic kidney disease, or unspecified chronic kidney disease: Secondary | ICD-10-CM | POA: Diagnosis not present

## 2017-08-17 DIAGNOSIS — I25110 Atherosclerotic heart disease of native coronary artery with unstable angina pectoris: Secondary | ICD-10-CM | POA: Diagnosis not present

## 2017-08-17 DIAGNOSIS — N183 Chronic kidney disease, stage 3 (moderate): Secondary | ICD-10-CM | POA: Diagnosis not present

## 2017-08-17 DIAGNOSIS — I2102 ST elevation (STEMI) myocardial infarction involving left anterior descending coronary artery: Secondary | ICD-10-CM | POA: Diagnosis not present

## 2017-08-17 DIAGNOSIS — I5033 Acute on chronic diastolic (congestive) heart failure: Secondary | ICD-10-CM | POA: Diagnosis not present

## 2017-08-18 DIAGNOSIS — E1122 Type 2 diabetes mellitus with diabetic chronic kidney disease: Secondary | ICD-10-CM | POA: Diagnosis not present

## 2017-08-18 DIAGNOSIS — I25110 Atherosclerotic heart disease of native coronary artery with unstable angina pectoris: Secondary | ICD-10-CM | POA: Diagnosis not present

## 2017-08-18 DIAGNOSIS — N183 Chronic kidney disease, stage 3 (moderate): Secondary | ICD-10-CM | POA: Diagnosis not present

## 2017-08-18 DIAGNOSIS — I5033 Acute on chronic diastolic (congestive) heart failure: Secondary | ICD-10-CM | POA: Diagnosis not present

## 2017-08-18 DIAGNOSIS — I2102 ST elevation (STEMI) myocardial infarction involving left anterior descending coronary artery: Secondary | ICD-10-CM | POA: Diagnosis not present

## 2017-08-18 DIAGNOSIS — I130 Hypertensive heart and chronic kidney disease with heart failure and stage 1 through stage 4 chronic kidney disease, or unspecified chronic kidney disease: Secondary | ICD-10-CM | POA: Diagnosis not present

## 2017-08-22 DIAGNOSIS — N183 Chronic kidney disease, stage 3 (moderate): Secondary | ICD-10-CM | POA: Diagnosis not present

## 2017-08-22 DIAGNOSIS — I2102 ST elevation (STEMI) myocardial infarction involving left anterior descending coronary artery: Secondary | ICD-10-CM | POA: Diagnosis not present

## 2017-08-22 DIAGNOSIS — I130 Hypertensive heart and chronic kidney disease with heart failure and stage 1 through stage 4 chronic kidney disease, or unspecified chronic kidney disease: Secondary | ICD-10-CM | POA: Diagnosis not present

## 2017-08-22 DIAGNOSIS — I5033 Acute on chronic diastolic (congestive) heart failure: Secondary | ICD-10-CM | POA: Diagnosis not present

## 2017-08-22 DIAGNOSIS — E1122 Type 2 diabetes mellitus with diabetic chronic kidney disease: Secondary | ICD-10-CM | POA: Diagnosis not present

## 2017-08-22 DIAGNOSIS — I25110 Atherosclerotic heart disease of native coronary artery with unstable angina pectoris: Secondary | ICD-10-CM | POA: Diagnosis not present

## 2017-08-24 ENCOUNTER — Encounter: Payer: Self-pay | Admitting: Physician Assistant

## 2017-08-24 ENCOUNTER — Other Ambulatory Visit (HOSPITAL_COMMUNITY)
Admission: RE | Admit: 2017-08-24 | Discharge: 2017-08-24 | Disposition: A | Discharge status: Home / Self Care | Payer: Medicare Other | Source: Ambulatory Visit | Attending: Physician Assistant | Admitting: Physician Assistant

## 2017-08-24 ENCOUNTER — Ambulatory Visit (INDEPENDENT_AMBULATORY_CARE_PROVIDER_SITE_OTHER): Payer: Medicare Other | Admitting: Physician Assistant

## 2017-08-24 VITALS — BP 138/74 | HR 78 | Ht 72.0 in | Wt 287.2 lb

## 2017-08-24 DIAGNOSIS — I480 Paroxysmal atrial fibrillation: Secondary | ICD-10-CM | POA: Insufficient documentation

## 2017-08-24 DIAGNOSIS — E785 Hyperlipidemia, unspecified: Secondary | ICD-10-CM | POA: Diagnosis not present

## 2017-08-24 DIAGNOSIS — I6529 Occlusion and stenosis of unspecified carotid artery: Secondary | ICD-10-CM | POA: Diagnosis not present

## 2017-08-24 DIAGNOSIS — I130 Hypertensive heart and chronic kidney disease with heart failure and stage 1 through stage 4 chronic kidney disease, or unspecified chronic kidney disease: Secondary | ICD-10-CM | POA: Diagnosis not present

## 2017-08-24 DIAGNOSIS — I5042 Chronic combined systolic (congestive) and diastolic (congestive) heart failure: Secondary | ICD-10-CM

## 2017-08-24 DIAGNOSIS — I2510 Atherosclerotic heart disease of native coronary artery without angina pectoris: Secondary | ICD-10-CM

## 2017-08-24 DIAGNOSIS — N183 Chronic kidney disease, stage 3 unspecified: Secondary | ICD-10-CM

## 2017-08-24 DIAGNOSIS — I10 Essential (primary) hypertension: Secondary | ICD-10-CM

## 2017-08-24 LAB — COMPREHENSIVE METABOLIC PANEL
ALBUMIN: 3.3 g/dL — AB (ref 3.5–5.0)
ALT: 16 U/L — ABNORMAL LOW (ref 17–63)
ANION GAP: 13 (ref 5–15)
AST: 20 U/L (ref 15–41)
Alkaline Phosphatase: 57 U/L (ref 38–126)
BUN: 34 mg/dL — ABNORMAL HIGH (ref 6–20)
CHLORIDE: 94 mmol/L — AB (ref 101–111)
CO2: 29 mmol/L (ref 22–32)
Calcium: 9.1 mg/dL (ref 8.9–10.3)
Creatinine, Ser: 1.3 mg/dL — ABNORMAL HIGH (ref 0.61–1.24)
GFR calc Af Amer: >60 mL/min (ref >60)
GFR calc non Af Amer: 56 mL/min — ABNORMAL LOW (ref >60)
GLUCOSE: 156 mg/dL — AB (ref 65–99)
Potassium: 4.5 mmol/L (ref 3.5–5.1)
SODIUM: 136 mmol/L (ref 135–145)
Total Bilirubin: 0.6 mg/dL (ref 0.3–1.2)
Total Protein: 7.2 g/dL (ref 6.5–8.1)

## 2017-08-24 LAB — CBC
HEMATOCRIT: 35.1 % — AB (ref 39.0–52.0)
HEMOGLOBIN: 10.8 g/dL — AB (ref 13.0–17.0)
MCH: 27.6 pg (ref 26.0–34.0)
MCHC: 30.8 g/dL (ref 30.0–36.0)
MCV: 89.8 fL (ref 78.0–100.0)
Platelets: 243 10*3/uL (ref 150–400)
RBC: 3.91 MIL/uL — ABNORMAL LOW (ref 4.22–5.81)
RDW: 15.2 % (ref 11.5–15.5)
WBC: 6.7 10*3/uL (ref 4.0–10.5)

## 2017-08-24 NOTE — Progress Notes (Signed)
Cardiology Office Note    Date:  08/24/2017  ID:  [name], DOB 1951/06/10, MRN [id number] PCP:  Jose Dunker, MD  Cardiologist:  Dr.Branch  Chief Complaint: post-hospital follow-up  History of Present Illness:  Jose [name] is a 66 y.o. male with history of morbid obesity, CAD (prior silent inferior MI, NSTEMI 07/2014 with chronically occluded RCA s/p unsuccessful PCI, STEMI 07/2017 with multivessel disease s/p failed PTCA of distal LAD, with new onset atrial fib), prior TIA 2017(hemispheric L carotid artery syndrome in setting of hypotension), acute stroke 07/2017, chronic combined CHF, COPD, DM c/b neuropathy, HTN, HLD, OSA, carotid artery disease, CKD stage III, anemia who presents for post-hospital follow-up.  To recap history, he underwent remote cath by Dr. Terrence Dupont 07/2014 revealing EF 50-55% with inferobasal wall severe hypokinesia, 40-50% ostial stenosis and 20-30% proximal stenosis, mild dz in D1/D2/Cx/OM2, 100% RCA filling by bridging collaterals). There was unsuccessful attempt at RCA PCI. He was recently admitted for prolonged admission 11/15-12/11/18 with worsening chest pain/dyspnea, found to have new onset atrial fib and acute STEMI with intial troponin of 20. He underwent emergent cardiac catheterization on 07/14/2017 which showed 70% distal left main, 100% mid to distal LAD occlusion, 70% ostial left circumflex, 100% proximal RCA. The culprit lesion was felt to be distal LAD occlusion, however PTCA was unsuccessful. 2D echo 07/14/17 showed EF 40%, akinesis of the distal inferoseptal wall and apex, aneurysmal dilatation of the apex.  He was placed on amiodarone and metoprolol for his atrial fibrillation. He was eventually placed on aspirin, Plavix and Eliquis triple therapy. Aspirin was later discontinued given the need to prolonged Plavix and Eliquis. Post hospital course was notable for AKI on CKD (peak Cr 2.25), acute on chronic CHF, altered mental status, hyponatremia, and  acute stroke. MRI of the brain obtained on the same day showed subcentimeter acute infarction in the left parietal periventricular white matter, interval occlusion of the left internal carotid artery with absence of antegrade flow at the skull base, 70% stenosis of the right vertebral artery at the foramen magnum level. Due to concern that amiodarone was contributing to the patient's weakness, this was stopped on 12/7. PT team recommended SNF but the patient wished for home with [facility] services. He was also noted to have likely chronic edema at discharge. Hydralazine was stopped due to occasional soft BPs. Notes indicate NSR at dc. Last pertinent labs include Na 129, Cr 1.59, albumin 2.8, Hgb 9.4, LDL 176, TSH wnl. Crestor titrated. No recent LFTs on file.  He returns for follow-up today with his son Jose [name]. Overall he is pleased with the progress he is making with home health. Edema improving with wrapping. No CP. Still weak, but every day feeling a little stronger. Denies any bleeding. He discontinued Crestor due to myalgias. He's not sure what other statins he's tried but is averse to considering any others.   Past Medical History:  Diagnosis Date  . Anemia   . CAD (coronary artery disease)    a. prior silent inferior MI, NSTEMI 07/2014 with chronically occluded RCA s/p unsuccessful PCI. b. STEMI 07/2017 with multivessel disease s/p failed PTCA of distal LAD.  [name] Kitchen Carotid artery disease ([place])   . Chronic combined systolic and diastolic CHF (congestive heart failure) ([place])   . Chronic respiratory failure ([place])    a. Placed on home O2 01/2016.  [name] Kitchen CKD (chronic kidney disease), stage III ([place])   . COPD (chronic obstructive pulmonary disease) ([place])   . Diabetes mellitus (  [place])   . Former tobacco use   . Hyperlipidemia   . Hypertension   . Hyponatremia   . Morbid obesity ([place])   . Neuropathy   . PAF (paroxysmal atrial fibrillation) ([place])    a. dx 06/2017.  [name] Kitchen Sleep apnea   . Stroke ([place]) 06/2017  . TIA  (transient ischemic attack) 2017   a. hemispheric L carotid artery syndrome in setting of hypotension.    Past Surgical History:  Procedure Laterality Date  . BACK SURGERY    . CORONARY BALLOON ANGIOPLASTY N/A 07/14/2017   Procedure: CORONARY BALLOON ANGIOPLASTY;  Surgeon: Martinique, Peter M, MD;  Location: [place] [facility];  Service: Cardiovascular;  Laterality: N/A;  . INTRAVASCULAR ULTRASOUND/IVUS N/A 07/14/2017   Procedure: Intravascular Ultrasound/IVUS;  Surgeon: Martinique, Peter M, MD;  Location: [place] [facility];  Service: Cardiovascular;  Laterality: N/A;  . LEFT HEART CATH AND CORONARY ANGIOGRAPHY N/A 07/14/2017   Procedure: LEFT HEART CATH AND CORONARY ANGIOGRAPHY;  Surgeon: Martinique, Peter M, MD;  Location: [place] [facility];  Service: Cardiovascular;  Laterality: N/A;  . LEFT HEART CATHETERIZATION WITH CORONARY ANGIOGRAM N/A 08/28/2014   Procedure: LEFT HEART CATHETERIZATION WITH CORONARY ANGIOGRAM;  Surgeon: Clent Demark, MD;  Location: [place] [facility];  Service: Cardiovascular;  Laterality: N/A;  . PERIPHERAL VASCULAR CATHETERIZATION Bilateral 03/22/2016   Procedure: Carotid Angiography;  Surgeon: Elam Dutch, MD;  Location: [place] [facility];  Service: Cardiovascular;  Laterality: Bilateral;    Current Medications: Current Meds  Medication Sig  . apixaban (ELIQUIS) 5 MG TABS tablet Take 1 tablet (5 mg total) by mouth 2 (two) times daily.  . clopidogrel (PLAVIX) 75 MG tablet Take 1 tablet (75 mg total) by mouth daily.  . ergocalciferol (VITAMIN D2) 50000 UNITS capsule Take 50,000 Units by mouth See admin instructions. Take one twice a week on wednesdays and saturdays  . ezetimibe (ZETIA) 10 MG tablet Take 1 tablet (10 mg total) by mouth daily.  . ferrous sulfate 325 (65 FE) MG tablet Take 325 mg by mouth 3 (three) times daily with meals.  . furosemide (LASIX) 40 MG tablet Take 40 mg by mouth 2 (two) times daily.  [name] Kitchen glipiZIDE (GLUCOTROL) 5 MG tablet Take 5 mg 2 (two) times  daily by mouth. 56m in the morning, 5347min the afternoon, 47m27mn the evening  . insulin aspart (NOVOLOG) 100 UNIT/ML injection Inject 30 Units into the skin 3 (three) times daily before meals.   . insulin glargine (LANTUS) 100 UNIT/ML injection Inject 0.3-0.6 mLs (30-60 Units total) into the skin See admin instructions. 50 units in the AM and 60 units in the PM (Patient taking differently: Inject 45-60 Units See admin instructions into the skin. 45 units in the AM and 60 units in the PM)  . isosorbide mononitrate (IMDUR) 30 MG 24 hr tablet Take 0.5 tablets (15 mg total) by mouth daily.  . magnesium oxide (MAG-OX) 400 MG tablet Take 1 tablet (400 mg total) by mouth daily.  . metFORMIN (GLUCOPHAGE) 1000 MG tablet Take 1 tablet (1,000 mg total) by mouth 2 (two) times daily with a meal.  . metoprolol succinate (TOPROL-XL) 25 MG 24 hr tablet Take 0.5 tablets (12.5 mg total) by mouth daily.  . Multiple Vitamins-Minerals (ICAPS AREDS 2 PO) Take 1 capsule by mouth 2 (two) times daily.   . mupirocin cream (BACTROBAN) 2 % Apply topically daily. (Patient taking differently: Apply topically daily as needed. )  . nitroGLYCERIN (NITROSTAT) 0.4 MG SL  tablet Place 1 tablet (0.4 mg total) under the tongue every 5 (five) minutes x 3 doses as needed for chest pain.  . Omega-3 Fatty Acids (FISH OIL) 1000 MG CAPS Take 2,000-3,000 mg See admin instructions by mouth. Take 2000 mg every morning Take 3000 mg at noon and evening.  [name] Kitchen oxyCODONE-acetaminophen (PERCOCET/ROXICET) 5-325 MG per tablet Take 2 tablets by mouth every 4 (four) hours. Take eight tablets daily as needed for pain per family and list. For bad  Back pain  . sertraline (ZOLOFT) 100 MG tablet Take 100 mg by mouth daily.  [name] Kitchen tiotropium (SPIRIVA) 18 MCG inhalation capsule Place 18 mcg into inhaler and inhale at bedtime.   . vitamin B-12 (CYANOCOBALAMIN) 1000 MCG tablet Take 1,000 mcg by mouth daily.     Allergies:   Amiodarone; Atorvastatin; and Simvastatin    Social History   Socioeconomic History  . Marital status: Married    Spouse name: None  . Number of children: None  . Years of education: None  . Highest education level: None  Social Needs  . Financial resource strain: None  . Food insecurity - worry: None  . Food insecurity - inability: None  . Transportation needs - medical: None  . Transportation needs - non-medical: None  Occupational History  . None  Tobacco Use  . Smoking status: Former Smoker    Packs/day: 3.00    Years: 45.00    Pack years: 135.00    Types: Cigarettes    Last attempt to quit: 09/24/1998    Years since quitting: 18.9  . Smokeless tobacco: Never Used  Substance and Sexual Activity  . Alcohol use: No  . Drug use: No  . Sexual activity: None  Other Topics Concern  . None  Social History Narrative  . None     Family History:  Family History  Problem Relation Age of Onset  . Hypertension Father   . Heart disease Father     ROS:   Please see the history of present illness. All other systems are reviewed and otherwise negative.    PHYSICAL EXAM:   VS:  BP 138/74 (BP Location: Left Arm)   Pulse 78   Ht 6' (1.829 m)   Wt 287 lb 3.2 oz (130.3 kg)   SpO2 94%   BMI 38.95 kg/m   BMI: Body mass index is 38.95 kg/m. GEN: Well nourished, well developed chronically ill morbidly obese WM, in no acute distress  HEENT: normocephalic, atraumatic Neck: no JVD, carotid bruits, or masses Cardiac: RRR; no murmurs, rubs, or gallops, 1+ bilateral LE edema with chronic venous stasis changes Respiratory:  clear to auscultation bilaterally, normal work of breathing GI: soft, nontender, nondistended, + BS MS: no deformity or atrophy  Skin: warm and dry, no rash, right radial cath site without hematoma or ecchymosis; good pulse. Neuro:  Alert and Oriented x 3, Strength and sensation are intact, follows commands Psych: euthymic mood, full affect  Wt Readings from Last 3 Encounters:  08/24/17 287 lb 3.2  oz (130.3 kg)  08/09/17 287 lb 9.6 oz (130.5 kg)  06/02/16 293 lb (132.9 kg)      Studies/Labs Reviewed:   EKG:  EKG was ordered today and personally reviewed by me and demonstrates NSR 92bpm, ld anteiror infarct, prior inferior Q waves, nonspecific ST-T changes, improved from prior.  Recent Labs: 07/14/2017: TSH 1.615 07/15/2017: B Natriuretic Peptide 258.5 07/24/2017: Magnesium 1.8 08/04/2017: Hemoglobin 9.4; Platelets 336 08/09/2017: BUN 36; Creatinine, Ser 1.59; Potassium 4.4; Sodium  129   Lipid Panel    Component Value Date/Time   CHOL 264 (H) 07/15/2017 0235   TRIG 255 (H) 07/15/2017 0235   HDL 37 (L) 07/15/2017 0235   CHOLHDL 7.1 07/15/2017 0235   VLDL 51 (H) 07/15/2017 0235   LDLCALC 176 (H) 07/15/2017 0235    Additional studies/ records that were reviewed today include: Summarized above.    ASSESSMENT & PLAN:   1. CAD - tolerating Plavix without difficulty. No recurrent angina. Continue present regimen. He will continue with home health PT for now.  2. Paroxysmal atrial fibrillation - maintaining NSR. He remains on apixaban. F/u CBC today. Bleeding precautions reviewed. 3. Chronic combined CHF - Reviewed 2g sodium restriction, 2L fluid restriction, daily weights with patient. BP is mildly elevated today but he reports he was previously told not to decrease BP too low due to h/o TIA from this. Continue current regimen. He weighs daily at home. Reminded him to contact office of any 3-5lb weight gain. Volume status appears stable - he always has some degree of edema and this actually appears down from when I've previously met him. 4. HTN - see above, no changes will be made today. 5. Hyperlipidemia - he declines statin. He is on Zetia. Recent LDL was uncontrolled. He gets his meds through the [place]. I am not sure if he has to go through them to consider PCSK9 therapy. His wife has a contact through the [place]. I have written down for her to ask them how he would go about the  process of discussing Repatha or Praluent. If they would allow it, would recommend lipid clinic referral at [facility] office to help manage. 6. CKD III - recheck labs today. 7. Carotid artery disease - chronically occluded L carotid noted.  Disposition: F/u with Dr. Harl Bowie in 6 weeks.  Medication Adjustments/Labs and Tests Ordered: Current medicines are reviewed at length with the patient today.  Concerns regarding medicines are outlined above. Medication changes, Labs and Tests ordered today are summarized above and listed in the Patient Instructions accessible in Encounters.   Signed, Charlie Pitter, PA-C  08/24/2017 3:45 PM    [facility] Location in [place]. [place], [place] [address] Ph: [phone]; Fax [phone]

## 2017-08-24 NOTE — Patient Instructions (Addendum)
Medication Instructions:  Your physician recommends that you continue on your current medications as directed. Please refer to the Current Medication list given to you today.   Labwork: Your physician recommends that you return for lab work: Today    Testing/Procedures: NONE   Follow-Up: Your physician recommends that you schedule a follow-up appointment in: 6 Weeks   Any Other Special Instructions Will Be Listed Below (If Applicable). Please have your wife ask [facility] contact about PCSK 9 inhibitors ( Repatha / Pralvent )    If you need a refill on your cardiac medications before your next appointment, please call your pharmacy.   For patients with congestive heart failure, we give them these special instructions:  1. Follow a low-salt diet - you are allowed no more than 2,000mg  of sodium per day. Watch your fluid intake. In general, you should not be taking in more than 2 liters of fluid per day (no more than 8 glasses per day). This includes sources of water in foods like soup, coffee, tea, milk, etc. 2. Weigh yourself on the same scale at same time of day and keep a log. 3. Call your doctor: (Anytime you feel any of the following symptoms)  - 3lb weight gain overnight or 5lb within a few days - Shortness of breath, with or without a dry hacking cough  - Swelling in the hands, feet or stomach  - If you have to sleep on extra pillows at night in order to breathe   IT IS IMPORTANT TO LET YOUR DOCTOR KNOW EARLY ON IF YOU ARE HAVING SYMPTOMS SO WE CAN HELP YOU!

## 2017-08-25 DIAGNOSIS — I25110 Atherosclerotic heart disease of native coronary artery with unstable angina pectoris: Secondary | ICD-10-CM | POA: Diagnosis not present

## 2017-08-25 DIAGNOSIS — E1122 Type 2 diabetes mellitus with diabetic chronic kidney disease: Secondary | ICD-10-CM | POA: Diagnosis not present

## 2017-08-25 DIAGNOSIS — N183 Chronic kidney disease, stage 3 (moderate): Secondary | ICD-10-CM | POA: Diagnosis not present

## 2017-08-25 DIAGNOSIS — I2102 ST elevation (STEMI) myocardial infarction involving left anterior descending coronary artery: Secondary | ICD-10-CM | POA: Diagnosis not present

## 2017-08-25 DIAGNOSIS — I130 Hypertensive heart and chronic kidney disease with heart failure and stage 1 through stage 4 chronic kidney disease, or unspecified chronic kidney disease: Secondary | ICD-10-CM | POA: Diagnosis not present

## 2017-08-25 DIAGNOSIS — I5033 Acute on chronic diastolic (congestive) heart failure: Secondary | ICD-10-CM | POA: Diagnosis not present

## 2017-08-26 DIAGNOSIS — I25110 Atherosclerotic heart disease of native coronary artery with unstable angina pectoris: Secondary | ICD-10-CM | POA: Diagnosis not present

## 2017-08-26 DIAGNOSIS — I2102 ST elevation (STEMI) myocardial infarction involving left anterior descending coronary artery: Secondary | ICD-10-CM | POA: Diagnosis not present

## 2017-08-26 DIAGNOSIS — I5033 Acute on chronic diastolic (congestive) heart failure: Secondary | ICD-10-CM | POA: Diagnosis not present

## 2017-08-26 DIAGNOSIS — E1122 Type 2 diabetes mellitus with diabetic chronic kidney disease: Secondary | ICD-10-CM | POA: Diagnosis not present

## 2017-08-26 DIAGNOSIS — N183 Chronic kidney disease, stage 3 (moderate): Secondary | ICD-10-CM | POA: Diagnosis not present

## 2017-08-26 DIAGNOSIS — I130 Hypertensive heart and chronic kidney disease with heart failure and stage 1 through stage 4 chronic kidney disease, or unspecified chronic kidney disease: Secondary | ICD-10-CM | POA: Diagnosis not present

## 2017-08-29 DIAGNOSIS — N183 Chronic kidney disease, stage 3 (moderate): Secondary | ICD-10-CM | POA: Diagnosis not present

## 2017-08-29 DIAGNOSIS — I130 Hypertensive heart and chronic kidney disease with heart failure and stage 1 through stage 4 chronic kidney disease, or unspecified chronic kidney disease: Secondary | ICD-10-CM | POA: Diagnosis not present

## 2017-08-29 DIAGNOSIS — I2102 ST elevation (STEMI) myocardial infarction involving left anterior descending coronary artery: Secondary | ICD-10-CM | POA: Diagnosis not present

## 2017-08-29 DIAGNOSIS — I25110 Atherosclerotic heart disease of native coronary artery with unstable angina pectoris: Secondary | ICD-10-CM | POA: Diagnosis not present

## 2017-08-29 DIAGNOSIS — I5033 Acute on chronic diastolic (congestive) heart failure: Secondary | ICD-10-CM | POA: Diagnosis not present

## 2017-08-29 DIAGNOSIS — E1122 Type 2 diabetes mellitus with diabetic chronic kidney disease: Secondary | ICD-10-CM | POA: Diagnosis not present

## 2017-08-30 DIAGNOSIS — I5033 Acute on chronic diastolic (congestive) heart failure: Secondary | ICD-10-CM | POA: Diagnosis not present

## 2017-08-30 DIAGNOSIS — I25110 Atherosclerotic heart disease of native coronary artery with unstable angina pectoris: Secondary | ICD-10-CM | POA: Diagnosis not present

## 2017-08-30 DIAGNOSIS — N183 Chronic kidney disease, stage 3 (moderate): Secondary | ICD-10-CM | POA: Diagnosis not present

## 2017-08-30 DIAGNOSIS — I2102 ST elevation (STEMI) myocardial infarction involving left anterior descending coronary artery: Secondary | ICD-10-CM | POA: Diagnosis not present

## 2017-08-30 DIAGNOSIS — I130 Hypertensive heart and chronic kidney disease with heart failure and stage 1 through stage 4 chronic kidney disease, or unspecified chronic kidney disease: Secondary | ICD-10-CM | POA: Diagnosis not present

## 2017-08-30 DIAGNOSIS — I6522 Occlusion and stenosis of left carotid artery: Secondary | ICD-10-CM | POA: Diagnosis not present

## 2017-08-30 DIAGNOSIS — I480 Paroxysmal atrial fibrillation: Secondary | ICD-10-CM | POA: Diagnosis not present

## 2017-08-30 DIAGNOSIS — J449 Chronic obstructive pulmonary disease, unspecified: Secondary | ICD-10-CM | POA: Diagnosis not present

## 2017-08-30 DIAGNOSIS — E1122 Type 2 diabetes mellitus with diabetic chronic kidney disease: Secondary | ICD-10-CM | POA: Diagnosis not present

## 2017-08-30 DIAGNOSIS — Z8673 Personal history of transient ischemic attack (TIA), and cerebral infarction without residual deficits: Secondary | ICD-10-CM | POA: Diagnosis not present

## 2017-09-06 DIAGNOSIS — N183 Chronic kidney disease, stage 3 (moderate): Secondary | ICD-10-CM | POA: Diagnosis not present

## 2017-09-06 DIAGNOSIS — I480 Paroxysmal atrial fibrillation: Secondary | ICD-10-CM | POA: Diagnosis not present

## 2017-09-06 DIAGNOSIS — J449 Chronic obstructive pulmonary disease, unspecified: Secondary | ICD-10-CM | POA: Diagnosis not present

## 2017-09-06 DIAGNOSIS — I130 Hypertensive heart and chronic kidney disease with heart failure and stage 1 through stage 4 chronic kidney disease, or unspecified chronic kidney disease: Secondary | ICD-10-CM | POA: Diagnosis not present

## 2017-09-06 DIAGNOSIS — I25110 Atherosclerotic heart disease of native coronary artery with unstable angina pectoris: Secondary | ICD-10-CM | POA: Diagnosis not present

## 2017-09-06 DIAGNOSIS — I6522 Occlusion and stenosis of left carotid artery: Secondary | ICD-10-CM | POA: Diagnosis not present

## 2017-09-06 DIAGNOSIS — I5033 Acute on chronic diastolic (congestive) heart failure: Secondary | ICD-10-CM | POA: Diagnosis not present

## 2017-09-06 DIAGNOSIS — I2102 ST elevation (STEMI) myocardial infarction involving left anterior descending coronary artery: Secondary | ICD-10-CM | POA: Diagnosis not present

## 2017-09-06 DIAGNOSIS — E1122 Type 2 diabetes mellitus with diabetic chronic kidney disease: Secondary | ICD-10-CM | POA: Diagnosis not present

## 2017-09-06 DIAGNOSIS — Z8673 Personal history of transient ischemic attack (TIA), and cerebral infarction without residual deficits: Secondary | ICD-10-CM | POA: Diagnosis not present

## 2017-09-08 ENCOUNTER — Encounter: Payer: Self-pay | Admitting: Family Medicine

## 2017-09-08 ENCOUNTER — Ambulatory Visit (INDEPENDENT_AMBULATORY_CARE_PROVIDER_SITE_OTHER): Payer: Medicare HMO | Admitting: Family Medicine

## 2017-09-08 VITALS — BP 139/78 | HR 92 | Temp 98.8°F | Ht 72.0 in | Wt 290.0 lb

## 2017-09-08 DIAGNOSIS — E1169 Type 2 diabetes mellitus with other specified complication: Secondary | ICD-10-CM | POA: Diagnosis not present

## 2017-09-08 DIAGNOSIS — I10 Essential (primary) hypertension: Secondary | ICD-10-CM | POA: Diagnosis not present

## 2017-09-08 DIAGNOSIS — I25110 Atherosclerotic heart disease of native coronary artery with unstable angina pectoris: Secondary | ICD-10-CM | POA: Diagnosis not present

## 2017-09-08 DIAGNOSIS — IMO0002 Reserved for concepts with insufficient information to code with codable children: Secondary | ICD-10-CM

## 2017-09-08 DIAGNOSIS — G4733 Obstructive sleep apnea (adult) (pediatric): Secondary | ICD-10-CM | POA: Diagnosis not present

## 2017-09-08 DIAGNOSIS — I252 Old myocardial infarction: Secondary | ICD-10-CM | POA: Diagnosis not present

## 2017-09-08 DIAGNOSIS — J449 Chronic obstructive pulmonary disease, unspecified: Secondary | ICD-10-CM | POA: Diagnosis not present

## 2017-09-08 DIAGNOSIS — I480 Paroxysmal atrial fibrillation: Secondary | ICD-10-CM | POA: Diagnosis not present

## 2017-09-08 DIAGNOSIS — E1159 Type 2 diabetes mellitus with other circulatory complications: Secondary | ICD-10-CM | POA: Diagnosis not present

## 2017-09-08 DIAGNOSIS — E1165 Type 2 diabetes mellitus with hyperglycemia: Secondary | ICD-10-CM

## 2017-09-08 DIAGNOSIS — N183 Chronic kidney disease, stage 3 unspecified: Secondary | ICD-10-CM

## 2017-09-08 DIAGNOSIS — I152 Hypertension secondary to endocrine disorders: Secondary | ICD-10-CM

## 2017-09-08 DIAGNOSIS — I5032 Chronic diastolic (congestive) heart failure: Secondary | ICD-10-CM

## 2017-09-08 DIAGNOSIS — E785 Hyperlipidemia, unspecified: Secondary | ICD-10-CM

## 2017-09-08 LAB — BAYER DCA HB A1C WAIVED: HB A1C: 6.6 % (ref <7.0)

## 2017-09-08 MED ORDER — ISOSORBIDE MONONITRATE ER 30 MG PO TB24: 15.0000 mg | ORAL_TABLET | Freq: Every day | ORAL | 0 refills | Status: DC

## 2017-09-08 MED ORDER — METOPROLOL SUCCINATE ER 25 MG PO TB24: 12.5000 mg | ORAL_TABLET | Freq: Every day | ORAL | 0 refills | Status: DC

## 2017-09-08 NOTE — Progress Notes (Signed)
BP 139/78   Pulse 92   Temp 98.8 F (37.1 C) (Oral)   Ht 6' (1.829 m)   Wt 290 lb (131.5 kg)   BMI 39.33 kg/m    Subjective:    Patient ID: Jose [name], male    DOB: 1951-07-07, 67 y.o.   MRN: [id number]  HPI: Jose [name] is a 67 y.o. male presenting on 09/08/2017 for Establish Care (was only going to [place] until now)   HPI Type 2 diabetes mellitus Patient comes in today for recheck of his diabetes. Patient has been currently taking glipizide and insulin, 50 units of Lantus in the a.m. and 60 units in the p.m.  He also takes NovoLog 30 units 3 times daily and metformin. Patient is not currently on an ACE inhibitor/ARB. Patient has not seen an ophthalmologist this year. Patient denies any issues with their feet.  Patient has known chronic renal disease of stage III.  Hypertension Patient is currently on metoprolol and Imdur, and their blood pressure today is 139/78. Patient denies any lightheadedness or dizziness. Patient denies headaches, blurred vision, chest pains, shortness of breath, or weakness. Denies any side effects from medication and is content with current medication.   Hyperlipidemia Patient is coming in for recheck of his hyperlipidemia. The patient is currently taking Zetia. They deny any issues with myalgias or history of liver damage from it. They deny any focal numbness or weakness or chest pain.   CHF and A. fib with CAD and history of STEMI Patient is coming in to establish care with our office and has a history of a STEMI leading to CHF and A. fib.  He says a lot of his medications are managed by the [place] system but he wanted a physician outside of the [place] system.  He denies any chest pain or shortness of breath currently.  He has swelling in both of his legs but has not had any increased swelling.  He is currently on Imdur and metoprolol and Lasix and Eliquis and Plavix.  COPD check Patient has known COPD but denies any shortness of breath currently.  He is not  currently on any inhalers.  He is a former smoker and has a 45-pack-year history but quit in 2009.  He says he gets a little shortness of breath on exertion but denies any major wheezing or nighttime coughing episodes.  Sleep apnea Patient has known sleep apnea but is not currently using the treatment but will readdress this with the [place].  Relevant past medical, surgical, family and social history reviewed and updated as indicated. Interim medical history since our last visit reviewed. Allergies and medications reviewed and updated.  Review of Systems  Constitutional: Negative for chills and fever.  HENT: Negative for congestion and sinus pressure.   Eyes: Negative for discharge.  Respiratory: Positive for cough. Negative for shortness of breath and wheezing.   Cardiovascular: Negative for chest pain and leg swelling.  Gastrointestinal: Negative for abdominal pain.  Musculoskeletal: Negative for back pain and gait problem.  Skin: Negative for rash.  Neurological: Negative for dizziness, weakness, light-headedness, numbness and headaches.  Psychiatric/Behavioral: Positive for sleep disturbance. Negative for self-injury and suicidal ideas.  All other systems reviewed and are negative.   Per HPI unless specifically indicated above  Social History   Socioeconomic History  . Marital status: Married    Spouse name: Not on file  . Number of children: Not on file  . Years of education: Not on file  .  Highest education level: Not on file  Social Needs  . Financial resource strain: Not on file  . Food insecurity - worry: Not on file  . Food insecurity - inability: Not on file  . Transportation needs - medical: Not on file  . Transportation needs - non-medical: Not on file  Occupational History  . Not on file  Tobacco Use  . Smoking status: Former Smoker    Packs/day: 3.00    Years: 45.00    Pack years: 135.00    Types: Cigarettes    Last attempt to quit: 08/31/2007    Years since  quitting: 10.0  . Smokeless tobacco: Never Used  Substance and Sexual Activity  . Alcohol use: No  . Drug use: No  . Sexual activity: Not on file  Other Topics Concern  . Not on file  Social History Narrative  . Not on file    Past Surgical History:  Procedure Laterality Date  . BACK SURGERY    . CORONARY BALLOON ANGIOPLASTY N/A 07/14/2017   Procedure: CORONARY BALLOON ANGIOPLASTY;  Surgeon: Martinique, Peter M, MD;  Location: [place] [facility];  Service: Cardiovascular;  Laterality: N/A;  . EYE SURGERY    . INTRAVASCULAR ULTRASOUND/IVUS N/A 07/14/2017   Procedure: Intravascular Ultrasound/IVUS;  Surgeon: Martinique, Peter M, MD;  Location: [facility];  Service: Cardiovascular;  Laterality: N/A;  . LEFT HEART CATH AND CORONARY ANGIOGRAPHY N/A 07/14/2017   Procedure: LEFT HEART CATH AND CORONARY ANGIOGRAPHY;  Surgeon: Martinique, Peter M, MD;  Location: [place] [facility];  Service: Cardiovascular;  Laterality: N/A;  . LEFT HEART CATHETERIZATION WITH CORONARY ANGIOGRAM N/A 08/28/2014   Procedure: LEFT HEART CATHETERIZATION WITH CORONARY ANGIOGRAM;  Surgeon: Clent Demark, MD;  Location: [facility] CATH LAB;  Service: Cardiovascular;  Laterality: N/A;  . PERIPHERAL VASCULAR CATHETERIZATION Bilateral 03/22/2016   Procedure: Carotid Angiography;  Surgeon: Elam Dutch, MD;  Location: [place] [facility] LAB;  Service: Cardiovascular;  Laterality: Bilateral;    Family History  Problem Relation Age of Onset  . Hypertension Father   . Heart disease Father   . Cancer Mother        uterine  . Diabetes Mother   . Diabetes Brother   . Cancer Brother        lymphoma  . Diabetes Brother        Objective:    BP 139/78   Pulse 92   Temp 98.8 F (37.1 C) (Oral)   Ht 6' (1.829 m)   Wt 290 lb (131.5 kg)   BMI 39.33 kg/m   Wt Readings from Last 3 Encounters:  09/08/17 290 lb (131.5 kg)  08/24/17 287 lb 3.2 oz (130.3 kg)  08/09/17 287 lb 9.6 oz (130.5 kg)    Physical Exam  Constitutional:  He is oriented to person, place, and time. He appears well-developed and well-nourished. No distress.  Eyes: Conjunctivae are normal. No scleral icterus.  Neck: Neck supple. No thyromegaly present.  Cardiovascular: Normal rate, regular rhythm, normal heart sounds and intact distal pulses.  No murmur heard. Pulmonary/Chest: Effort normal and breath sounds normal. No respiratory distress. He has no wheezes. He has no rales.  Abdominal: Soft. Bowel sounds are normal. He exhibits no distension. There is no tenderness. There is no rebound.  Musculoskeletal: Normal range of motion. He exhibits no edema.  Lymphadenopathy:    He has no cervical adenopathy.  Neurological: He is alert and oriented to person, place, and time. Coordination normal.  Skin: Skin  is warm and dry. No rash noted. He is not diaphoretic.  Psychiatric: He has a normal mood and affect. His behavior is normal.  Nursing note and vitals reviewed.     Assessment & Plan:   Problem List Items Addressed This Visit      Cardiovascular and Mediastinum   Hypertension associated with diabetes ([place])   Relevant Medications   metoprolol succinate (TOPROL-XL) 25 MG 24 hr tablet   isosorbide mononitrate (IMDUR) 30 MG 24 hr tablet   Other Relevant Orders   Lipid panel (Completed)   Uncontrolled type 2 diabetes mellitus with circulatory disorder (HCC) - Primary   Relevant Medications   metoprolol succinate (TOPROL-XL) 25 MG 24 hr tablet   isosorbide mononitrate (IMDUR) 30 MG 24 hr tablet   Other Relevant Orders   Bayer DCA Hb A1c Waived (Completed)   CMP14+EGFR (Completed)   TSH (Completed)   Chronic diastolic CHF (congestive heart failure) (HCC)   Relevant Medications   metoprolol succinate (TOPROL-XL) 25 MG 24 hr tablet   isosorbide mononitrate (IMDUR) 30 MG 24 hr tablet   Other Relevant Orders   CMP14+EGFR (Completed)   Coronary artery disease involving native coronary artery of native heart with unstable angina pectoris (HCC)     Relevant Medications   metoprolol succinate (TOPROL-XL) 25 MG 24 hr tablet   isosorbide mononitrate (IMDUR) 30 MG 24 hr tablet   PAF (paroxysmal atrial fibrillation) (HCC)   Relevant Medications   metoprolol succinate (TOPROL-XL) 25 MG 24 hr tablet   isosorbide mononitrate (IMDUR) 30 MG 24 hr tablet   Other Relevant Orders   CBC with Differential/Platelet (Completed)     Respiratory   COPD (chronic obstructive pulmonary disease) (HCC)   OSA (obstructive sleep apnea)     Endocrine   Hyperlipidemia associated with type 2 diabetes mellitus (HCC)   Relevant Medications   metoprolol succinate (TOPROL-XL) 25 MG 24 hr tablet   isosorbide mononitrate (IMDUR) 30 MG 24 hr tablet     Genitourinary   CRI (chronic renal insufficiency), stage 3 (moderate) (HCC)   Relevant Orders   CMP14+EGFR (Completed)     Other   Morbid obesity (HCC)   Relevant Orders   Lipid panel (Completed)   History of ST elevation myocardial infarction (STEMI)       Follow up plan: Return in about 3 months (around 12/07/2017), or if symptoms worsen or fail to improve, for dm, chf, htn, lipid.  Caryl Pina, MD [facility] 09/08/2017, 11:32 AM

## 2017-09-09 DIAGNOSIS — R269 Unspecified abnormalities of gait and mobility: Secondary | ICD-10-CM | POA: Diagnosis not present

## 2017-09-09 DIAGNOSIS — J449 Chronic obstructive pulmonary disease, unspecified: Secondary | ICD-10-CM | POA: Diagnosis not present

## 2017-09-09 DIAGNOSIS — I2102 ST elevation (STEMI) myocardial infarction involving left anterior descending coronary artery: Secondary | ICD-10-CM | POA: Diagnosis not present

## 2017-09-09 DIAGNOSIS — I5032 Chronic diastolic (congestive) heart failure: Secondary | ICD-10-CM | POA: Diagnosis not present

## 2017-09-09 DIAGNOSIS — I5033 Acute on chronic diastolic (congestive) heart failure: Secondary | ICD-10-CM | POA: Diagnosis not present

## 2017-09-09 LAB — CMP14+EGFR
ALK PHOS: 75 IU/L (ref 39–117)
ALT: 17 IU/L (ref 0–44)
AST: 18 IU/L (ref 0–40)
Albumin/Globulin Ratio: 1.2 (ref 1.2–2.2)
Albumin: 3.5 g/dL — ABNORMAL LOW (ref 3.6–4.8)
BUN / CREAT RATIO: 21 (ref 10–24)
BUN: 22 mg/dL (ref 8–27)
Bilirubin Total: 0.3 mg/dL (ref 0.0–1.2)
CALCIUM: 9.2 mg/dL (ref 8.6–10.2)
CO2: 29 mmol/L (ref 20–29)
CREATININE: 1.05 mg/dL (ref 0.76–1.27)
Chloride: 97 mmol/L (ref 96–106)
GFR calc Af Amer: 85 mL/min/{1.73_m2} (ref >59)
GFR, EST NON AFRICAN AMERICAN: 74 mL/min/{1.73_m2} (ref >59)
GLUCOSE: 79 mg/dL (ref 65–99)
Globulin, Total: 3 g/dL (ref 1.5–4.5)
Potassium: 4.9 mmol/L (ref 3.5–5.2)
Sodium: 141 mmol/L (ref 134–144)
TOTAL PROTEIN: 6.5 g/dL (ref 6.0–8.5)

## 2017-09-09 LAB — LIPID PANEL
CHOL/HDL RATIO: 4.6 ratio (ref 0.0–5.0)
Cholesterol, Total: 128 mg/dL (ref 100–199)
HDL: 28 mg/dL — AB (ref >39)
LDL CALC: 70 mg/dL (ref 0–99)
Triglycerides: 152 mg/dL — ABNORMAL HIGH (ref 0–149)
VLDL Cholesterol Cal: 30 mg/dL (ref 5–40)

## 2017-09-09 LAB — CBC WITH DIFFERENTIAL/PLATELET
Basophils Absolute: 0 10*3/uL (ref 0.0–0.2)
Basos: 0 %
EOS (ABSOLUTE): 0.1 10*3/uL (ref 0.0–0.4)
EOS: 1 %
HEMATOCRIT: 34.6 % — AB (ref 37.5–51.0)
HEMOGLOBIN: 11.1 g/dL — AB (ref 13.0–17.7)
IMMATURE GRANULOCYTES: 0 %
Immature Grans (Abs): 0 10*3/uL (ref 0.0–0.1)
LYMPHS ABS: 1.3 10*3/uL (ref 0.7–3.1)
Lymphs: 15 %
MCH: 26.6 pg (ref 26.6–33.0)
MCHC: 32.1 g/dL (ref 31.5–35.7)
MCV: 83 fL (ref 79–97)
MONOCYTES: 9 %
Monocytes Absolute: 0.8 10*3/uL (ref 0.1–0.9)
NEUTROS PCT: 75 %
Neutrophils Absolute: 6.5 10*3/uL (ref 1.4–7.0)
Platelets: 330 10*3/uL (ref 150–379)
RBC: 4.17 x10E6/uL (ref 4.14–5.80)
RDW: 15.4 % (ref 12.3–15.4)
WBC: 8.8 10*3/uL (ref 3.4–10.8)

## 2017-09-09 LAB — TSH: TSH: 5.17 u[IU]/mL — ABNORMAL HIGH (ref 0.450–4.500)

## 2017-09-12 ENCOUNTER — Other Ambulatory Visit: Payer: Self-pay | Admitting: *Deleted

## 2017-09-12 DIAGNOSIS — R7989 Other specified abnormal findings of blood chemistry: Secondary | ICD-10-CM

## 2017-09-15 ENCOUNTER — Encounter (INDEPENDENT_AMBULATORY_CARE_PROVIDER_SITE_OTHER): Payer: Medicare HMO | Admitting: Ophthalmology

## 2017-09-15 DIAGNOSIS — E113411 Type 2 diabetes mellitus with severe nonproliferative diabetic retinopathy with macular edema, right eye: Secondary | ICD-10-CM

## 2017-09-15 DIAGNOSIS — E11311 Type 2 diabetes mellitus with unspecified diabetic retinopathy with macular edema: Secondary | ICD-10-CM

## 2017-09-15 DIAGNOSIS — H43813 Vitreous degeneration, bilateral: Secondary | ICD-10-CM

## 2017-09-15 DIAGNOSIS — I10 Essential (primary) hypertension: Secondary | ICD-10-CM | POA: Diagnosis not present

## 2017-09-15 DIAGNOSIS — H35033 Hypertensive retinopathy, bilateral: Secondary | ICD-10-CM | POA: Diagnosis not present

## 2017-09-15 DIAGNOSIS — E113512 Type 2 diabetes mellitus with proliferative diabetic retinopathy with macular edema, left eye: Secondary | ICD-10-CM | POA: Diagnosis not present

## 2017-09-21 DIAGNOSIS — E669 Obesity, unspecified: Secondary | ICD-10-CM | POA: Diagnosis not present

## 2017-09-21 DIAGNOSIS — E782 Mixed hyperlipidemia: Secondary | ICD-10-CM | POA: Diagnosis not present

## 2017-09-21 DIAGNOSIS — K219 Gastro-esophageal reflux disease without esophagitis: Secondary | ICD-10-CM | POA: Diagnosis not present

## 2017-09-21 DIAGNOSIS — E559 Vitamin D deficiency, unspecified: Secondary | ICD-10-CM | POA: Diagnosis not present

## 2017-09-21 DIAGNOSIS — I10 Essential (primary) hypertension: Secondary | ICD-10-CM | POA: Diagnosis not present

## 2017-09-21 DIAGNOSIS — I4891 Unspecified atrial fibrillation: Secondary | ICD-10-CM | POA: Diagnosis not present

## 2017-09-21 DIAGNOSIS — E119 Type 2 diabetes mellitus without complications: Secondary | ICD-10-CM | POA: Diagnosis not present

## 2017-09-21 DIAGNOSIS — Z8673 Personal history of transient ischemic attack (TIA), and cerebral infarction without residual deficits: Secondary | ICD-10-CM | POA: Diagnosis not present

## 2017-09-24 DIAGNOSIS — Z8673 Personal history of transient ischemic attack (TIA), and cerebral infarction without residual deficits: Secondary | ICD-10-CM | POA: Diagnosis not present

## 2017-09-24 DIAGNOSIS — I10 Essential (primary) hypertension: Secondary | ICD-10-CM | POA: Diagnosis not present

## 2017-09-24 DIAGNOSIS — E559 Vitamin D deficiency, unspecified: Secondary | ICD-10-CM | POA: Diagnosis not present

## 2017-09-24 DIAGNOSIS — I4891 Unspecified atrial fibrillation: Secondary | ICD-10-CM | POA: Diagnosis not present

## 2017-09-24 DIAGNOSIS — E669 Obesity, unspecified: Secondary | ICD-10-CM | POA: Diagnosis not present

## 2017-09-24 DIAGNOSIS — E119 Type 2 diabetes mellitus without complications: Secondary | ICD-10-CM | POA: Diagnosis not present

## 2017-09-24 DIAGNOSIS — K219 Gastro-esophageal reflux disease without esophagitis: Secondary | ICD-10-CM | POA: Diagnosis not present

## 2017-09-24 DIAGNOSIS — E782 Mixed hyperlipidemia: Secondary | ICD-10-CM | POA: Diagnosis not present

## 2017-09-26 ENCOUNTER — Encounter (INDEPENDENT_AMBULATORY_CARE_PROVIDER_SITE_OTHER): Payer: Medicare HMO | Admitting: Ophthalmology

## 2017-09-26 DIAGNOSIS — K219 Gastro-esophageal reflux disease without esophagitis: Secondary | ICD-10-CM | POA: Diagnosis not present

## 2017-09-26 DIAGNOSIS — E782 Mixed hyperlipidemia: Secondary | ICD-10-CM | POA: Diagnosis not present

## 2017-09-26 DIAGNOSIS — I10 Essential (primary) hypertension: Secondary | ICD-10-CM | POA: Diagnosis not present

## 2017-09-26 DIAGNOSIS — E119 Type 2 diabetes mellitus without complications: Secondary | ICD-10-CM | POA: Diagnosis not present

## 2017-09-26 DIAGNOSIS — I4891 Unspecified atrial fibrillation: Secondary | ICD-10-CM | POA: Diagnosis not present

## 2017-09-26 DIAGNOSIS — E559 Vitamin D deficiency, unspecified: Secondary | ICD-10-CM | POA: Diagnosis not present

## 2017-09-26 DIAGNOSIS — Z8673 Personal history of transient ischemic attack (TIA), and cerebral infarction without residual deficits: Secondary | ICD-10-CM | POA: Diagnosis not present

## 2017-09-26 DIAGNOSIS — E669 Obesity, unspecified: Secondary | ICD-10-CM | POA: Diagnosis not present

## 2017-09-27 DIAGNOSIS — K219 Gastro-esophageal reflux disease without esophagitis: Secondary | ICD-10-CM | POA: Diagnosis not present

## 2017-09-27 DIAGNOSIS — E559 Vitamin D deficiency, unspecified: Secondary | ICD-10-CM | POA: Diagnosis not present

## 2017-09-27 DIAGNOSIS — E669 Obesity, unspecified: Secondary | ICD-10-CM | POA: Diagnosis not present

## 2017-09-27 DIAGNOSIS — Z8673 Personal history of transient ischemic attack (TIA), and cerebral infarction without residual deficits: Secondary | ICD-10-CM | POA: Diagnosis not present

## 2017-09-27 DIAGNOSIS — E782 Mixed hyperlipidemia: Secondary | ICD-10-CM | POA: Diagnosis not present

## 2017-09-27 DIAGNOSIS — I4891 Unspecified atrial fibrillation: Secondary | ICD-10-CM | POA: Diagnosis not present

## 2017-09-27 DIAGNOSIS — I10 Essential (primary) hypertension: Secondary | ICD-10-CM | POA: Diagnosis not present

## 2017-09-27 DIAGNOSIS — E119 Type 2 diabetes mellitus without complications: Secondary | ICD-10-CM | POA: Diagnosis not present

## 2017-09-28 DIAGNOSIS — I10 Essential (primary) hypertension: Secondary | ICD-10-CM | POA: Diagnosis not present

## 2017-09-28 DIAGNOSIS — Z8673 Personal history of transient ischemic attack (TIA), and cerebral infarction without residual deficits: Secondary | ICD-10-CM | POA: Diagnosis not present

## 2017-09-28 DIAGNOSIS — E669 Obesity, unspecified: Secondary | ICD-10-CM | POA: Diagnosis not present

## 2017-09-28 DIAGNOSIS — I4891 Unspecified atrial fibrillation: Secondary | ICD-10-CM | POA: Diagnosis not present

## 2017-09-28 DIAGNOSIS — E782 Mixed hyperlipidemia: Secondary | ICD-10-CM | POA: Diagnosis not present

## 2017-09-28 DIAGNOSIS — K219 Gastro-esophageal reflux disease without esophagitis: Secondary | ICD-10-CM | POA: Diagnosis not present

## 2017-09-28 DIAGNOSIS — E119 Type 2 diabetes mellitus without complications: Secondary | ICD-10-CM | POA: Diagnosis not present

## 2017-09-28 DIAGNOSIS — E559 Vitamin D deficiency, unspecified: Secondary | ICD-10-CM | POA: Diagnosis not present

## 2017-09-29 DIAGNOSIS — I10 Essential (primary) hypertension: Secondary | ICD-10-CM | POA: Diagnosis not present

## 2017-09-29 DIAGNOSIS — K219 Gastro-esophageal reflux disease without esophagitis: Secondary | ICD-10-CM | POA: Diagnosis not present

## 2017-09-29 DIAGNOSIS — Z8673 Personal history of transient ischemic attack (TIA), and cerebral infarction without residual deficits: Secondary | ICD-10-CM | POA: Diagnosis not present

## 2017-09-29 DIAGNOSIS — I4891 Unspecified atrial fibrillation: Secondary | ICD-10-CM | POA: Diagnosis not present

## 2017-09-29 DIAGNOSIS — E782 Mixed hyperlipidemia: Secondary | ICD-10-CM | POA: Diagnosis not present

## 2017-09-29 DIAGNOSIS — E669 Obesity, unspecified: Secondary | ICD-10-CM | POA: Diagnosis not present

## 2017-09-29 DIAGNOSIS — E559 Vitamin D deficiency, unspecified: Secondary | ICD-10-CM | POA: Diagnosis not present

## 2017-09-29 DIAGNOSIS — E119 Type 2 diabetes mellitus without complications: Secondary | ICD-10-CM | POA: Diagnosis not present

## 2017-09-30 DIAGNOSIS — E669 Obesity, unspecified: Secondary | ICD-10-CM | POA: Diagnosis not present

## 2017-09-30 DIAGNOSIS — E782 Mixed hyperlipidemia: Secondary | ICD-10-CM | POA: Diagnosis not present

## 2017-09-30 DIAGNOSIS — Z8673 Personal history of transient ischemic attack (TIA), and cerebral infarction without residual deficits: Secondary | ICD-10-CM | POA: Diagnosis not present

## 2017-09-30 DIAGNOSIS — K219 Gastro-esophageal reflux disease without esophagitis: Secondary | ICD-10-CM | POA: Diagnosis not present

## 2017-09-30 DIAGNOSIS — E559 Vitamin D deficiency, unspecified: Secondary | ICD-10-CM | POA: Diagnosis not present

## 2017-09-30 DIAGNOSIS — I10 Essential (primary) hypertension: Secondary | ICD-10-CM | POA: Diagnosis not present

## 2017-09-30 DIAGNOSIS — I4891 Unspecified atrial fibrillation: Secondary | ICD-10-CM | POA: Diagnosis not present

## 2017-09-30 DIAGNOSIS — E119 Type 2 diabetes mellitus without complications: Secondary | ICD-10-CM | POA: Diagnosis not present

## 2017-10-04 DIAGNOSIS — E119 Type 2 diabetes mellitus without complications: Secondary | ICD-10-CM | POA: Diagnosis not present

## 2017-10-04 DIAGNOSIS — K219 Gastro-esophageal reflux disease without esophagitis: Secondary | ICD-10-CM | POA: Diagnosis not present

## 2017-10-04 DIAGNOSIS — I4891 Unspecified atrial fibrillation: Secondary | ICD-10-CM | POA: Diagnosis not present

## 2017-10-04 DIAGNOSIS — I10 Essential (primary) hypertension: Secondary | ICD-10-CM | POA: Diagnosis not present

## 2017-10-04 DIAGNOSIS — E782 Mixed hyperlipidemia: Secondary | ICD-10-CM | POA: Diagnosis not present

## 2017-10-04 DIAGNOSIS — E669 Obesity, unspecified: Secondary | ICD-10-CM | POA: Diagnosis not present

## 2017-10-04 DIAGNOSIS — Z8673 Personal history of transient ischemic attack (TIA), and cerebral infarction without residual deficits: Secondary | ICD-10-CM | POA: Diagnosis not present

## 2017-10-04 DIAGNOSIS — E559 Vitamin D deficiency, unspecified: Secondary | ICD-10-CM | POA: Diagnosis not present

## 2017-10-05 ENCOUNTER — Encounter (INDEPENDENT_AMBULATORY_CARE_PROVIDER_SITE_OTHER): Payer: Medicare HMO | Admitting: Ophthalmology

## 2017-10-05 DIAGNOSIS — I4891 Unspecified atrial fibrillation: Secondary | ICD-10-CM | POA: Diagnosis not present

## 2017-10-05 DIAGNOSIS — E559 Vitamin D deficiency, unspecified: Secondary | ICD-10-CM | POA: Diagnosis not present

## 2017-10-05 DIAGNOSIS — Z8673 Personal history of transient ischemic attack (TIA), and cerebral infarction without residual deficits: Secondary | ICD-10-CM | POA: Diagnosis not present

## 2017-10-05 DIAGNOSIS — E113511 Type 2 diabetes mellitus with proliferative diabetic retinopathy with macular edema, right eye: Secondary | ICD-10-CM

## 2017-10-05 DIAGNOSIS — E11311 Type 2 diabetes mellitus with unspecified diabetic retinopathy with macular edema: Secondary | ICD-10-CM | POA: Diagnosis not present

## 2017-10-05 DIAGNOSIS — E782 Mixed hyperlipidemia: Secondary | ICD-10-CM | POA: Diagnosis not present

## 2017-10-05 DIAGNOSIS — K219 Gastro-esophageal reflux disease without esophagitis: Secondary | ICD-10-CM | POA: Diagnosis not present

## 2017-10-05 DIAGNOSIS — I10 Essential (primary) hypertension: Secondary | ICD-10-CM | POA: Diagnosis not present

## 2017-10-05 DIAGNOSIS — E669 Obesity, unspecified: Secondary | ICD-10-CM | POA: Diagnosis not present

## 2017-10-05 DIAGNOSIS — E119 Type 2 diabetes mellitus without complications: Secondary | ICD-10-CM | POA: Diagnosis not present

## 2017-10-06 DIAGNOSIS — Z8673 Personal history of transient ischemic attack (TIA), and cerebral infarction without residual deficits: Secondary | ICD-10-CM | POA: Diagnosis not present

## 2017-10-06 DIAGNOSIS — E669 Obesity, unspecified: Secondary | ICD-10-CM | POA: Diagnosis not present

## 2017-10-06 DIAGNOSIS — I4891 Unspecified atrial fibrillation: Secondary | ICD-10-CM | POA: Diagnosis not present

## 2017-10-06 DIAGNOSIS — I10 Essential (primary) hypertension: Secondary | ICD-10-CM | POA: Diagnosis not present

## 2017-10-06 DIAGNOSIS — E119 Type 2 diabetes mellitus without complications: Secondary | ICD-10-CM | POA: Diagnosis not present

## 2017-10-06 DIAGNOSIS — E782 Mixed hyperlipidemia: Secondary | ICD-10-CM | POA: Diagnosis not present

## 2017-10-06 DIAGNOSIS — E559 Vitamin D deficiency, unspecified: Secondary | ICD-10-CM | POA: Diagnosis not present

## 2017-10-06 DIAGNOSIS — K219 Gastro-esophageal reflux disease without esophagitis: Secondary | ICD-10-CM | POA: Diagnosis not present

## 2017-10-07 DIAGNOSIS — E669 Obesity, unspecified: Secondary | ICD-10-CM | POA: Diagnosis not present

## 2017-10-07 DIAGNOSIS — E119 Type 2 diabetes mellitus without complications: Secondary | ICD-10-CM | POA: Diagnosis not present

## 2017-10-07 DIAGNOSIS — K219 Gastro-esophageal reflux disease without esophagitis: Secondary | ICD-10-CM | POA: Diagnosis not present

## 2017-10-07 DIAGNOSIS — I10 Essential (primary) hypertension: Secondary | ICD-10-CM | POA: Diagnosis not present

## 2017-10-07 DIAGNOSIS — E782 Mixed hyperlipidemia: Secondary | ICD-10-CM | POA: Diagnosis not present

## 2017-10-07 DIAGNOSIS — Z8673 Personal history of transient ischemic attack (TIA), and cerebral infarction without residual deficits: Secondary | ICD-10-CM | POA: Diagnosis not present

## 2017-10-07 DIAGNOSIS — I4891 Unspecified atrial fibrillation: Secondary | ICD-10-CM | POA: Diagnosis not present

## 2017-10-07 DIAGNOSIS — E559 Vitamin D deficiency, unspecified: Secondary | ICD-10-CM | POA: Diagnosis not present

## 2017-10-08 ENCOUNTER — Other Ambulatory Visit: Payer: Self-pay | Admitting: Pediatrics

## 2017-10-08 ENCOUNTER — Other Ambulatory Visit: Payer: Self-pay | Admitting: Family Medicine

## 2017-10-08 MED ORDER — METOPROLOL SUCCINATE ER 25 MG PO TB24: 12.5000 mg | ORAL_TABLET | Freq: Every day | ORAL | 0 refills | Status: DC

## 2017-10-08 MED ORDER — ISOSORBIDE MONONITRATE ER 30 MG PO TB24: 15.0000 mg | ORAL_TABLET | Freq: Every day | ORAL | 0 refills | Status: DC

## 2017-10-12 ENCOUNTER — Ambulatory Visit (INDEPENDENT_AMBULATORY_CARE_PROVIDER_SITE_OTHER): Payer: Medicare HMO | Admitting: Family Medicine

## 2017-10-12 ENCOUNTER — Encounter: Payer: Self-pay | Admitting: Family Medicine

## 2017-10-12 VITALS — BP 151/80 | HR 97 | Temp 99.1°F | Ht 72.0 in | Wt 284.0 lb

## 2017-10-12 DIAGNOSIS — E1159 Type 2 diabetes mellitus with other circulatory complications: Secondary | ICD-10-CM | POA: Diagnosis not present

## 2017-10-12 DIAGNOSIS — R5383 Other fatigue: Secondary | ICD-10-CM | POA: Diagnosis not present

## 2017-10-12 DIAGNOSIS — G4733 Obstructive sleep apnea (adult) (pediatric): Secondary | ICD-10-CM | POA: Diagnosis not present

## 2017-10-12 DIAGNOSIS — Z794 Long term (current) use of insulin: Secondary | ICD-10-CM | POA: Diagnosis not present

## 2017-10-12 DIAGNOSIS — D509 Iron deficiency anemia, unspecified: Secondary | ICD-10-CM

## 2017-10-12 DIAGNOSIS — R7989 Other specified abnormal findings of blood chemistry: Secondary | ICD-10-CM

## 2017-10-12 MED ORDER — METOPROLOL SUCCINATE ER 25 MG PO TB24: 12.5000 mg | ORAL_TABLET | Freq: Every day | ORAL | 2 refills | Status: DC

## 2017-10-12 MED ORDER — ISOSORBIDE MONONITRATE ER 30 MG PO TB24: 15.0000 mg | ORAL_TABLET | Freq: Every day | ORAL | 2 refills | Status: DC

## 2017-10-12 NOTE — Progress Notes (Signed)
BP (!) 151/80   Pulse 97   Temp 99.1 F (37.3 C) (Oral)   Ht 6' (1.829 m)   Wt 284 lb (128.8 kg)   BMI 38.52 kg/m    Subjective:    Patient ID: Jose [name], male    DOB: 03-03-1951, 67 y.o.   MRN: [id number]  HPI: Jose [name] is a 67 y.o. male presenting on 10/12/2017 for Fatigue (recently had labs at [facility], hemoglobin was 11.1, last labwork here his TSH was off)   HPI Elevated TSH and fatigue and mild iron deficiency anemia Patient is coming in today with complaints of elevated TSH and fatigue and recheck on anemia.  He says over the past few months he is just been feeling more tired and has no energy.  He does admit also that he was an uncontrolled diabetic and that has been a lot better controlled recently with an A1c of 7.0.  He sees the [place] for these management and lab values and aside from that his hemoglobin was 11.1 on September 20, 2017 and the rest of his CBC was essentially normal.  His Kim panel including renal function was normal.  His triglycerides are borderline elevated but the rest of his cholesterol panel is normal.  His liver function was normal.  His iron was low with a normal ferritin.  Patient is currently taking 3 iron ferrous sulfate 325 mg daily.  He denies any issues with constipation does complain of some darker stools.  He does not sits worsening or getting better but just staying the same.  Relevant past medical, surgical, family and social history reviewed and updated as indicated. Interim medical history since our last visit reviewed. Allergies and medications reviewed and updated.  Review of Systems  Constitutional: Positive for fatigue. Negative for chills and fever.  Respiratory: Negative for shortness of breath and wheezing.   Cardiovascular: Negative for chest pain and leg swelling.  Gastrointestinal: Negative for abdominal pain, anal bleeding, blood in stool, constipation, diarrhea and vomiting.  Musculoskeletal: Negative for back pain and gait  problem.  Skin: Negative for rash.  Neurological: Negative for dizziness, weakness and headaches.  All other systems reviewed and are negative.   Per HPI unless specifically indicated above   Allergies as of 10/12/2017      Reactions   Amiodarone    Weakness during 2018 admission   Atorvastatin    Tremors   Simvastatin    Muscle weakness      Medication List        Accurate as of 10/12/17 10:14 AM. Always use your most recent med list.          apixaban 5 MG Tabs tablet Commonly known as:  ELIQUIS Take 1 tablet (5 mg total) by mouth 2 (two) times daily.   clopidogrel 75 MG tablet Commonly known as:  PLAVIX Take 1 tablet (75 mg total) by mouth daily.   ergocalciferol 50000 units capsule Commonly known as:  VITAMIN D2 Take 50,000 Units by mouth See admin instructions. Take one twice a week on wednesdays and saturdays   ezetimibe 10 MG tablet Commonly known as:  ZETIA Take 1 tablet (10 mg total) by mouth daily.   ferrous sulfate 325 (65 FE) MG tablet Take 325 mg by mouth 3 (three) times daily with meals.   Fish Oil 1000 MG Caps Take 2,000-3,000 mg See admin instructions by mouth. Take 2000 mg every morning Take 3000 mg at noon and evening.   furosemide 40  MG tablet Commonly known as:  LASIX Take 40 mg by mouth 2 (two) times daily.   glipiZIDE 5 MG tablet Commonly known as:  GLUCOTROL Take 5 mg by mouth 2 (two) times daily.   ICAPS AREDS 2 Caps Take 1 capsule by mouth 2 (two) times daily.   insulin aspart 100 UNIT/ML injection Commonly known as:  novoLOG Inject 30 Units into the skin 3 (three) times daily before meals.   insulin glargine 100 UNIT/ML injection Commonly known as:  LANTUS Inject 0.3-0.6 mLs (30-60 Units total) into the skin See admin instructions. 50 units in the AM and 60 units in the PM   isosorbide mononitrate 30 MG 24 hr tablet Commonly known as:  IMDUR Take 0.5 tablets (15 mg total) by mouth daily.   magnesium oxide 400 MG  tablet Commonly known as:  MAG-OX Take 1 tablet (400 mg total) by mouth daily.   metFORMIN 1000 MG tablet Commonly known as:  GLUCOPHAGE Take 1 tablet (1,000 mg total) by mouth 2 (two) times daily with a meal.   metoprolol succinate 25 MG 24 hr tablet Commonly known as:  TOPROL-XL Take 0.5 tablets (12.5 mg total) by mouth daily.   nitroGLYCERIN 0.4 MG SL tablet Commonly known as:  NITROSTAT Place 1 tablet (0.4 mg total) under the tongue every 5 (five) minutes x 3 doses as needed for chest pain.   oxyCODONE-acetaminophen 5-325 MG tablet Commonly known as:  PERCOCET/ROXICET Take 2 tablets by mouth every 6 (six) hours as needed. Take eight tablets daily as needed for pain per family and list. For bad  Back pain   sertraline 100 MG tablet Commonly known as:  ZOLOFT Take 100 mg by mouth daily.   tiotropium 18 MCG inhalation capsule Commonly known as:  SPIRIVA Place 18 mcg into inhaler and inhale at bedtime.   vitamin B-12 1000 MCG tablet Commonly known as:  CYANOCOBALAMIN Take 1,000 mcg by mouth daily.          Objective:    BP (!) 151/80   Pulse 97   Temp 99.1 F (37.3 C) (Oral)   Ht 6' (1.829 m)   Wt 284 lb (128.8 kg)   BMI 38.52 kg/m   Wt Readings from Last 3 Encounters:  10/12/17 284 lb (128.8 kg)  09/08/17 290 lb (131.5 kg)  08/24/17 287 lb 3.2 oz (130.3 kg)    Physical Exam  Constitutional: He is oriented to person, place, and time. He appears well-developed and well-nourished. No distress.  Eyes: Conjunctivae are normal. No scleral icterus.  Cardiovascular: Normal rate, regular rhythm, normal heart sounds and intact distal pulses.  No murmur heard. Pulmonary/Chest: Effort normal and breath sounds normal. No respiratory distress. He has no wheezes.  Musculoskeletal: Normal range of motion. He exhibits no edema.  Neurological: He is alert and oriented to person, place, and time. Coordination normal.  Skin: Skin is warm and dry. No rash noted. He is not  diaphoretic.  Psychiatric: He has a normal mood and affect. His behavior is normal.  Nursing note and vitals reviewed.   Results for orders placed or performed in visit on 09/08/17  Bayer DCA Hb A1c Waived  Result Value Ref Range   Bayer DCA Hb A1c Waived 6.6 <7.0 %  CMP14+EGFR  Result Value Ref Range   Glucose 79 65 - 99 mg/dL   BUN 22 8 - 27 mg/dL   Creatinine, Ser 1.05 0.76 - 1.27 mg/dL   GFR calc non Af Amer 74 >59 mL/min/1.73  GFR calc Af Amer 85 >59 mL/min/1.73   BUN/Creatinine Ratio 21 10 - 24   Sodium 141 134 - 144 mmol/L   Potassium 4.9 3.5 - 5.2 mmol/L   Chloride 97 96 - 106 mmol/L   CO2 29 20 - 29 mmol/L   Calcium 9.2 8.6 - 10.2 mg/dL   Total Protein 6.5 6.0 - 8.5 g/dL   Albumin 3.5 (L) 3.6 - 4.8 g/dL   Globulin, Total 3.0 1.5 - 4.5 g/dL   Albumin/Globulin Ratio 1.2 1.2 - 2.2   Bilirubin Total 0.3 0.0 - 1.2 mg/dL   Alkaline Phosphatase 75 39 - 117 IU/L   AST 18 0 - 40 IU/L   ALT 17 0 - 44 IU/L  CBC with Differential/Platelet  Result Value Ref Range   WBC 8.8 3.4 - 10.8 x10E3/uL   RBC 4.17 4.14 - 5.80 x10E6/uL   Hemoglobin 11.1 (L) 13.0 - 17.7 g/dL   Hematocrit 34.6 (L) 37.5 - 51.0 %   MCV 83 79 - 97 fL   MCH 26.6 26.6 - 33.0 pg   MCHC 32.1 31.5 - 35.7 g/dL   RDW 15.4 12.3 - 15.4 %   Platelets 330 150 - 379 x10E3/uL   Neutrophils 75 Not Estab. %   Lymphs 15 Not Estab. %   Monocytes 9 Not Estab. %   Eos 1 Not Estab. %   Basos 0 Not Estab. %   Neutrophils Absolute 6.5 1.4 - 7.0 x10E3/uL   Lymphocytes Absolute 1.3 0.7 - 3.1 x10E3/uL   Monocytes Absolute 0.8 0.1 - 0.9 x10E3/uL   EOS (ABSOLUTE) 0.1 0.0 - 0.4 x10E3/uL   Basophils Absolute 0.0 0.0 - 0.2 x10E3/uL   Immature Granulocytes 0 Not Estab. %   Immature Grans (Abs) 0.0 0.0 - 0.1 x10E3/uL  Lipid panel  Result Value Ref Range   Cholesterol, Total 128 100 - 199 mg/dL   Triglycerides 152 (H) 0 - 149 mg/dL   HDL 28 (L) >39 mg/dL   VLDL Cholesterol Cal 30 5 - 40 mg/dL   LDL Calculated 70 0 - 99 mg/dL    Chol/HDL Ratio 4.6 0.0 - 5.0 ratio  TSH  Result Value Ref Range   TSH 5.170 (H) 0.450 - 4.500 uIU/mL      Assessment & Plan:   Problem List Items Addressed This Visit      Respiratory   OSA (obstructive sleep apnea)     Endocrine   Type 2 diabetes mellitus, controlled (HCC)   Relevant Orders   CMP14+EGFR    Other Visit Diagnoses    Other fatigue    -  Primary   Relevant Orders   CBC with Differential/Platelet   CMP14+EGFR   Elevated TSH       Relevant Orders   Thyroid Panel With TSH   Iron deficiency anemia, unspecified iron deficiency anemia type       Relevant Orders   CBC with Differential/Platelet    Will check labs to see if we can chase down the reason that he is feeling fatigued, it is a possibility that he is just getting used to his blood sugars being more normal as he was very uncontrolled before and now is a lot more controlled with an A1c of 7.0.  Follow up plan: Return if symptoms worsen or fail to improve.  Counseling provided for all of the vaccine components Orders Placed This Encounter  Procedures  . CBC with Differential/Platelet  . CMP14+EGFR  . Thyroid Panel With TSH  Caryl Pina, MD [facility] 10/12/2017, 10:14 AM

## 2017-10-13 ENCOUNTER — Other Ambulatory Visit: Payer: Self-pay | Admitting: *Deleted

## 2017-10-13 DIAGNOSIS — E039 Hypothyroidism, unspecified: Secondary | ICD-10-CM

## 2017-10-13 LAB — THYROID PANEL WITH TSH
Free Thyroxine Index: 1.5 (ref 1.2–4.9)
T3 UPTAKE RATIO: 29 % (ref 24–39)
T4 TOTAL: 5.1 ug/dL (ref 4.5–12.0)
TSH: 6.09 u[IU]/mL — ABNORMAL HIGH (ref 0.450–4.500)

## 2017-10-13 LAB — CBC WITH DIFFERENTIAL/PLATELET
Basophils Absolute: 0 10*3/uL (ref 0.0–0.2)
Basos: 0 %
EOS (ABSOLUTE): 0.2 10*3/uL (ref 0.0–0.4)
Eos: 2 %
Hematocrit: 40.3 % (ref 37.5–51.0)
Hemoglobin: 12.5 g/dL — ABNORMAL LOW (ref 13.0–17.7)
IMMATURE GRANS (ABS): 0 10*3/uL (ref 0.0–0.1)
Immature Granulocytes: 0 %
LYMPHS: 13 %
Lymphocytes Absolute: 1.1 10*3/uL (ref 0.7–3.1)
MCH: 26.7 pg (ref 26.6–33.0)
MCHC: 31 g/dL — ABNORMAL LOW (ref 31.5–35.7)
MCV: 86 fL (ref 79–97)
MONOS ABS: 0.6 10*3/uL (ref 0.1–0.9)
Monocytes: 7 %
NEUTROS ABS: 6.4 10*3/uL (ref 1.4–7.0)
Neutrophils: 78 %
PLATELETS: 252 10*3/uL (ref 150–379)
RBC: 4.69 x10E6/uL (ref 4.14–5.80)
RDW: 15.8 % — ABNORMAL HIGH (ref 12.3–15.4)
WBC: 8.3 10*3/uL (ref 3.4–10.8)

## 2017-10-13 LAB — CMP14+EGFR
A/G RATIO: 1.1 — AB (ref 1.2–2.2)
ALBUMIN: 3.4 g/dL — AB (ref 3.6–4.8)
ALT: 17 IU/L (ref 0–44)
AST: 20 IU/L (ref 0–40)
Alkaline Phosphatase: 65 IU/L (ref 39–117)
BUN / CREAT RATIO: 16 (ref 10–24)
BUN: 17 mg/dL (ref 8–27)
Bilirubin Total: 0.2 mg/dL (ref 0.0–1.2)
CHLORIDE: 96 mmol/L (ref 96–106)
CO2: 25 mmol/L (ref 20–29)
Calcium: 9.4 mg/dL (ref 8.6–10.2)
Creatinine, Ser: 1.08 mg/dL (ref 0.76–1.27)
GFR calc non Af Amer: 71 mL/min/{1.73_m2} (ref >59)
GFR, EST AFRICAN AMERICAN: 82 mL/min/{1.73_m2} (ref >59)
Globulin, Total: 3.2 g/dL (ref 1.5–4.5)
Glucose: 127 mg/dL — ABNORMAL HIGH (ref 65–99)
POTASSIUM: 4.8 mmol/L (ref 3.5–5.2)
SODIUM: 139 mmol/L (ref 134–144)
TOTAL PROTEIN: 6.6 g/dL (ref 6.0–8.5)

## 2017-10-13 MED ORDER — LEVOTHYROXINE SODIUM 50 MCG PO TABS: 50.0000 ug | ORAL_TABLET | Freq: Every day | ORAL | 1 refills | Status: DC

## 2017-10-14 ENCOUNTER — Ambulatory Visit: Payer: Medicare HMO | Admitting: Cardiology

## 2017-10-16 LAB — HGB A1C W/O EAG: HEMOGLOBIN A1C: 7 % — AB (ref 4.8–5.6)

## 2017-10-16 LAB — SPECIMEN STATUS REPORT

## 2017-10-19 ENCOUNTER — Ambulatory Visit: Payer: Medicare HMO | Admitting: Cardiology

## 2017-10-21 ENCOUNTER — Encounter: Payer: Self-pay | Admitting: Cardiology

## 2017-10-21 ENCOUNTER — Other Ambulatory Visit: Payer: Self-pay

## 2017-10-21 ENCOUNTER — Ambulatory Visit: Payer: Medicare HMO | Admitting: Cardiology

## 2017-10-21 VITALS — BP 126/70 | HR 98 | Ht 72.0 in | Wt 289.4 lb

## 2017-10-21 DIAGNOSIS — E782 Mixed hyperlipidemia: Secondary | ICD-10-CM

## 2017-10-21 DIAGNOSIS — I480 Paroxysmal atrial fibrillation: Secondary | ICD-10-CM

## 2017-10-21 DIAGNOSIS — I2510 Atherosclerotic heart disease of native coronary artery without angina pectoris: Secondary | ICD-10-CM

## 2017-10-21 DIAGNOSIS — I10 Essential (primary) hypertension: Secondary | ICD-10-CM | POA: Diagnosis not present

## 2017-10-21 DIAGNOSIS — I5032 Chronic diastolic (congestive) heart failure: Secondary | ICD-10-CM | POA: Diagnosis not present

## 2017-10-21 NOTE — Patient Instructions (Signed)
Your physician recommends that you schedule a follow-up appointment in: 4 months with Dr Harl Bowie    INCREASE Lasix to 60 mg in the morning and 40 mg in the evening for FOUR days, then go back to regular dose of 40 mg twice a day    If you need a refill on your cardiac medications before your next appointment, please call your pharmacy.     No lab work or tests ordered today     Thank you for choosing [facility] !

## 2017-10-21 NOTE — Progress Notes (Signed)
Clinical Summary Jose [name] is a 66 y.o.male seen today for follow up of the following medical problems.   1. Chronic diastolic HF - admit late 01/2830 with acute on chronic diastolic HF - diuresed, discharge weight 315 lbs.  01/2016 echo LVEF 51-76%, grade I diastolic dysfunction    - mild SOB at times. SOme increase over the last few days - recent LE edema. Takes lasix 40mg  bid. Limiting sodium intake.  - compliant with meds - home weights around 283 lbs, fairly stable.    2. CAD -  cath 07/2014 with LM patent, LAD ostial 40-50%, LCX mld disease, RCA occluded filling by collaterals. Unsuccesful attempt at RCA PCI.  - 06/2017 cath distal LM/ostial LAD 70%, mid to distal LAD 100%, ostial LCX 70%, RCA 100%. Unsuccesful PTCA of mid to distal LAD thought to be culprit. RCA is CTO. Could not cross LCX with wire. Recs if refractory angina to consider CABG.  06/2017 echo LVEF 40%, aneurusmal apex  - no recent chest pain.   3.HTN -remains compliant with meds  4. Hyperlipidemia - compliant with statin  - 01/2016 TC 113 TG 265 HDL 26 LDL 34 - elevated TGs in setting of uncontrolled DM2.   - muscle aches on lipitor. Crestor caused significant side effects and she stoppe dtaking.  - has been on zetia 10mg  daily.  - Jan 2019 TC 128 TG 152 HDL 28 LDL 70  5. COPD - compliant with inhalers  6. OSA - compliant [facility]AP [facility]  7. CVA - admit 02/2016 with right sided weakness, received tPA.  - from prior notes higher bp goal due to severe carotid stenosis, which is followed by vascular. - most recent US shows LICA is now occluded.   - 06/2017 MRI acute left parietal CVA.   8. Afib - no recent palpitations - no recent bleeing on eliquis.   9. Carotid stenosis -- LICA occluded by cartoid [place]  10. CKD 3 - followed by pcp  11. Hypothyroidism - recent issues with hypothyroidism, started on meds per pcp   12. AAA - mild by [place] in 05/2016, requires [place]  05/2019 Past Medical History:  Diagnosis Date  . Anemia   . Anxiety   . CAD (coronary artery disease)    a. prior silent inferior MI, NSTEMI 07/2014 with chronically occluded RCA s/p unsuccessful PCI. b. STEMI 07/2017 with multivessel disease s/p failed PTCA of distal LAD.  [name] Kitchen Carotid artery disease ([place])   . Cataract   . Chronic combined systolic and diastolic CHF (congestive heart failure) ([place])   . Chronic respiratory failure ([place])    a. Placed on home O2 01/2016.  [name] Kitchen CKD (chronic kidney disease), stage III ([place])   . COPD (chronic obstructive pulmonary disease) ([place])   . Diabetes mellitus ([place])   . Former tobacco use   . Hyperlipidemia   . Hypertension   . Hyponatremia   . Morbid obesity ([place])   . Neuropathy   . PAF (paroxysmal atrial fibrillation) ([place])    a. dx 06/2017.  [name] Kitchen Sleep apnea   . Stroke ([place]) 06/2017  . TIA (transient ischemic attack) 2017   a. hemispheric L carotid artery syndrome in setting of hypotension.     Allergies  Allergen Reactions  . Amiodarone     Weakness during 2018 admission  . Atorvastatin     Tremors  . Simvastatin     Muscle weakness     Current Outpatient Medications  Medication  Sig Dispense Refill  . apixaban (ELIQUIS) 5 MG TABS tablet Take 1 tablet (5 mg total) by mouth 2 (two) times daily. 60 tablet 11  . clopidogrel (PLAVIX) 75 MG tablet Take 1 tablet (75 mg total) by mouth daily. 30 tablet 2  . ergocalciferol (VITAMIN D2) 50000 UNITS capsule Take 50,000 Units by mouth See admin instructions. Take one twice a week on wednesdays and saturdays    . ezetimibe (ZETIA) 10 MG tablet Take 1 tablet (10 mg total) by mouth daily. 30 tablet 11  . ferrous sulfate 325 (65 FE) MG tablet Take 325 mg by mouth 3 (three) times daily with meals.    . furosemide (LASIX) 40 MG tablet Take 40 mg by mouth 2 (two) times daily.    [name] Kitchen glipiZIDE (GLUCOTROL) 5 MG tablet Take 5 mg by mouth 2 (two) times daily.     . insulin aspart (NOVOLOG) 100 UNIT/ML injection  Inject 30 Units into the skin 3 (three) times daily before meals.     . insulin glargine (LANTUS) 100 UNIT/ML injection Inject 0.3-0.6 mLs (30-60 Units total) into the skin See admin instructions. 50 units in the AM and 60 units in the PM (Patient taking differently: Inject 50-60 Units into the skin See admin instructions. 50 units in the AM and 60 units in the PM) 10 mL 11  . isosorbide mononitrate (IMDUR) 30 MG 24 hr tablet Take 0.5 tablets (15 mg total) by mouth daily. 30 tablet 2  . levothyroxine (SYNTHROID, LEVOTHROID) 50 MCG tablet Take 1 tablet (50 mcg total) by mouth daily before breakfast. 90 tablet 1  . magnesium oxide (MAG-OX) 400 MG tablet Take 1 tablet (400 mg total) by mouth daily. 34 tablet 3  . metFORMIN (GLUCOPHAGE) 1000 MG tablet Take 1 tablet (1,000 mg total) by mouth 2 (two) times daily with a meal. 60 tablet 3  . metoprolol succinate (TOPROL-XL) 25 MG 24 hr tablet Take 0.5 tablets (12.5 mg total) by mouth daily. 30 tablet 2  . Multiple Vitamins-Minerals (ICAPS AREDS 2) CAPS Take 1 capsule by mouth 2 (two) times daily.    . nitroGLYCERIN (NITROSTAT) 0.4 MG SL tablet Place 1 tablet (0.4 mg total) under the tongue every 5 (five) minutes x 3 doses as needed for chest pain. 25 tablet 12  . Omega-3 Fatty Acids (FISH OIL) 1000 MG CAPS Take 2,000-3,000 mg See admin instructions by mouth. Take 2000 mg every morning Take 3000 mg at noon and evening.    [name] Kitchen oxyCODONE-acetaminophen (PERCOCET/ROXICET) 5-325 MG per tablet Take 2 tablets by mouth every 6 (six) hours as needed. Take eight tablets daily as needed for pain per family and list. For bad  Back pain    . sertraline (ZOLOFT) 100 MG tablet Take 100 mg by mouth daily.    [name] Kitchen tiotropium (SPIRIVA) 18 MCG inhalation capsule Place 18 mcg into inhaler and inhale at bedtime.     . vitamin B-12 (CYANOCOBALAMIN) 1000 MCG tablet Take 1,000 mcg by mouth daily.     No current facility-administered medications for this visit.      Past Surgical  History:  Procedure Laterality Date  . BACK SURGERY    . CORONARY BALLOON ANGIOPLASTY N/A 07/14/2017   Procedure: CORONARY BALLOON ANGIOPLASTY;  Surgeon: Martinique, Peter M, MD;  Location: [facility];  Service: Cardiovascular;  Laterality: N/A;  . EYE SURGERY    . INTRAVASCULAR ULTRASOUND/IVUS N/A 07/14/2017   Procedure: Intravascular Ultrasound/IVUS;  Surgeon: Martinique, Peter M, MD;  Location: [facility];  Service: Cardiovascular;  Laterality: N/A;  . LEFT HEART CATH AND CORONARY ANGIOGRAPHY N/A 07/14/2017   Procedure: LEFT HEART CATH AND CORONARY ANGIOGRAPHY;  Surgeon: Martinique, Peter M, MD;  Location: [facility];  Service: Cardiovascular;  Laterality: N/A;  . LEFT HEART CATHETERIZATION WITH CORONARY ANGIOGRAM N/A 08/28/2014   Procedure: LEFT HEART CATHETERIZATION WITH CORONARY ANGIOGRAM;  Surgeon: Clent Demark, MD;  Location: [facility];  Service: Cardiovascular;  Laterality: N/A;  . PERIPHERAL VASCULAR CATHETERIZATION Bilateral 03/22/2016   Procedure: Carotid Angiography;  Surgeon: Elam Dutch, MD;  Location: [facility];  Service: Cardiovascular;  Laterality: Bilateral;     Allergies  Allergen Reactions  . Amiodarone     Weakness during 2018 admission  . Atorvastatin     Tremors  . Simvastatin     Muscle weakness      Family History  Problem Relation Age of Onset  . Hypertension Father   . Heart disease Father   . Cancer Mother        uterine  . Diabetes Mother   . Diabetes Brother   . Cancer Brother        lymphoma  . Diabetes Brother      Social History Jose [name] reports that he quit smoking about 10 years ago. His smoking use included cigarettes. He has a 135.00 pack-year smoking history. he has never used smokeless tobacco. Jose [name] reports that he does not drink alcohol.   Review of Systems CONSTITUTIONAL: No weight loss, fever, chills, weakness or fatigue.  HEENT: Eyes: No visual loss, blurred vision, double vision or yellow  sclerae.No hearing loss, sneezing, congestion, runny nose or sore throat.  SKIN: No rash or itching.  CARDIOVASCULAR: per hpi RESPIRATORY: per hpi GASTROINTESTINAL: No anorexia, nausea, vomiting or diarrhea. No abdominal pain or blood.  GENITOURINARY: No burning on urination, no polyuria NEUROLOGICAL: No headache, dizziness, syncope, paralysis, ataxia, numbness or tingling in the extremities. No change in bowel or bladder control.  MUSCULOSKELETAL: No muscle, back pain, joint pain or stiffness.  LYMPHATICS: No enlarged nodes. No history of splenectomy.  PSYCHIATRIC: No history of depression or anxiety.  ENDOCRINOLOGIC: No reports of sweating, cold or heat intolerance. No polyuria or polydipsia.  [name] Kitchen   Physical Examination Vitals:   10/21/17 1518  BP: 126/70  Pulse: 98  SpO2: 90%   Vitals:   10/21/17 1518  Weight: 289 lb 6.4 oz (131.3 kg)  Height: 6' (1.829 m)    Gen: resting comfortably, no acute distress HEENT: no scleral icterus, pupils equal round and reactive, no palptable cervical adenopathy,  CV: RRR, no m/r/g, no jvd Resp: Clear to auscultation bilaterally GI: abdomen is soft, non-tender, non-distended, normal bowel sounds, no hepatosplenomegaly MSK: extremities are warm, no edema.  Skin: warm, no rash Neuro:  no focal deficits Psych: appropriate affect   Diagnostic Studies 07/2014 cath FINDINGS:LV showed inferobasal wall severe hypokinesia,EF of 50-55%. Left main was long which was patent.LAD has 40-50% ostial stenosis and 20-30% proximal stenosis.Diagonal 1 and 2 were small which had mild disease.Ramus was small which was patent.Left circumflex has mild disease.OM1 is moderate sized, which is patent.OM 2 is small but long vessel, which has mild disease.RCA was 100% occluded beyond proximal portion filling by bridging collaterals and also collaterals from the left system.  INTERVENTIONAL PROCEDURE:Attempted to pass the wire into RCA  using multiple wires i.e. ChoICE PT, [place], Runthrough, Fielder FC, and [employer].Finally Fielder XT wire could be passed up to  the mid portion of the RCA, but no balloon catheter could be advanced beyond the proximal portion, initially tried 2.5 x 12 mm long and then 1.20 x 20 mm long without success.The patient did not have any episodes of chest pain during the procedure.The patient tolerated the procedure well. There were no complications.The patient was transferred to recovery room in stable condition.Plan is to treat him medically.  06/2017 echo Study Conclusions  - Left ventricle: LVEF is approximately 40% with akinesis of the   distal inferoseptal wall and apex. Aneurysmal dilatation of apex.   Consider limited echo with contrast to evaluate for thrombus. The   cavity size was normal. Wall thickness was increased in a pattern   of mild LVH. - Mitral valve: Calcified annulus. Mildly thickened leaflets .  06/2017 cath  Dist LM to Ost LAD lesion is 70% stenosed.  Prox LAD to Mid LAD lesion is 30% stenosed.  Mid LAD to Dist LAD lesion is 100% stenosed.  Ost Cx lesion is 70% stenosed.  Prox RCA lesion is 100% stenosed.  LV end diastolic pressure is moderately elevated.  Balloon angioplasty was performed using a BALLOON EUPHORA [id number].  Post intervention, there is a 100% residual stenosis.   1. 3 vessel obstructive CAD.    - culprit lesion is occlusion of distal LAD. Unsuccessful attempt at PTCA- unable to restore flow despite balloon angioplasty.    - 70% ostial LAD with concentric calcified plaque. Minimal lumen area 5.2 mm squared by IVUS    - approximately 70% ostial LCx disease. Unable to cross with IVUS catheter.    - CTO of the proximal RCA with good left to right collaterals 2. Moderately elevated LVEDP 3. Persistent Afib   Plan: aggressive medical management. Assess LV functio by Echo. It is unclear the significance of the ostial LAD and  LCx. If he has refractory angina I would consider referral for CABG. If symptoms improve I would consider ischemic work up with a stress myoview in the near future to assess need for further revascularization.  06/2017 MRA IMPRESSION: Interval occlusion of the left internal carotid artery with absence of antegrade flow at the skullbase. Diminished but present flow in the anterior circulation on the left probably secondary to patent anterior and posterior communicating arteries.  Stenosis of the A1 segment on the right, additional importance based on the relevance to collateral flow to the left hemisphere.  Approximately 70% stenosis of the right vertebral artery at the foramen magnum level. Antegrade flow does persist. Dominant left vertebral artery freely supplies the basilar.  05/2016 AAA US IMPRESSION: Limited exam due to patient's body habitus. 3.0 cm distal abdominal aortic aneurysm is present. Recommend followup by ultrasound in 3 years. This recommendation follows ACR consensus guidelines: White Paper of the ACR Incidental Findings Committee II on Vascular Assessment and Plan   1. Chronic diastolic HF - ongoing edema, increase lasix to 60mg  in AM and 40mg  in PM x 4 days, then resume 40mg  bid.  -   2. CAD -no symptoms, continue current meds  3. HTN - bp at goal, continue current meds  4. Hyperlipidemia - counseled on diet and exercise to improve HDL and TGs, LDL at goal - intolerant to statins, continue zetia.   5. Afib - continue current meds, no symptoms. Continue anticoag   F/u 10months     Arnoldo Lenis, M.D.

## 2017-10-27 ENCOUNTER — Encounter: Payer: Self-pay | Admitting: Cardiology

## 2017-11-15 DIAGNOSIS — I10 Essential (primary) hypertension: Secondary | ICD-10-CM | POA: Diagnosis not present

## 2017-11-15 DIAGNOSIS — Z794 Long term (current) use of insulin: Secondary | ICD-10-CM | POA: Diagnosis not present

## 2017-11-15 DIAGNOSIS — E785 Hyperlipidemia, unspecified: Secondary | ICD-10-CM | POA: Diagnosis not present

## 2017-11-15 DIAGNOSIS — R531 Weakness: Secondary | ICD-10-CM | POA: Diagnosis not present

## 2017-11-15 DIAGNOSIS — E161 Other hypoglycemia: Secondary | ICD-10-CM | POA: Diagnosis not present

## 2017-11-15 DIAGNOSIS — E11649 Type 2 diabetes mellitus with hypoglycemia without coma: Secondary | ICD-10-CM | POA: Diagnosis not present

## 2017-11-15 DIAGNOSIS — Z79899 Other long term (current) drug therapy: Secondary | ICD-10-CM | POA: Diagnosis not present

## 2017-11-15 DIAGNOSIS — K219 Gastro-esophageal reflux disease without esophagitis: Secondary | ICD-10-CM | POA: Diagnosis not present

## 2017-11-15 DIAGNOSIS — R4182 Altered mental status, unspecified: Secondary | ICD-10-CM | POA: Diagnosis not present

## 2017-11-15 DIAGNOSIS — R29810 Facial weakness: Secondary | ICD-10-CM | POA: Diagnosis not present

## 2017-11-15 DIAGNOSIS — G459 Transient cerebral ischemic attack, unspecified: Secondary | ICD-10-CM | POA: Diagnosis not present

## 2017-11-15 DIAGNOSIS — Z7902 Long term (current) use of antithrombotics/antiplatelets: Secondary | ICD-10-CM | POA: Diagnosis not present

## 2017-11-15 DIAGNOSIS — I739 Peripheral vascular disease, unspecified: Secondary | ICD-10-CM | POA: Diagnosis not present

## 2017-11-15 DIAGNOSIS — R410 Disorientation, unspecified: Secondary | ICD-10-CM | POA: Diagnosis not present

## 2017-11-15 DIAGNOSIS — I6789 Other cerebrovascular disease: Secondary | ICD-10-CM | POA: Diagnosis not present

## 2017-11-15 DIAGNOSIS — G4733 Obstructive sleep apnea (adult) (pediatric): Secondary | ICD-10-CM | POA: Diagnosis not present

## 2017-11-16 ENCOUNTER — Telehealth: Payer: Self-pay | Admitting: Family Medicine

## 2017-11-16 DIAGNOSIS — K219 Gastro-esophageal reflux disease without esophagitis: Secondary | ICD-10-CM | POA: Diagnosis not present

## 2017-11-16 DIAGNOSIS — I10 Essential (primary) hypertension: Secondary | ICD-10-CM | POA: Diagnosis not present

## 2017-11-16 DIAGNOSIS — E782 Mixed hyperlipidemia: Secondary | ICD-10-CM | POA: Diagnosis not present

## 2017-11-16 DIAGNOSIS — E669 Obesity, unspecified: Secondary | ICD-10-CM | POA: Diagnosis not present

## 2017-11-16 DIAGNOSIS — E119 Type 2 diabetes mellitus without complications: Secondary | ICD-10-CM | POA: Diagnosis not present

## 2017-11-16 DIAGNOSIS — E559 Vitamin D deficiency, unspecified: Secondary | ICD-10-CM | POA: Diagnosis not present

## 2017-11-16 DIAGNOSIS — I4891 Unspecified atrial fibrillation: Secondary | ICD-10-CM | POA: Diagnosis not present

## 2017-11-16 DIAGNOSIS — Z8673 Personal history of transient ischemic attack (TIA), and cerebral infarction without residual deficits: Secondary | ICD-10-CM | POA: Diagnosis not present

## 2017-11-16 NOTE — Telephone Encounter (Signed)
Okay; thanks.

## 2017-11-16 NOTE — Telephone Encounter (Signed)
Have requested records, will give them to you as soon as they arrive.

## 2017-11-16 NOTE — Telephone Encounter (Signed)
After reviewing ER report it looks like they did know that he was off both of the others before and I would say for him for now to go ahead and take the baby aspirin along with his other 2 blood thinners and we will reevaluate when he comes in for his appointment.  Watch closely for any signs of bleeding and let us know

## 2017-11-16 NOTE — Telephone Encounter (Signed)
Patient went to [facility] ER yesterday, for stroke symptoms, was sent home and told he should take an 81 mg Aspirin along with his Plavis and Eliquis.  They are wanting to know if you agree.  Follow up appointment made with you next Wednesday 11/23/17, records requested from [place].

## 2017-11-16 NOTE — Telephone Encounter (Signed)
I would need the ER report before I could make a definitive answer

## 2017-11-16 NOTE — Telephone Encounter (Signed)
[email or web address]

## 2017-11-16 NOTE — Telephone Encounter (Signed)
I would have to see that ER visit but if he went for stroke symptoms they told him to go home with aspirin 81 then even if he is already on the other medication it would be plausible to go ahead and take it because they are increasing his stroke prevention.  It is difficult to tell with palpable ER report exactly what they are thinking but it is a possible thing to do

## 2017-11-23 ENCOUNTER — Ambulatory Visit (INDEPENDENT_AMBULATORY_CARE_PROVIDER_SITE_OTHER): Payer: Medicare HMO | Admitting: Family Medicine

## 2017-11-23 ENCOUNTER — Encounter: Payer: Self-pay | Admitting: Family Medicine

## 2017-11-23 VITALS — BP 158/78 | Temp 98.9°F | Ht 72.0 in | Wt 288.0 lb

## 2017-11-23 DIAGNOSIS — E162 Hypoglycemia, unspecified: Secondary | ICD-10-CM

## 2017-11-23 DIAGNOSIS — E785 Hyperlipidemia, unspecified: Secondary | ICD-10-CM | POA: Diagnosis not present

## 2017-11-23 DIAGNOSIS — E1159 Type 2 diabetes mellitus with other circulatory complications: Secondary | ICD-10-CM | POA: Diagnosis not present

## 2017-11-23 DIAGNOSIS — R4182 Altered mental status, unspecified: Secondary | ICD-10-CM

## 2017-11-23 DIAGNOSIS — Z8673 Personal history of transient ischemic attack (TIA), and cerebral infarction without residual deficits: Secondary | ICD-10-CM

## 2017-11-23 LAB — BAYER DCA HB A1C WAIVED: HB A1C (BAYER DCA - WAIVED): 6.4 % (ref <7.0)

## 2017-11-23 NOTE — Telephone Encounter (Signed)
Patient has apt with Dettinger today.

## 2017-11-23 NOTE — Progress Notes (Signed)
BP (!) 158/78   Temp 98.9 F (37.2 C) (Oral)   Ht 6' (1.829 m)   Wt 288 lb (130.6 kg)   BMI 39.06 kg/m    Subjective:    Patient ID: Jose [name], male    DOB: 1950-09-21, 67 y.o.   MRN: [id number]  HPI: Jose [name] is a 67 y.o. male presenting on 11/23/2017 for ER Followup   HPI Hospital follow-up for possible TIA/altered mental status/hypoglycemia Patient is coming in for hospital follow-up for TIA/hypoglycemia and altered mental status.  He was admitted to the hospital on 11/19/2017 and discharged on 11/20/2017.  He was seen at [facility] rocking him.  During that day he was sitting in a chair and might of falling asleep and then his wife when trying to budget more awake and him found out that he was very confused and had difficulty speaking and possibly a left-sided facial droop along with the confusion.  On that ER he was found to have a blood sugar of 58.  He does admit that when he fell asleep he was not using his sleep apnea machine.  He has had 1 or 2 previous episodes over the past 3 years that are been similar to this but not quite to this extent.  His wife said that the symptoms lasted about 1.5 hours and then were resolved.  He was completely resolved while still in the ER.  He was also confused during this time.  Which was also resolved.  Since this time he has not had any of the similar symptoms and he is made a point to use his sleep apnea machine even when he falls asleep for a nap on the couch or in a chair.  Relevant past medical, surgical, family and social history reviewed and updated as indicated. Interim medical history since our last visit reviewed. Allergies and medications reviewed and updated.  Review of Systems  Constitutional: Negative for chills and fever.  Eyes: Negative for visual disturbance.  Respiratory: Negative for shortness of breath and wheezing.   Cardiovascular: Negative for chest pain and leg swelling.  Musculoskeletal: Negative for back pain and gait  problem.  Skin: Negative for rash.  Neurological: Positive for facial asymmetry, speech difficulty, weakness and numbness. Negative for dizziness and light-headedness.  Psychiatric/Behavioral: Positive for confusion, decreased concentration and sleep disturbance.  All other systems reviewed and are negative.   Per HPI unless specifically indicated above   Allergies as of 11/23/2017      Reactions   Amiodarone    Weakness during 2018 admission   Atorvastatin    Tremors   Simvastatin    Muscle weakness      Medication List        Accurate as of 11/23/17  3:53 PM. Always use your most recent med list.          apixaban 5 MG Tabs tablet Commonly known as:  ELIQUIS Take 1 tablet (5 mg total) by mouth 2 (two) times daily.   clopidogrel 75 MG tablet Commonly known as:  PLAVIX Take 1 tablet (75 mg total) by mouth daily.   ergocalciferol 50000 units capsule Commonly known as:  VITAMIN D2 Take 50,000 Units by mouth See admin instructions. Take one twice a week on wednesdays and saturdays   ezetimibe 10 MG tablet Commonly known as:  ZETIA Take 1 tablet (10 mg total) by mouth daily.   ferrous sulfate 325 (65 FE) MG tablet Take 325 mg by mouth 3 (three) times  daily with meals.   Fish Oil 1000 MG Caps Take 2,000-3,000 mg See admin instructions by mouth. Take 2000 mg every morning Take 3000 mg at noon and evening.   furosemide 40 MG tablet Commonly known as:  LASIX Take 40 mg by mouth 2 (two) times daily.   glipiZIDE 5 MG tablet Commonly known as:  GLUCOTROL Take 5 mg by mouth 2 (two) times daily.   ICAPS AREDS 2 Caps Take 1 capsule by mouth 2 (two) times daily.   insulin aspart 100 UNIT/ML injection Commonly known as:  novoLOG Inject 30 Units into the skin 3 (three) times daily before meals.   insulin glargine 100 UNIT/ML injection Commonly known as:  LANTUS Inject 0.3-0.6 mLs (30-60 Units total) into the skin See admin instructions. 50 units in the AM and 60 units  in the PM   isosorbide mononitrate 30 MG 24 hr tablet Commonly known as:  IMDUR Take 0.5 tablets (15 mg total) by mouth daily.   levothyroxine 50 MCG tablet Commonly known as:  SYNTHROID, LEVOTHROID Take 1 tablet (50 mcg total) by mouth daily before breakfast.   magnesium oxide 400 MG tablet Commonly known as:  MAG-OX Take 1 tablet (400 mg total) by mouth daily.   metFORMIN 1000 MG tablet Commonly known as:  GLUCOPHAGE Take 1 tablet (1,000 mg total) by mouth 2 (two) times daily with a meal.   metoprolol succinate 25 MG 24 hr tablet Commonly known as:  TOPROL-XL Take 0.5 tablets (12.5 mg total) by mouth daily.   nitroGLYCERIN 0.4 MG SL tablet Commonly known as:  NITROSTAT Place 1 tablet (0.4 mg total) under the tongue every 5 (five) minutes x 3 doses as needed for chest pain.   oxyCODONE-acetaminophen 5-325 MG tablet Commonly known as:  PERCOCET/ROXICET Take 2 tablets by mouth every 6 (six) hours as needed. Take eight tablets daily as needed for pain per family and list. For bad  Back pain   sertraline 100 MG tablet Commonly known as:  ZOLOFT Take 100 mg by mouth daily.   tiotropium 18 MCG inhalation capsule Commonly known as:  SPIRIVA Place 18 mcg into inhaler and inhale at bedtime.   vitamin B-12 1000 MCG tablet Commonly known as:  CYANOCOBALAMIN Take 1,000 mcg by mouth daily.          Objective:    BP (!) 158/78   Temp 98.9 F (37.2 C) (Oral)   Ht 6' (1.829 m)   Wt 288 lb (130.6 kg)   BMI 39.06 kg/m   Wt Readings from Last 3 Encounters:  11/23/17 288 lb (130.6 kg)  10/21/17 289 lb 6.4 oz (131.3 kg)  10/12/17 284 lb (128.8 kg)    Physical Exam  Constitutional: He is oriented to person, place, and time. He appears well-developed and well-nourished. No distress.  Eyes: Conjunctivae are normal. No scleral icterus.  Cardiovascular: Normal rate, regular rhythm, normal heart sounds and intact distal pulses.  No murmur heard. Pulmonary/Chest: Effort normal  and breath sounds normal. No respiratory distress. He has no wheezes. He has no rales.  Neurological: He is alert and oriented to person, place, and time. No cranial nerve deficit or sensory deficit. He exhibits normal muscle tone. Coordination normal.  Skin: Skin is warm and dry. No rash noted. He is not diaphoretic.  Psychiatric: He has a normal mood and affect. His behavior is normal.  Nursing note and vitals reviewed.       Assessment & Plan:   Problem List Items Addressed This  Visit    None    Visit Diagnoses    History of TIA (transient ischemic attack)    -  Primary   Asymptomatic today after hospital follow-up.  Symptoms only lasted 2 hours on that day.  11/15/2017   Relevant Orders   US Carotid Duplex Bilateral   ECHOCARDIOGRAM LIMITED   CBC with Differential/Platelet   CMP14+EGFR   Lipid panel   Thyroid Panel With TSH   Hypoglycemia       Relevant Orders   Bayer DCA Hb A1c Waived (Completed)   Thyroid Panel With TSH   Altered mental status, unspecified altered mental status type       Patient was seen in the emergency department for hypoglycemia and altered mental status and possible TIA, had CT scan which was unchanged   Relevant Orders   US Carotid Duplex Bilateral   ECHOCARDIOGRAM LIMITED   CBC with Differential/Platelet   CMP14+EGFR   Lipid panel   Thyroid Panel With TSH       Follow up plan: Return if symptoms worsen or fail to improve.  Counseling provided for all of the vaccine components Orders Placed This Encounter  Procedures  . US Carotid Duplex Bilateral  . CBC with Differential/Platelet  . CMP14+EGFR  . Lipid panel  . Bayer DCA Hb A1c Waived  . ECHOCARDIOGRAM LIMITED    Caryl Pina, MD [facility] 11/23/2017, 3:53 PM

## 2017-11-24 LAB — SPECIMEN STATUS REPORT

## 2017-11-25 LAB — CMP14+EGFR
A/G RATIO: 1.1 — AB (ref 1.2–2.2)
ALBUMIN: 3.7 g/dL (ref 3.6–4.8)
ALT: 13 IU/L (ref 0–44)
AST: 13 IU/L (ref 0–40)
Alkaline Phosphatase: 63 IU/L (ref 39–117)
BILIRUBIN TOTAL: 0.3 mg/dL (ref 0.0–1.2)
BUN / CREAT RATIO: 19 (ref 10–24)
BUN: 25 mg/dL (ref 8–27)
CHLORIDE: 95 mmol/L — AB (ref 96–106)
CO2: 27 mmol/L (ref 20–29)
Calcium: 9.4 mg/dL (ref 8.6–10.2)
Creatinine, Ser: 1.29 mg/dL — ABNORMAL HIGH (ref 0.76–1.27)
GFR calc non Af Amer: 57 mL/min/{1.73_m2} — ABNORMAL LOW (ref >59)
GFR, EST AFRICAN AMERICAN: 66 mL/min/{1.73_m2} (ref >59)
Globulin, Total: 3.3 g/dL (ref 1.5–4.5)
Glucose: 81 mg/dL (ref 65–99)
POTASSIUM: 4.6 mmol/L (ref 3.5–5.2)
SODIUM: 140 mmol/L (ref 134–144)
Total Protein: 7 g/dL (ref 6.0–8.5)

## 2017-11-25 LAB — CBC WITH DIFFERENTIAL/PLATELET
BASOS ABS: 0 10*3/uL (ref 0.0–0.2)
BASOS: 1 %
EOS (ABSOLUTE): 0.1 10*3/uL (ref 0.0–0.4)
Eos: 2 %
HEMOGLOBIN: 12.2 g/dL — AB (ref 13.0–17.7)
Hematocrit: 39.3 % (ref 37.5–51.0)
IMMATURE GRANULOCYTES: 0 %
Immature Grans (Abs): 0 10*3/uL (ref 0.0–0.1)
Lymphocytes Absolute: 1.7 10*3/uL (ref 0.7–3.1)
Lymphs: 19 %
MCH: 26.5 pg — AB (ref 26.6–33.0)
MCHC: 31 g/dL — AB (ref 31.5–35.7)
MCV: 85 fL (ref 79–97)
MONOS ABS: 0.6 10*3/uL (ref 0.1–0.9)
Monocytes: 7 %
NEUTROS PCT: 71 %
Neutrophils Absolute: 6.1 10*3/uL (ref 1.4–7.0)
Platelets: 299 10*3/uL (ref 150–379)
RBC: 4.61 x10E6/uL (ref 4.14–5.80)
RDW: 16.3 % — ABNORMAL HIGH (ref 12.3–15.4)
WBC: 8.6 10*3/uL (ref 3.4–10.8)

## 2017-11-25 LAB — THYROID PANEL WITH TSH
Free Thyroxine Index: 1.6 (ref 1.2–4.9)
T3 UPTAKE RATIO: 28 % (ref 24–39)
T4, Total: 5.7 ug/dL (ref 4.5–12.0)
TSH: 2.31 u[IU]/mL (ref 0.450–4.500)

## 2017-11-25 LAB — LIPID PANEL
Chol/HDL Ratio: 7.9 ratio — ABNORMAL HIGH (ref 0.0–5.0)
Cholesterol, Total: 214 mg/dL — ABNORMAL HIGH (ref 100–199)
HDL: 27 mg/dL — ABNORMAL LOW (ref >39)
LDL Calculated: 124 mg/dL — ABNORMAL HIGH (ref 0–99)
Triglycerides: 315 mg/dL — ABNORMAL HIGH (ref 0–149)
VLDL Cholesterol Cal: 63 mg/dL — ABNORMAL HIGH (ref 5–40)

## 2017-11-29 ENCOUNTER — Ambulatory Visit (HOSPITAL_COMMUNITY)
Admission: RE | Admit: 2017-11-29 | Discharge: 2017-11-29 | Disposition: A | Discharge status: Home / Self Care | Payer: Medicare HMO | Source: Ambulatory Visit | Attending: Family Medicine | Admitting: Family Medicine

## 2017-11-29 ENCOUNTER — Ambulatory Visit (HOSPITAL_COMMUNITY): Admission: RE | Admit: 2017-11-29 | Payer: Medicare HMO | Source: Ambulatory Visit

## 2017-11-29 NOTE — Progress Notes (Signed)
Patient had limited echo ordered to rule out thrombus ordered by Dr.Dettinger. This was recommended in patient's echo report from July 14, 2017 at [facility]. However, patient had definity used in this echo and there was an error in reporting. I spoke with Dr. Warrick Parisian and he did not see the need for repeat echo with definity if corrected report did not recommend. Also, contacted my manager who will contact appropriate cardiologist to amend report. Explained to patient and wife. Patient not charged for visit.   Leavy Cella, RDCS

## 2017-12-08 ENCOUNTER — Ambulatory Visit: Payer: Medicare HMO | Admitting: Family Medicine

## 2017-12-09 ENCOUNTER — Ambulatory Visit (HOSPITAL_COMMUNITY): Payer: Medicare HMO

## 2017-12-14 ENCOUNTER — Other Ambulatory Visit: Payer: Self-pay | Admitting: *Deleted

## 2017-12-14 ENCOUNTER — Ambulatory Visit (HOSPITAL_COMMUNITY)
Admission: RE | Admit: 2017-12-14 | Discharge: 2017-12-14 | Disposition: A | Discharge status: Home / Self Care | Payer: Medicare HMO | Source: Ambulatory Visit | Attending: Family Medicine | Admitting: Family Medicine

## 2017-12-14 ENCOUNTER — Telehealth: Payer: Self-pay | Admitting: *Deleted

## 2017-12-14 DIAGNOSIS — R4182 Altered mental status, unspecified: Secondary | ICD-10-CM | POA: Diagnosis not present

## 2017-12-14 DIAGNOSIS — I5032 Chronic diastolic (congestive) heart failure: Secondary | ICD-10-CM

## 2017-12-14 DIAGNOSIS — I6522 Occlusion and stenosis of left carotid artery: Secondary | ICD-10-CM | POA: Insufficient documentation

## 2017-12-14 DIAGNOSIS — I252 Old myocardial infarction: Secondary | ICD-10-CM

## 2017-12-14 DIAGNOSIS — Z8673 Personal history of transient ischemic attack (TIA), and cerebral infarction without residual deficits: Secondary | ICD-10-CM

## 2017-12-14 DIAGNOSIS — I25110 Atherosclerotic heart disease of native coronary artery with unstable angina pectoris: Secondary | ICD-10-CM

## 2017-12-14 DIAGNOSIS — I6381 Other cerebral infarction due to occlusion or stenosis of small artery: Secondary | ICD-10-CM

## 2017-12-14 DIAGNOSIS — I6523 Occlusion and stenosis of bilateral carotid arteries: Secondary | ICD-10-CM | POA: Diagnosis not present

## 2017-12-14 NOTE — Telephone Encounter (Signed)
Aware.  Patient has an abnormal result and will need further testing. Vascular referral in process.

## 2017-12-14 NOTE — Telephone Encounter (Signed)
Please review Carotid US.  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

## 2017-12-14 NOTE — Telephone Encounter (Signed)
Yes patient was supposed to get a referral to vascular, I do not know if that happened

## 2017-12-14 NOTE — Telephone Encounter (Signed)
Okay these go ahead and put in the vascular, I thought this was another one that had similar previously.  And notify the patient of the results

## 2017-12-14 NOTE — Telephone Encounter (Signed)
This was a call report and patient is probably unaware of results.  No referral has been ordered for vascular test.

## 2017-12-22 ENCOUNTER — Other Ambulatory Visit: Payer: Self-pay

## 2017-12-22 DIAGNOSIS — I6523 Occlusion and stenosis of bilateral carotid arteries: Secondary | ICD-10-CM

## 2017-12-26 ENCOUNTER — Other Ambulatory Visit (HOSPITAL_COMMUNITY): Payer: Medicare HMO

## 2017-12-27 ENCOUNTER — Encounter: Payer: Self-pay | Admitting: Family Medicine

## 2017-12-27 ENCOUNTER — Ambulatory Visit (INDEPENDENT_AMBULATORY_CARE_PROVIDER_SITE_OTHER): Payer: Medicare HMO | Admitting: Family Medicine

## 2017-12-27 VITALS — BP 184/85 | HR 87 | Temp 98.4°F | Ht 72.0 in | Wt 290.2 lb

## 2017-12-27 DIAGNOSIS — I779 Disorder of arteries and arterioles, unspecified: Secondary | ICD-10-CM | POA: Diagnosis not present

## 2017-12-27 DIAGNOSIS — Z794 Long term (current) use of insulin: Secondary | ICD-10-CM

## 2017-12-27 DIAGNOSIS — E1159 Type 2 diabetes mellitus with other circulatory complications: Secondary | ICD-10-CM

## 2017-12-27 DIAGNOSIS — E1169 Type 2 diabetes mellitus with other specified complication: Secondary | ICD-10-CM

## 2017-12-27 DIAGNOSIS — E785 Hyperlipidemia, unspecified: Secondary | ICD-10-CM

## 2017-12-27 DIAGNOSIS — I10 Essential (primary) hypertension: Secondary | ICD-10-CM | POA: Diagnosis not present

## 2017-12-27 DIAGNOSIS — I739 Peripheral vascular disease, unspecified: Secondary | ICD-10-CM

## 2017-12-27 DIAGNOSIS — Z9189 Other specified personal risk factors, not elsewhere classified: Secondary | ICD-10-CM | POA: Diagnosis not present

## 2017-12-27 DIAGNOSIS — I152 Hypertension secondary to endocrine disorders: Secondary | ICD-10-CM

## 2017-12-27 LAB — BAYER DCA HB A1C WAIVED: HB A1C: 6.7 % (ref <7.0)

## 2017-12-27 MED ORDER — ISOSORBIDE MONONITRATE ER 30 MG PO TB24: 15.0000 mg | ORAL_TABLET | Freq: Every day | ORAL | 2 refills | Status: DC

## 2017-12-27 MED ORDER — METOPROLOL SUCCINATE ER 25 MG PO TB24: 12.5000 mg | ORAL_TABLET | Freq: Every day | ORAL | 2 refills | Status: DC

## 2017-12-27 NOTE — Patient Instructions (Addendum)
Discuss with cardiologist about injectable antilipid agents  Continue wound care on foot   Return in 3 months for recheck of diabetes.

## 2017-12-27 NOTE — Progress Notes (Signed)
BP (!) 184/85   Pulse 87   Temp 98.4 F (36.9 C) (Oral)   Ht 6' (1.829 m)   Wt 290 lb 3.2 oz (131.6 kg)   BMI 39.36 kg/m    Subjective:    Patient ID: Jose [name], male    DOB: 1950/10/09, 67 y.o.   MRN: [id number]  HPI: Jose [name] is a 67 y.o. male presenting on 12/27/2017 for Hypertension; Hyperlipidemia; and Diabetes   HPI Hypertension Patient is currently on Imdur and metoprolol, and their blood pressure today is 184/85, patient does admit that he did not take his medications this morning because he was fasting. Patient denies any lightheadedness or dizziness. Patient denies headaches, blurred vision, chest pains, shortness of breath, or weakness. Denies any side effects from medication and is content with current medication.   Type 2 diabetes mellitus Patient comes in today for recheck of his diabetes. Patient has been currently taking glipizide and Lantus 50 a.m. and 60 p.m and metformin. Patient is not currently on an ACE inhibitor/ARB. Patient has not seen an ophthalmologist this year. Patient denies any issues with their feet.   Hyperlipidemia Patient is coming in for recheck of his hyperlipidemia. The patient is currently taking omega-3 and Zetia, has been intolerant of statins on multiple occasions in the past. They deny any issues with myalgias or history of liver damage from it. They deny any focal numbness or weakness or chest pain.  Patient has previous history of MI, patient recently had a carotid ultrasound which showed a right-sided carotid artery disease and has a referral for vascular later this month  Morbid obesity: Discussed morbid obesity with the patient, he is trying to lose weight, has BMI of 39 with comorbidities of diabetes and hypertension and cholesterol and previous MI  Relevant past medical, surgical, family and social history reviewed and updated as indicated. Interim medical history since our last visit reviewed. Allergies and medications  reviewed and updated.  Review of Systems  Constitutional: Negative for chills and fever.  Eyes: Negative for visual disturbance.  Respiratory: Negative for shortness of breath and wheezing.   Cardiovascular: Negative for chest pain and leg swelling.  Musculoskeletal: Negative for back pain and gait problem.  Skin: Negative for rash.  Neurological: Negative for dizziness, weakness and light-headedness.  All other systems reviewed and are negative.   Per HPI unless specifically indicated above   Allergies as of 12/27/2017      Reactions   Amiodarone    Weakness during 2018 admission   Atorvastatin    Tremors   Simvastatin    Muscle weakness      Medication List        Accurate as of 12/27/17  8:42 AM. Always use your most recent med list.          apixaban 5 MG Tabs tablet Commonly known as:  ELIQUIS Take 1 tablet (5 mg total) by mouth 2 (two) times daily.   clopidogrel 75 MG tablet Commonly known as:  PLAVIX Take 1 tablet (75 mg total) by mouth daily.   ergocalciferol 50000 units capsule Commonly known as:  VITAMIN D2 Take 50,000 Units by mouth See admin instructions. Take one twice a week on wednesdays and saturdays   ezetimibe 10 MG tablet Commonly known as:  ZETIA Take 1 tablet (10 mg total) by mouth daily.   ferrous sulfate 325 (65 FE) MG tablet Take 325 mg by mouth 3 (three) times daily with meals.   Fish Oil  1000 MG Caps Take 2,000-3,000 mg See admin instructions by mouth. Take 2000 mg every morning Take 3000 mg at noon and evening.   furosemide 40 MG tablet Commonly known as:  LASIX Take 40 mg by mouth 2 (two) times daily.   glipiZIDE 5 MG tablet Commonly known as:  GLUCOTROL Take 5 mg by mouth 2 (two) times daily.   ICAPS AREDS 2 Caps Take 1 capsule by mouth 2 (two) times daily.   insulin aspart 100 UNIT/ML injection Commonly known as:  novoLOG Inject 30 Units into the skin 3 (three) times daily before meals.   insulin glargine 100 UNIT/ML  injection Commonly known as:  LANTUS Inject 0.3-0.6 mLs (30-60 Units total) into the skin See admin instructions. 50 units in the AM and 60 units in the PM   isosorbide mononitrate 30 MG 24 hr tablet Commonly known as:  IMDUR Take 0.5 tablets (15 mg total) by mouth daily.   levothyroxine 50 MCG tablet Commonly known as:  SYNTHROID, LEVOTHROID Take 1 tablet (50 mcg total) by mouth daily before breakfast.   magnesium oxide 400 MG tablet Commonly known as:  MAG-OX Take 1 tablet (400 mg total) by mouth daily.   metFORMIN 1000 MG tablet Commonly known as:  GLUCOPHAGE Take 1 tablet (1,000 mg total) by mouth 2 (two) times daily with a meal.   metoprolol succinate 25 MG 24 hr tablet Commonly known as:  TOPROL-XL Take 0.5 tablets (12.5 mg total) by mouth daily.   nitroGLYCERIN 0.4 MG SL tablet Commonly known as:  NITROSTAT Place 1 tablet (0.4 mg total) under the tongue every 5 (five) minutes x 3 doses as needed for chest pain.   oxyCODONE-acetaminophen 5-325 MG tablet Commonly known as:  PERCOCET/ROXICET Take 2 tablets by mouth every 6 (six) hours as needed. Take eight tablets daily as needed for pain per family and list. For bad  Back pain   sertraline 100 MG tablet Commonly known as:  ZOLOFT Take 100 mg by mouth daily.   tiotropium 18 MCG inhalation capsule Commonly known as:  SPIRIVA Place 18 mcg into inhaler and inhale at bedtime.   vitamin B-12 1000 MCG tablet Commonly known as:  CYANOCOBALAMIN Take 1,000 mcg by mouth daily.          Objective:    BP (!) 184/85   Pulse 87   Temp 98.4 F (36.9 C) (Oral)   Ht 6' (1.829 m)   Wt 290 lb 3.2 oz (131.6 kg)   BMI 39.36 kg/m   Wt Readings from Last 3 Encounters:  12/27/17 290 lb 3.2 oz (131.6 kg)  11/23/17 288 lb (130.6 kg)  10/21/17 289 lb 6.4 oz (131.3 kg)    Physical Exam  Constitutional: He is oriented to person, place, and time. He appears well-developed and well-nourished. No distress.  Eyes: Conjunctivae are  normal. No scleral icterus.  Neck: Neck supple. No thyromegaly present.  Cardiovascular: Normal rate, regular rhythm, normal heart sounds and intact distal pulses.  No murmur heard. Pulmonary/Chest: Effort normal and breath sounds normal. No respiratory distress. He has no wheezes.  Musculoskeletal: Normal range of motion. He exhibits no edema.  Lymphadenopathy:    He has no cervical adenopathy.  Neurological: He is alert and oriented to person, place, and time. Coordination normal.  Skin: Skin is warm and dry. No rash noted. He is not diaphoretic.  Psychiatric: He has a normal mood and affect. His behavior is normal.  Nursing note and vitals reviewed.   Results for  orders placed or performed in visit on 11/23/17  CBC with Differential/Platelet  Result Value Ref Range   WBC 8.6 3.4 - 10.8 x10E3/uL   RBC 4.61 4.14 - 5.80 x10E6/uL   Hemoglobin 12.2 (L) 13.0 - 17.7 g/dL   Hematocrit 39.3 37.5 - 51.0 %   MCV 85 79 - 97 fL   MCH 26.5 (L) 26.6 - 33.0 pg   MCHC 31.0 (L) 31.5 - 35.7 g/dL   RDW 16.3 (H) 12.3 - 15.4 %   Platelets 299 150 - 379 x10E3/uL   Neutrophils 71 Not Estab. %   Lymphs 19 Not Estab. %   Monocytes 7 Not Estab. %   Eos 2 Not Estab. %   Basos 1 Not Estab. %   Neutrophils Absolute 6.1 1.4 - 7.0 x10E3/uL   Lymphocytes Absolute 1.7 0.7 - 3.1 x10E3/uL   Monocytes Absolute 0.6 0.1 - 0.9 x10E3/uL   EOS (ABSOLUTE) 0.1 0.0 - 0.4 x10E3/uL   Basophils Absolute 0.0 0.0 - 0.2 x10E3/uL   Immature Granulocytes 0 Not Estab. %   Immature Grans (Abs) 0.0 0.0 - 0.1 x10E3/uL  CMP14+EGFR  Result Value Ref Range   Glucose 81 65 - 99 mg/dL   BUN 25 8 - 27 mg/dL   Creatinine, Ser 1.29 (H) 0.76 - 1.27 mg/dL   GFR calc non Af Amer 57 (L) >59 mL/min/1.73   GFR calc Af Amer 66 >59 mL/min/1.73   BUN/Creatinine Ratio 19 10 - 24   Sodium 140 134 - 144 mmol/L   Potassium 4.6 3.5 - 5.2 mmol/L   Chloride 95 (L) 96 - 106 mmol/L   CO2 27 20 - 29 mmol/L   Calcium 9.4 8.6 - 10.2 mg/dL   Total  Protein 7.0 6.0 - 8.5 g/dL   Albumin 3.7 3.6 - 4.8 g/dL   Globulin, Total 3.3 1.5 - 4.5 g/dL   Albumin/Globulin Ratio 1.1 (L) 1.2 - 2.2   Bilirubin Total 0.3 0.0 - 1.2 mg/dL   Alkaline Phosphatase 63 39 - 117 IU/L   AST 13 0 - 40 IU/L   ALT 13 0 - 44 IU/L  Lipid panel  Result Value Ref Range   Cholesterol, Total 214 (H) 100 - 199 mg/dL   Triglycerides 315 (H) 0 - 149 mg/dL   HDL 27 (L) >39 mg/dL   VLDL Cholesterol Cal 63 (H) 5 - 40 mg/dL   LDL Calculated 124 (H) 0 - 99 mg/dL   Chol/HDL Ratio 7.9 (H) 0.0 - 5.0 ratio  Bayer DCA Hb A1c Waived  Result Value Ref Range   Bayer DCA Hb A1c Waived 6.4 <7.0 %  Thyroid Panel With TSH  Result Value Ref Range   TSH 2.310 0.450 - 4.500 uIU/mL   T4, Total 5.7 4.5 - 12.0 ug/dL   T3 Uptake Ratio 28 24 - 39 %   Free Thyroxine Index 1.6 1.2 - 4.9  Specimen status report  Result Value Ref Range   specimen status report Comment    Diabetic Foot Exam - Simple   Simple Foot Form Diabetic Foot exam was performed with the following findings:  Yes 12/27/2017  8:41 AM  Visual Inspection See comments:  Yes Sensation Testing See comments:  Yes Pulse Check Posterior Tibialis and Dorsalis pulse intact bilaterally:  Yes Comments Small healing ulceration that they are using a honey treatment from the [place].  About the size of a pencil eraser now but they said it was much bigger before.  Decreased sensation throughout both feet  Onychomycosis        Assessment & Plan:   Problem List Items Addressed This Visit      Cardiovascular and Mediastinum   Hypertension associated with diabetes ([place])   Relevant Medications   metoprolol succinate (TOPROL-XL) 25 MG 24 hr tablet   isosorbide mononitrate (IMDUR) 30 MG 24 hr tablet   Other Relevant Orders   Microalbumin / creatinine urine ratio   CMP14+EGFR   Carotid artery disease ([facility])   Relevant Medications   metoprolol succinate (TOPROL-XL) 25 MG 24 hr tablet   isosorbide mononitrate (IMDUR) 30 MG 24 hr  tablet     Endocrine   Hyperlipidemia associated with type 2 diabetes mellitus (H[facility])   Relevant Medications   metoprolol succinate (TOPROL-XL) 25 MG 24 hr tablet   isosorbide mononitrate (IMDUR) 30 MG 24 hr tablet   Type 2 diabetes mellitus, controlled ([place]) - Primary   Relevant Orders   Microalbumin / creatinine urine ratio   CMP14+EGFR   Bayer DCA Hb A1c Waived (Completed)     Other   Morbid obesity ([place])   Atherosclerotic cardiovascular disease 10 year risk greater than 30% using 2013 ACC/AHA calculator         Follow up plan: Return in about 3 months (around 03/28/2018), or if symptoms worsen or fail to improve.  Counseling provided for all of the vaccine components Orders Placed This Encounter  Procedures  . Microalbumin / creatinine urine ratio  . CMP14+EGFR  . Bayer [facility] Hb A1c [place], MD [facility] 12/27/2017, 8:42 AM

## 2017-12-28 LAB — CMP14+EGFR
ALBUMIN: 3.7 g/dL (ref 3.6–4.8)
ALK PHOS: 58 IU/L (ref 39–117)
ALT: 14 IU/L (ref 0–44)
AST: 17 IU/L (ref 0–40)
Albumin/Globulin Ratio: 1.1 — ABNORMAL LOW (ref 1.2–2.2)
BILIRUBIN TOTAL: 0.2 mg/dL (ref 0.0–1.2)
BUN/Creatinine Ratio: 21 (ref 10–24)
BUN: 27 mg/dL (ref 8–27)
CO2: 25 mmol/L (ref 20–29)
Calcium: 9.4 mg/dL (ref 8.6–10.2)
Chloride: 97 mmol/L (ref 96–106)
Creatinine, Ser: 1.3 mg/dL — ABNORMAL HIGH (ref 0.76–1.27)
GFR calc Af Amer: 66 mL/min/{1.73_m2} (ref >59)
GFR calc non Af Amer: 57 mL/min/{1.73_m2} — ABNORMAL LOW (ref >59)
GLUCOSE: 147 mg/dL — AB (ref 65–99)
Globulin, Total: 3.3 g/dL (ref 1.5–4.5)
Potassium: 4.9 mmol/L (ref 3.5–5.2)
Sodium: 141 mmol/L (ref 134–144)
TOTAL PROTEIN: 7 g/dL (ref 6.0–8.5)

## 2017-12-28 LAB — MICROALBUMIN / CREATININE URINE RATIO
CREATININE, UR: 41.7 mg/dL
Microalb/Creat Ratio: 7430.9 mg/g creat — ABNORMAL HIGH (ref 0.0–30.0)
Microalbumin, Urine: 3098.7 ug/mL

## 2018-01-04 ENCOUNTER — Ambulatory Visit (HOSPITAL_COMMUNITY)
Admission: RE | Admit: 2018-01-04 | Discharge: 2018-01-04 | Disposition: A | Discharge status: Home / Self Care | Payer: Medicare HMO | Source: Ambulatory Visit | Attending: Surgery | Admitting: Surgery

## 2018-01-04 ENCOUNTER — Encounter: Payer: Self-pay | Admitting: Surgery

## 2018-01-04 ENCOUNTER — Telehealth: Payer: Self-pay | Admitting: Cardiology

## 2018-01-04 ENCOUNTER — Ambulatory Visit (INDEPENDENT_AMBULATORY_CARE_PROVIDER_SITE_OTHER): Payer: Medicare HMO | Admitting: Family Medicine

## 2018-01-04 ENCOUNTER — Ambulatory Visit: Payer: Medicare HMO | Admitting: Surgery

## 2018-01-04 ENCOUNTER — Encounter: Payer: Self-pay | Admitting: Family Medicine

## 2018-01-04 ENCOUNTER — Telehealth: Payer: Self-pay

## 2018-01-04 VITALS — BP 176/85 | HR 95 | Temp 97.0°F | Resp 19 | Ht 72.0 in | Wt 292.0 lb

## 2018-01-04 VITALS — BP 133/68 | HR 100 | Temp 98.1°F | Ht 72.0 in | Wt 292.0 lb

## 2018-01-04 DIAGNOSIS — Z87891 Personal history of nicotine dependence: Secondary | ICD-10-CM | POA: Diagnosis not present

## 2018-01-04 DIAGNOSIS — E119 Type 2 diabetes mellitus without complications: Secondary | ICD-10-CM | POA: Insufficient documentation

## 2018-01-04 DIAGNOSIS — I6522 Occlusion and stenosis of left carotid artery: Secondary | ICD-10-CM | POA: Diagnosis not present

## 2018-01-04 DIAGNOSIS — E785 Hyperlipidemia, unspecified: Secondary | ICD-10-CM | POA: Insufficient documentation

## 2018-01-04 DIAGNOSIS — I6523 Occlusion and stenosis of bilateral carotid arteries: Secondary | ICD-10-CM | POA: Diagnosis not present

## 2018-01-04 DIAGNOSIS — I2510 Atherosclerotic heart disease of native coronary artery without angina pectoris: Secondary | ICD-10-CM | POA: Insufficient documentation

## 2018-01-04 DIAGNOSIS — R040 Epistaxis: Secondary | ICD-10-CM | POA: Diagnosis not present

## 2018-01-04 DIAGNOSIS — Z7901 Long term (current) use of anticoagulants: Secondary | ICD-10-CM | POA: Diagnosis not present

## 2018-01-04 DIAGNOSIS — I10 Essential (primary) hypertension: Secondary | ICD-10-CM | POA: Diagnosis not present

## 2018-01-04 LAB — HEMOGLOBIN, FINGERSTICK: HEMOGLOBIN: 12.2 g/dL — AB (ref 12.6–17.7)

## 2018-01-04 NOTE — Telephone Encounter (Signed)
Wife states that patient had a 2 hour nose bleed yesterday and has one today since 7am. Patient is on blood thinners. Spoke with Dr. Warrick Parisian and states as long as patient was not gushing blood he could come in today to be seen. Apt made. Patient aware of starts gushing go to the ER.

## 2018-01-04 NOTE — Progress Notes (Signed)
Subjective: CC: nose bleed PCP: Dettinger, Fransisca Kaufmann, MD JHE:RDEYCX Chauncey Cruel Jose [name] is a 67 y.o. male presenting to clinic today for:  1. Nose bleed Patient reports that he has had 2 nosebleeds over the last 24 hours each lasting about 3 hours per episode.  He reports that this was continuous nosebleed.  Last episode was this morning at 7 AM and lasted until 11 AM.  It is the right nare that is bleeding.  He is currently anticoagulated with Eliquis and on Plavix for history of CAD, PAF and carotid artery disease.  He also has a past medical history of lacunar infarction and CKD 3.  He denies dizziness, lightheadedness, chest pain, shortness of breath, heart palpitations.  His wife does feel like he looks a little paler than normal.   ROS: Per HPI  Allergies  Allergen Reactions  . Amiodarone     Weakness during 2018 admission  . Atorvastatin     Tremors  . Simvastatin     Muscle weakness   Past Medical History:  Diagnosis Date  . Anemia   . Anxiety   . CAD (coronary artery disease)    a. prior silent inferior MI, NSTEMI 07/2014 with chronically occluded RCA s/p unsuccessful PCI. b. STEMI 07/2017 with multivessel disease s/p failed PTCA of distal LAD.  [name] Kitchen Carotid artery disease ([place])   . Cataract   . Chronic combined systolic and diastolic CHF (congestive heart failure) ([place])   . Chronic respiratory failure ([place])    a. Placed on home O2 01/2016.  [name] Kitchen CKD (chronic kidney disease), stage III ([place])   . COPD (chronic obstructive pulmonary disease) ([place])   . Diabetes mellitus ([place])   . Former tobacco use   . Hyperlipidemia   . Hypertension   . Hyponatremia   . Morbid obesity ([place])   . Neuropathy   . PAF (paroxysmal atrial fibrillation) ([place])    a. dx 06/2017.  [name] Kitchen Sleep apnea   . Stroke ([place]) 06/2017  . TIA (transient ischemic attack) 2017   a. hemispheric L carotid artery syndrome in setting of hypotension.    Current Outpatient Medications:  .  apixaban (ELIQUIS) 5 MG TABS tablet,  Take 1 tablet (5 mg total) by mouth 2 (two) times daily., Disp: 60 tablet, Rfl: 11 .  clopidogrel (PLAVIX) 75 MG tablet, Take 1 tablet (75 mg total) by mouth daily., Disp: 30 tablet, Rfl: 2 .  ergocalciferol (VITAMIN D2) 50000 UNITS capsule, Take 50,000 Units by mouth See admin instructions. Take one twice a week on wednesdays and saturdays, Disp: , Rfl:  .  ezetimibe (ZETIA) 10 MG tablet, Take 1 tablet (10 mg total) by mouth daily., Disp: 30 tablet, Rfl: 11 .  ferrous sulfate 325 (65 FE) MG tablet, Take 325 mg by mouth 3 (three) times daily with meals., Disp: , Rfl:  .  furosemide (LASIX) 40 MG tablet, Take 40 mg by mouth 2 (two) times daily., Disp: , Rfl:  .  glipiZIDE (GLUCOTROL) 5 MG tablet, Take 5 mg by mouth 2 (two) times daily. , Disp: , Rfl:  .  insulin aspart (NOVOLOG) 100 UNIT/ML injection, Inject 30 Units into the skin 3 (three) times daily before meals. , Disp: , Rfl:  .  insulin glargine (LANTUS) 100 UNIT/ML injection, Inject 0.3-0.6 mLs (30-60 Units total) into the skin See admin instructions. 50 units in the AM and 60 units in the PM (Patient taking differently: Inject 50-60 Units into the skin See admin instructions. 50 units in the  AM and 60 units in the PM), Disp: 10 mL, Rfl: 11 .  isosorbide mononitrate (IMDUR) 30 MG 24 hr tablet, Take 0.5 tablets (15 mg total) by mouth daily., Disp: 30 tablet, Rfl: 2 .  levothyroxine (SYNTHROID, LEVOTHROID) 50 MCG tablet, Take 1 tablet (50 mcg total) by mouth daily before breakfast., Disp: 90 tablet, Rfl: 1 .  magnesium oxide (MAG-OX) 400 MG tablet, Take 1 tablet (400 mg total) by mouth daily., Disp: 34 tablet, Rfl: 3 .  metFORMIN (GLUCOPHAGE) 1000 MG tablet, Take 1 tablet (1,000 mg total) by mouth 2 (two) times daily with a meal., Disp: 60 tablet, Rfl: 3 .  metoprolol succinate (TOPROL-XL) 25 MG 24 hr tablet, Take 0.5 tablets (12.5 mg total) by mouth daily., Disp: 30 tablet, Rfl: 2 .  Multiple Vitamins-Minerals (ICAPS AREDS 2) CAPS, Take 1 capsule  by mouth 2 (two) times daily., Disp: , Rfl:  .  nitroGLYCERIN (NITROSTAT) 0.4 MG SL tablet, Place 1 tablet (0.4 mg total) under the tongue every 5 (five) minutes x 3 doses as needed for chest pain., Disp: 25 tablet, Rfl: 12 .  Omega-3 Fatty Acids (FISH OIL) 1000 MG CAPS, Take 2,000-3,000 mg See admin instructions by mouth. Take 2000 mg every morning Take 3000 mg at noon and evening., Disp: , Rfl:  .  oxyCODONE-acetaminophen (PERCOCET/ROXICET) 5-325 MG per tablet, Take 2 tablets by mouth every 6 (six) hours as needed. Take eight tablets daily as needed for pain per family and list. For bad  Back pain, Disp: , Rfl:  .  sertraline (ZOLOFT) 100 MG tablet, Take 100 mg by mouth daily., Disp: , Rfl:  .  tiotropium (SPIRIVA) 18 MCG inhalation capsule, Place 18 mcg into inhaler and inhale at bedtime. , Disp: , Rfl:  .  vitamin B-12 (CYANOCOBALAMIN) 1000 MCG tablet, Take 1,000 mcg by mouth daily., Disp: , Rfl:  Social History   Socioeconomic History  . Marital status: Married    Spouse name: Not on file  . Number of children: Not on file  . Years of education: Not on file  . Highest education level: Not on file  Occupational History  . Not on file  Social Needs  . Financial resource strain: Not on file  . Food insecurity:    Worry: Not on file    Inability: Not on file  . Transportation needs:    Medical: Not on file    Non-medical: Not on file  Tobacco Use  . Smoking status: Former Smoker    Packs/day: 3.00    Years: 45.00    Pack years: 135.00    Types: Cigarettes    Last attempt to quit: 08/31/2007    Years since quitting: 10.3  . Smokeless tobacco: Never Used  Substance and Sexual Activity  . Alcohol use: No  . Drug use: No  . Sexual activity: Not on file  Lifestyle  . Physical activity:    Days per week: Not on file    Minutes per session: Not on file  . Stress: Not on file  Relationships  . Social connections:    Talks on phone: Not on file    Gets together: Not on file     Attends religious service: Not on file    Active member of club or organization: Not on file    Attends meetings of clubs or organizations: Not on file    Relationship status: Not on file  . Intimate partner violence:    Fear of current or ex partner:  Not on file    Emotionally abused: Not on file    Physically abused: Not on file    Forced sexual activity: Not on file  Other Topics Concern  . Not on file  Social History Narrative  . Not on file   Family History  Problem Relation Age of Onset  . Hypertension Father   . Heart disease Father   . Cancer Mother        uterine  . Diabetes Mother   . Diabetes Brother   . Cancer Brother        lymphoma  . Diabetes Brother     Objective: Office vital signs reviewed. BP 133/68   Pulse 100   Temp 98.1 F (36.7 C) (Oral)   Ht 6' (1.829 m)   Wt 292 lb (132.5 kg)   BMI 39.60 kg/m   Physical Examination:  General: Awake, alert, obese, nontoxic, No acute distress HEENT: Normal, mild conjunctival pallor appreciated.    Nose: nasal turbinates moist, there is a hemostatic bleed appreciated within the right nare along the anterior medial septum.    Throat: moist mucus membranes, no erythema,  tonsillar exudate.  Airway is patent Pulm: Normal work of breathing on room air   Assessment/ Plan: 67 y.o. male   1. Right-sided epistaxis Currently hemostatic.  However he has been having prolonged nasal bleeding.  This is likely related to his current anticoagulated status with Eliquis, Plavix and aspirin.  I did advise him to call his cardiologist with regards to the aspirin.  While this is most certainly beneficial given coronary artery disease I do wonder if perhaps he can take this every other day to reduce risk of bleeding given use of other 2 medications.  I have referred him to ear nose and throat.  I have also given them home care instructions should the nosebleed recur.  If pressure is not helpful after 15 minutes, may proceed with use  of Afrin nasal spray.  If this does not stop the bleed I did recommend that he seek medical attention.  Both his wife and patient voiced good understanding.  I discussed his care with his PCP, who is in agreement of the plan.  Check hemoglobin today.  Follow-up as needed. - Hemoglobin, fingerstick - Ambulatory referral to ENT  2. Chronic anticoagulation - Ambulatory referral to ENT   Orders Placed This Encounter  Procedures  . Hemoglobin, fingerstick  . Ambulatory referral to ENT    Referral Priority:   Routine    Referral Type:   Consultation    Referral Reason:   Specialty Services Required    Requested Specialty:   Otolaryngology    Number of Visits Requested:   [place], [place] [phone]

## 2018-01-04 NOTE — Patient Instructions (Addendum)
I have placed a referral to ear nose and throat for further evaluation of your nosebleeds.  I would recommend that you contact the cardiologist with regards to the aspirin in the setting of use of Eliquis and Plavix since he is having more pronounced nosebleeds since initiation of the aspirin.  We also discussed that blood pressure control will certainly help.  Definitely apply pressure if he has recurrence of this nosebleed.  If the nosebleed continues despite applied pressure after 15 minutes, you may proceed with use of Afrin nasal spray in the affected nostril.  If nosebleed still continues and is copious, please come in for evaluation or seek evaluation in the emergency department.   Nosebleed, Adult A nosebleed is when blood comes out of the nose. Nosebleeds are common. Usually, they are not a sign of a serious condition. Nosebleeds can happen if a small blood vessel in your nose starts to bleed or if the lining of your nose (mucous membrane) cracks. They are commonly caused by:  Allergies.  Colds.  Picking your nose.  Blowing your nose too hard.  An injury from sticking an object into your nose or getting hit in the nose.  Dry or cold air.  Less common causes of nosebleeds include:  Toxic fumes.  Something abnormal in the nose or in the air-filled spaces in the bones of the face (sinuses).  Growths in the nose, such as polyps.  Medicines or conditions that cause blood to clot slowly.  Certain illnesses or procedures that irritate or dry out the nasal passages.  Follow these instructions at home: When you have a nosebleed:  Sit down and tilt your head slightly forward.  Use a clean towel or tissue to pinch your nostrils under the bony part of your nose. After 10 minutes, let go of your nose and see if bleeding starts again. Do not release pressure before that time. If there is still bleeding, repeat the pinching and holding for 10 minutes until the bleeding stops.  Do not  place tissues or gauze in the nose to stop bleeding.  Avoid lying down and avoid tilting your head backward. That may make blood collect in the throat and cause gagging or coughing.  Use a nasal spray decongestant to help with a nosebleed as told by your health care provider.  Do not use petroleum jelly or mineral oil in your nose. It can drip into your lungs. After a nosebleed:  Avoid blowing your nose or sniffing for a number of hours.  Avoid straining, lifting, or bending at the waist for several days. You may resume other normal activities as you are able.  Use saline spray or a humidifier as told by your health care provider.  Aspirinand blood thinners make bleeding more likely. If you are prescribed these medicines and you suffer from nosebleeds: ? Ask your health care provider if you should stop taking the medicines or if you should adjust the dose. ? Do not stop taking medicines that your health care provider has recommended unless told by your health care provider.  If your nosebleed was caused by dry mucous membranes, use over-the-counter saline nasal spray or gel. This will keep the mucous membranes moist and allow them to heal. If you must use a lubricant: ? Choose one that is water-soluble. ? Use only as much as you need and use it only as often as needed. ? Do not lie down until several hours after you use it. Contact a health care provider  if:  You have a fever.  You get nosebleeds often or more often than usual.  You bruise very easily.  You have a nosebleed from having something stuck in your nose.  You have bleeding in your mouth.  You vomit or cough up brown material.  You have a nosebleed after you start a new medicine. Get help right away if:  You have a nosebleed after a fall or a head injury.  Your nosebleed does not go away after 20 minutes.  You feel dizzy or weak.  You have unusual bleeding from other parts of your body.  You have unusual  bruising on other parts of your body.  You become sweaty.  You vomit blood. This information is not intended to replace advice given to you by your health care provider. Make sure you discuss any questions you have with your health care provider. Document Released: 05/26/2005 Document Revised: 04/15/2016 Document Reviewed: 03/02/2016 Elsevier Interactive Patient Education  [employer].

## 2018-01-04 NOTE — Progress Notes (Signed)
[facility]  Patient name: Jose [name] MRN: [id number] DOB: 1951-06-14 Sex: male   REASON FOR VISIT:    Follow up  [place]:    Jose [name] is a 67 y.o. male who I last saw him in 2017.  He initially presented in July of that year with an acute stroke.  He is having difficulty standing.  During his work-up he was found to have a high-grade left carotid artery stenosis.  After reviewing the CT scan he had circumferential significant calcification in the went up well above the angle of the mandible, near the carotid siphon.  He was not a candidate for carotid endarterectomy.  He was scheduled for angiography angiography revealed a nearly occluded left carotid artery with significant calcification.  It was felt that he was not a good candidate for carotid stenting because of his significant calcification.  Medical therapy was recommended.  During follow-up he had several ultrasound studies that revealed silent interval occlusion of his left carotid artery.  About 6 weeks ago, the patient had an episode of slurred speech and right arm weakness which was associated with hypoglycemia.  His symptoms resolved quickly.  No intervention was performed.  He has no residual deficits.  The patient had a MRI in 2018.  He is on aspirin Plavix and Eliquis.  He is having frequent nosebleeds.  The patient is a diabetic.  He suffers from hypercholesterolemia however he is statin intolerant.   PAST MEDICAL HISTORY:   Past Medical History:  Diagnosis Date  . Anemia   . Anxiety   . CAD (coronary artery disease)    a. prior silent inferior MI, NSTEMI 07/2014 with chronically occluded RCA s/p unsuccessful PCI. b. STEMI 07/2017 with multivessel disease s/p failed PTCA of distal LAD.  [name] Kitchen Carotid artery disease ([place])   . Cataract   . Chronic combined systolic and diastolic CHF (congestive heart failure) ([place])   . Chronic  respiratory failure ([place])    a. Placed on home O2 01/2016.  [name] Kitchen CKD (chronic kidney disease), stage III ([place])   . COPD (chronic obstructive pulmonary disease) ([place])   . Diabetes mellitus ([place])   . Former tobacco use   . Hyperlipidemia   . Hypertension   . Hyponatremia   . Morbid obesity ([place])   . Neuropathy   . PAF (paroxysmal atrial fibrillation) ([place])    a. dx 06/2017.  [name] Kitchen Sleep apnea   . Stroke ([place]) 06/2017  . TIA (transient ischemic attack) 2017   a. hemispheric L carotid artery syndrome in setting of hypotension.     FAMILY HISTORY:   Family History  Problem Relation Age of Onset  . Hypertension Father   . Heart disease Father   . Cancer Mother        uterine  . Diabetes Mother   . Diabetes Brother   . Cancer Brother        lymphoma  . Diabetes Brother     SOCIAL HISTORY:   Social History   Tobacco Use  . Smoking status: Former Smoker    Packs/day: 3.00    Years: 45.00    Pack years: 135.00    Types: Cigarettes    Last attempt to quit: 08/31/2007    Years since quitting: 10.3  . Smokeless tobacco: Never Used  Substance Use Topics  . Alcohol use: No     ALLERGIES:   Allergies  Allergen Reactions  . Amiodarone  Weakness during 2018 admission  . Atorvastatin     Tremors  . Simvastatin     Muscle weakness     CURRENT MEDICATIONS:   Current Outpatient Medications  Medication Sig Dispense Refill  . apixaban (ELIQUIS) 5 MG TABS tablet Take 1 tablet (5 mg total) by mouth 2 (two) times daily. 60 tablet 11  . clopidogrel (PLAVIX) 75 MG tablet Take 1 tablet (75 mg total) by mouth daily. 30 tablet 2  . ergocalciferol (VITAMIN D2) 50000 UNITS capsule Take 50,000 Units by mouth See admin instructions. Take one twice a week on wednesdays and saturdays    . ezetimibe (ZETIA) 10 MG tablet Take 1 tablet (10 mg total) by mouth daily. 30 tablet 11  . ferrous sulfate 325 (65 FE) MG tablet Take 325 mg by mouth 3 (three) times daily with meals.    . furosemide  (LASIX) 40 MG tablet Take 40 mg by mouth 2 (two) times daily.    [name] Kitchen glipiZIDE (GLUCOTROL) 5 MG tablet Take 5 mg by mouth 2 (two) times daily.     . insulin aspart (NOVOLOG) 100 UNIT/ML injection Inject 30 Units into the skin 3 (three) times daily before meals.     . insulin glargine (LANTUS) 100 UNIT/ML injection Inject 0.3-0.6 mLs (30-60 Units total) into the skin See admin instructions. 50 units in the AM and 60 units in the PM (Patient taking differently: Inject 50-60 Units into the skin See admin instructions. 50 units in the AM and 60 units in the PM) 10 mL 11  . isosorbide mononitrate (IMDUR) 30 MG 24 hr tablet Take 0.5 tablets (15 mg total) by mouth daily. 30 tablet 2  . levothyroxine (SYNTHROID, LEVOTHROID) 50 MCG tablet Take 1 tablet (50 mcg total) by mouth daily before breakfast. 90 tablet 1  . magnesium oxide (MAG-OX) 400 MG tablet Take 1 tablet (400 mg total) by mouth daily. 34 tablet 3  . metFORMIN (GLUCOPHAGE) 1000 MG tablet Take 1 tablet (1,000 mg total) by mouth 2 (two) times daily with a meal. 60 tablet 3  . metoprolol succinate (TOPROL-XL) 25 MG 24 hr tablet Take 0.5 tablets (12.5 mg total) by mouth daily. 30 tablet 2  . Multiple Vitamins-Minerals (ICAPS AREDS 2) CAPS Take 1 capsule by mouth 2 (two) times daily.    . nitroGLYCERIN (NITROSTAT) 0.4 MG SL tablet Place 1 tablet (0.4 mg total) under the tongue every 5 (five) minutes x 3 doses as needed for chest pain. 25 tablet 12  . Omega-3 Fatty Acids (FISH OIL) 1000 MG CAPS Take 2,000-3,000 mg See admin instructions by mouth. Take 2000 mg every morning Take 3000 mg at noon and evening.    [name] Kitchen oxyCODONE-acetaminophen (PERCOCET/ROXICET) 5-325 MG per tablet Take 2 tablets by mouth every 6 (six) hours as needed. Take eight tablets daily as needed for pain per family and list. For bad  Back pain    . sertraline (ZOLOFT) 100 MG tablet Take 100 mg by mouth daily.    [name] Kitchen tiotropium (SPIRIVA) 18 MCG inhalation capsule Place 18 mcg into inhaler and  inhale at bedtime.     . vitamin B-12 (CYANOCOBALAMIN) 1000 MCG tablet Take 1,000 mcg by mouth daily.     No current facility-administered medications for this visit.     REVIEW OF SYSTEMS:   [X]  denotes positive finding, [ ]  denotes negative finding Cardiac  Comments:  Chest pain or chest pressure:    Shortness of breath upon exertion: x   Short of breath when  lying flat:    Irregular heart rhythm:        Vascular    Pain in calf, thigh, or hip brought on by ambulation:    Pain in feet at night that wakes you up from your sleep:     Blood clot in your veins:    Leg swelling:         Pulmonary    Oxygen at home:    Productive cough:     Wheezing:         Neurologic    Sudden weakness in arms or legs:     Sudden numbness in arms or legs:     Sudden onset of difficulty speaking or slurred speech:    Temporary loss of vision in one eye:     Problems with dizziness:         Gastrointestinal    Blood in stool:     Vomited blood:         Genitourinary    Burning when urinating:     Blood in urine:        Psychiatric    Major depression:         Hematologic    Bleeding problems:    Problems with blood clotting too easily:        Skin    Rashes or ulcers:        Constitutional    Fever or chills:      PHYSICAL EXAM:   Vitals:   01/04/18 1156 01/04/18 1158  BP: (!) 167/85 (!) 176/85  Pulse: 95   Resp: 19   Temp: (!) 97 F (36.1 C)   TempSrc: Oral   SpO2: 94%   Weight: 292 lb (132.5 kg)   Height: 6' (1.829 m)     GENERAL: The patient is a well-nourished male, in no acute distress. The vital signs are documented above. CARDIAC: There is a regular rate and rhythm.  PULMONARY: Non-labored respirations MUSCULOSKELETAL: There are no major deformities or cyanosis. NEUROLOGIC: No focal weakness or paresthesias are detected. SKIN: There are no ulcers or rashes noted. PSYCHIATRIC: The patient has a normal affect.  STUDIES:   I have reviewed his carotid  duplex from today.  This shows 1-39% right-sided stenosis.  The left carotid remains occluded.  MEDICAL ISSUES:   Chronic left carotid occlusion and widely patent right carotid artery.  No intervention is recommended at this time.  Medical management should be continued.  He will follow-up in 1 year with a repeat carotid duplex.  I have asked him to call Dr. Harl Bowie, his cardiologist to assess his antiplatelet/anticoagulation medication getting his recent initiation of frequent nosebleeds.    Annamarie Major, MD [facility] [phone] Pager [phone]

## 2018-01-04 NOTE — Telephone Encounter (Signed)
Pt seen by PCP today. Note in Epic. Please advise.

## 2018-01-04 NOTE — Telephone Encounter (Signed)
Patient's wife states that patient has been having nose bleeds. States that she was advised by PCP and by Vascular doctor to contact Dr.Branch in regards to stopping one of the blood thinners.  / tg

## 2018-01-05 NOTE — Telephone Encounter (Signed)
Spoke with Pt's wife to inform her of medication changes. Wife voiced understanding and will call on Monday to update on pt status.

## 2018-01-05 NOTE — Telephone Encounter (Signed)
Verify he is taking plavix, aspirin, and eliquis. Have him stop aspirin for good. Would hold eliquis x 2 days, if bleeding stops then resume. Continue plavix without stopping. Routed to [place], not sure if anyone is off today.  Have him update [place] on Monday.    J Deazia Lampi MD

## 2018-02-02 ENCOUNTER — Encounter (INDEPENDENT_AMBULATORY_CARE_PROVIDER_SITE_OTHER): Payer: Medicare HMO | Admitting: Ophthalmology

## 2018-02-02 DIAGNOSIS — H26493 Other secondary cataract, bilateral: Secondary | ICD-10-CM

## 2018-02-02 DIAGNOSIS — E11319 Type 2 diabetes mellitus with unspecified diabetic retinopathy without macular edema: Secondary | ICD-10-CM | POA: Diagnosis not present

## 2018-02-02 DIAGNOSIS — H35033 Hypertensive retinopathy, bilateral: Secondary | ICD-10-CM | POA: Diagnosis not present

## 2018-02-02 DIAGNOSIS — H43813 Vitreous degeneration, bilateral: Secondary | ICD-10-CM | POA: Diagnosis not present

## 2018-02-02 DIAGNOSIS — E113593 Type 2 diabetes mellitus with proliferative diabetic retinopathy without macular edema, bilateral: Secondary | ICD-10-CM | POA: Diagnosis not present

## 2018-02-02 DIAGNOSIS — I10 Essential (primary) hypertension: Secondary | ICD-10-CM | POA: Diagnosis not present

## 2018-02-16 ENCOUNTER — Ambulatory Visit: Payer: Medicare HMO | Admitting: Cardiology

## 2018-02-16 NOTE — Progress Notes (Deleted)
Clinical Summary Mr. Ricciuti is a 67 y.o.male seen today for follow up of the following medical problems.   1. Chronic diastolic HF - admit late 11/971 with acute on chronic diastolic HF - diuresed, discharge weight 315 lbs.  01/2016 echo LVEF 53-29%, grade I diastolic dysfunction   - mild SOB at times. SOme increase over the last few days - recent LE edema. Takes lasix 40mg  bid. Limiting sodium intake.  - compliant with meds - home weights around 283 lbs, fairly stable.    2. CAD -  cath 07/2014 with LM patent, LAD ostial 40-50%, LCX mld disease, RCA occluded filling by collaterals. Unsuccesful attempt at RCA PCI.  - 06/2017 cath distal LM/ostial LAD 70%, mid to distal LAD 100%, ostial LCX 70%, RCA 100%. Unsuccesful PTCA of mid to distal LAD thought to be culprit. RCA is CTO. Could not cross LCX with wire. Recs if refractory angina to consider CABG.  06/2017 echo LVEF 40%, aneurusmal apex  ?nuke   - no recent chest pain.   3.HTN -remains compliant with meds  4. Hyperlipidemia - compliant with statin  - 01/2016 TC 113 TG 265 HDL 26 LDL 34 - elevated TGs in setting of uncontrolled DM2.   - muscle aches on lipitor. Crestor caused significantside effects and he stopped taking. - has been on zetia 10mg  daily.  - Jan 2019 TC 128 TG 152 HDL 28 LDL 70  5. COPD - compliant with inhalers  6. OSA - compliant with CPAP - [facility]  7. CVA - admit 02/2016 with right sided weakness, received tPA.  - from prior notes higher bp goal due to severe carotid stenosis, which is followed by vascular. - most recent US shows LICA is now occluded.  - 06/2017 MRI acute left parietal CVA.   8. Afib - no recent palpitations - no recent bleeing on eliquis.   - nose bleeds in 12/2017, had been on triple therapy at that time. ASA was stopped.   9. Carotid stenosis --12/2017 carotid US: RICA 9-24%, LICA occluded.   10. CKD 3 - followed by pcp  11.  Hypothyroidism - recent issues with hypothyroidism, started on meds per pcp   12. AAA - mild by [place] in 05/2016, requires [place] 05/2019    Past Medical History:  Diagnosis Date  . Anemia   . Anxiety   . CAD (coronary artery disease)    a. prior silent inferior MI, NSTEMI 07/2014 with chronically occluded RCA s/p unsuccessful PCI. b. STEMI 07/2017 with multivessel disease s/p failed PTCA of distal LAD.  [name] Kitchen Carotid artery disease ([place])   . Cataract   . Chronic combined systolic and diastolic CHF (congestive heart failure) ([place])   . Chronic respiratory failure ([place])    a. Placed on home O2 01/2016.  [name] Kitchen CKD (chronic kidney disease), stage III ([place])   . COPD (chronic obstructive pulmonary disease) ([place])   . Diabetes mellitus ([place])   . Former tobacco use   . Hyperlipidemia   . Hypertension   . Hyponatremia   . Morbid obesity ([place])   . Neuropathy   . PAF (paroxysmal atrial fibrillation) ([place])    a. dx 06/2017.  [name] Kitchen Sleep apnea   . Stroke ([place]) 06/2017  . TIA (transient ischemic attack) 2017   a. hemispheric L carotid artery syndrome in setting of hypotension.     Allergies  Allergen Reactions  . Amiodarone     Weakness during 2018 admission  . Atorvastatin  Tremors  . Simvastatin     Muscle weakness     Current Outpatient Medications  Medication Sig Dispense Refill  . apixaban (ELIQUIS) 5 MG TABS tablet Take 1 tablet (5 mg total) by mouth 2 (two) times daily. 60 tablet 11  . aspirin 81 MG chewable tablet Chew by mouth daily.    . clopidogrel (PLAVIX) 75 MG tablet Take 1 tablet (75 mg total) by mouth daily. 30 tablet 2  . ergocalciferol (VITAMIN D2) 50000 UNITS capsule Take 50,000 Units by mouth See admin instructions. Take one twice a week on wednesdays and saturdays    . ezetimibe (ZETIA) 10 MG tablet Take 1 tablet (10 mg total) by mouth daily. 30 tablet 11  . ferrous sulfate 325 (65 FE) MG tablet Take 325 mg by mouth 3 (three) times daily with meals.    . furosemide  (LASIX) 40 MG tablet Take 40 mg by mouth 2 (two) times daily.    [name] Kitchen glipiZIDE (GLUCOTROL) 5 MG tablet Take 5 mg by mouth 2 (two) times daily.     . insulin aspart (NOVOLOG) 100 UNIT/ML injection Inject 30 Units into the skin 3 (three) times daily before meals.     . insulin glargine (LANTUS) 100 UNIT/ML injection Inject 0.3-0.6 mLs (30-60 Units total) into the skin See admin instructions. 50 units in the AM and 60 units in the PM (Patient taking differently: Inject 50-60 Units into the skin See admin instructions. 50 units in the AM and 60 units in the PM) 10 mL 11  . isosorbide mononitrate (IMDUR) 30 MG 24 hr tablet Take 0.5 tablets (15 mg total) by mouth daily. 30 tablet 2  . levothyroxine (SYNTHROID, LEVOTHROID) 50 MCG tablet Take 1 tablet (50 mcg total) by mouth daily before breakfast. 90 tablet 1  . magnesium oxide (MAG-OX) 400 MG tablet Take 1 tablet (400 mg total) by mouth daily. 34 tablet 3  . metFORMIN (GLUCOPHAGE) 1000 MG tablet Take 1 tablet (1,000 mg total) by mouth 2 (two) times daily with a meal. 60 tablet 3  . metoprolol succinate (TOPROL-XL) 25 MG 24 hr tablet Take 0.5 tablets (12.5 mg total) by mouth daily. 30 tablet 2  . Multiple Vitamins-Minerals (ICAPS AREDS 2) CAPS Take 1 capsule by mouth 2 (two) times daily.    . nitroGLYCERIN (NITROSTAT) 0.4 MG SL tablet Place 1 tablet (0.4 mg total) under the tongue every 5 (five) minutes x 3 doses as needed for chest pain. 25 tablet 12  . Omega-3 Fatty Acids (FISH OIL) 1000 MG CAPS Take 2,000-3,000 mg See admin instructions by mouth. Take 2000 mg every morning Take 3000 mg at noon and evening.    [name] Kitchen oxyCODONE-acetaminophen (PERCOCET/ROXICET) 5-325 MG per tablet Take 2 tablets by mouth every 6 (six) hours as needed. Take eight tablets daily as needed for pain per family and list. For bad  Back pain    . sertraline (ZOLOFT) 100 MG tablet Take 100 mg by mouth daily.    [name] Kitchen tiotropium (SPIRIVA) 18 MCG inhalation capsule Place 18 mcg into inhaler and  inhale at bedtime.     . vitamin B-12 (CYANOCOBALAMIN) 1000 MCG tablet Take 1,000 mcg by mouth daily.     No current facility-administered medications for this visit.      Past Surgical History:  Procedure Laterality Date  . BACK SURGERY    . CORONARY BALLOON ANGIOPLASTY N/A 07/14/2017   Procedure: CORONARY BALLOON ANGIOPLASTY;  Surgeon: Martinique, Peter M, MD;  Location: [facility];  Service: Cardiovascular;  Laterality: N/A;  . EYE SURGERY    . INTRAVASCULAR ULTRASOUND/IVUS N/A 07/14/2017   Procedure: Intravascular Ultrasound/IVUS;  Surgeon: Martinique, Peter M, MD;  Location: [facility];  Service: Cardiovascular;  Laterality: N/A;  . LEFT HEART CATH AND CORONARY ANGIOGRAPHY N/A 07/14/2017   Procedure: LEFT HEART CATH AND CORONARY ANGIOGRAPHY;  Surgeon: Martinique, Peter M, MD;  Location: [facility];  Service: Cardiovascular;  Laterality: N/A;  . LEFT HEART CATHETERIZATION WITH CORONARY ANGIOGRAM N/A 08/28/2014   Procedure: LEFT HEART CATHETERIZATION WITH CORONARY ANGIOGRAM;  Surgeon: Clent Demark, MD;  Location: [facility];  Service: Cardiovascular;  Laterality: N/A;  . PERIPHERAL VASCULAR CATHETERIZATION Bilateral 03/22/2016   Procedure: Carotid Angiography;  Surgeon: Elam Dutch, MD;  Location: [facility];  Service: Cardiovascular;  Laterality: Bilateral;     Allergies  Allergen Reactions  . Amiodarone     Weakness during 2018 admission  . Atorvastatin     Tremors  . Simvastatin     Muscle weakness      Family History  Problem Relation Age of Onset  . Hypertension Father   . Heart disease Father   . Cancer Mother        uterine  . Diabetes Mother   . Diabetes Brother   . Cancer Brother        lymphoma  . Diabetes Brother      Social History Mr. Stcharles reports that he quit smoking about 10 years ago. His smoking use included cigarettes. He has a 135.00 pack-year smoking history. He has never used smokeless tobacco. Mr. Motyka reports  that he does not drink alcohol.   Review of Systems CONSTITUTIONAL: No weight loss, fever, chills, weakness or fatigue.  HEENT: Eyes: No visual loss, blurred vision, double vision or yellow sclerae.No hearing loss, sneezing, congestion, runny nose or sore throat.  SKIN: No rash or itching.  CARDIOVASCULAR:  RESPIRATORY: No shortness of breath, cough or sputum.  GASTROINTESTINAL: No anorexia, nausea, vomiting or diarrhea. No abdominal pain or blood.  GENITOURINARY: No burning on urination, no polyuria NEUROLOGICAL: No headache, dizziness, syncope, paralysis, ataxia, numbness or tingling in the extremities. No change in bowel or bladder control.  MUSCULOSKELETAL: No muscle, back pain, joint pain or stiffness.  LYMPHATICS: No enlarged nodes. No history of splenectomy.  PSYCHIATRIC: No history of depression or anxiety.  ENDOCRINOLOGIC: No reports of sweating, cold or heat intolerance. No polyuria or polydipsia.  [name] Kitchen   Physical Examination There were no vitals filed for this visit. There were no vitals filed for this visit.  Gen: resting comfortably, no acute distress HEENT: no scleral icterus, pupils equal round and reactive, no palptable cervical adenopathy,  CV Resp: Clear to auscultation bilaterally GI: abdomen is soft, non-tender, non-distended, normal bowel sounds, no hepatosplenomegaly MSK: extremities are warm, no edema.  Skin: warm, no rash Neuro:  no focal deficits Psych: appropriate affect   Diagnostic Studies 07/2014 cath FINDINGS:LV showed inferobasal wall severe hypokinesia,EF of 50-55%. Left main was long which was patent.LAD has 40-50% ostial stenosis and 20-30% proximal stenosis.Diagonal 1 and 2 were small which had mild disease.Ramus was small which was patent.Left circumflex has mild disease.OM1 is moderate sized, which is patent.OM 2 is small but long vessel, which has mild disease.RCA was 100% occluded beyond proximal portion filling by  bridging collaterals and also collaterals from the left system.  INTERVENTIONAL PROCEDURE:Attempted to pass the wire into RCA using multiple wires i.e. ChoICE PT, Prowater, Runthrough, [employer]  FC, and Fielder XT.Finally Fielder XT wire could be passed up to the mid portion of the RCA, but no balloon catheter could be advanced beyond the proximal portion, initially tried 2.5 x 12 mm long and then 1.20 x 20 mm long without success.The patient did not have any episodes of chest pain during the procedure.The patient tolerated the procedure well. There were no complications.The patient was transferred to recovery room in stable condition.Plan is to treat him medically.  06/2017 echo Study Conclusions  - Left ventricle: LVEF is approximately 40% with akinesis of the distal inferoseptal wall and apex. Aneurysmal dilatation of apex. Consider limited echo with contrast to evaluate for thrombus. The cavity size was normal. Wall thickness was increased in a pattern of mild LVH. - Mitral valve: Calcified annulus. Mildly thickened leaflets .  06/2017 cath  Dist LM to Ost LAD lesion is 70% stenosed.  Prox LAD to Mid LAD lesion is 30% stenosed.  Mid LAD to Dist LAD lesion is 100% stenosed.  Ost Cx lesion is 70% stenosed.  Prox RCA lesion is 100% stenosed.  LV end diastolic pressure is moderately elevated.  Balloon angioplasty was performed using a BALLOON EUPHORA [id number].  Post intervention, there is a 100% residual stenosis.  1. 3 vessel obstructive CAD. - culprit lesion is occlusion of distal LAD. Unsuccessful attempt at PTCA- unable to restore flow despite balloon angioplasty. - 70% ostial LAD with concentric calcified plaque. Minimal lumen area 5.2 mm squared by IVUS - approximately 70% ostial LCx disease. Unable to cross with IVUS catheter. - CTO of the proximal RCA with good left to right collaterals 2. Moderately elevated LVEDP 3.  Persistent Afib   Plan: aggressive medical management. Assess LV functio by Echo. It is unclear the significance of the ostial LAD and LCx. If he has refractory angina I would consider referral for CABG. If symptoms improve I would consider ischemic work up with a stress myoview in the near future to assess need for further revascularization.  06/2017 MRA IMPRESSION: Interval occlusion of the left internal carotid artery with absence of antegrade flow at the skullbase. Diminished but present flow in the anterior circulation on the left probably secondary to patent anterior and posterior communicating arteries.  Stenosis of the A1 segment on the right, additional importance based on the relevance to collateral flow to the left hemisphere.  Approximately 70% stenosis of the right vertebral artery at the foramen magnum level. Antegrade flow does persist. Dominant left vertebral artery freely supplies the basilar.  05/2016 AAA US IMPRESSION: Limited exam due to patient's body habitus. 3.0 cm distal abdominal aortic aneurysm is present. Recommend followup by ultrasound in 3 years. This recommendation follows ACR consensus guidelines: White Paper of the ACR Incidental Findings Committee II on Vascular  12/2017 carotid US Final Interpretation: Right Carotid: Velocities in the right ICA are consistent with a 1-39% stenosis.  Left Carotid: Velocities in the left ICA are consistent with a total occlusion.  Vertebrals: Left vertebral artery demonstrates antegrade flow. Right vertebral       artery was not visualized. Subclavians: Normal flow hemodynamics were seen in bilateral subclavian       arteries.  Assessment and Plan  1.Chronic diastolic HF - ongoing edema, increase lasix to 60mg  in AM and 40mg  in PM x 4 days, then resume 40mg  bid.    2. CAD -no symptoms, continue current meds  3. HTN - bp at goal, continue current meds  4. Hyperlipidemia - counseled  on diet and  exercise to improve HDL and TGs, LDL at goal - intolerant to statins, continue zetia.   5. Afib - continue current meds, no symptoms. Continue anticoag       Arnoldo Lenis, M.D., F.A.C.C.

## 2018-02-23 ENCOUNTER — Encounter (INDEPENDENT_AMBULATORY_CARE_PROVIDER_SITE_OTHER): Payer: Medicare HMO | Admitting: Ophthalmology

## 2018-03-01 ENCOUNTER — Encounter (INDEPENDENT_AMBULATORY_CARE_PROVIDER_SITE_OTHER): Payer: Non-veteran care | Admitting: Ophthalmology

## 2018-03-01 DIAGNOSIS — H2701 Aphakia, right eye: Secondary | ICD-10-CM

## 2018-03-07 ENCOUNTER — Telehealth: Payer: Self-pay | Admitting: Family Medicine

## 2018-03-08 MED ORDER — APIXABAN 5 MG PO TABS: 5.0000 mg | ORAL_TABLET | Freq: Two times a day (BID) | ORAL | 0 refills | Status: AC

## 2018-03-08 NOTE — Telephone Encounter (Signed)
Yes go ahead and send Eliquis to 5 mg twice daily for 14 days

## 2018-03-08 NOTE — Telephone Encounter (Signed)
Sent and wife aware  

## 2018-03-09 DIAGNOSIS — B350 Tinea barbae and tinea capitis: Secondary | ICD-10-CM | POA: Diagnosis not present

## 2018-03-09 DIAGNOSIS — L98499 Non-pressure chronic ulcer of skin of other sites with unspecified severity: Secondary | ICD-10-CM | POA: Diagnosis not present

## 2018-03-09 DIAGNOSIS — D485 Neoplasm of uncertain behavior of skin: Secondary | ICD-10-CM | POA: Diagnosis not present

## 2018-03-16 DIAGNOSIS — L570 Actinic keratosis: Secondary | ICD-10-CM | POA: Diagnosis not present

## 2018-03-16 DIAGNOSIS — X32XXXD Exposure to sunlight, subsequent encounter: Secondary | ICD-10-CM | POA: Diagnosis not present

## 2018-03-16 DIAGNOSIS — C44319 Basal cell carcinoma of skin of other parts of face: Secondary | ICD-10-CM | POA: Diagnosis not present

## 2018-03-20 ENCOUNTER — Ambulatory Visit: Payer: Medicare HMO | Admitting: Cardiology

## 2018-03-20 ENCOUNTER — Encounter: Payer: Self-pay | Admitting: Cardiology

## 2018-03-20 VITALS — BP 169/73 | HR 100 | Ht 72.0 in | Wt 301.4 lb

## 2018-03-20 DIAGNOSIS — I480 Paroxysmal atrial fibrillation: Secondary | ICD-10-CM | POA: Diagnosis not present

## 2018-03-20 DIAGNOSIS — I5032 Chronic diastolic (congestive) heart failure: Secondary | ICD-10-CM

## 2018-03-20 DIAGNOSIS — E782 Mixed hyperlipidemia: Secondary | ICD-10-CM

## 2018-03-20 DIAGNOSIS — I10 Essential (primary) hypertension: Secondary | ICD-10-CM

## 2018-03-20 DIAGNOSIS — N184 Chronic kidney disease, stage 4 (severe): Secondary | ICD-10-CM | POA: Diagnosis not present

## 2018-03-20 DIAGNOSIS — I6523 Occlusion and stenosis of bilateral carotid arteries: Secondary | ICD-10-CM | POA: Diagnosis not present

## 2018-03-20 DIAGNOSIS — I2510 Atherosclerotic heart disease of native coronary artery without angina pectoris: Secondary | ICD-10-CM | POA: Diagnosis not present

## 2018-03-20 NOTE — Progress Notes (Signed)
Clinical Summary Jose [name] is a 67 y.o.male  seen today for follow up of the following medical problems.   1. Chronic diastolic HF - admit late 04/8337 with acute on chronic diastolic HF - diuresed, discharge weight 315 lbs.  01/2016 echo LVEF 25-05%, grade I diastolic dysfunction  - occasional LE edema. Reports some increased sodium intake.    2. CAD -  cath 07/2014 with LM patent, LAD ostial 40-50%, LCX mld disease, RCA occluded filling by collaterals. Unsuccesful attempt at RCA PCI.  - 06/2017 cath distal LM/ostial LAD 70%, mid to distal LAD 100%, ostial LCX 70%, RCA 100%. Unsuccesful PTCA of mid to distal LAD thought to be culprit. RCA is CTO. Could not cross LCX with wire. Recs if refractory angina to consider CABG.  06/2017 echo LVEF 40%, aneurusmal apex   - recent nose bleeds on aspirin, plavix, elqiuis. Resolved off aspirin - no recent chest pain. Some SOB just started today. No coughing or wheezing, has had some LE edema.  - home scale daily, stable around 292 lbs. Has had increased salt itnake.   3.HTN -compliant with home meds    4. Hyperlipidemia - compliant with statin  - 01/2016 TC 113 TG 265 HDL 26 LDL 34 - elevated TGs in setting of uncontrolled DM2.   - muscle aches on lipitor. Crestor caused significantside effects and he stopped taking.Did not tolerate simvastatin.  - has been on zetia 10mg  daily which he started Nov 2018  - Jan 2019 TC 128 TG 152 HDL 28 LDL 70 10/2017 TC 214 TG 315 HDL 27 LDL 124 - they had asked previously about psck9 inhibitor. At that time in January LDL was 70 on zetia alone, and did not pursue. LDL up since then, they report may have a program through [place] to pay for pcsk9 inhibitor  5. COPD - compliant with inhalers  6. OSA - [facility]  7. CVA - admit 02/2016 with right sided weakness, received tPA.  - from prior notes higher bp goal due to severe carotid stenosis, which is followed by  vascular. - most recent US shows LICA is now occluded.  - 06/2017 MRI acute left parietal CVA.  - no recent acute symptoms.   8. Afib - no recent palptaionts.  - compliant with eliquis  9. Carotid stenosis -- LICA occluded by cartoid [place] 10/9765 RICA 3-41%, LICA CTO    10. CKD II-III - last GFR 11/2017 was 66    11. AAA - mild by [place] in 05/2016, requires [place] 05/2019 - no recent abdominal symptoms.     Past Medical History:  Diagnosis Date  . Anemia   . Anxiety   . CAD (coronary artery disease)    a. prior silent inferior MI, NSTEMI 07/2014 with chronically occluded RCA s/p unsuccessful PCI. b. STEMI 07/2017 with multivessel disease s/p failed PTCA of distal LAD.  [name] Kitchen Carotid artery disease ([place])   . Cataract   . Chronic combined systolic and diastolic CHF (congestive heart failure) ([place])   . Chronic respiratory failure ([place])    a. Placed on home O2 01/2016.  [name] Kitchen CKD (chronic kidney disease), stage III ([place])   . COPD (chronic obstructive pulmonary disease) ([place])   . Diabetes mellitus ([place])   . Former tobacco use   . Hyperlipidemia   . Hypertension   . Hyponatremia   . Morbid obesity ([place])   . Neuropathy   . PAF (paroxysmal atrial fibrillation) ([place])  a. dx 06/2017.  [name] Kitchen Sleep apnea   . Stroke ([place]) 06/2017  . TIA (transient ischemic attack) 2017   a. hemispheric L carotid artery syndrome in setting of hypotension.     Allergies  Allergen Reactions  . Amiodarone     Weakness during 2018 admission  . Atorvastatin     Tremors  . Simvastatin     Muscle weakness     Current Outpatient Medications  Medication Sig Dispense Refill  . apixaban (ELIQUIS) 5 MG TABS tablet Take 1 tablet (5 mg total) by mouth 2 (two) times daily. 28 tablet 0  . aspirin 81 MG chewable tablet Chew by mouth daily.    . clopidogrel (PLAVIX) 75 MG tablet Take 1 tablet (75 mg total) by mouth daily. 30 tablet 2  . ergocalciferol (VITAMIN D2) 50000 UNITS capsule Take 50,000 Units by  mouth See admin instructions. Take one twice a week on wednesdays and saturdays    . ezetimibe (ZETIA) 10 MG tablet Take 1 tablet (10 mg total) by mouth daily. 30 tablet 11  . ferrous sulfate 325 (65 FE) MG tablet Take 325 mg by mouth 3 (three) times daily with meals.    . furosemide (LASIX) 40 MG tablet Take 40 mg by mouth 2 (two) times daily.    [name] Kitchen glipiZIDE (GLUCOTROL) 5 MG tablet Take 5 mg by mouth 2 (two) times daily.     . insulin aspart (NOVOLOG) 100 UNIT/ML injection Inject 30 Units into the skin 3 (three) times daily before meals.     . insulin glargine (LANTUS) 100 UNIT/ML injection Inject 0.3-0.6 mLs (30-60 Units total) into the skin See admin instructions. 50 units in the AM and 60 units in the PM (Patient taking differently: Inject 50-60 Units into the skin See admin instructions. 50 units in the AM and 60 units in the PM) 10 mL 11  . isosorbide mononitrate (IMDUR) 30 MG 24 hr tablet Take 0.5 tablets (15 mg total) by mouth daily. 30 tablet 2  . levothyroxine (SYNTHROID, LEVOTHROID) 50 MCG tablet Take 1 tablet (50 mcg total) by mouth daily before breakfast. 90 tablet 1  . magnesium oxide (MAG-OX) 400 MG tablet Take 1 tablet (400 mg total) by mouth daily. 34 tablet 3  . metFORMIN (GLUCOPHAGE) 1000 MG tablet Take 1 tablet (1,000 mg total) by mouth 2 (two) times daily with a meal. 60 tablet 3  . metoprolol succinate (TOPROL-XL) 25 MG 24 hr tablet Take 0.5 tablets (12.5 mg total) by mouth daily. 30 tablet 2  . Multiple Vitamins-Minerals (ICAPS AREDS 2) CAPS Take 1 capsule by mouth 2 (two) times daily.    . nitroGLYCERIN (NITROSTAT) 0.4 MG SL tablet Place 1 tablet (0.4 mg total) under the tongue every 5 (five) minutes x 3 doses as needed for chest pain. 25 tablet 12  . Omega-3 Fatty Acids (FISH OIL) 1000 MG CAPS Take 2,000-3,000 mg See admin instructions by mouth. Take 2000 mg every morning Take 3000 mg at noon and evening.    [name] Kitchen oxyCODONE-acetaminophen (PERCOCET/ROXICET) 5-325 MG per tablet  Take 2 tablets by mouth every 6 (six) hours as needed. Take eight tablets daily as needed for pain per family and list. For bad  Back pain    . sertraline (ZOLOFT) 100 MG tablet Take 100 mg by mouth daily.    [name] Kitchen tiotropium (SPIRIVA) 18 MCG inhalation capsule Place 18 mcg into inhaler and inhale at bedtime.     . vitamin B-12 (CYANOCOBALAMIN) 1000 MCG tablet Take 1,000 mcg  by mouth daily.     No current facility-administered medications for this visit.      Past Surgical History:  Procedure Laterality Date  . BACK SURGERY    . CORONARY BALLOON ANGIOPLASTY N/A 07/14/2017   Procedure: CORONARY BALLOON ANGIOPLASTY;  Surgeon: Martinique, Peter M, MD;  Location: [facility];  Service: Cardiovascular;  Laterality: N/A;  . EYE SURGERY    . INTRAVASCULAR ULTRASOUND/IVUS N/A 07/14/2017   Procedure: Intravascular Ultrasound/IVUS;  Surgeon: Martinique, Peter M, MD;  Location: [facility];  Service: Cardiovascular;  Laterality: N/A;  . LEFT HEART CATH AND CORONARY ANGIOGRAPHY N/A 07/14/2017   Procedure: LEFT HEART CATH AND CORONARY ANGIOGRAPHY;  Surgeon: Martinique, Peter M, MD;  Location: [facility];  Service: Cardiovascular;  Laterality: N/A;  . LEFT HEART CATHETERIZATION WITH CORONARY ANGIOGRAM N/A 08/28/2014   Procedure: LEFT HEART CATHETERIZATION WITH CORONARY ANGIOGRAM;  Surgeon: Clent Demark, MD;  Location: [facility];  Service: Cardiovascular;  Laterality: N/A;  . PERIPHERAL VASCULAR CATHETERIZATION Bilateral 03/22/2016   Procedure: Carotid Angiography;  Surgeon: Elam Dutch, MD;  Location: [facility];  Service: Cardiovascular;  Laterality: Bilateral;     Allergies  Allergen Reactions  . Amiodarone     Weakness during 2018 admission  . Atorvastatin     Tremors  . Simvastatin     Muscle weakness      Family History  Problem Relation Age of Onset  . Hypertension Father   . Heart disease Father   . Cancer Mother        uterine  . Diabetes Mother   .  Diabetes Brother   . Cancer Brother        lymphoma  . Diabetes Brother      Social History Mr. Surette reports that he quit smoking about 10 years ago. His smoking use included cigarettes. He has a 135.00 pack-year smoking history. He has never used smokeless tobacco. Mr. Rathbone reports that he does not drink alcohol.   Review of Systems CONSTITUTIONAL: No weight loss, fever, chills, weakness or fatigue.  HEENT: Eyes: No visual loss, blurred vision, double vision or yellow sclerae.No hearing loss, sneezing, congestion, runny nose or sore throat.  SKIN: No rash or itching.  CARDIOVASCULAR: per hpi RESPIRATORY: per hpi GASTROINTESTINAL: No anorexia, nausea, vomiting or diarrhea. No abdominal pain or blood.  GENITOURINARY: No burning on urination, no polyuria NEUROLOGICAL: No headache, dizziness, syncope, paralysis, ataxia, numbness or tingling in the extremities. No change in bowel or bladder control.  MUSCULOSKELETAL: No muscle, back pain, joint pain or stiffness.  LYMPHATICS: No enlarged nodes. No history of splenectomy.  PSYCHIATRIC: No history of depression or anxiety.  ENDOCRINOLOGIC: No reports of sweating, cold or heat intolerance. No polyuria or polydipsia.  [name] Kitchen   Physical Examination Vitals:   03/20/18 1412  BP: (!) 169/73  Pulse: 100  SpO2: 92%   Vitals:   03/20/18 1412  Weight: (!) 301 lb 6.4 oz (136.7 kg)  Height: 6' (1.829 m)    Gen: resting comfortably, no acute distress HEENT: no scleral icterus, pupils equal round and reactive, no palptable cervical adenopathy,  CVL irreg, no mr/g, no jvd Resp: Clear to auscultation bilaterally GI: abdomen is soft, non-tender, non-distended, normal bowel sounds, no hepatosplenomegaly MSK: extremities are warm, 1+ bilateral edema Skin: warm, no rash Neuro:  no focal deficits Psych: appropriate affect   Diagnostic Studies 07/2014 cath FINDINGS:LV showed inferobasal wall severe hypokinesia,EF of 50-55%. Left main was  long  which was patent.LAD has 40-50% ostial stenosis and 20-30% proximal stenosis.Diagonal 1 and 2 were small which had mild disease.Ramus was small which was patent.Left circumflex has mild disease.OM1 is moderate sized, which is patent.OM 2 is small but long vessel, which has mild disease.RCA was 100% occluded beyond proximal portion filling by bridging collaterals and also collaterals from the left system.  INTERVENTIONAL PROCEDURE:Attempted to pass the wire into RCA using multiple wires i.e. ChoICE PT, [place], Run[employer], Fielder FC, and [employer].Finally Fielder XT wire could be passed up to the mid portion of the RCA, but no balloon catheter could be advanced beyond the proximal portion, initially tried 2.5 x 12 mm long and then 1.20 x 20 mm long without success.The patient did not have any episodes of chest pain during the procedure.The patient tolerated the procedure well. There were no complications.The patient was transferred to recovery room in stable condition.Plan is to treat him medically.  06/2017 echo Study Conclusions  - Left ventricle: LVEF is approximately 40% with akinesis of the distal inferoseptal wall and apex. Aneurysmal dilatation of apex. Consider limited echo with contrast to evaluate for thrombus. The cavity size was normal. Wall thickness was increased in a pattern of mild LVH. - Mitral valve: Calcified annulus. Mildly thickened leaflets .  06/2017 cath  Dist LM to Ost LAD lesion is 70% stenosed.  Prox LAD to Mid LAD lesion is 30% stenosed.  Mid LAD to Dist LAD lesion is 100% stenosed.  Ost Cx lesion is 70% stenosed.  Prox RCA lesion is 100% stenosed.  LV end diastolic pressure is moderately elevated.  Balloon angioplasty was performed using a BALLOON EUPHORA [id number].  Post intervention, there is a 100% residual stenosis.  1. 3 vessel obstructive CAD. - culprit lesion is occlusion of distal LAD.  Unsuccessful attempt at PTCA- unable to restore flow despite balloon angioplasty. - 70% ostial LAD with concentric calcified plaque. Minimal lumen area 5.2 mm squared by IVUS - approximately 70% ostial LCx disease. Unable to cross with IVUS catheter. - CTO of the proximal RCA with good left to right collaterals 2. Moderately elevated LVEDP 3. Persistent Afib   Plan: aggressive medical management. Assess LV functio by Echo. It is unclear the significance of the ostial LAD and LCx. If he has refractory angina I would consider referral for CABG. If symptoms improve I would consider ischemic work up with a stress myoview in the near future to assess need for further revascularization.  06/2017 MRA IMPRESSION: Interval occlusion of the left internal carotid artery with absence of antegrade flow at the skullbase. Diminished but present flow in the anterior circulation on the left probably secondary to patent anterior and posterior communicating arteries.  Stenosis of the A1 segment on the right, additional importance based on the relevance to collateral flow to the left hemisphere.  Approximately 70% stenosis of the right vertebral artery at the foramen magnum level. Antegrade flow does persist. Dominant left vertebral artery freely supplies the basilar.  05/2016 AAA US IMPRESSION: Limited exam due to patient's body habitus. 3.0 cm distal abdominal aortic aneurysm is present. Recommend followup by ultrasound in 3 years. This recommendation follows ACR consensus guidelines: White Paper of the ACR Incidental Findings Committee II on Vascular     Assessment and Plan  1.Chronic diastolic HF - mild edema due to recent increased sodium intake, counseled ok to take additional lasix as needed   2. CAD -no recent symptoms, continue current meds  3. HTN - elevated in clinic, on  manual repeat 125/80. Continue current meds  4. Hyperlipidemia - intolerant to statins,  continue zetia.  - [facility]sk9 [facility] program  5. Afib - no symptoms, continue current meds.   6. Carotid stenosis - repeat carotid US next year   7. AAA - needs repeat US in 05/20/2019. CKD II - continue to monitor labs, particularly on diuretics.      Jose [name], M.D.

## 2018-03-20 NOTE — Patient Instructions (Signed)
Your physician wants you to follow-up in: [place] will receive a reminder letter in the mail two months in advance. If you don't receive a letter, please call our office to schedule the follow-up appointment.  Your physician has recommended you make the following change in your medication:   TAKE LASIX 60 MG TWICE DAILY FOR 3 DAYS THEN RESUME 40 MG TWICE DAILY  Thank you for choosing [facility]!!

## 2018-03-21 ENCOUNTER — Ambulatory Visit: Payer: Medicare HMO | Admitting: Cardiology

## 2018-03-29 ENCOUNTER — Encounter: Payer: Self-pay | Admitting: Cardiology

## 2018-04-03 ENCOUNTER — Ambulatory Visit: Payer: Medicare HMO | Admitting: Family Medicine

## 2018-04-04 ENCOUNTER — Encounter: Payer: Self-pay | Admitting: Family Medicine

## 2018-04-08 ENCOUNTER — Other Ambulatory Visit: Payer: Self-pay | Admitting: Family Medicine

## 2018-04-10 ENCOUNTER — Ambulatory Visit (INDEPENDENT_AMBULATORY_CARE_PROVIDER_SITE_OTHER): Payer: Medicare HMO | Admitting: Family Medicine

## 2018-04-10 ENCOUNTER — Encounter: Payer: Self-pay | Admitting: Family Medicine

## 2018-04-10 VITALS — BP 158/77 | HR 97 | Temp 97.4°F | Ht 72.0 in | Wt 293.8 lb

## 2018-04-10 DIAGNOSIS — E039 Hypothyroidism, unspecified: Secondary | ICD-10-CM | POA: Insufficient documentation

## 2018-04-10 DIAGNOSIS — Z794 Long term (current) use of insulin: Secondary | ICD-10-CM

## 2018-04-10 DIAGNOSIS — I5032 Chronic diastolic (congestive) heart failure: Secondary | ICD-10-CM | POA: Diagnosis not present

## 2018-04-10 DIAGNOSIS — E785 Hyperlipidemia, unspecified: Secondary | ICD-10-CM | POA: Diagnosis not present

## 2018-04-10 DIAGNOSIS — E1159 Type 2 diabetes mellitus with other circulatory complications: Secondary | ICD-10-CM | POA: Diagnosis not present

## 2018-04-10 DIAGNOSIS — E1169 Type 2 diabetes mellitus with other specified complication: Secondary | ICD-10-CM | POA: Diagnosis not present

## 2018-04-10 DIAGNOSIS — N183 Chronic kidney disease, stage 3 unspecified: Secondary | ICD-10-CM

## 2018-04-10 DIAGNOSIS — J449 Chronic obstructive pulmonary disease, unspecified: Secondary | ICD-10-CM | POA: Diagnosis not present

## 2018-04-10 DIAGNOSIS — I10 Essential (primary) hypertension: Secondary | ICD-10-CM

## 2018-04-10 DIAGNOSIS — G4733 Obstructive sleep apnea (adult) (pediatric): Secondary | ICD-10-CM | POA: Diagnosis not present

## 2018-04-10 DIAGNOSIS — I152 Hypertension secondary to endocrine disorders: Secondary | ICD-10-CM

## 2018-04-10 MED ORDER — FREESTYLE LIBRE 14 DAY SENSOR MISC: 1.0000 | 3 refills | Status: DC

## 2018-04-10 MED ORDER — MUPIROCIN CALCIUM 2 % EX CREA: 1.0000 "application " | TOPICAL_CREAM | Freq: Two times a day (BID) | CUTANEOUS | 0 refills | Status: DC

## 2018-04-10 MED ORDER — FREESTYLE LIBRE 14 DAY READER DEVI: 1.0000 | Freq: Four times a day (QID) | 1 refills | Status: DC

## 2018-04-10 MED ORDER — METOPROLOL SUCCINATE ER 25 MG PO TB24: 12.5000 mg | ORAL_TABLET | Freq: Every day | ORAL | 2 refills | Status: DC

## 2018-04-10 MED ORDER — ISOSORBIDE MONONITRATE ER 30 MG PO TB24: 15.0000 mg | ORAL_TABLET | Freq: Every day | ORAL | 2 refills | Status: DC

## 2018-04-10 MED ORDER — LEVOTHYROXINE SODIUM 50 MCG PO TABS: ORAL_TABLET | ORAL | 2 refills | Status: DC

## 2018-04-10 NOTE — Progress Notes (Signed)
BP (!) 158/77   Pulse 97   Temp (!) 97.4 F (36.3 C) (Oral)   Ht 6' (1.829 m)   Wt 293 lb 12.8 oz (133.3 kg)   BMI 39.85 kg/m    Subjective:    Patient ID: Jose [name], male    DOB: May 09, 1951, 67 y.o.   MRN: [id number]  HPI: Jose [name] is a 67 y.o. male presenting on 04/10/2018 for Diabetes (3 month follow up)   HPI Type 2 diabetes mellitus Patient comes in today for recheck of his diabetes. Patient has been currently taking Lantus and NovoLog and metformin and glipizide. Patient is not currently on an ACE inhibitor/ARB. Patient has not seen an ophthalmologist this year. Patient denies any issues with their feet.  Patient likely could be on an ACE inhibitor, I do not see a reason as to why he is not except that he did have stage III renal disease but now looks like it slightly improved to early stage III.  Hyperlipidemia Patient is coming in for recheck of his hyperlipidemia. The patient is currently taking omega-3 and Zetia. They deny any issues with myalgias or history of liver damage from it. They deny any focal numbness or weakness or chest pain.   Hypertension Patient is currently on Imdur and metoprolol, and their blood pressure today is 158/77. Patient denies any lightheadedness or dizziness. Patient denies headaches, blurred vision, chest pains, shortness of breath, or weakness. Denies any side effects from medication and is content with current medication.  Patient has known CHF and sees a cardiologist for blood pressure and for CHF, he has been stable currently but it does look like he has had a recent cardiac work-up that shows underlying CAD and possible need for CABG in the near future.  COPD Patient is coming in for COPD recheck today.  He is currently on Spiriva.  He has a mild chronic cough but denies any major coughing spells or wheezing spells.  He has 0nighttime symptoms per week and 3daytime symptoms per week currently.   Chronic renal disease Patient has  chronic renal disease with diabetes, his renal function has improved recently.  Patient does take Lasix for CHF but the renal disease has recently improved.  Patient says he is making good urine.  Obstructive sleep apnea Patient has obstructive sleep apnea and has a machine but has not been using it because he needs new equipment and clean, he would like a referral to somebody outside of the [place] system.  Hypothyroidism recheck Patient is coming in for thyroid recheck today as well. They deny any issues with hair changes or heat or cold problems or diarrhea or constipation. They deny any chest pain or palpitations. They are currently on levothyroxine 50mcrograms   Relevant past medical, surgical, family and social history reviewed and updated as indicated. Interim medical history since our last visit reviewed. Allergies and medications reviewed and updated.  Review of Systems  Constitutional: Negative for chills and fever.  HENT: Negative for congestion, sinus pressure and sore throat.   Respiratory: Positive for cough. Negative for shortness of breath and wheezing.   Cardiovascular: Negative for chest pain and leg swelling.  Musculoskeletal: Negative for back pain and gait problem.  Skin: Negative for rash.  Psychiatric/Behavioral: Positive for sleep disturbance. Negative for self-injury and suicidal ideas.  All other systems reviewed and are negative.   Per HPI unless specifically indicated above   Allergies as of 04/10/2018      Reactions  Amiodarone    Weakness during 2018 admission   Atorvastatin    Tremors   Simvastatin    Muscle weakness      Medication List        Accurate as of 04/10/18  9:18 AM. Always use your most recent med list.          apixaban 5 MG Tabs tablet Commonly known as:  ELIQUIS Take 1 tablet (5 mg total) by mouth 2 (two) times daily.   clopidogrel 75 MG tablet Commonly known as:  PLAVIX Take 1 tablet (75 mg total) by mouth daily.     ergocalciferol 50000 units capsule Commonly known as:  VITAMIN D2 Take 50,000 Units by mouth See admin instructions. Take one twice a week on wednesdays and saturdays   ezetimibe 10 MG tablet Commonly known as:  ZETIA Take 1 tablet (10 mg total) by mouth daily.   ferrous sulfate 325 (65 FE) MG tablet Take 325 mg by mouth 3 (three) times daily with meals.   Fish Oil 1000 MG Caps Take 2,000-3,000 mg See admin instructions by mouth. Take 2000 mg every morning Take 3000 mg at noon and evening.   furosemide 40 MG tablet Commonly known as:  LASIX Take 40 mg by mouth 2 (two) times daily.   glipiZIDE 5 MG tablet Commonly known as:  GLUCOTROL Take 5 mg by mouth 2 (two) times daily.   ICAPS AREDS 2 Caps Take 1 capsule by mouth 2 (two) times daily.   insulin aspart 100 UNIT/ML injection Commonly known as:  novoLOG Inject 30 Units into the skin 3 (three) times daily before meals.   insulin glargine 100 UNIT/ML injection Commonly known as:  LANTUS Inject 0.3-0.6 mLs (30-60 Units total) into the skin See admin instructions. 50 units in the AM and 60 units in the PM   isosorbide mononitrate 30 MG 24 hr tablet Commonly known as:  IMDUR Take 0.5 tablets (15 mg total) by mouth daily.   levothyroxine 50 MCG tablet Commonly known as:  SYNTHROID, LEVOTHROID TAKE 1 TABLET BY MOUTH ONCE DAILY BEFORE BREAKFAST   magnesium oxide 400 MG tablet Commonly known as:  MAG-OX Take 1 tablet (400 mg total) by mouth daily.   metFORMIN 1000 MG tablet Commonly known as:  GLUCOPHAGE Take 1 tablet (1,000 mg total) by mouth 2 (two) times daily with a meal.   metoprolol succinate 25 MG 24 hr tablet Commonly known as:  TOPROL-XL Take 0.5 tablets (12.5 mg total) by mouth daily.   nitroGLYCERIN 0.4 MG SL tablet Commonly known as:  NITROSTAT Place 1 tablet (0.4 mg total) under the tongue every 5 (five) minutes x 3 doses as needed for chest pain.   oxyCODONE-acetaminophen 5-325 MG tablet Commonly  known as:  PERCOCET/ROXICET Take 2 tablets by mouth every 6 (six) hours as needed. Take eight tablets daily as needed for pain per family and list. For bad  Back pain   sertraline 100 MG tablet Commonly known as:  ZOLOFT Take 100 mg by mouth daily.   tiotropium 18 MCG inhalation capsule Commonly known as:  SPIRIVA Place 18 mcg into inhaler and inhale at bedtime.   vitamin B-12 1000 MCG tablet Commonly known as:  CYANOCOBALAMIN Take 1,000 mcg by mouth daily.          Objective:    BP (!) 203/93   Pulse 97   Temp (!) 97.4 F (36.3 C) (Oral)   Ht 6' (1.829 m)   Wt 293 lb 12.8 oz (133.3  kg)   BMI 39.85 kg/m   Wt Readings from Last 3 Encounters:  04/10/18 293 lb 12.8 oz (133.3 kg)  03/20/18 (!) 301 lb 6.4 oz (136.7 kg)  01/04/18 292 lb (132.5 kg)    Physical Exam  Constitutional: He is oriented to person, place, and time. He appears well-developed and well-nourished. No distress.  Eyes: Conjunctivae are normal. No scleral icterus.  Neck: Neck supple. No thyromegaly present.  Cardiovascular: Normal rate, regular rhythm, normal heart sounds and intact distal pulses.  No murmur heard. Pulmonary/Chest: Effort normal and breath sounds normal. No respiratory distress. He has no wheezes. He has no rales.  Musculoskeletal: Normal range of motion. He exhibits no edema.  Lymphadenopathy:    He has no cervical adenopathy.  Neurological: He is alert and oriented to person, place, and time. Coordination normal.  Skin: Skin is warm and dry. No rash noted. He is not diaphoretic.  Psychiatric: He has a normal mood and affect. His behavior is normal.  Nursing note and vitals reviewed.       Assessment & Plan:   Problem List Items Addressed This Visit      Cardiovascular and Mediastinum   Hypertension associated with diabetes ([place])   Relevant Medications   isosorbide mononitrate (IMDUR) 30 MG 24 hr tablet   metoprolol succinate (TOPROL-XL) 25 MG 24 hr tablet   Other Relevant  Orders   CMP14+EGFR (Completed)   Chronic diastolic CHF (congestive heart failure) (HCC)   Relevant Medications   isosorbide mononitrate (IMDUR) 30 MG 24 hr tablet   metoprolol succinate (TOPROL-XL) 25 MG 24 hr tablet   Other Relevant Orders   CMP14+EGFR (Completed)     Respiratory   COPD (chronic obstructive pulmonary disease) (HCC)   OSA (obstructive sleep apnea)   Relevant Orders   Ambulatory referral to Sleep Studies     Endocrine   Hyperlipidemia associated with type 2 diabetes mellitus (HCC)   Relevant Medications   isosorbide mononitrate (IMDUR) 30 MG 24 hr tablet   metoprolol succinate (TOPROL-XL) 25 MG 24 hr tablet   Other Relevant Orders   Lipid panel (Completed)   Type 2 diabetes mellitus, controlled ([place]) - Primary   Relevant Medications   Continuous Blood Gluc Receiver (FREESTYLE LIBRE 14 DAY READER) DEVI   Continuous Blood Gluc Sensor (FREESTYLE LIBRE 14 DAY SENSOR) MISC   Other Relevant Orders   Bayer DCA Hb A1c Waived (Completed)   CMP14+EGFR (Completed)   Hypothyroidism   Relevant Medications   metoprolol succinate (TOPROL-XL) 25 MG 24 hr tablet   levothyroxine (SYNTHROID, LEVOTHROID) 50 MCG tablet   Other Relevant Orders   TSH (Completed)     Genitourinary   CRI (chronic renal insufficiency), stage 3 (moderate) (HCC)   Relevant Orders   CMP14+EGFR (Completed)     Other   Morbid obesity ([place])     Patient just took his blood pressure medication less than 20 minutes ago which is probably why it elevated, we will recheck one more time at the end of the visit and then he also has a cardiologist that follows up closely on this.  Recheck renal function with labs today patient has known chronic renal disease.  Recheck thyroid  Recheck A1c, it sounds like he is running slightly higher, we may have to adjust his insulin but will try for freestyle libre so we can get some better numbers.  Follow up plan: Return in about 3 months (around 07/11/2018), or if  symptoms worsen or fail to improve, for  Diabetes and kidney disease and hypertension recheck.  Counseling provided for all of the vaccine components No orders of the defined types were placed in this encounter.   Caryl Pina, MD [facility] 04/10/2018, 9:18 AM

## 2018-04-11 LAB — CMP14+EGFR
ALT: 20 IU/L (ref 0–44)
AST: 16 IU/L (ref 0–40)
Albumin/Globulin Ratio: 1.4 (ref 1.2–2.2)
Albumin: 3.8 g/dL (ref 3.6–4.8)
Alkaline Phosphatase: 50 IU/L (ref 39–117)
BILIRUBIN TOTAL: 0.3 mg/dL (ref 0.0–1.2)
BUN/Creatinine Ratio: 23 (ref 10–24)
BUN: 31 mg/dL — AB (ref 8–27)
CHLORIDE: 95 mmol/L — AB (ref 96–106)
CO2: 26 mmol/L (ref 20–29)
Calcium: 9.6 mg/dL (ref 8.6–10.2)
Creatinine, Ser: 1.33 mg/dL — ABNORMAL HIGH (ref 0.76–1.27)
GFR calc non Af Amer: 55 mL/min/{1.73_m2} — ABNORMAL LOW (ref >59)
GFR, EST AFRICAN AMERICAN: 63 mL/min/{1.73_m2} (ref >59)
GLUCOSE: 229 mg/dL — AB (ref 65–99)
Globulin, Total: 2.8 g/dL (ref 1.5–4.5)
Potassium: 5.4 mmol/L — ABNORMAL HIGH (ref 3.5–5.2)
Sodium: 135 mmol/L (ref 134–144)
TOTAL PROTEIN: 6.6 g/dL (ref 6.0–8.5)

## 2018-04-11 LAB — BAYER DCA HB A1C WAIVED: HB A1C: 7 % — AB (ref <7.0)

## 2018-04-11 LAB — LIPID PANEL
Chol/HDL Ratio: 10.3 ratio — ABNORMAL HIGH (ref 0.0–5.0)
Cholesterol, Total: 279 mg/dL — ABNORMAL HIGH (ref 100–199)
HDL: 27 mg/dL — AB (ref >39)
Triglycerides: 629 mg/dL (ref 0–149)

## 2018-04-11 LAB — TSH: TSH: 2.08 u[IU]/mL (ref 0.450–4.500)

## 2018-04-12 ENCOUNTER — Telehealth: Payer: Self-pay | Admitting: Neurology

## 2018-04-12 NOTE — Telephone Encounter (Signed)
The pt did not want this visit to be filed under the [place]. The pt requested to see a sleep physician outside of the [place]. The pt's [employer] was cancelled as his primary insurance so it would only be filed through [employer]. The pt and his wife verbally agreed to this and understood only [facility] would be filed.

## 2018-06-01 ENCOUNTER — Institutional Professional Consult (permissible substitution): Payer: Medicare HMO | Admitting: Neurology

## 2018-06-02 ENCOUNTER — Encounter: Payer: Self-pay | Admitting: Neurology

## 2018-07-04 ENCOUNTER — Encounter: Payer: Self-pay | Admitting: Neurology

## 2018-07-04 ENCOUNTER — Institutional Professional Consult (permissible substitution): Payer: Medicare HMO | Admitting: Neurology

## 2018-07-05 ENCOUNTER — Encounter: Payer: Self-pay | Admitting: Neurology

## 2018-07-05 ENCOUNTER — Ambulatory Visit (INDEPENDENT_AMBULATORY_CARE_PROVIDER_SITE_OTHER): Payer: Medicare HMO | Admitting: Neurology

## 2018-07-05 VITALS — BP 160/88 | HR 100 | Ht 72.0 in | Wt 298.0 lb

## 2018-07-05 DIAGNOSIS — I6330 Cerebral infarction due to thrombosis of unspecified cerebral artery: Secondary | ICD-10-CM | POA: Diagnosis not present

## 2018-07-05 DIAGNOSIS — J438 Other emphysema: Secondary | ICD-10-CM | POA: Diagnosis not present

## 2018-07-05 DIAGNOSIS — I5043 Acute on chronic combined systolic (congestive) and diastolic (congestive) heart failure: Secondary | ICD-10-CM | POA: Diagnosis not present

## 2018-07-05 DIAGNOSIS — I63232 Cerebral infarction due to unspecified occlusion or stenosis of left carotid arteries: Secondary | ICD-10-CM | POA: Diagnosis not present

## 2018-07-05 DIAGNOSIS — I2510 Atherosclerotic heart disease of native coronary artery without angina pectoris: Secondary | ICD-10-CM | POA: Diagnosis not present

## 2018-07-05 DIAGNOSIS — I2583 Coronary atherosclerosis due to lipid rich plaque: Secondary | ICD-10-CM

## 2018-07-05 DIAGNOSIS — Z9989 Dependence on other enabling machines and devices: Secondary | ICD-10-CM

## 2018-07-05 DIAGNOSIS — G4719 Other hypersomnia: Secondary | ICD-10-CM | POA: Diagnosis not present

## 2018-07-05 DIAGNOSIS — G4733 Obstructive sleep apnea (adult) (pediatric): Secondary | ICD-10-CM | POA: Diagnosis not present

## 2018-07-05 NOTE — Progress Notes (Signed)
SLEEP MEDICINE CLINIC   Provider:  Larey Seat, M D  Primary Care Physician:  Dettinger, Fransisca Kaufmann, MD   Referring Provider: Dettinger, Fransisca Kaufmann, MD   Chief Complaint  Patient presents with  . New Patient (Initial Visit)    pt usually goes to [place] va to [place] and is now attempting to transfer care here to be more local. sleep studies have been completed through the [place] last one was 3 years ago. here to transfer care to local DME and to establish care for cpap supplies. do not have copies of sleep studies.    HPI:  Jose [name] is a 67 y.o. male patient , seen here on 07-05-2018 in a referral from Dr. Warrick Parisian for transfer of Care.   Jose [name] has been followed for primary care and sleep care by the [place] in [place].   0) OSA, diagnosed in 2012 through the [place] and later dx as overlapping with COPD.he has used CPAP initially with oxygen, later received a V Auto S 10 - ? BiPAP machine? since then oxygen was not used any longer.  1) He has followed the stroke specialist Dr.Sethi and Dr. Erlinda Hong. TIA in less than 5 years ago.  2)Jose [name] has a history of hypertension and is currently on indoor and metoprolol, he has a history of congestive heart failure which has been stable and controlled recent cardiac work-ups have documented an underlying coronary artery disease and possible need for an coronary artery bypass graft in the near future.  For now he is on medical management as he was not in shape to undergo surgery. 3)He has hyperlipidemia he is taking Zetia.   4)He has COPD; Currently on Spiriva chronic mild cough but no major coughing spells or associated Valsalva related symptoms, he has denied good nighttime symptoms.  5) The patient also has hypothyroidism. 6) Chronic renal disease takes Lasix for CHF but the renal and disease has recently improved he is also neither polyuric nor oligouric. RF seemed to have decreased during a prolonged hospitalization, after heart attack, corotid  artery blockage, and TIA  Under 'too much BP control medication". 7) DM- contributing to vascular disease.    Chief complaint according to patient : I need supplies, have a machine that is 28-9 years old and I like it "     07-05-2018 - The patient remains excessively daytime sleepy with an Epworth Sleepiness Scale endorsed at 18 points fatigue severity only at 22 points.  He is using very compliantly and air curve V auto machine, this is a BiPAP set at an inspiratory pressure of 14 expiratory pressure of 10 cmH2O with an easy breathing function.  Average user time is 9 hours 23 minutes, as stated he is 100% compliant.  Residual AHI is 0.4.  There are major air leaks however that I  attribute to his mask not fitting well.  Sleep habits are as follows: Dinner time around 5 PM, staying home and not exercising.  The couple's bedtime is at 11-12 midnight. Bedroom is cool, quiet and dark. He sleeps supine with CPAP, and stays on one position. The head of the bed is raised at 15 degrees and he uses 2 pillows.  He urinates at least once at night, after 3 hours of sleep, and some nights again after 3 hours. He rises at 10 AM unless his wife wakes him earlier.  He sleeps easily 8-10 hours. No snoring o CPAP. No vivid dreams- rarely remembers dreams. Headaches in  AM are very rare , and he has some phlegm. He was not a snorer but had central apnea.   Medical history :  See above   Social history:  Married, 2 sons age 69 and 22.  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  The patient quit smoking but had 45-year pack history, he drinks alcohol very rarely and is not drinking now, he has of coffee every morning but is not a soda or ice tea drinker.   Review of Systems: Out of a complete 14 system review, the patient complains of only the following symptoms, and all other reviewed systems are negative.  dysphonia, SOB,  gait disorder- failed back surgery and neuropathy.  Epworth Sleepiness Score:18/ 24  , Fatigue severity score 22/ 63   , depression score n/a    Social History   Socioeconomic History  . Marital status: Married    Spouse name: Not on file  . Number of children: Not on file  . Years of education: Not on file  . Highest education level: Not on file  Occupational History  . Not on file  Social Needs  . Financial resource strain: Not on file  . Food insecurity:    Worry: Not on file    Inability: Not on file  . Transportation needs:    Medical: Not on file    Non-medical: Not on file  Tobacco Use  . Smoking status: Former Smoker    Packs/day: 3.00    Years: 45.00    Pack years: 135.00    Types: Cigarettes    Last attempt to quit: 08/31/2007    Years since quitting: 10.8  . Smokeless tobacco: Never Used  Substance and Sexual Activity  . Alcohol use: No  . Drug use: No  . Sexual activity: Not on file  Lifestyle  . Physical activity:    Days per week: Not on file    Minutes per session: Not on file  . Stress: Not on file  Relationships  . Social connections:    Talks on phone: Not on file    Gets together: Not on file    Attends religious service: Not on file    Active member of club or organization: Not on file    Attends meetings of clubs or organizations: Not on file    Relationship status: Not on file  . Intimate partner violence:    Fear of current or ex partner: Not on file    Emotionally abused: Not on file    Physically abused: Not on file    Forced sexual activity: Not on file  Other Topics Concern  . Not on file  Social History Narrative  . Not on file    Family History  Problem Relation Age of Onset  . Hypertension Father   . Heart disease Father   . Cancer Mother        uterine  . Diabetes Mother   . Diabetes Brother   . Cancer Brother        lymphoma  . Diabetes Brother     Past Medical History:  Diagnosis Date  . Anemia   . Anxiety   .  CAD (coronary artery disease)    a. prior silent inferior MI, NSTEMI 07/2014 with chronically occluded RCA s/p unsuccessful PCI. b. STEMI 07/2017 with multivessel disease s/p failed PTCA of distal LAD.  [name] Kitchen Carotid artery disease ([place])   .  Cataract   . Chronic combined systolic and diastolic CHF (congestive heart failure) ([place])   . Chronic respiratory failure ([place])    a. Placed on home O2 01/2016.  [name] Kitchen CKD (chronic kidney disease), stage III ([place])   . COPD (chronic obstructive pulmonary disease) ([place])   . Diabetes mellitus ([place])   . Former tobacco use   . Hyperlipidemia   . Hypertension   . Hyponatremia   . Morbid obesity ([place])   . Neuropathy   . PAF (paroxysmal atrial fibrillation) ([place])    a. dx 06/2017.  [name] Kitchen Sleep apnea   . Stroke ([place]) 06/2017  . TIA (transient ischemic attack) 2017   a. hemispheric L carotid artery syndrome in setting of hypotension.    Past Surgical History:  Procedure Laterality Date  . BACK SURGERY    . CORONARY BALLOON ANGIOPLASTY N/A 07/14/2017   Procedure: CORONARY BALLOON ANGIOPLASTY;  Surgeon: Martinique, Peter M, MD;  Location: [facility];  Service: Cardiovascular;  Laterality: N/A;  . EYE SURGERY    . INTRAVASCULAR ULTRASOUND/IVUS N/A 07/14/2017   Procedure: Intravascular Ultrasound/IVUS;  Surgeon: Martinique, Peter M, MD;  Location: [facility];  Service: Cardiovascular;  Laterality: N/A;  . LEFT HEART CATH AND CORONARY ANGIOGRAPHY N/A 07/14/2017   Procedure: LEFT HEART CATH AND CORONARY ANGIOGRAPHY;  Surgeon: Martinique, Peter M, MD;  Location: [facility];  Service: Cardiovascular;  Laterality: N/A;  . LEFT HEART CATHETERIZATION WITH CORONARY ANGIOGRAM N/A 08/28/2014   Procedure: LEFT HEART CATHETERIZATION WITH CORONARY ANGIOGRAM;  Surgeon: Clent Demark, MD;  Location: [facility];  Service: Cardiovascular;  Laterality: N/A;  . PERIPHERAL VASCULAR CATHETERIZATION Bilateral 03/22/2016   Procedure: Carotid Angiography;  Surgeon: Elam Dutch, MD;  Location: [facility];  Service: Cardiovascular;  Laterality: Bilateral;    Current Outpatient Medications  Medication Sig Dispense Refill  . apixaban (ELIQUIS) 5 MG TABS tablet Take 1 tablet (5 mg total) by mouth 2 (two) times daily. 28 tablet 0  . clopidogrel (PLAVIX) 75 MG tablet Take 1 tablet (75 mg total) by mouth daily. 30 tablet 2  . Continuous Blood Gluc Receiver (FREESTYLE LIBRE 14 DAY READER) DEVI 1 each by Does not apply route 4 (four) times daily. 1 Device 1  . Continuous Blood Gluc Sensor (FREESTYLE LIBRE 14 DAY SENSOR) MISC 1 each by Does not apply route every 14 (fourteen) days. 6 each 3  . ergocalciferol (VITAMIN D2) 50000 UNITS capsule Take 50,000 Units by mouth See admin instructions. Take one twice a week on wednesdays and saturdays    . ezetimibe (ZETIA) 10 MG tablet Take 1 tablet (10 mg total) by mouth daily. 30 tablet 11  . ferrous sulfate 325 (65 FE) MG tablet Take 325 mg by mouth 3 (three) times daily with meals.    . furosemide (LASIX) 40 MG tablet Take 40 mg by mouth 2 (two) times daily.    [name] Kitchen glipiZIDE (GLUCOTROL) 5 MG tablet Take 5 mg by mouth 2 (two) times daily.     . insulin aspart (NOVOLOG) 100 UNIT/ML injection Inject 30 Units into the skin 3 (three) times daily before meals.     . insulin glargine (LANTUS) 100 UNIT/ML injection Inject 0.3-0.6 mLs (30-60 Units total) into the skin See admin instructions. 50 units in the AM and 60 units in the PM (Patient taking differently: Inject 50-60 Units into the skin See admin instructions. 50 units in the AM and 60 units in the PM) 10 mL  11  . isosorbide mononitrate (IMDUR) 30 MG 24 hr tablet Take 0.5 tablets (15 mg total) by mouth daily. 90 tablet 2  . levothyroxine (SYNTHROID, LEVOTHROID) 50 MCG tablet TAKE 1 TABLET BY MOUTH ONCE DAILY BEFORE BREAKFAST 90 tablet 2  . magnesium oxide (MAG-OX) 400 MG tablet Take 1 tablet (400 mg total) by mouth daily. 34 tablet 3  . metFORMIN (GLUCOPHAGE) 1000 MG tablet  Take 1 tablet (1,000 mg total) by mouth 2 (two) times daily with a meal. 60 tablet 3  . metoprolol succinate (TOPROL-XL) 25 MG 24 hr tablet Take 0.5 tablets (12.5 mg total) by mouth daily. 90 tablet 2  . Multiple Vitamins-Minerals (ICAPS AREDS 2) CAPS Take 1 capsule by mouth 2 (two) times daily.    . mupirocin cream (BACTROBAN) 2 % Apply 1 application topically 2 (two) times daily. 15 g 0  . nitroGLYCERIN (NITROSTAT) 0.4 MG SL tablet Place 1 tablet (0.4 mg total) under the tongue every 5 (five) minutes x 3 doses as needed for chest pain. 25 tablet 12  . Omega-3 Fatty Acids (FISH OIL) 1000 MG CAPS Take 2,000-3,000 mg See admin instructions by mouth. Take 2000 mg every morning Take 3000 mg at noon and evening.    [name] Kitchen oxyCODONE-acetaminophen (PERCOCET/ROXICET) 5-325 MG per tablet Take 2 tablets by mouth every 6 (six) hours as needed. Take eight tablets daily as needed for pain per family and list. For bad  Back pain    . sertraline (ZOLOFT) 100 MG tablet Take 100 mg by mouth daily.    [name] Kitchen tiotropium (SPIRIVA) 18 MCG inhalation capsule Place 18 mcg into inhaler and inhale at bedtime.     . vitamin B-12 (CYANOCOBALAMIN) 1000 MCG tablet Take 1,000 mcg by mouth daily.     No current facility-administered medications for this visit.     Allergies as of 07/05/2018 - Review Complete 07/05/2018  Allergen Reaction Noted  . Amiodarone  08/24/2017  . Atorvastatin  07/24/2017  . Simvastatin  07/14/2017    Vitals: BP (!) 160/88   Pulse 100   Ht 6' (1.829 m)   Wt 298 lb (135.2 kg)   BMI 40.42 kg/m  Last Weight:  Wt Readings from Last 1 Encounters:  07/05/18 298 lb (135.2 kg)   FAO:ZHYQ mass index is 40.42 kg/m.     Last Height:   Ht Readings from Last 1 Encounters:  07/05/18 6' (1.829 m)    Physical exam:  General: The patient is awake, alert and appears not in acute distress. The patient is well groomed. Head: Normocephalic, atraumatic. Neck is supple. Mallampati 3- no teeth. ,  neck  circumference:22" .  Nasal airflow congested ,  Retrognathia is  seen. Full facial hair.  Cardiovascular:  Regular rate and rhythm, without  murmurs or carotid bruit, and without distended neck veins. Respiratory: Lungs are not wheezing -  Skin:  With evidence of ankle edema,. Trunk: BMI is*The patient's posture is stooped ,   Neurologic exam : The patient is awake and alert, oriented to place and time.   Memory subjective described as intact.  Attention span & concentration ability appears normal.  Speech is fluent,  with dysarthria, dysphonia . Cranial nerves: Pupils are equal and briskly reactive to light. Retinopathy and status post 3 laser surgeries.  Extraocular movements  in vertical and horizontal planes intact and without nystagmus. Visual fields by finger perimetry are intact. Hearing to finger rub impaired  Facial sensation intact to fine touch.  Facial motor strength is symmetric  and tongue and uvula move midline. Shoulder shrug was symmetrical.   Motor exam:  Normal tone, muscle bulk and symmetric strength in all extremities. Sensory:  Fine touch, pinprick and vibration were tested  in his upper extremities - hands have numbness, a gloved feeling. [name] Kitchen Proprioception tested in the upper extremities was normal. Neuropathy involves lower extremities up to midcalf level- numbness to vibration  , pin prick, touch.   Coordination:  His penmanship has deteriorated. Rapid alternating movements in the fingers/hands were slowed. Cannot hold on to a pen. . Finger-to-nose maneuver  normal without evidence of ataxia, dysmetria or tremor.  Gait and station: deferred.    Assessment:  After physical and neurologic examination, review of laboratory studies,  Personal review of imaging studies, reports of other /same  Imaging studies, results of polysomnography and / or neurophysiology testing and pre-existing records as far as provided in visit., my assessment is   1) I need to see Jose [name] for a  repeat sleep study as long as I don't have access to his original sleep study and reports of sleep clinic visits form the [place].   2) He needs a FFM , airtouch  replacement.  3) I ordered his records, and if not available in 10 business days will schedule a PSG baseline with AHI split at 20.   The patient was advised of the nature of the diagnosed disorder , the treatment options and the  risks for general health and wellness arising from not treating the condition.   I spent more than 60 minutes of face to face time with the patient.  Greater than 50% of time was spent in counseling and coordination of care. We have discussed the diagnosis and differential and I answered the patient's questions.    Plan:  Treatment plan and additional workup :  Records ordered.  I will order a SPLIT night.  I will bring him to [facility] she can fit him for a mask , so he has a replacement in the interval time      Larey Seat, MD   73/02/1061, 6:94 PM  Certified in Neurology by ABPN Certified in [place] by [facility] [address] [place], [place] [address]

## 2018-07-06 ENCOUNTER — Telehealth: Payer: Self-pay | Admitting: *Deleted

## 2018-07-06 NOTE — Telephone Encounter (Signed)
R/C medical records from [facility], medical records on [place].

## 2018-07-11 ENCOUNTER — Ambulatory Visit (INDEPENDENT_AMBULATORY_CARE_PROVIDER_SITE_OTHER): Payer: Medicare HMO | Admitting: Family Medicine

## 2018-07-11 ENCOUNTER — Encounter: Payer: Self-pay | Admitting: Family Medicine

## 2018-07-11 VITALS — BP 117/67 | HR 103 | Temp 97.6°F | Ht 72.0 in | Wt 300.2 lb

## 2018-07-11 DIAGNOSIS — E781 Pure hyperglyceridemia: Secondary | ICD-10-CM | POA: Insufficient documentation

## 2018-07-11 DIAGNOSIS — E785 Hyperlipidemia, unspecified: Secondary | ICD-10-CM | POA: Diagnosis not present

## 2018-07-11 DIAGNOSIS — Z789 Other specified health status: Secondary | ICD-10-CM | POA: Diagnosis not present

## 2018-07-11 DIAGNOSIS — Z794 Long term (current) use of insulin: Secondary | ICD-10-CM

## 2018-07-11 DIAGNOSIS — E1169 Type 2 diabetes mellitus with other specified complication: Secondary | ICD-10-CM | POA: Diagnosis not present

## 2018-07-11 DIAGNOSIS — Z23 Encounter for immunization: Secondary | ICD-10-CM

## 2018-07-11 DIAGNOSIS — E1159 Type 2 diabetes mellitus with other circulatory complications: Secondary | ICD-10-CM | POA: Diagnosis not present

## 2018-07-11 DIAGNOSIS — E039 Hypothyroidism, unspecified: Secondary | ICD-10-CM

## 2018-07-11 LAB — BAYER DCA HB A1C WAIVED: HB A1C: 7.7 % — AB (ref <7.0)

## 2018-07-11 NOTE — Progress Notes (Signed)
BP (!) 162/81   Pulse (!) 103   Temp 97.6 F (36.4 C) (Oral)   Ht 6' (1.829 m)   Wt (!) 300 lb 3.2 oz (136.2 kg)   BMI 40.71 kg/m    Subjective:    Patient ID: Jose [name], male    DOB: May 19, 1951, 67 y.o.   MRN: [id number]  HPI: Jose [name] is a 67 y.o. male presenting on 07/11/2018 for Diabetes (3 month follow up. States after eating he has a lot of gas and heartburn.); Hyperlipidemia; and Hypertension   HPI Type 2 diabetes mellitus Patient comes in today for recheck of his diabetes. Patient has been currently taking Lantus 50 in the morning and 60 in the evening and NovoLog 30 3 times daily and glipizide and metformin. Patient is not currently on an ACE inhibitor/ARB. Patient has not seen an ophthalmologist this year. Patient denies any issues with their feet.   Hypothyroidism recheck Patient is coming in for thyroid recheck today as well. They deny any issues with hair changes or heat or cold problems or diarrhea or constipation. They deny any chest pain or palpitations. They are currently on levothyroxine 19micrograms   Hyperlipidemia Patient is coming in for recheck of his hyperlipidemia. The patient is currently taking Zetia and fish oils, has been intolerant to statins previously. They deny any issues with myalgias or history of liver damage from it. They deny any focal numbness or weakness or chest pain.   Relevant past medical, surgical, family and social history reviewed and updated as indicated. Interim medical history since our last visit reviewed. Allergies and medications reviewed and updated.  Review of Systems  Constitutional: Negative for chills and fever.  Eyes: Negative for visual disturbance.  Respiratory: Negative for shortness of breath and wheezing.   Cardiovascular: Negative for chest pain and leg swelling.  Musculoskeletal: Negative for back pain and gait problem.  Skin: Negative for rash.  Neurological: Negative for dizziness, weakness,  light-headedness and numbness.  All other systems reviewed and are negative.   Per HPI unless specifically indicated above   Allergies as of 07/11/2018      Reactions   Amiodarone    Weakness during 2018 admission   Atorvastatin    Tremors   Simvastatin    Muscle weakness      Medication List        Accurate as of 07/11/18 10:58 AM. Always use your most recent med list.          apixaban 5 MG Tabs tablet Commonly known as:  ELIQUIS Take 1 tablet (5 mg total) by mouth 2 (two) times daily.   clopidogrel 75 MG tablet Commonly known as:  PLAVIX Take 1 tablet (75 mg total) by mouth daily.   ergocalciferol 1.25 MG (50000 UT) capsule Commonly known as:  VITAMIN D2 Take 50,000 Units by mouth See admin instructions. Take one twice a week on wednesdays and saturdays   ezetimibe 10 MG tablet Commonly known as:  ZETIA Take 1 tablet (10 mg total) by mouth daily.   ferrous sulfate 325 (65 FE) MG tablet Take 325 mg by mouth 3 (three) times daily with meals.   Fish Oil 1000 MG Caps Take 2,000-3,000 mg See admin instructions by mouth. Take 2000 mg every morning Take 3000 mg at noon and evening.   FREESTYLE LIBRE 14 DAY READER Devi 1 each by Does not apply route 4 (four) times daily.   FREESTYLE LIBRE 14 DAY SENSOR Misc 1 each by  Does not apply route every 14 (fourteen) days.   furosemide 40 MG tablet Commonly known as:  LASIX Take 40 mg by mouth 2 (two) times daily.   glipiZIDE 5 MG tablet Commonly known as:  GLUCOTROL Take 5 mg by mouth 2 (two) times daily.   ICAPS AREDS 2 Caps Take 1 capsule by mouth 2 (two) times daily.   insulin aspart 100 UNIT/ML injection Commonly known as:  novoLOG Inject 30 Units into the skin 3 (three) times daily before meals.   insulin glargine 100 UNIT/ML injection Commonly known as:  LANTUS Inject 0.3-0.6 mLs (30-60 Units total) into the skin See admin instructions. 50 units in the AM and 60 units in the PM   isosorbide mononitrate  30 MG 24 hr tablet Commonly known as:  IMDUR Take 0.5 tablets (15 mg total) by mouth daily.   levothyroxine 50 MCG tablet Commonly known as:  SYNTHROID, LEVOTHROID TAKE 1 TABLET BY MOUTH ONCE DAILY BEFORE BREAKFAST   magnesium oxide 400 MG tablet Commonly known as:  MAG-OX Take 1 tablet (400 mg total) by mouth daily.   metFORMIN 1000 MG tablet Commonly known as:  GLUCOPHAGE Take 1 tablet (1,000 mg total) by mouth 2 (two) times daily with a meal.   metoprolol succinate 25 MG 24 hr tablet Commonly known as:  TOPROL-XL Take 0.5 tablets (12.5 mg total) by mouth daily.   mupirocin cream 2 % Commonly known as:  BACTROBAN Apply 1 application topically 2 (two) times daily.   nitroGLYCERIN 0.4 MG SL tablet Commonly known as:  NITROSTAT Place 1 tablet (0.4 mg total) under the tongue every 5 (five) minutes x 3 doses as needed for chest pain.   oxyCODONE-acetaminophen 5-325 MG tablet Commonly known as:  PERCOCET/ROXICET Take 2 tablets by mouth every 6 (six) hours as needed. Take eight tablets daily as needed for pain per family and list. For bad  Back pain   sertraline 100 MG tablet Commonly known as:  ZOLOFT Take 100 mg by mouth daily.   tiotropium 18 MCG inhalation capsule Commonly known as:  SPIRIVA Place 18 mcg into inhaler and inhale at bedtime.   vitamin B-12 1000 MCG tablet Commonly known as:  CYANOCOBALAMIN Take 1,000 mcg by mouth daily.          Objective:    BP (!) 162/81   Pulse (!) 103   Temp 97.6 F (36.4 C) (Oral)   Ht 6' (1.829 m)   Wt (!) 300 lb 3.2 oz (136.2 kg)   BMI 40.71 kg/m   Wt Readings from Last 3 Encounters:  07/11/18 (!) 300 lb 3.2 oz (136.2 kg)  07/05/18 298 lb (135.2 kg)  04/10/18 293 lb 12.8 oz (133.3 kg)    Physical Exam  Constitutional: He is oriented to person, place, and time. He appears well-developed and well-nourished. No distress.  Eyes: Conjunctivae are normal. No scleral icterus.  Neck: Neck supple. No thyromegaly present.    Cardiovascular: Normal rate, regular rhythm, normal heart sounds and intact distal pulses.  No murmur heard. Pulmonary/Chest: Effort normal and breath sounds normal. No respiratory distress. He has no wheezes.  Musculoskeletal: Normal range of motion. He exhibits no edema.  Lymphadenopathy:    He has no cervical adenopathy.  Neurological: He is alert and oriented to person, place, and time. Coordination normal.  Skin: Skin is warm and dry. No rash noted. He is not diaphoretic.  Psychiatric: He has a normal mood and affect. His behavior is normal.  Nursing note and  vitals reviewed.       Assessment & Plan:   Problem List Items Addressed This Visit      Endocrine   Hyperlipidemia associated with type 2 diabetes mellitus ([place])   Relevant Orders   Lipid panel   Type 2 diabetes mellitus, controlled ([place]) - Primary   Relevant Orders   Bayer DCA Hb A1c Waived   Hypothyroidism   Relevant Orders   TSH     Other   Morbid obesity ([place])   Hypertriglyceridemia   Relevant Orders   Lipid panel   Statin intolerance       Follow up plan: Return in about 3 months (around 10/11/2018), or if symptoms worsen or fail to improve, for Type 2 diabetes and hypothyroidism.  Counseling provided for all of the vaccine components Orders Placed This Encounter  Procedures  . Bayer DCA Hb A1c Waived  . TSH  . Lipid panel    Caryl Pina, MD [facility] 07/11/2018, 10:58 AM

## 2018-07-12 LAB — LIPID PANEL
CHOLESTEROL TOTAL: 267 mg/dL — AB (ref 100–199)
Chol/HDL Ratio: 9.2 ratio — ABNORMAL HIGH (ref 0.0–5.0)
HDL: 29 mg/dL — ABNORMAL LOW (ref >39)
TRIGLYCERIDES: 675 mg/dL — AB (ref 0–149)

## 2018-07-12 LAB — TSH: TSH: 2.36 u[IU]/mL (ref 0.450–4.500)

## 2018-08-08 ENCOUNTER — Telehealth: Payer: Self-pay

## 2018-08-08 NOTE — Telephone Encounter (Signed)
We have attempted to call the patient two times to schedule sleep study.  Patient has been unavailable at the phone numbers we have on file and has not returned our calls.  At this point we will send a letter asking patient to please contact the sleep lab to schedule their sleep study.  If patient calls back we will schedule them for their sleep study. 

## 2018-09-01 ENCOUNTER — Encounter (INDEPENDENT_AMBULATORY_CARE_PROVIDER_SITE_OTHER): Payer: Non-veteran care | Admitting: Ophthalmology

## 2018-09-13 ENCOUNTER — Encounter (INDEPENDENT_AMBULATORY_CARE_PROVIDER_SITE_OTHER): Payer: Medicare HMO | Admitting: Ophthalmology

## 2018-09-15 ENCOUNTER — Ambulatory Visit (INDEPENDENT_AMBULATORY_CARE_PROVIDER_SITE_OTHER): Payer: Medicare HMO | Admitting: Family Medicine

## 2018-09-15 ENCOUNTER — Encounter: Payer: Self-pay | Admitting: Family Medicine

## 2018-09-15 VITALS — BP 166/77 | HR 94 | Temp 97.9°F | Ht 72.0 in | Wt 304.0 lb

## 2018-09-15 DIAGNOSIS — E11621 Type 2 diabetes mellitus with foot ulcer: Secondary | ICD-10-CM | POA: Diagnosis not present

## 2018-09-15 DIAGNOSIS — E08621 Diabetes mellitus due to underlying condition with foot ulcer: Secondary | ICD-10-CM | POA: Diagnosis not present

## 2018-09-15 DIAGNOSIS — L97519 Non-pressure chronic ulcer of other part of right foot with unspecified severity: Secondary | ICD-10-CM

## 2018-09-15 DIAGNOSIS — L97429 Non-pressure chronic ulcer of left heel and midfoot with unspecified severity: Secondary | ICD-10-CM | POA: Diagnosis not present

## 2018-09-15 MED ORDER — SULFAMETHOXAZOLE-TRIMETHOPRIM 800-160 MG PO TABS: 1.0000 | ORAL_TABLET | Freq: Two times a day (BID) | ORAL | 0 refills | Status: AC

## 2018-09-15 MED ORDER — AMOXICILLIN-POT CLAVULANATE 875-125 MG PO TABS: 1.0000 | ORAL_TABLET | Freq: Two times a day (BID) | ORAL | 0 refills | Status: AC

## 2018-09-15 NOTE — Patient Instructions (Signed)
Diabetes Mellitus and Foot Care  Foot care is an important part of your health, especially when you have diabetes. Diabetes may cause you to have problems because of poor blood flow (circulation) to your feet and legs, which can cause your skin to:   Become thinner and drier.   Break more easily.   Heal more slowly.   Peel and crack.  You may also have nerve damage (neuropathy) in your legs and feet, causing decreased feeling in them. This means that you may not notice minor injuries to your feet that could lead to more serious problems. Noticing and addressing any potential problems early is the best way to prevent future foot problems.  How to care for your feet  Foot hygiene   Wash your feet daily with warm water and mild soap. Do not use hot water. Then, pat your feet and the areas between your toes until they are completely dry. Do not soak your feet as this can dry your skin.   Trim your toenails straight across. Do not dig under them or around the cuticle. File the edges of your nails with an emery board or nail file.   Apply a moisturizing lotion or petroleum jelly to the skin on your feet and to dry, brittle toenails. Use lotion that does not contain alcohol and is unscented. Do not apply lotion between your toes.  Shoes and socks   Wear clean socks or stockings every day. Make sure they are not too tight. Do not wear knee-high stockings since they may decrease blood flow to your legs.   Wear shoes that fit properly and have enough cushioning. Always look in your shoes before you put them on to be sure there are no objects inside.   To break in new shoes, wear them for just a few hours a day. This prevents injuries on your feet.  Wounds, scrapes, corns, and calluses   Check your feet daily for blisters, cuts, bruises, sores, and redness. If you cannot see the bottom of your feet, use a mirror or ask someone for help.   Do not cut corns or calluses or try to remove them with medicine.   If you  find a minor scrape, cut, or break in the skin on your feet, keep it and the skin around it clean and dry. You may clean these areas with mild soap and water. Do not clean the area with peroxide, alcohol, or iodine.   If you have a wound, scrape, corn, or callus on your foot, look at it several times a day to make sure it is healing and not infected. Check for:  ? Redness, swelling, or pain.  ? Fluid or blood.  ? Warmth.  ? Pus or a bad smell.  General instructions   Do not cross your legs. This may decrease blood flow to your feet.   Do not use heating pads or hot water bottles on your feet. They may burn your skin. If you have lost feeling in your feet or legs, you may not know this is happening until it is too late.   Protect your feet from hot and cold by wearing shoes, such as at the beach or on hot pavement.   Schedule a complete foot exam at least once a year (annually) or more often if you have foot problems. If you have foot problems, report any cuts, sores, or bruises to your health care provider immediately.  Contact a health care provider if:     You have a medical condition that increases your risk of infection and you have any cuts, sores, or bruises on your feet.   You have an injury that is not healing.   You have redness on your legs or feet.   You feel burning or tingling in your legs or feet.   You have pain or cramps in your legs and feet.   Your legs or feet are numb.   Your feet always feel cold.   You have pain around a toenail.  Get help right away if:   You have a wound, scrape, corn, or callus on your foot and:  ? You have pain, swelling, or redness that gets worse.  ? You have fluid or blood coming from the wound, scrape, corn, or callus.  ? Your wound, scrape, corn, or callus feels warm to the touch.  ? You have pus or a bad smell coming from the wound, scrape, corn, or callus.  ? You have a fever.  ? You have a red line going up your leg.  Summary   Check your feet every day  for cuts, sores, red spots, swelling, and blisters.   Moisturize feet and legs daily.   Wear shoes that fit properly and have enough cushioning.   If you have foot problems, report any cuts, sores, or bruises to your health care provider immediately.   Schedule a complete foot exam at least once a year (annually) or more often if you have foot problems.  This information is not intended to replace advice given to you by your health care provider. Make sure you discuss any questions you have with your health care provider.  Document Released: 08/13/2000 Document Revised: 09/28/2017 Document Reviewed: 09/17/2016  Elsevier Interactive Patient Education  2019 [employer]

## 2018-09-15 NOTE — Progress Notes (Signed)
Subjective:    Patient ID: Jose [name], male    DOB: September 22, 1950, 68 y.o.   MRN: [id number]  Chief Complaint:  Sores on both feet   HPI: Jose [name] is a 68 y.o. male presenting on 09/15/2018 for Sores on both feet  Pt presents today with complaints of sores on his feet. States he was seen by Dr. Warrick Parisian on 07-11-18 and he only had small places on the tops of his foot. Pts wife states she has been wrapping his legs and feet with ace wraps to decrease the swelling. States last night she noticed the large sore on his left foot and the open areas on the tops of both feet. Pt denies fever, chills, confusion, or weakness. Pt does have decreased sensation in his feet.   Relevant past medical, surgical, family, and social history reviewed and updated as indicated.  Allergies and medications reviewed and updated.   Past Medical History:  Diagnosis Date  . Anemia   . Anxiety   . CAD (coronary artery disease)    a. prior silent inferior MI, NSTEMI 07/2014 with chronically occluded RCA s/p unsuccessful PCI. b. STEMI 07/2017 with multivessel disease s/p failed PTCA of distal LAD.  [name] Kitchen Carotid artery disease ([place])   . Cataract   . Chronic combined systolic and diastolic CHF (congestive heart failure) ([place])   . Chronic respiratory failure ([place])    a. Placed on home O2 01/2016.  [name] Kitchen CKD (chronic kidney disease), stage III ([place])   . COPD (chronic obstructive pulmonary disease) ([place])   . Diabetes mellitus ([place])   . Former tobacco use   . Hyperlipidemia   . Hypertension   . Hyponatremia   . Morbid obesity ([place])   . Neuropathy   . PAF (paroxysmal atrial fibrillation) ([place])    a. dx 06/2017.  [name] Kitchen Sleep apnea   . Stroke ([place]) 06/2017  . TIA (transient ischemic attack) 2017   a. hemispheric L carotid artery syndrome in setting of hypotension.    Past Surgical History:  Procedure Laterality Date  . BACK SURGERY    . CORONARY BALLOON ANGIOPLASTY N/A 07/14/2017   Procedure: CORONARY BALLOON  ANGIOPLASTY;  Surgeon: Martinique, Peter M, MD;  Location: [facility];  Service: Cardiovascular;  Laterality: N/A;  . EYE SURGERY    . INTRAVASCULAR ULTRASOUND/IVUS N/A 07/14/2017   Procedure: Intravascular Ultrasound/IVUS;  Surgeon: Martinique, Peter M, MD;  Location: [facility];  Service: Cardiovascular;  Laterality: N/A;  . LEFT HEART CATH AND CORONARY ANGIOGRAPHY N/A 07/14/2017   Procedure: LEFT HEART CATH AND CORONARY ANGIOGRAPHY;  Surgeon: Martinique, Peter M, MD;  Location: [facility];  Service: Cardiovascular;  Laterality: N/A;  . LEFT HEART CATHETERIZATION WITH CORONARY ANGIOGRAM N/A 08/28/2014   Procedure: LEFT HEART CATHETERIZATION WITH CORONARY ANGIOGRAM;  Surgeon: Clent Demark, MD;  Location: [facility];  Service: Cardiovascular;  Laterality: N/A;  . PERIPHERAL VASCULAR CATHETERIZATION Bilateral 03/22/2016   Procedure: Carotid Angiography;  Surgeon: Elam Dutch, MD;  Location: [facility];  Service: Cardiovascular;  Laterality: Bilateral;    Social History   Socioeconomic History  . Marital status: Married    Spouse name: Not on file  . Number of children: Not on file  . Years of education: Not on file  . Highest education level: Not on file  Occupational History  . Not on file  Social Needs  . Financial resource strain: Not on file  . Food insecurity:  Worry: Not on file    Inability: Not on file  . Transportation needs:    Medical: Not on file    Non-medical: Not on file  Tobacco Use  . Smoking status: Former Smoker    Packs/day: 3.00    Years: 45.00    Pack years: 135.00    Types: Cigarettes    Last attempt to quit: 08/31/2007    Years since quitting: 11.0  . Smokeless tobacco: Never Used  Substance and Sexual Activity  . Alcohol use: No  . Drug use: No  . Sexual activity: Not on file  Lifestyle  . Physical activity:    Days per week: Not on file    Minutes per session: Not on file  . Stress: Not on file  Relationships  .  Social connections:    Talks on phone: Not on file    Gets together: Not on file    Attends religious service: Not on file    Active member of club or organization: Not on file    Attends meetings of clubs or organizations: Not on file    Relationship status: Not on file  . Intimate partner violence:    Fear of current or ex partner: Not on file    Emotionally abused: Not on file    Physically abused: Not on file    Forced sexual activity: Not on file  Other Topics Concern  . Not on file  Social History Narrative  . Not on file    Outpatient Encounter Medications as of 09/15/2018  Medication Sig  . apixaban (ELIQUIS) 5 MG TABS tablet Take 1 tablet (5 mg total) by mouth 2 (two) times daily.  . clopidogrel (PLAVIX) 75 MG tablet Take 1 tablet (75 mg total) by mouth daily.  . ergocalciferol (VITAMIN D2) 50000 UNITS capsule Take 50,000 Units by mouth See admin instructions. Take one twice a week on wednesdays and saturdays  . ezetimibe (ZETIA) 10 MG tablet Take 1 tablet (10 mg total) by mouth daily.  . ferrous sulfate 325 (65 FE) MG tablet Take 325 mg by mouth 3 (three) times daily with meals.  . furosemide (LASIX) 40 MG tablet Take 40 mg by mouth 2 (two) times daily.  [name] Kitchen glipiZIDE (GLUCOTROL) 5 MG tablet Take 5 mg by mouth 2 (two) times daily.   . insulin aspart (NOVOLOG) 100 UNIT/ML injection Inject 30 Units into the skin 3 (three) times daily before meals.   . insulin glargine (LANTUS) 100 UNIT/ML injection Inject 0.3-0.6 mLs (30-60 Units total) into the skin See admin instructions. 50 units in the AM and 60 units in the PM (Patient taking differently: Inject 50-60 Units into the skin See admin instructions. 50 units in the AM and 60 units in the PM)  . isosorbide mononitrate (IMDUR) 30 MG 24 hr tablet Take 0.5 tablets (15 mg total) by mouth daily.  [name] Kitchen levothyroxine (SYNTHROID, LEVOTHROID) 50 MCG tablet TAKE 1 TABLET BY MOUTH ONCE DAILY BEFORE BREAKFAST  . magnesium oxide (MAG-OX) 400 MG  tablet Take 1 tablet (400 mg total) by mouth daily.  . metFORMIN (GLUCOPHAGE) 1000 MG tablet Take 1 tablet (1,000 mg total) by mouth 2 (two) times daily with a meal.  . metoprolol succinate (TOPROL-XL) 25 MG 24 hr tablet Take 0.5 tablets (12.5 mg total) by mouth daily.  . Multiple Vitamins-Minerals (ICAPS AREDS 2) CAPS Take 1 capsule by mouth 2 (two) times daily.  . mupirocin cream (BACTROBAN) 2 % Apply 1 application topically 2 (two) times  daily.  . nitroGLYCERIN (NITROSTAT) 0.4 MG SL tablet Place 1 tablet (0.4 mg total) under the tongue every 5 (five) minutes x 3 doses as needed for chest pain.  . Omega-3 Fatty Acids (FISH OIL) 1000 MG CAPS Take 2,000-3,000 mg See admin instructions by mouth. Take 2000 mg every morning Take 3000 mg at noon and evening.  [name] Kitchen oxyCODONE-acetaminophen (PERCOCET/ROXICET) 5-325 MG per tablet Take 2 tablets by mouth every 6 (six) hours as needed. Take eight tablets daily as needed for pain per family and list. For bad  Back pain  . sertraline (ZOLOFT) 100 MG tablet Take 100 mg by mouth daily.  [name] Kitchen tiotropium (SPIRIVA) 18 MCG inhalation capsule Place 18 mcg into inhaler and inhale at bedtime.   . vitamin B-12 (CYANOCOBALAMIN) 1000 MCG tablet Take 1,000 mcg by mouth daily.  [name] Kitchen amoxicillin-clavulanate (AUGMENTIN) 875-125 MG tablet Take 1 tablet by mouth 2 (two) times daily for 7 days.  . Continuous Blood Gluc Receiver (FREESTYLE LIBRE 14 DAY READER) DEVI 1 each by Does not apply route 4 (four) times daily. (Patient not taking: Reported on 09/15/2018)  . Continuous Blood Gluc Sensor (FREESTYLE LIBRE 14 DAY SENSOR) MISC 1 each by Does not apply route every 14 (fourteen) days. (Patient not taking: Reported on 09/15/2018)  . sulfamethoxazole-trimethoprim (BACTRIM DS) 800-160 MG tablet Take 1 tablet by mouth 2 (two) times daily for 7 days.   No facility-administered encounter medications on file as of 09/15/2018.     Allergies  Allergen Reactions  . Amiodarone     Weakness during  2018 admission  . Atorvastatin     Tremors  . Simvastatin     Muscle weakness    Review of Systems  Constitutional: Negative for chills, fatigue and fever.  Respiratory: Negative for shortness of breath.   Cardiovascular: Positive for leg swelling. Negative for chest pain and palpitations.  Skin: Positive for wound (bilateral feet).  Neurological: Negative for weakness.  Psychiatric/Behavioral: Negative for confusion.  All other systems reviewed and are negative.       Objective:    BP (!) 166/77   Pulse 94   Temp 97.9 F (36.6 C) (Oral)   Ht 6' (1.829 m)   Wt (!) 304 lb (137.9 kg)   BMI 41.23 kg/m    Wt Readings from Last 3 Encounters:  09/15/18 (!) 304 lb (137.9 kg)  07/11/18 (!) 300 lb 3.2 oz (136.2 kg)  07/05/18 298 lb (135.2 kg)    Physical Exam Vitals signs and nursing note reviewed.  Constitutional:      Appearance: Normal appearance. He is morbidly obese.  HENT:     Head: Normocephalic and atraumatic.     Mouth/Throat:     Mouth: Mucous membranes are moist.     Pharynx: Oropharynx is clear.  Eyes:     Conjunctiva/sclera: Conjunctivae normal.     Pupils: Pupils are equal, round, and reactive to light.  Neck:     Musculoskeletal: Neck supple.  Cardiovascular:     Rate and Rhythm: Normal rate and regular rhythm.     Pulses:          Dorsalis pedis pulses are 2+ on the right side and 2+ on the left side.       Posterior tibial pulses are 2+ on the right side and 2+ on the left side.     Heart sounds: Normal heart sounds. No murmur. No friction rub. No gallop.   Pulmonary:     Effort: Pulmonary effort is normal.  Breath sounds: Normal breath sounds.  Musculoskeletal:     Right lower leg: 3+ Edema present.     Left lower leg: 3+ Edema present.  Feet:     Right foot:     Skin integrity: Ulcer, blister, erythema and warmth present.     Left foot:     Skin integrity: Ulcer, blister, erythema and warmth present.     Comments: Wounds as noted in  pictures. Wounds to top of bilateral feet - erythematous with serosanguinous drainage. Wound to left lateral midfoot with central necrosis and surrounding erythema.  Skin:    General: Skin is warm and dry.     Capillary Refill: Capillary refill takes less than 2 seconds.  Neurological:     General: No focal deficit present.     Mental Status: He is alert and oriented to person, place, and time.  Psychiatric:        Mood and Affect: Mood normal.        Behavior: Behavior normal. Behavior is cooperative.        Thought Content: Thought content normal.        Judgment: Judgment normal.          Results for orders placed or performed in visit on 07/11/18  Bayer DCA Hb A1c Waived  Result Value Ref Range   HB A1C (BAYER DCA - WAIVED) 7.7 (H) <7.0 %  TSH  Result Value Ref Range   TSH 2.360 0.450 - 4.500 uIU/mL  Lipid panel  Result Value Ref Range   Cholesterol, Total 267 (H) 100 - 199 mg/dL   Triglycerides 675 (HH) 0 - 149 mg/dL   HDL 29 (L) >39 mg/dL   VLDL Cholesterol Cal Comment 5 - 40 mg/dL   LDL Calculated Comment 0 - 99 mg/dL   Chol/HDL Ratio 9.2 (H) 0.0 - 5.0 ratio       Pertinent labs & imaging results that were available during my care of the patient were reviewed by me and considered in my medical decision making.  Assessment & Plan:  Jose [name] was seen today for sores on both feet.  Diagnoses and all orders for this visit:  Diabetic ulcer of left midfoot associated with diabetes mellitus due to underlying condition, unspecified ulcer stage (HCC) -     amoxicillin-clavulanate (AUGMENTIN) 875-125 MG tablet; Take 1 tablet by mouth 2 (two) times daily for 7 days. -     sulfamethoxazole-trimethoprim (BACTRIM DS) 800-160 MG tablet; Take 1 tablet by mouth 2 (two) times daily for 7 days. -     AMB referral to wound care center -     Ambulatory referral to General Surgery  Type 2 diabetes mellitus with right diabetic foot ulcer (HCC) -     amoxicillin-clavulanate  (AUGMENTIN) 875-125 MG tablet; Take 1 tablet by mouth 2 (two) times daily for 7 days. -     sulfamethoxazole-trimethoprim (BACTRIM DS) 800-160 MG tablet; Take 1 tablet by mouth 2 (two) times daily for 7 days. -     AMB referral to wound care center -     Ambulatory referral to General Surgery  Pt reluctant to go to hospital today. Urgent referral to general surgery and wound care center. If symptoms worsen or fever develops, pt aware to go to ED. Will return at first of week for reevaluation. Wet to dry dressings applied in office. Wound care discussed in detail.   Continue all other maintenance medications.  Follow up plan: Return in about 3 days (around 09/18/2018),  or if symptoms worsen or fail to improve.  Educational handout given for diabetic foot care  The above assessment and management plan was discussed with the patient. The patient verbalized understanding of and has agreed to the management plan. Patient is aware to call the clinic if symptoms persist or worsen. Patient is aware when to return to the clinic for a follow-up visit. Patient educated on when it is appropriate to go to the emergency department.   Monia Pouch, FNP-C [facility] [phone]

## 2018-09-20 ENCOUNTER — Ambulatory Visit (INDEPENDENT_AMBULATORY_CARE_PROVIDER_SITE_OTHER): Payer: Medicare HMO | Admitting: Family Medicine

## 2018-09-20 ENCOUNTER — Encounter: Payer: Self-pay | Admitting: Family Medicine

## 2018-09-20 VITALS — BP 196/89 | HR 98 | Temp 97.0°F | Ht 73.0 in | Wt 298.4 lb

## 2018-09-20 DIAGNOSIS — E11621 Type 2 diabetes mellitus with foot ulcer: Secondary | ICD-10-CM

## 2018-09-20 DIAGNOSIS — L97421 Non-pressure chronic ulcer of left heel and midfoot limited to breakdown of skin: Secondary | ICD-10-CM

## 2018-09-20 DIAGNOSIS — L97429 Non-pressure chronic ulcer of left heel and midfoot with unspecified severity: Secondary | ICD-10-CM

## 2018-09-20 DIAGNOSIS — E08621 Diabetes mellitus due to underlying condition with foot ulcer: Secondary | ICD-10-CM | POA: Diagnosis not present

## 2018-09-20 NOTE — Progress Notes (Signed)
BP (!) 196/89   Pulse 98   Temp (!) 97 F (36.1 C) (Oral)   Ht 6\' 1"  (1.854 m)   Wt 298 lb 6.4 oz (135.4 kg)   SpO2 92%   BMI 39.37 kg/m    Subjective:    Patient ID: Jose [name], male    DOB: March 28, 1951, 68 y.o.   MRN: [id number]  HPI: Jose [name] is a 68 y.o. male presenting on 09/20/2018 for Diabetic Ulcer (bilateral feet - 5 day re check )   HPI Diabetic foot ulcer Patient is coming in for recheck of his diabetic foot ulcer.  He was seen first time 5 days ago in our office for this and was instructed to use meta honey and nonstick dressing on his feet and use compression.  He was using compression and Ace bandages before and thinks that may have caused some the irritation on the top of his feet but the really concern is on the left bottom of his foot ulcer that is developed on the lateral aspect.  The top of his feet he feels like are looking better and based on the pictures that were there previously are looking better but the bottom larger ulcer is not looking better.  He denies any fevers or redness or warmth or significant amount of drainage.  Relevant past medical, surgical, family and social history reviewed and updated as indicated. Interim medical history since our last visit reviewed. Allergies and medications reviewed and updated.  Review of Systems  Constitutional: Negative for chills and fever.  Respiratory: Negative for shortness of breath and wheezing.   Cardiovascular: Negative for chest pain and leg swelling.  Musculoskeletal: Negative for back pain and gait problem.  Skin: Positive for wound. Negative for color change and rash.  All other systems reviewed and are negative.   Per HPI unless specifically indicated above   Allergies as of 09/20/2018      Reactions   Amiodarone    Weakness during 2018 admission   Atorvastatin    Tremors   Simvastatin    Muscle weakness      Medication List       Accurate as of September 20, 2018 10:37 AM. Always  use your most recent med list.        amoxicillin-clavulanate 875-125 MG tablet Commonly known as:  AUGMENTIN Take 1 tablet by mouth 2 (two) times daily for 7 days.   apixaban 5 MG Tabs tablet Commonly known as:  ELIQUIS Take 1 tablet (5 mg total) by mouth 2 (two) times daily.   clopidogrel 75 MG tablet Commonly known as:  PLAVIX Take 1 tablet (75 mg total) by mouth daily.   ergocalciferol 1.25 MG (50000 UT) capsule Commonly known as:  VITAMIN D2 Take 50,000 Units by mouth See admin instructions. Take one twice a week on wednesdays and saturdays   ezetimibe 10 MG tablet Commonly known as:  ZETIA Take 1 tablet (10 mg total) by mouth daily.   ferrous sulfate 325 (65 FE) MG tablet Take 325 mg by mouth 3 (three) times daily with meals.   Fish Oil 1000 MG Caps Take 2,000-3,000 mg See admin instructions by mouth. Take 2000 mg every morning Take 3000 mg at noon and evening.   FREESTYLE LIBRE 14 DAY READER Devi 1 each by Does not apply route 4 (four) times daily.   FREESTYLE LIBRE 14 DAY SENSOR Misc 1 each by Does not apply route every 14 (fourteen) days.   furosemide 40  MG tablet Commonly known as:  LASIX Take 40 mg by mouth 2 (two) times daily.   glipiZIDE 5 MG tablet Commonly known as:  GLUCOTROL Take 5 mg by mouth 2 (two) times daily.   ICAPS AREDS 2 Caps Take 1 capsule by mouth 2 (two) times daily.   insulin aspart 100 UNIT/ML injection Commonly known as:  novoLOG Inject 30 Units into the skin 3 (three) times daily before meals.   insulin glargine 100 UNIT/ML injection Commonly known as:  LANTUS Inject 0.3-0.6 mLs (30-60 Units total) into the skin See admin instructions. 50 units in the AM and 60 units in the PM   isosorbide mononitrate 30 MG 24 hr tablet Commonly known as:  IMDUR Take 0.5 tablets (15 mg total) by mouth daily.   levothyroxine 50 MCG tablet Commonly known as:  SYNTHROID, LEVOTHROID TAKE 1 TABLET BY MOUTH ONCE DAILY BEFORE BREAKFAST     magnesium oxide 400 MG tablet Commonly known as:  MAG-OX Take 1 tablet (400 mg total) by mouth daily.   metFORMIN 1000 MG tablet Commonly known as:  GLUCOPHAGE Take 1 tablet (1,000 mg total) by mouth 2 (two) times daily with a meal.   metoprolol succinate 25 MG 24 hr tablet Commonly known as:  TOPROL-XL Take 0.5 tablets (12.5 mg total) by mouth daily.   mupirocin cream 2 % Commonly known as:  BACTROBAN Apply 1 application topically 2 (two) times daily.   nitroGLYCERIN 0.4 MG SL tablet Commonly known as:  NITROSTAT Place 1 tablet (0.4 mg total) under the tongue every 5 (five) minutes x 3 doses as needed for chest pain.   oxyCODONE-acetaminophen 5-325 MG tablet Commonly known as:  PERCOCET/ROXICET Take 2 tablets by mouth every 6 (six) hours as needed. Take eight tablets daily as needed for pain per family and list. For bad  Back pain   sertraline 100 MG tablet Commonly known as:  ZOLOFT Take 100 mg by mouth daily.   sulfamethoxazole-trimethoprim 800-160 MG tablet Commonly known as:  BACTRIM DS Take 1 tablet by mouth 2 (two) times daily for 7 days.   tiotropium 18 MCG inhalation capsule Commonly known as:  SPIRIVA Place 18 mcg into inhaler and inhale at bedtime.   vitamin B-12 1000 MCG tablet Commonly known as:  CYANOCOBALAMIN Take 1,000 mcg by mouth daily.          Objective:    BP (!) 196/89   Pulse 98   Temp (!) 97 F (36.1 C) (Oral)   Ht 6\' 1"  (1.854 m)   Wt 298 lb 6.4 oz (135.4 kg)   SpO2 92%   BMI 39.37 kg/m   Wt Readings from Last 3 Encounters:  09/20/18 298 lb 6.4 oz (135.4 kg)  09/15/18 (!) 304 lb (137.9 kg)  07/11/18 (!) 300 lb 3.2 oz (136.2 kg)    Physical Exam Vitals signs and nursing note reviewed.  Constitutional:      General: He is not in acute distress.    Appearance: He is well-developed. He is not diaphoretic.  Neck:     Thyroid: No thyromegaly.  Musculoskeletal:        General: Swelling (3+ pitting edema in bilateral feet)  present.  Skin:    General: Skin is warm and dry.     Findings: Wound (Superficial stage I ulcer with some blistering on the top of both of his feet in a linear pattern, likely from the Ace bandages he was using) present. No rash.  Neurological:     Mental Status: He is alert and oriented to person, place, and time.     Coordination: Coordination normal.  Psychiatric:        Behavior: Behavior normal.       Assessment & Plan:   Problem List Items Addressed This Visit    None    Visit Diagnoses    Diabetic ulcer of left midfoot associated with diabetes mellitus due to underlying condition, unspecified ulcer stage ([place])    -  Primary   Diabetic ulcer of left midfoot associated with type 2 diabetes mellitus, limited to breakdown of skin ([place])        We were able to call wound care and get an appointment for him for wound care this coming Tuesday in 6 days from now, he will continue with the meta honey and wraps and do compression stockings as well and will likely need Santyl to help with this which is helped previously.  He also has an appointment with general surgery tomorrow with Dr. Blake Divine at 2:45 PM  Follow up plan: Return if symptoms worsen or fail to improve, for Patient will see surgery and wound care and come back as needed.  Counseling provided for all of the vaccine components No orders of the defined types were placed in this encounter.  Caryl Pina, MD [facility] 09/20/2018, 10:37 AM

## 2018-09-21 ENCOUNTER — Encounter: Payer: Self-pay | Admitting: General Surgery

## 2018-09-21 ENCOUNTER — Ambulatory Visit: Payer: Medicare HMO | Admitting: General Surgery

## 2018-09-21 VITALS — BP 126/59 | HR 91 | Temp 97.5°F | Resp 18 | Wt 298.4 lb

## 2018-09-21 DIAGNOSIS — I739 Peripheral vascular disease, unspecified: Secondary | ICD-10-CM

## 2018-09-21 DIAGNOSIS — E11621 Type 2 diabetes mellitus with foot ulcer: Secondary | ICD-10-CM

## 2018-09-21 DIAGNOSIS — L97529 Non-pressure chronic ulcer of other part of left foot with unspecified severity: Secondary | ICD-10-CM

## 2018-09-21 DIAGNOSIS — I872 Venous insufficiency (chronic) (peripheral): Secondary | ICD-10-CM

## 2018-09-21 NOTE — Progress Notes (Signed)
[facility] History and Physical  Reason for Referral: Diabetic Foot Ulcer  Referring Physician:  Dr. Garen Lah     Jose [name] is a 68 y.o. male.  HPI: Jose [name] is a 68 yo who has a history of CAD s/p stent placement in 2015, CHF, COPD, TIA, DM with neuropathy and diabetic foot ulcer that his PCP has seen and wanted to be referred to surgery for further evaluation. He reports that he had been wrapping his feet and noticed new abrasion on the tops of both feet after wrapping with an ACE bandage. He has also had a diabetic foot ulcer on the left side of his foot for over 2 weeks now and has been seen by his PCP and given some antibiotics.  He was also told to do a wet to dry dressing and was given follow up with [place] and the wound care center.  He denies any drainage from the area and says that it started out like a blister.  He says that his wife looks at his feet everyday, and that he had never had issues with ulcers in the past.    He reports chronic leg swelling and tries to wear compression socks or wrap his feet as much as possible. Since he noted the abrasions on the dorsum of the feet they have not wrapped with an ACE.  [name] Kitchen  Past Medical History:  Diagnosis Date  . Anemia   . Anxiety   . CAD (coronary artery disease)    a. prior silent inferior MI, NSTEMI 07/2014 with chronically occluded RCA s/p unsuccessful PCI. b. STEMI 07/2017 with multivessel disease s/p failed PTCA of distal LAD.  [name] Kitchen Carotid artery disease ([place])   . Cataract   . Chronic combined systolic and diastolic CHF (congestive heart failure) ([place])   . Chronic respiratory failure ([place])    a. Placed on home O2 01/2016.  [name] Kitchen CKD (chronic kidney disease), stage III ([place])   . COPD (chronic obstructive pulmonary disease) ([place])   . Diabetes mellitus ([place])   . Former tobacco use   . Hyperlipidemia   . Hypertension   . Hyponatremia   . Morbid obesity ([place])   . Neuropathy   . PAF (paroxysmal atrial  fibrillation) ([place])    a. dx 06/2017.  [name] Kitchen Sleep apnea   . Stroke ([place]) 06/2017  . TIA (transient ischemic attack) 2017   a. hemispheric L carotid artery syndrome in setting of hypotension.    Past Surgical History:  Procedure Laterality Date  . BACK SURGERY    . CORONARY BALLOON ANGIOPLASTY N/A 07/14/2017   Procedure: CORONARY BALLOON ANGIOPLASTY;  Surgeon: Martinique, Peter M, MD;  Location: [facility];  Service: Cardiovascular;  Laterality: N/A;  . EYE SURGERY    . INTRAVASCULAR ULTRASOUND/IVUS N/A 07/14/2017   Procedure: Intravascular Ultrasound/IVUS;  Surgeon: Martinique, Peter M, MD;  Location: [facility];  Service: Cardiovascular;  Laterality: N/A;  . LEFT HEART CATH AND CORONARY ANGIOGRAPHY N/A 07/14/2017   Procedure: LEFT HEART CATH AND CORONARY ANGIOGRAPHY;  Surgeon: Martinique, Peter M, MD;  Location: [facility];  Service: Cardiovascular;  Laterality: N/A;  . LEFT HEART CATHETERIZATION WITH CORONARY ANGIOGRAM N/A 08/28/2014   Procedure: LEFT HEART CATHETERIZATION WITH CORONARY ANGIOGRAM;  Surgeon: Clent Demark, MD;  Location: [facility];  Service: Cardiovascular;  Laterality: N/A;  . PERIPHERAL VASCULAR CATHETERIZATION Bilateral 03/22/2016   Procedure: Carotid Angiography;  Surgeon: Elam Dutch, MD;  Location: [facility];  Service: Cardiovascular;  Laterality: Bilateral;    Family History  Problem Relation Age of Onset  . Hypertension Father   . Heart disease Father   . Cancer Mother        uterine  . Diabetes Mother   . Diabetes Brother   . Cancer Brother        lymphoma  . Diabetes Brother     Social History   Tobacco Use  . Smoking status: Former Smoker    Packs/day: 3.00    Years: 45.00    Pack years: 135.00    Types: Cigarettes    Last attempt to quit: 08/31/2007    Years since quitting: 11.0  . Smokeless tobacco: Never Used  Substance Use Topics  . Alcohol use: No  . Drug use: No    Medications: I have reviewed the  patient's current medications. Allergies as of 09/21/2018      Reactions   Amiodarone    Weakness during 2018 admission   Atorvastatin    Tremors   Simvastatin    Muscle weakness      Medication List       Accurate as of September 21, 2018  3:31 PM. Always use your most recent med list.        amoxicillin-clavulanate 875-125 MG tablet Commonly known as:  AUGMENTIN Take 1 tablet by mouth 2 (two) times daily for 7 days.   apixaban 5 MG Tabs tablet Commonly known as:  ELIQUIS Take 1 tablet (5 mg total) by mouth 2 (two) times daily.   clopidogrel 75 MG tablet Commonly known as:  PLAVIX Take 1 tablet (75 mg total) by mouth daily.   ergocalciferol 1.25 MG (50000 UT) capsule Commonly known as:  VITAMIN D2 Take 50,000 Units by mouth See admin instructions. Take one twice a week on wednesdays and saturdays   ezetimibe 10 MG tablet Commonly known as:  ZETIA Take 1 tablet (10 mg total) by mouth daily.   ferrous sulfate 325 (65 FE) MG tablet Take 325 mg by mouth 3 (three) times daily with meals.   Fish Oil 1000 MG Caps Take 2,000-3,000 mg See admin instructions by mouth. Take 2000 mg every morning Take 3000 mg at noon and evening.   FREESTYLE LIBRE 14 DAY READER Devi 1 each by Does not apply route 4 (four) times daily.   FREESTYLE LIBRE 14 DAY SENSOR Misc 1 each by Does not apply route every 14 (fourteen) days.   furosemide 40 MG tablet Commonly known as:  LASIX Take 40 mg by mouth 2 (two) times daily.   glipiZIDE 5 MG tablet Commonly known as:  GLUCOTROL Take 5 mg by mouth 2 (two) times daily.   ICAPS AREDS 2 Caps Take 1 capsule by mouth 2 (two) times daily.   insulin aspart 100 UNIT/ML injection Commonly known as:  novoLOG Inject 30 Units into the skin 3 (three) times daily before meals.   insulin glargine 100 UNIT/ML injection Commonly known as:  LANTUS Inject 0.3-0.6 mLs (30-60 Units total) into the skin See admin instructions. 50 units in the AM and 60 units  in the PM   isosorbide mononitrate 30 MG 24 hr tablet Commonly known as:  IMDUR Take 0.5 tablets (15 mg total) by mouth daily.   levothyroxine 50 MCG tablet Commonly known as:  SYNTHROID, LEVOTHROID TAKE 1 TABLET BY MOUTH ONCE DAILY BEFORE BREAKFAST   magnesium oxide 400 MG tablet Commonly known as:  MAG-OX Take 1 tablet (400 mg total)  by mouth daily.   metFORMIN 1000 MG tablet Commonly known as:  GLUCOPHAGE Take 1 tablet (1,000 mg total) by mouth 2 (two) times daily with a meal.   metoprolol succinate 25 MG 24 hr tablet Commonly known as:  TOPROL-XL Take 0.5 tablets (12.5 mg total) by mouth daily.   nitroGLYCERIN 0.4 MG SL tablet Commonly known as:  NITROSTAT Place 1 tablet (0.4 mg total) under the tongue every 5 (five) minutes x 3 doses as needed for chest pain.   oxyCODONE-acetaminophen 5-325 MG tablet Commonly known as:  PERCOCET/ROXICET Take 2 tablets by mouth every 6 (six) hours as needed. Take eight tablets daily as needed for pain per family and list. For bad  Back pain   sertraline 100 MG tablet Commonly known as:  ZOLOFT Take 100 mg by mouth daily.   sulfamethoxazole-trimethoprim 800-160 MG tablet Commonly known as:  BACTRIM DS Take 1 tablet by mouth 2 (two) times daily for 7 days.   tiotropium 18 MCG inhalation capsule Commonly known as:  SPIRIVA Place 18 mcg into inhaler and inhale at bedtime.   vitamin B-12 1000 MCG tablet Commonly known as:  CYANOCOBALAMIN Take 1,000 mcg by mouth daily.        ROS:  A comprehensive review of systems was negative except for: Cardiovascular: positive for HTN Gastrointestinal: positive for reflux symptoms Musculoskeletal: positive for back pain Neurological: positive for Numbness, neuropathy feet  Blood pressure (!) 126/59, pulse 91, temperature (!) 97.5 F (36.4 C), temperature source Temporal, resp. rate 18, weight 298 lb 6.4 oz (135.4 kg). Physical Exam Vitals signs reviewed.  Constitutional:      Appearance:  Normal appearance.  HENT:     Head: Normocephalic.     Nose: Nose normal.     Mouth/Throat:     Mouth: Mucous membranes are moist.  Eyes:     Pupils: Pupils are equal, round, and reactive to light.  Neck:     Musculoskeletal: Normal range of motion.  Cardiovascular:     Rate and Rhythm: Normal rate and regular rhythm.     Pulses:          Dorsalis pedis pulses are 2+ on the right side and detected w/ Doppler on the left side.     Comments: Biphasic DP signal on the left, palpable DP on the right; unable to palpate the PT on left or right  Pulmonary:     Effort: Pulmonary effort is normal.     Breath sounds: Normal breath sounds.  Abdominal:     General: There is no distension.     Palpations: Abdomen is soft.     Tenderness: There is no abdominal tenderness.  Musculoskeletal: Normal range of motion.     Right lower leg: 3+ Pitting Edema present.     Left lower leg: 3+ Pitting Edema present.  Skin:    General: Skin is warm.     Comments: Linear abrasions across the dorsum of bilateral feet, no erythema or drainage, superficial, and left lateral foot with drain ulceration with eschar, no drainage, no extending erythema; toe nails with fungal changes, no ulcers noted between the toes  Neurological:     General: No focal deficit present.     Mental Status: He is alert and oriented to person, place, and time.  Psychiatric:        Mood and Affect: Mood normal.        Behavior: Behavior normal.        Thought Content: Thought content  normal.        Judgment: Judgment normal.           Assessment & Plan:  ANTWIONE PICOTTE is a 68 y.o. male with diabetic foot ulceration on the left foot and linear abrasions bilaterally from ACE wraps. He has significant venous insufficiency with pitting edema and loss of hair on his shins, and a differential on his arterial exam with the left foot only have a biphasic signal as I was unable to palpate anything on the left. The right has a palpable  pulse.  Given that his ulceration is on the left lateral foot, he needs to be evaluated by Vascular to further assess if he needs any revascularization to heal this wound.  He has a history of Carotid stenosis that is followed by Vascular, and has had CAD with stents so it is likely that he has big vessel disease on top of his diabetic smaller vessel disease.  Explained to the patient and wife the venous insufficiency as well as concern for arterial disease given the pulse exam.   -Continue elevation of extremities, compression with socks preferred over ACE wraps given the issues with the abrasions noted above  -Wound Care center follow up, the patient has Santyl at home, and can put this on the left lateral foot ulceration with the damp to dry dressing as he has been doing until seeing wound care. No surgical debridement done at this time.  -Needs Vascular Referral for the pulse differential as he likely has some degree of stenotic disease that could hopefully be revascularized, Referring to Dr. Donnetta Hutching so patient can be seen in [place], [place]   All questions were answered to the satisfaction of the patient and family.   Virl Cagey 09/21/2018, 3:31 PM

## 2018-09-27 ENCOUNTER — Encounter: Payer: Self-pay | Admitting: General Surgery

## 2018-09-27 DIAGNOSIS — I872 Venous insufficiency (chronic) (peripheral): Secondary | ICD-10-CM | POA: Insufficient documentation

## 2018-09-27 DIAGNOSIS — E11621 Type 2 diabetes mellitus with foot ulcer: Secondary | ICD-10-CM | POA: Insufficient documentation

## 2018-09-27 DIAGNOSIS — L97529 Non-pressure chronic ulcer of other part of left foot with unspecified severity: Secondary | ICD-10-CM

## 2018-09-27 DIAGNOSIS — I739 Peripheral vascular disease, unspecified: Secondary | ICD-10-CM | POA: Insufficient documentation

## 2018-09-27 NOTE — Patient Instructions (Signed)
Continue wrapping and elevation. Santyl to left foot wound with damp to dry dressing. [place] follow up.

## 2018-10-10 ENCOUNTER — Telehealth: Payer: Self-pay | Admitting: Family Medicine

## 2018-10-10 DIAGNOSIS — L97518 Non-pressure chronic ulcer of other part of right foot with other specified severity: Secondary | ICD-10-CM | POA: Diagnosis not present

## 2018-10-10 DIAGNOSIS — E1122 Type 2 diabetes mellitus with diabetic chronic kidney disease: Secondary | ICD-10-CM | POA: Diagnosis not present

## 2018-10-10 DIAGNOSIS — I4891 Unspecified atrial fibrillation: Secondary | ICD-10-CM | POA: Diagnosis not present

## 2018-10-10 DIAGNOSIS — L97519 Non-pressure chronic ulcer of other part of right foot with unspecified severity: Secondary | ICD-10-CM | POA: Diagnosis not present

## 2018-10-10 DIAGNOSIS — N189 Chronic kidney disease, unspecified: Secondary | ICD-10-CM | POA: Diagnosis not present

## 2018-10-10 DIAGNOSIS — E1140 Type 2 diabetes mellitus with diabetic neuropathy, unspecified: Secondary | ICD-10-CM | POA: Diagnosis not present

## 2018-10-10 DIAGNOSIS — L97528 Non-pressure chronic ulcer of other part of left foot with other specified severity: Secondary | ICD-10-CM | POA: Diagnosis not present

## 2018-10-10 DIAGNOSIS — J449 Chronic obstructive pulmonary disease, unspecified: Secondary | ICD-10-CM | POA: Diagnosis not present

## 2018-10-10 DIAGNOSIS — E11621 Type 2 diabetes mellitus with foot ulcer: Secondary | ICD-10-CM | POA: Diagnosis not present

## 2018-10-10 DIAGNOSIS — I130 Hypertensive heart and chronic kidney disease with heart failure and stage 1 through stage 4 chronic kidney disease, or unspecified chronic kidney disease: Secondary | ICD-10-CM | POA: Diagnosis not present

## 2018-10-10 DIAGNOSIS — I509 Heart failure, unspecified: Secondary | ICD-10-CM | POA: Diagnosis not present

## 2018-10-10 DIAGNOSIS — L97529 Non-pressure chronic ulcer of other part of left foot with unspecified severity: Secondary | ICD-10-CM | POA: Diagnosis not present

## 2018-10-10 NOTE — Telephone Encounter (Signed)
Covering PCP- please advise  

## 2018-10-10 NOTE — Telephone Encounter (Signed)
Dr. Warrick Parisian will be back tomorrow. Will forward to him

## 2018-10-11 NOTE — Telephone Encounter (Signed)
Wound center aware

## 2018-10-11 NOTE — Telephone Encounter (Signed)
Go ahead and stop both the Plavix and Eliquis 5 days before and started up the day after.

## 2018-10-12 DIAGNOSIS — E11621 Type 2 diabetes mellitus with foot ulcer: Secondary | ICD-10-CM | POA: Diagnosis not present

## 2018-10-18 ENCOUNTER — Encounter (HOSPITAL_BASED_OUTPATIENT_CLINIC_OR_DEPARTMENT_OTHER): Payer: Medicare HMO | Attending: Internal Medicine

## 2018-11-16 ENCOUNTER — Telehealth: Payer: Self-pay | Admitting: Family Medicine

## 2018-11-17 ENCOUNTER — Telehealth (HOSPITAL_COMMUNITY): Payer: Self-pay | Admitting: Rehabilitation

## 2018-11-17 ENCOUNTER — Other Ambulatory Visit: Payer: Self-pay

## 2018-11-17 DIAGNOSIS — I739 Peripheral vascular disease, unspecified: Secondary | ICD-10-CM

## 2018-11-17 DIAGNOSIS — I6523 Occlusion and stenosis of bilateral carotid arteries: Secondary | ICD-10-CM

## 2018-11-17 IMAGING — MR MR HEAD W/O CM
3 series · 48 of 48 positions shown · non-contrast
Comparison: 03/17/2016 MRI of the head.

CLINICAL DATA: 66 y/o M; Focal neuro deficit, > 6 hrs, stroke
suspected.

EXAM:
MRI HEAD WITHOUT CONTRAST
TECHNIQUE: Axial DWI and axial T2 FLAIR propeller sequences were acquired.
Patient was unable to continue the examination and additional
sequences were not acquired.

[Series 3: DWI · axial · 3.0mm · 0.94mm/px · z∈[-16,+124]mm · 27 of 98 slices shown]
[im 1/98]
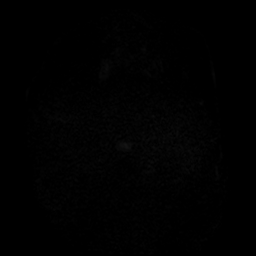
[im 4/98]
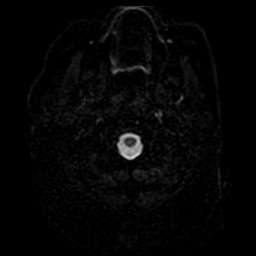
[im 8/98]
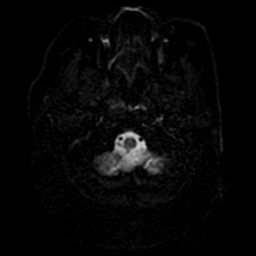
[im 12/98]
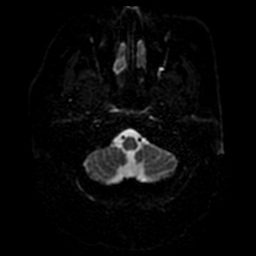
[im 15/98]
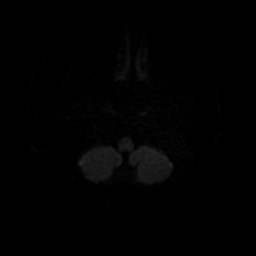
[im 19/98]
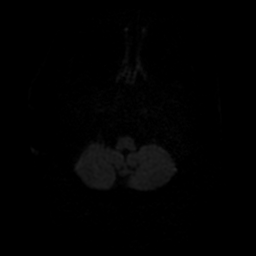
[im 23/98]
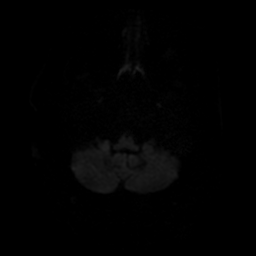
[im 27/98]
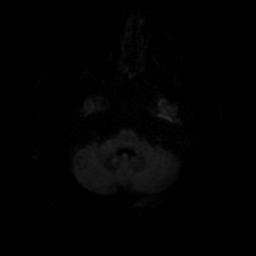
[im 30/98]
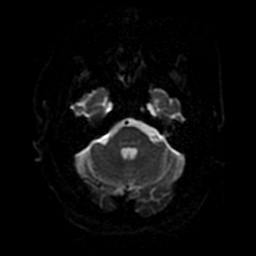
[im 34/98]
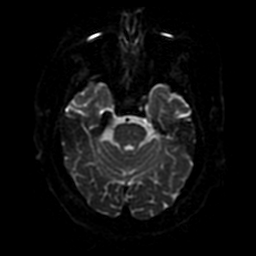
[im 38/98]
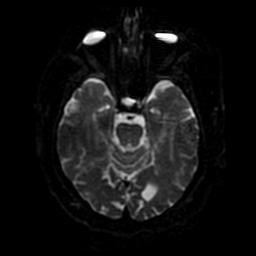
[im 42/98]
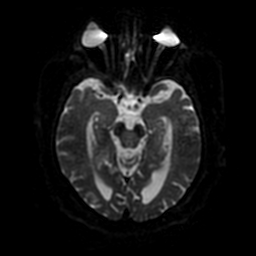
[im 45/98]
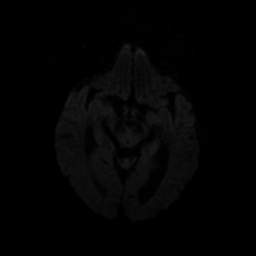
[im 49/98]
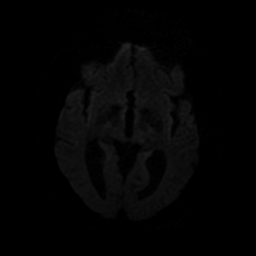
[im 53/98]
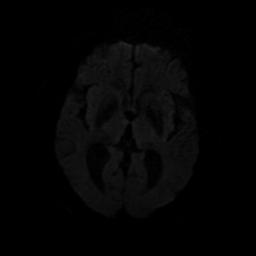
[im 56/98]
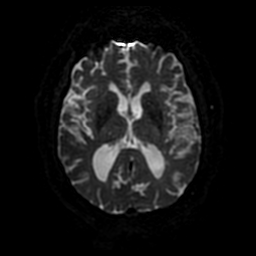
[im 60/98]
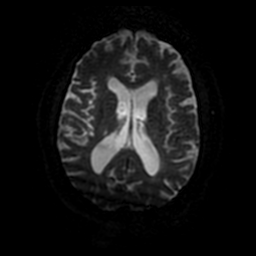
[im 64/98]
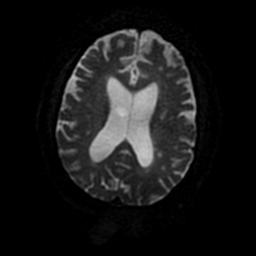
[im 68/98]
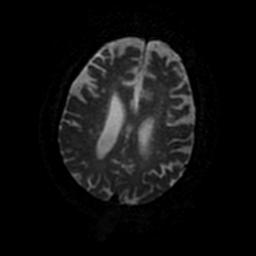
[im 71/98]
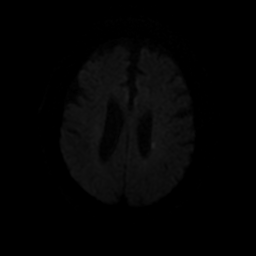
[im 75/98]
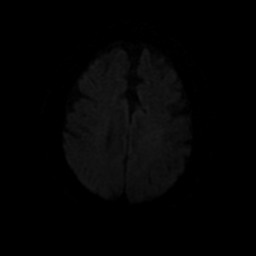
[im 79/98]
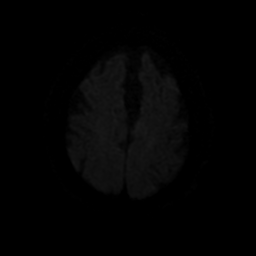
[im 83/98]
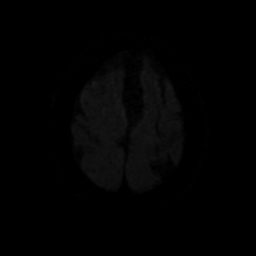
[im 86/98]
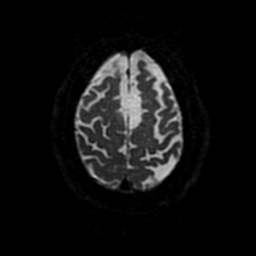
[im 90/98]
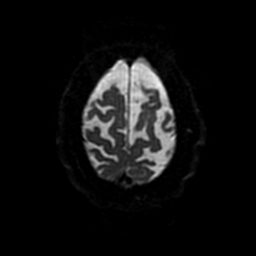
[im 94/98]
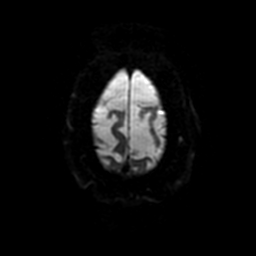
[im 98/98]
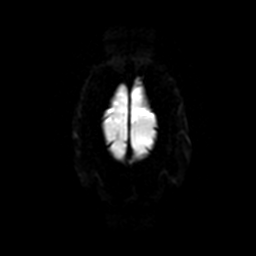

[Series 5: FLAIR · axial · 3.0mm · 0.94mm/px · z∈[-16,+124]mm · 7 of 25 slices shown]
[im 1/25]
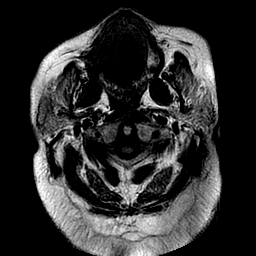
[im 5/25]
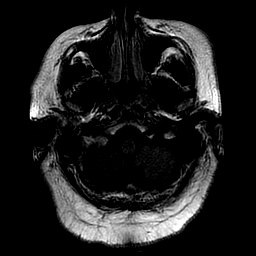
[im 9/25]
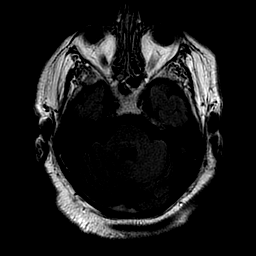
[im 13/25]
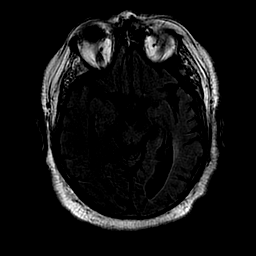
[im 17/25]
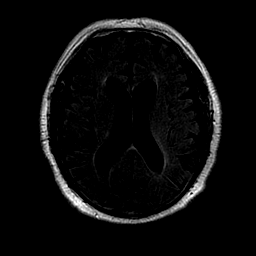
[im 21/25]
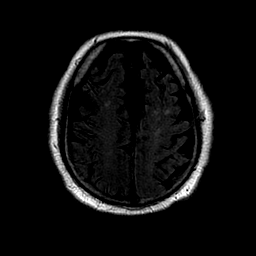
[im 25/25]
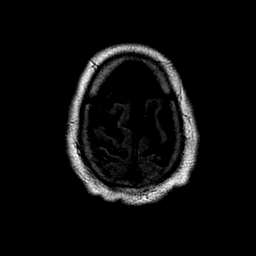

[Series 350: ADC · axial · 3.0mm · 0.94mm/px · z∈[-16,+124]mm · 14 of 49 slices shown]
[im 1/49]
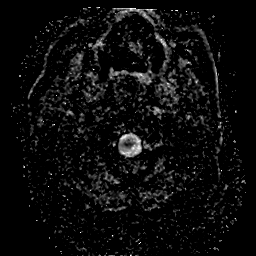
[im 4/49]
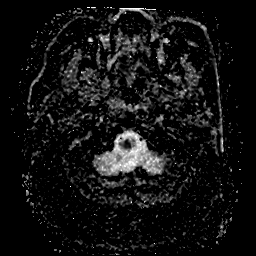
[im 8/49]
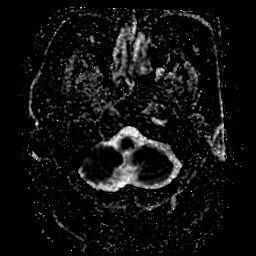
[im 12/49]
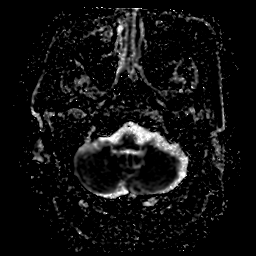
[im 15/49]
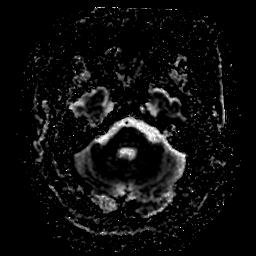
[im 19/49]
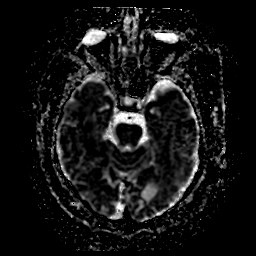
[im 23/49]
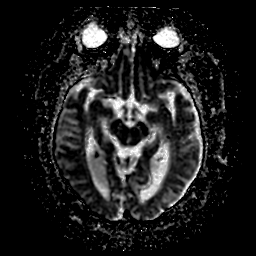
[im 26/49]
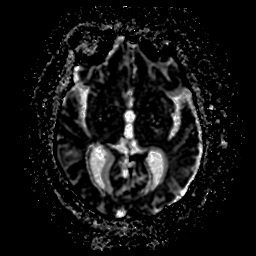
[im 30/49]
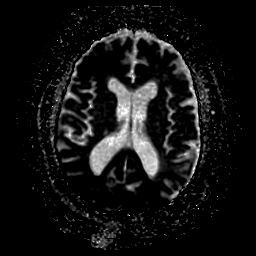
[im 34/49]
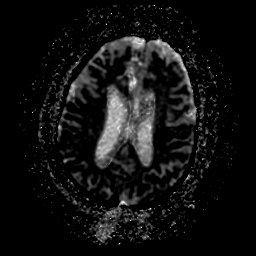
[im 37/49]
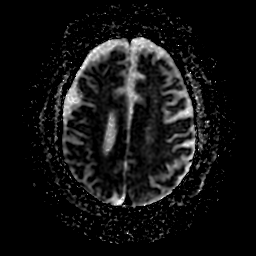
[im 41/49]
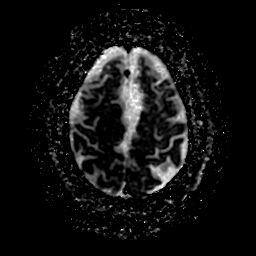
[im 45/49]
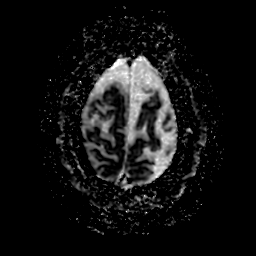
[im 49/49]
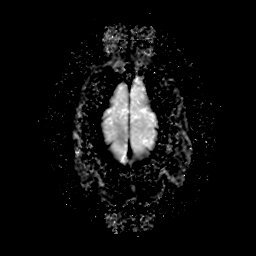

[48 of 48 positions shown; findings below may reference images not displayed]

FINDINGS: Subcentimeter focus of reduced diffusion in left parietal
periventricular white matter (series 3, image 36). No mass effect or
gross hemorrhage. Stable chronic microvascular ischemic changes and
parenchymal volume loss of the brain. No abnormal extra-axial FLAIR
signal to suggest hemorrhage. No herniation or effacement of basilar
cisterns.

Small right maxillary sinus mucous retention cyst.
IMPRESSION: 1. Diffusion and axial T2 FLAIR sequences were acquired.
2. Subcentimeter acute infarction in left parietal periventricular
white matter. No mass effect or gross hemorrhage.
3. Stable chronic microvascular ischemic changes and parenchymal
volume loss of the brain.
These results will be called to the ordering clinician or
representative by the Radiologist Assistant, and communication
documented in the PACS or zVision Dashboard.

By: Sheka Malo M.D.

## 2018-11-20 ENCOUNTER — Ambulatory Visit: Payer: Medicare HMO | Admitting: Surgery

## 2018-11-20 ENCOUNTER — Ambulatory Visit (HOSPITAL_COMMUNITY): Admission: RE | Admit: 2018-11-20 | Payer: Medicare HMO | Source: Ambulatory Visit

## 2018-11-23 NOTE — Telephone Encounter (Signed)
lmtcb to find out who they may have had O2 with previously, will need to do testing though

## 2018-12-01 ENCOUNTER — Encounter: Payer: Self-pay | Admitting: Family Medicine

## 2018-12-01 ENCOUNTER — Ambulatory Visit (INDEPENDENT_AMBULATORY_CARE_PROVIDER_SITE_OTHER): Payer: Medicare HMO | Admitting: Family Medicine

## 2018-12-01 ENCOUNTER — Other Ambulatory Visit: Payer: Self-pay

## 2018-12-01 DIAGNOSIS — K219 Gastro-esophageal reflux disease without esophagitis: Secondary | ICD-10-CM | POA: Diagnosis not present

## 2018-12-01 DIAGNOSIS — K580 Irritable bowel syndrome with diarrhea: Secondary | ICD-10-CM | POA: Diagnosis not present

## 2018-12-01 MED ORDER — FAMOTIDINE 20 MG PO TABS: 20.0000 mg | ORAL_TABLET | Freq: Two times a day (BID) | ORAL | 2 refills | Status: DC

## 2018-12-01 MED ORDER — LINACLOTIDE 145 MCG PO CAPS: 145.0000 ug | ORAL_CAPSULE | Freq: Every day | ORAL | 2 refills | Status: DC

## 2018-12-01 NOTE — Progress Notes (Signed)
Virtual Visit via telephone Note  I connected with Jose [name] on 12/01/18 at 1428 by telephone and verified that I am speaking with the correct person using two identifiers. Jose [name] is currently located at home and wife are currently with her during visit. The provider, Fransisca Kaufmann Junice Fei, MD is located in their office at time of visit.  Call ended at 1438  I discussed the limitations, risks, security and privacy concerns of performing an evaluation and management service by telephone and the availability of in person appointments. I also discussed with the patient that there may be a patient responsible charge related to this service. The patient expressed understanding and agreed to proceed.   History and Present Illness: He has been a lot of indigestion and diarrhea that has been going on for 7 days.  He gets the burping and belching and has been taking prilosec.  He has been going to bed earlier and resting more.  He has diarrhea when he gets the indigestion in the evening.  He complains of nausea and feeling in the afternoon more. He takes 20mg  of prilosec twice daily.  She feels like the Prilosec is actually done worse and is starting to irritate his abdomen causing him more indigestion and diarrhea.  She denies him having any fevers or chills or vomiting.  She denies him having any rectal bleeding.  She says is the same thing that is been going on for past few months but is just been worse over the past 7 days and she just feels like the Prilosec is not helping.  She would like to go back to try using his old Gaviscon and see if it helps better.  She would also like to know if there is something stronger than the Imodium she has been using to help with his diarrhea.  No diagnosis found.  Outpatient Encounter Medications as of 12/01/2018  Medication Sig  . apixaban (ELIQUIS) 5 MG TABS tablet Take 1 tablet (5 mg total) by mouth 2 (two) times daily.  . clopidogrel (PLAVIX) 75 MG  tablet Take 1 tablet (75 mg total) by mouth daily.  . Continuous Blood Gluc Receiver (FREESTYLE LIBRE 14 DAY READER) DEVI 1 each by Does not apply route 4 (four) times daily.  . Continuous Blood Gluc Sensor (FREESTYLE LIBRE 14 DAY SENSOR) MISC 1 each by Does not apply route every 14 (fourteen) days.  . ergocalciferol (VITAMIN D2) 50000 UNITS capsule Take 50,000 Units by mouth See admin instructions. Take one twice a week on wednesdays and saturdays  . ezetimibe (ZETIA) 10 MG tablet Take 1 tablet (10 mg total) by mouth daily.  . ferrous sulfate 325 (65 FE) MG tablet Take 325 mg by mouth 3 (three) times daily with meals.  . furosemide (LASIX) 40 MG tablet Take 40 mg by mouth 2 (two) times daily.  [name] Kitchen glipiZIDE (GLUCOTROL) 5 MG tablet Take 5 mg by mouth 2 (two) times daily.   . insulin aspart (NOVOLOG) 100 UNIT/ML injection Inject 30 Units into the skin 3 (three) times daily before meals.   . insulin glargine (LANTUS) 100 UNIT/ML injection Inject 0.3-0.6 mLs (30-60 Units total) into the skin See admin instructions. 50 units in the AM and 60 units in the PM (Patient taking differently: Inject 50-60 Units into the skin See admin instructions. 50 units in the AM and 60 units in the PM)  . isosorbide mononitrate (IMDUR) 30 MG 24 hr tablet Take 0.5 tablets (15 mg total) by  mouth daily.  [name] Kitchen levothyroxine (SYNTHROID, LEVOTHROID) 50 MCG tablet TAKE 1 TABLET BY MOUTH ONCE DAILY BEFORE BREAKFAST  . magnesium oxide (MAG-OX) 400 MG tablet Take 1 tablet (400 mg total) by mouth daily.  . metFORMIN (GLUCOPHAGE) 1000 MG tablet Take 1 tablet (1,000 mg total) by mouth 2 (two) times daily with a meal.  . metoprolol succinate (TOPROL-XL) 25 MG 24 hr tablet Take 0.5 tablets (12.5 mg total) by mouth daily.  . Multiple Vitamins-Minerals (ICAPS AREDS 2) CAPS Take 1 capsule by mouth 2 (two) times daily.  . nitroGLYCERIN (NITROSTAT) 0.4 MG SL tablet Place 1 tablet (0.4 mg total) under the tongue every 5 (five) minutes x 3 doses as  needed for chest pain.  . Omega-3 Fatty Acids (FISH OIL) 1000 MG CAPS Take 2,000-3,000 mg See admin instructions by mouth. Take 2000 mg every morning Take 3000 mg at noon and evening.  [name] Kitchen oxyCODONE-acetaminophen (PERCOCET/ROXICET) 5-325 MG per tablet Take 2 tablets by mouth every 6 (six) hours as needed. Take eight tablets daily as needed for pain per family and list. For bad  Back pain  . sertraline (ZOLOFT) 100 MG tablet Take 100 mg by mouth daily.  [name] Kitchen tiotropium (SPIRIVA) 18 MCG inhalation capsule Place 18 mcg into inhaler and inhale at bedtime.   . vitamin B-12 (CYANOCOBALAMIN) 1000 MCG tablet Take 1,000 mcg by mouth daily.   No facility-administered encounter medications on file as of 12/01/2018.     Review of Systems  Constitutional: Negative for chills and fever.  Respiratory: Negative for shortness of breath and wheezing.   Cardiovascular: Negative for chest pain and leg swelling.  Gastrointestinal: Positive for abdominal distention, diarrhea and nausea. Negative for abdominal pain, blood in stool, constipation, rectal pain and vomiting.  Genitourinary: Negative for dysuria, flank pain and hematuria.  Musculoskeletal: Negative for back pain and gait problem.  Skin: Negative for rash.  All other systems reviewed and are negative.   Observations/Objective: Patient's wife was given most of the answers for the conversation because she says he is very hard of hearing.  Assessment and Plan: Problem List Items Addressed This Visit    None    Visit Diagnoses    Gastroesophageal reflux disease without esophagitis    -  Primary   Relevant Medications   linaclotide (LINZESS) 145 MCG CAPS capsule   famotidine (PEPCID) 20 MG tablet   Irritable bowel syndrome with diarrhea       Relevant Medications   linaclotide (LINZESS) 145 MCG CAPS capsule   famotidine (PEPCID) 20 MG tablet       Follow Up Instructions:  Recommended to try Linzess and Pepcid, may also go back to trying his old  Gaviscon, if it continues to be an issue in the future we may have to go to gastroenterology but for now because of the coronavirus I recommended that they stay at home instead of doing that referral at this point in time.   I discussed the assessment and treatment plan with the patient. The patient was provided an opportunity to ask questions and all were answered. The patient agreed with the plan and demonstrated an understanding of the instructions.   The patient was advised to call back or seek an in-person evaluation if the symptoms worsen or if the condition fails to improve as anticipated.  The above assessment and management plan was discussed with the patient. The patient verbalized understanding of and has agreed to the management plan. Patient is aware to call the clinic if  symptoms persist or worsen. Patient is aware when to return to the clinic for a follow-up visit. Patient educated on when it is appropriate to go to the emergency department.    I provided 10 minutes of non-face-to-face time during this encounter.    Worthy Rancher, MD

## 2018-12-13 ENCOUNTER — Encounter: Payer: Self-pay | Admitting: Family

## 2018-12-13 ENCOUNTER — Ambulatory Visit (INDEPENDENT_AMBULATORY_CARE_PROVIDER_SITE_OTHER): Payer: Medicare HMO | Admitting: Family

## 2018-12-13 ENCOUNTER — Other Ambulatory Visit: Payer: Self-pay

## 2018-12-13 DIAGNOSIS — R6883 Chills (without fever): Secondary | ICD-10-CM

## 2018-12-13 DIAGNOSIS — R197 Diarrhea, unspecified: Secondary | ICD-10-CM | POA: Diagnosis not present

## 2018-12-13 DIAGNOSIS — R531 Weakness: Secondary | ICD-10-CM

## 2018-12-13 DIAGNOSIS — L97529 Non-pressure chronic ulcer of other part of left foot with unspecified severity: Secondary | ICD-10-CM | POA: Diagnosis not present

## 2018-12-13 DIAGNOSIS — E11621 Type 2 diabetes mellitus with foot ulcer: Secondary | ICD-10-CM

## 2018-12-13 NOTE — Progress Notes (Signed)
   Virtual Visit via telephone Note  I connected with Jose [name] on 12/13/18 at 11:45 AM by telephone and verified that I am speaking with the correct person using two identifiers. Jose [name] is currently located at home and no one is currently with her during visit. The provider, Evelina Dun, FNP is located in their office at time of visit.  I discussed the limitations, risks, security and privacy concerns of performing an evaluation and management service by telephone and the availability of in person appointments. I also discussed with the patient that there may be a patient responsible charge related to this service. The patient expressed understanding and agreed to proceed.   History and Present Illness:  Wife calls stating patient started having chills and weakness. They checked his O2 77% and they put on his CPAP mask and increased to high 80%. Wife states he was very weak, but feels better this morning. His O2 this morning was 93%.    He has had diarrhea on and off for the last 2-3 weeks.   He also has a diabetic ulcer on his left foot. He has a phone conference with the [facility] at 1 pm today.  Fever   This is a new problem. The current episode started yesterday. The problem occurs intermittently. The maximum temperature noted was 99 to 99.9 F. Associated symptoms include coughing, diarrhea, muscle aches and sleepiness. Pertinent negatives include no congestion, sore throat, urinary pain, vomiting or wheezing.      Review of Systems  Constitutional: Positive for fever.  HENT: Negative for congestion and sore throat.   Respiratory: Positive for cough. Negative for wheezing.   Gastrointestinal: Positive for diarrhea. Negative for vomiting.  Genitourinary: Negative for dysuria.  All other systems reviewed and are negative.   Observations/Objective: Weakness noted, wife doing the talking Assessment and Plan: Jose [name] comes in today with chief complaint of No chief  complaint on file.   Diagnosis and orders addressed:  1. Weakness  2. Diarrhea, unspecified type  3. Diabetic ulcer of left foot associated with type 2 diabetes mellitus, unspecified part of foot, unspecified ulcer stage ([place])  4. Chills  Given symptoms, patient needs to be seen face to face. Wife will call EMS and go to ED Wife does not want to go to ED because of COVID, discussed risks and he needs to be checked out         I discussed the assessment and treatment plan with the patient. The patient was provided an opportunity to ask questions and all were answered. The patient agreed with the plan and demonstrated an understanding of the instructions.   The patient was advised to call back or seek an in-person evaluation if the symptoms worsen or if the condition fails to improve as anticipated.  The above assessment and management plan was discussed with the patient. The patient verbalized understanding of and has agreed to the management plan. Patient is aware to call the clinic if symptoms persist or worsen. Patient is aware when to return to the clinic for a follow-up visit. Patient educated on when it is appropriate to go to the emergency department.    Call ended 12:122 pm, I provided 27 minutes of non-face-to-face time during this encounter.    Evelina Dun, FNP

## 2018-12-19 ENCOUNTER — Inpatient Hospital Stay (HOSPITAL_COMMUNITY)
Admission: EM | Admit: 2018-12-19 | Discharge: 2018-12-22 | DRG: 291 | Disposition: A | Discharge status: Home Health | Payer: Medicare HMO | Attending: Family Medicine | Admitting: Family Medicine

## 2018-12-19 ENCOUNTER — Emergency Department (HOSPITAL_COMMUNITY): Payer: Medicare HMO

## 2018-12-19 ENCOUNTER — Other Ambulatory Visit: Payer: Self-pay

## 2018-12-19 ENCOUNTER — Encounter (HOSPITAL_COMMUNITY): Payer: Self-pay | Admitting: Emergency Medicine

## 2018-12-19 DIAGNOSIS — R2240 Localized swelling, mass and lump, unspecified lower limb: Secondary | ICD-10-CM | POA: Diagnosis not present

## 2018-12-19 DIAGNOSIS — N183 Chronic kidney disease, stage 3 unspecified: Secondary | ICD-10-CM | POA: Diagnosis present

## 2018-12-19 DIAGNOSIS — I25110 Atherosclerotic heart disease of native coronary artery with unstable angina pectoris: Secondary | ICD-10-CM | POA: Diagnosis present

## 2018-12-19 DIAGNOSIS — L03116 Cellulitis of left lower limb: Secondary | ICD-10-CM | POA: Diagnosis not present

## 2018-12-19 DIAGNOSIS — Z79899 Other long term (current) drug therapy: Secondary | ICD-10-CM

## 2018-12-19 DIAGNOSIS — E039 Hypothyroidism, unspecified: Secondary | ICD-10-CM | POA: Diagnosis present

## 2018-12-19 DIAGNOSIS — Z794 Long term (current) use of insulin: Secondary | ICD-10-CM

## 2018-12-19 DIAGNOSIS — J449 Chronic obstructive pulmonary disease, unspecified: Secondary | ICD-10-CM | POA: Diagnosis present

## 2018-12-19 DIAGNOSIS — Z888 Allergy status to other drugs, medicaments and biological substances status: Secondary | ICD-10-CM

## 2018-12-19 DIAGNOSIS — G629 Polyneuropathy, unspecified: Secondary | ICD-10-CM | POA: Diagnosis present

## 2018-12-19 DIAGNOSIS — I130 Hypertensive heart and chronic kidney disease with heart failure and stage 1 through stage 4 chronic kidney disease, or unspecified chronic kidney disease: Secondary | ICD-10-CM | POA: Diagnosis not present

## 2018-12-19 DIAGNOSIS — I739 Peripheral vascular disease, unspecified: Secondary | ICD-10-CM | POA: Diagnosis present

## 2018-12-19 DIAGNOSIS — Z6839 Body mass index (BMI) 39.0-39.9, adult: Secondary | ICD-10-CM

## 2018-12-19 DIAGNOSIS — Z87891 Personal history of nicotine dependence: Secondary | ICD-10-CM

## 2018-12-19 DIAGNOSIS — Z79890 Hormone replacement therapy (postmenopausal): Secondary | ICD-10-CM

## 2018-12-19 DIAGNOSIS — L97509 Non-pressure chronic ulcer of other part of unspecified foot with unspecified severity: Secondary | ICD-10-CM | POA: Diagnosis present

## 2018-12-19 DIAGNOSIS — E1151 Type 2 diabetes mellitus with diabetic peripheral angiopathy without gangrene: Secondary | ICD-10-CM | POA: Diagnosis present

## 2018-12-19 DIAGNOSIS — K219 Gastro-esophageal reflux disease without esophagitis: Secondary | ICD-10-CM

## 2018-12-19 DIAGNOSIS — I252 Old myocardial infarction: Secondary | ICD-10-CM

## 2018-12-19 DIAGNOSIS — Z7902 Long term (current) use of antithrombotics/antiplatelets: Secondary | ICD-10-CM

## 2018-12-19 DIAGNOSIS — D631 Anemia in chronic kidney disease: Secondary | ICD-10-CM | POA: Diagnosis present

## 2018-12-19 DIAGNOSIS — Z8673 Personal history of transient ischemic attack (TIA), and cerebral infarction without residual deficits: Secondary | ICD-10-CM

## 2018-12-19 DIAGNOSIS — Z807 Family history of other malignant neoplasms of lymphoid, hematopoietic and related tissues: Secondary | ICD-10-CM

## 2018-12-19 DIAGNOSIS — E1159 Type 2 diabetes mellitus with other circulatory complications: Secondary | ICD-10-CM

## 2018-12-19 DIAGNOSIS — E1169 Type 2 diabetes mellitus with other specified complication: Secondary | ICD-10-CM | POA: Diagnosis present

## 2018-12-19 DIAGNOSIS — Z9861 Coronary angioplasty status: Secondary | ICD-10-CM

## 2018-12-19 DIAGNOSIS — Z8249 Family history of ischemic heart disease and other diseases of the circulatory system: Secondary | ICD-10-CM

## 2018-12-19 DIAGNOSIS — I779 Disorder of arteries and arterioles, unspecified: Secondary | ICD-10-CM | POA: Diagnosis present

## 2018-12-19 DIAGNOSIS — R531 Weakness: Secondary | ICD-10-CM | POA: Diagnosis not present

## 2018-12-19 DIAGNOSIS — I152 Hypertension secondary to endocrine disorders: Secondary | ICD-10-CM

## 2018-12-19 DIAGNOSIS — I5043 Acute on chronic combined systolic (congestive) and diastolic (congestive) heart failure: Secondary | ICD-10-CM | POA: Diagnosis present

## 2018-12-19 DIAGNOSIS — Z79891 Long term (current) use of opiate analgesic: Secondary | ICD-10-CM

## 2018-12-19 DIAGNOSIS — M79672 Pain in left foot: Secondary | ICD-10-CM | POA: Diagnosis not present

## 2018-12-19 DIAGNOSIS — R0602 Shortness of breath: Secondary | ICD-10-CM | POA: Diagnosis not present

## 2018-12-19 DIAGNOSIS — N179 Acute kidney failure, unspecified: Secondary | ICD-10-CM | POA: Diagnosis present

## 2018-12-19 DIAGNOSIS — F419 Anxiety disorder, unspecified: Secondary | ICD-10-CM | POA: Diagnosis present

## 2018-12-19 DIAGNOSIS — I10 Essential (primary) hypertension: Secondary | ICD-10-CM

## 2018-12-19 DIAGNOSIS — I480 Paroxysmal atrial fibrillation: Secondary | ICD-10-CM | POA: Diagnosis present

## 2018-12-19 DIAGNOSIS — I2510 Atherosclerotic heart disease of native coronary artery without angina pectoris: Secondary | ICD-10-CM | POA: Diagnosis present

## 2018-12-19 DIAGNOSIS — E11621 Type 2 diabetes mellitus with foot ulcer: Secondary | ICD-10-CM

## 2018-12-19 DIAGNOSIS — Z833 Family history of diabetes mellitus: Secondary | ICD-10-CM

## 2018-12-19 DIAGNOSIS — R600 Localized edema: Secondary | ICD-10-CM

## 2018-12-19 DIAGNOSIS — J9611 Chronic respiratory failure with hypoxia: Secondary | ICD-10-CM | POA: Diagnosis present

## 2018-12-19 DIAGNOSIS — I5032 Chronic diastolic (congestive) heart failure: Secondary | ICD-10-CM | POA: Diagnosis present

## 2018-12-19 DIAGNOSIS — R000 Tachycardia, unspecified: Secondary | ICD-10-CM | POA: Diagnosis not present

## 2018-12-19 DIAGNOSIS — E785 Hyperlipidemia, unspecified: Secondary | ICD-10-CM | POA: Diagnosis present

## 2018-12-19 DIAGNOSIS — E1122 Type 2 diabetes mellitus with diabetic chronic kidney disease: Secondary | ICD-10-CM | POA: Diagnosis present

## 2018-12-19 DIAGNOSIS — E1149 Type 2 diabetes mellitus with other diabetic neurological complication: Secondary | ICD-10-CM | POA: Diagnosis present

## 2018-12-19 DIAGNOSIS — E119 Type 2 diabetes mellitus without complications: Secondary | ICD-10-CM

## 2018-12-19 DIAGNOSIS — G4733 Obstructive sleep apnea (adult) (pediatric): Secondary | ICD-10-CM | POA: Diagnosis present

## 2018-12-19 DIAGNOSIS — Z7901 Long term (current) use of anticoagulants: Secondary | ICD-10-CM

## 2018-12-19 DIAGNOSIS — L97529 Non-pressure chronic ulcer of other part of left foot with unspecified severity: Secondary | ICD-10-CM

## 2018-12-19 LAB — COMPREHENSIVE METABOLIC PANEL
ALT: 19 U/L (ref 0–44)
AST: 16 U/L (ref 15–41)
Albumin: 2.7 g/dL — ABNORMAL LOW (ref 3.5–5.0)
Alkaline Phosphatase: 55 U/L (ref 38–126)
Anion gap: 11 (ref 5–15)
BUN: 34 mg/dL — ABNORMAL HIGH (ref 8–23)
CO2: 29 mmol/L (ref 22–32)
Calcium: 8.9 mg/dL (ref 8.9–10.3)
Chloride: 97 mmol/L — ABNORMAL LOW (ref 98–111)
Creatinine, Ser: 1.63 mg/dL — ABNORMAL HIGH (ref 0.61–1.24)
GFR calc Af Amer: 50 mL/min — ABNORMAL LOW (ref >60)
GFR calc non Af Amer: 43 mL/min — ABNORMAL LOW (ref >60)
Glucose, Bld: 167 mg/dL — ABNORMAL HIGH (ref 70–99)
Potassium: 4.2 mmol/L (ref 3.5–5.1)
Sodium: 137 mmol/L (ref 135–145)
Total Bilirubin: 0.7 mg/dL (ref 0.3–1.2)
Total Protein: 7.5 g/dL (ref 6.5–8.1)

## 2018-12-19 LAB — CBC WITH DIFFERENTIAL/PLATELET
Abs Immature Granulocytes: 0.09 10*3/uL — ABNORMAL HIGH (ref 0.00–0.07)
Basophils Absolute: 0 10*3/uL (ref 0.0–0.1)
Basophils Relative: 0 %
Eosinophils Absolute: 0.2 10*3/uL (ref 0.0–0.5)
Eosinophils Relative: 2 %
HCT: 38.4 % — ABNORMAL LOW (ref 39.0–52.0)
Hemoglobin: 11.6 g/dL — ABNORMAL LOW (ref 13.0–17.0)
Immature Granulocytes: 1 %
Lymphocytes Relative: 12 %
Lymphs Abs: 1.2 10*3/uL (ref 0.7–4.0)
MCH: 27.8 pg (ref 26.0–34.0)
MCHC: 30.2 g/dL (ref 30.0–36.0)
MCV: 91.9 fL (ref 80.0–100.0)
Monocytes Absolute: 0.7 10*3/uL (ref 0.1–1.0)
Monocytes Relative: 7 %
Neutro Abs: 8 10*3/uL — ABNORMAL HIGH (ref 1.7–7.7)
Neutrophils Relative %: 78 %
Platelets: 312 10*3/uL (ref 150–400)
RBC: 4.18 MIL/uL — ABNORMAL LOW (ref 4.22–5.81)
RDW: 14.3 % (ref 11.5–15.5)
WBC: 10.2 10*3/uL (ref 4.0–10.5)
nRBC: 0 % (ref 0.0–0.2)

## 2018-12-19 LAB — BRAIN NATRIURETIC PEPTIDE: B Natriuretic Peptide: 570 pg/mL — ABNORMAL HIGH (ref 0.0–100.0)

## 2018-12-19 LAB — GLUCOSE, CAPILLARY: Glucose-Capillary: 191 mg/dL — ABNORMAL HIGH (ref 70–99)

## 2018-12-19 MED ORDER — INSULIN ASPART 100 UNIT/ML ~~LOC~~ SOLN
0.0000 [IU] | Freq: Three times a day (TID) | SUBCUTANEOUS | Status: DC
  Administered 2018-12-20 (×2): 2 [IU] via SUBCUTANEOUS
  Administered 2018-12-20: 3 [IU] via SUBCUTANEOUS
  Administered 2018-12-21 – 2018-12-22 (×2): 1 [IU] via SUBCUTANEOUS

## 2018-12-19 MED ORDER — CLOPIDOGREL BISULFATE 75 MG PO TABS
75.0000 mg | ORAL_TABLET | Freq: Every day | ORAL | Status: DC
  Administered 2018-12-20 – 2018-12-22 (×3): 75 mg via ORAL
  Filled 2018-12-19 (×3): qty 1

## 2018-12-19 MED ORDER — ONDANSETRON HCL 4 MG PO TABS: 4.0000 mg | ORAL_TABLET | Freq: Four times a day (QID) | ORAL | Status: DC | PRN

## 2018-12-19 MED ORDER — POLYETHYLENE GLYCOL 3350 17 G PO PACK: 17.0000 g | PACK | Freq: Every day | ORAL | Status: DC | PRN

## 2018-12-19 MED ORDER — METOPROLOL SUCCINATE ER 25 MG PO TB24
12.5000 mg | ORAL_TABLET | Freq: Every day | ORAL | Status: DC
  Administered 2018-12-20: 12.5 mg via ORAL
  Filled 2018-12-19 (×2): qty 1

## 2018-12-19 MED ORDER — ACETAMINOPHEN 325 MG PO TABS: 650.0000 mg | ORAL_TABLET | Freq: Four times a day (QID) | ORAL | Status: DC | PRN

## 2018-12-19 MED ORDER — ISOSORBIDE MONONITRATE ER 30 MG PO TB24
15.0000 mg | ORAL_TABLET | Freq: Every day | ORAL | Status: DC
  Administered 2018-12-20: 15 mg via ORAL
  Filled 2018-12-19 (×2): qty 1

## 2018-12-19 MED ORDER — VANCOMYCIN HCL IN DEXTROSE 1-5 GM/200ML-% IV SOLN
1000.0000 mg | Freq: Once | INTRAVENOUS | Status: AC
  Administered 2018-12-19: 1000 mg via INTRAVENOUS
  Filled 2018-12-19: qty 200

## 2018-12-19 MED ORDER — SODIUM CHLORIDE 0.9 % IV SOLN
2.0000 g | INTRAVENOUS | Status: DC
  Administered 2018-12-19: 2 g via INTRAVENOUS
  Filled 2018-12-19: qty 20

## 2018-12-19 MED ORDER — FUROSEMIDE 40 MG PO TABS
40.0000 mg | ORAL_TABLET | Freq: Two times a day (BID) | ORAL | Status: DC
  Administered 2018-12-20 – 2018-12-22 (×5): 40 mg via ORAL
  Filled 2018-12-19 (×5): qty 1

## 2018-12-19 MED ORDER — UMECLIDINIUM BROMIDE 62.5 MCG/INH IN AEPB
1.0000 | INHALATION_SPRAY | Freq: Every day | RESPIRATORY_TRACT | Status: DC
  Administered 2018-12-20: 1 via RESPIRATORY_TRACT
  Filled 2018-12-19: qty 7

## 2018-12-19 MED ORDER — ALBUTEROL SULFATE (2.5 MG/3ML) 0.083% IN NEBU
3.0000 mL | INHALATION_SOLUTION | RESPIRATORY_TRACT | Status: DC | PRN
  Administered 2018-12-19: 22:00:00 3 mL via RESPIRATORY_TRACT
  Filled 2018-12-19: qty 3

## 2018-12-19 MED ORDER — FUROSEMIDE 10 MG/ML IJ SOLN
40.0000 mg | Freq: Once | INTRAMUSCULAR | Status: AC
  Administered 2018-12-19: 40 mg via INTRAVENOUS
  Filled 2018-12-19: qty 4

## 2018-12-19 MED ORDER — ACETAMINOPHEN 650 MG RE SUPP: 650.0000 mg | Freq: Four times a day (QID) | RECTAL | Status: DC | PRN

## 2018-12-19 MED ORDER — ONDANSETRON HCL 4 MG/2ML IJ SOLN: 4.0000 mg | Freq: Four times a day (QID) | INTRAMUSCULAR | Status: DC | PRN

## 2018-12-19 MED ORDER — INSULIN GLARGINE 100 UNIT/ML ~~LOC~~ SOLN
55.0000 [IU] | Freq: Every day | SUBCUTANEOUS | Status: DC
  Administered 2018-12-20: 55 [IU] via SUBCUTANEOUS
  Filled 2018-12-19 (×3): qty 0.55

## 2018-12-19 MED ORDER — LEVOTHYROXINE SODIUM 50 MCG PO TABS
50.0000 ug | ORAL_TABLET | Freq: Every day | ORAL | Status: DC
  Administered 2018-12-20 – 2018-12-22 (×2): 50 ug via ORAL
  Filled 2018-12-19 (×2): qty 1

## 2018-12-19 MED ORDER — SERTRALINE HCL 50 MG PO TABS
100.0000 mg | ORAL_TABLET | Freq: Every day | ORAL | Status: DC
  Administered 2018-12-19 – 2018-12-22 (×4): 100 mg via ORAL
  Filled 2018-12-19 (×4): qty 2

## 2018-12-19 MED ORDER — INSULIN GLARGINE 100 UNIT/ML ~~LOC~~ SOLN: 10.0000 [IU] | SUBCUTANEOUS | Status: DC

## 2018-12-19 MED ORDER — INSULIN GLARGINE 100 UNIT/ML ~~LOC~~ SOLN
60.0000 [IU] | Freq: Every day | SUBCUTANEOUS | Status: DC
  Administered 2018-12-19 – 2018-12-20 (×2): 60 [IU] via SUBCUTANEOUS
  Filled 2018-12-19 (×3): qty 0.6

## 2018-12-19 MED ORDER — EZETIMIBE 10 MG PO TABS
10.0000 mg | ORAL_TABLET | Freq: Every day | ORAL | Status: DC
  Administered 2018-12-20 – 2018-12-22 (×3): 10 mg via ORAL
  Filled 2018-12-19 (×3): qty 1

## 2018-12-19 MED ORDER — FUROSEMIDE 10 MG/ML IJ SOLN: 40.0000 mg | Freq: Once | INTRAMUSCULAR | Status: DC

## 2018-12-19 MED ORDER — TIOTROPIUM BROMIDE MONOHYDRATE 18 MCG IN CAPS: 18.0000 ug | ORAL_CAPSULE | Freq: Every day | RESPIRATORY_TRACT | Status: DC

## 2018-12-19 MED ORDER — VANCOMYCIN HCL IN DEXTROSE 1-5 GM/200ML-% IV SOLN
1000.0000 mg | Freq: Two times a day (BID) | INTRAVENOUS | Status: DC
  Administered 2018-12-20 – 2018-12-21 (×4): 1000 mg via INTRAVENOUS
  Filled 2018-12-19 (×5): qty 200

## 2018-12-19 MED ORDER — OXYCODONE-ACETAMINOPHEN 5-325 MG PO TABS
1.0000 | ORAL_TABLET | Freq: Four times a day (QID) | ORAL | Status: DC | PRN
  Administered 2018-12-20 – 2018-12-22 (×2): 1 via ORAL
  Filled 2018-12-19 (×2): qty 1

## 2018-12-19 MED ORDER — APIXABAN 5 MG PO TABS
5.0000 mg | ORAL_TABLET | Freq: Two times a day (BID) | ORAL | Status: DC
  Administered 2018-12-19 – 2018-12-22 (×6): 5 mg via ORAL
  Filled 2018-12-19 (×6): qty 1

## 2018-12-19 NOTE — Progress Notes (Signed)
Pharmacy Antibiotic Note  KAY RICCIUTI is a 68 y.o. male admitted on 12/19/2018 with cellulitis.  Pharmacy has been consulted for vancomycin dosing.  Plan: Vancomycin 1000mg  IV every 12 hours.  Goal trough 15-20 mcg/mL.  Height: 6' (182.9 cm) Weight: 291 lb 7.2 oz (132.2 kg) IBW/kg (Calculated) : 77.6  Temp (24hrs), Avg:99 F (37.2 C), Min:98.4 F (36.9 C), Max:99.3 F (37.4 C)  Recent Labs  Lab 12/19/18 1415  WBC 10.2  CREATININE 1.63*    Estimated Creatinine Clearance: 61.8 mL/min (A) (by C-G formula based on SCr of 1.63 mg/dL (H)).    Allergies  Allergen Reactions  . Amiodarone     Weakness during 2018 admission  . Atorvastatin     Tremors  . Simvastatin     Muscle weakness    Antimicrobials this admission: 4/21 vancomycin >>  4/21 ceftriaxone >>   Microbiology results: None  Thank you for allowing pharmacy to be a part of this patient's care.  Donna Christen Laurinda Carreno 12/19/2018 8:01 PM

## 2018-12-19 NOTE — ED Notes (Signed)
Pt placed on 3L nasal cannula. Sats increased to 98%.

## 2018-12-19 NOTE — ED Triage Notes (Addendum)
Pt states he was sent over by PCP for bilateral leg swelling x 2 weeks. Left leg notably more swollen and red than RT leg. Pt also endorses increase in generalized weakness. Pt found to have sats 76% on RA. Pt reports 3L oxygen PRN at home, but pt was not wearing any when he arrived. Denies increased SOB, cough, or fever.

## 2018-12-19 NOTE — H&P (Addendum)
History and Physical    Jose [name]:[id number] DOB: 1951/01/09 DOA: 12/19/2018  PCP: Dettinger, Fransisca Kaufmann, MD   Patient coming from: Home  I have personally briefly reviewed patient's old medical records in [place]  Chief Complaint: Bilat leg swelling  HPI: Jose [name] is a 68 y.o. male with medical history significant for COPD, hypertension, morbid obesity, OSA, DM 2, diastolic CHF, CAD, paroxysmal atrial fibrillation, hypothyroidism, peripheral artery disease, left diabetic foot ulcer, who presented to the ED with complaints of bilateral leg swelling, left greater than right.  Patient reports over the past week his left leg started swelling worse than right with associated redness.  He has neuropathy so he is unable to tell if he has been having pain.  Denies fever or chills.  He denies difficulty breathing or cough, no chest pain, no sick contacts, he has followed lockdown restrictions.  He does not think he has added weight.  Patient is on chronic 3 L O2 mostly as needed.  ED Course: Temperature 99.3.  Systolic blood pressure 811B- 200, O2 sats initially 76%, patient placed on 3 L O2 with O2 sats increasing to > 98%.  Creatinine elevated at 1.6, baseline 1.2-1.3.  WBC 10.2.  BNP elevated at 570.  Two-view chest x-ray negative for acute abnormality, no edema or consolidation.  Left lower extremity venous Dopplers negative for DVT.  Left foot x-ray negative for acute abnormality-soft tissue swelling  May reflect cellulitis.  Started on IV vancomycin, 40 IV Lasix given hospitalist called to admit for decompensated CHF and cellulitis.  Review of Systems: As per HPI all other systems reviewed and negative.  Past Medical History:  Diagnosis Date  . Anemia   . Anxiety   . CAD (coronary artery disease)    a. prior silent inferior MI, NSTEMI 07/2014 with chronically occluded RCA s/p unsuccessful PCI. b. STEMI 07/2017 with multivessel disease s/p failed PTCA of distal LAD.  [name] Kitchen  Carotid artery disease ([place])   . Cataract   . Chronic combined systolic and diastolic CHF (congestive heart failure) ([place])   . Chronic respiratory failure ([place])    a. Placed on home O2 01/2016.  [name] Kitchen CKD (chronic kidney disease), stage III ([place])   . COPD (chronic obstructive pulmonary disease) ([place])   . Diabetes mellitus ([place])   . Former tobacco use   . Hyperlipidemia   . Hypertension   . Hyponatremia   . Morbid obesity ([place])   . Neuropathy   . PAF (paroxysmal atrial fibrillation) ([place])    a. dx 06/2017.  [name] Kitchen Sleep apnea   . Stroke ([place]) 06/2017  . TIA (transient ischemic attack) 2017   a. hemispheric L carotid artery syndrome in setting of hypotension.    Past Surgical History:  Procedure Laterality Date  . BACK SURGERY    . CORONARY BALLOON ANGIOPLASTY N/A 07/14/2017   Procedure: CORONARY BALLOON ANGIOPLASTY;  Surgeon: Martinique, Peter M, MD;  Location: [facility];  Service: Cardiovascular;  Laterality: N/A;  . EYE SURGERY    . INTRAVASCULAR ULTRASOUND/IVUS N/A 07/14/2017   Procedure: Intravascular Ultrasound/IVUS;  Surgeon: Martinique, Peter M, MD;  Location: [facility];  Service: Cardiovascular;  Laterality: N/A;  . LEFT HEART CATH AND CORONARY ANGIOGRAPHY N/A 07/14/2017   Procedure: LEFT HEART CATH AND CORONARY ANGIOGRAPHY;  Surgeon: Martinique, Peter M, MD;  Location: [facility];  Service: Cardiovascular;  Laterality: N/A;  . LEFT HEART CATHETERIZATION WITH CORONARY ANGIOGRAM N/A 08/28/2014   Procedure:  LEFT HEART CATHETERIZATION WITH CORONARY ANGIOGRAM;  Surgeon: Clent Demark, MD;  Location: [facility];  Service: Cardiovascular;  Laterality: N/A;  . PERIPHERAL VASCULAR CATHETERIZATION Bilateral 03/22/2016   Procedure: Carotid Angiography;  Surgeon: Elam Dutch, MD;  Location: [facility];  Service: Cardiovascular;  Laterality: Bilateral;     reports that he quit smoking about 11 years ago. His smoking use included cigarettes. He has a 135.00  pack-year smoking history. He has never used smokeless tobacco. He reports that he does not drink alcohol or use drugs.  Allergies  Allergen Reactions  . Amiodarone     Weakness during 2018 admission  . Atorvastatin     Tremors  . Simvastatin     Muscle weakness    Family History  Problem Relation Age of Onset  . Hypertension Father   . Heart disease Father   . Cancer Mother        uterine  . Diabetes Mother   . Diabetes Brother   . Cancer Brother        lymphoma  . Diabetes Brother     Prior to Admission medications   Medication Sig Start Date End Date Taking? Authorizing Provider  apixaban (ELIQUIS) 5 MG TABS tablet Take 1 tablet (5 mg total) by mouth 2 (two) times daily. 03/08/18  Yes Dettinger, Fransisca Kaufmann, MD  clopidogrel (PLAVIX) 75 MG tablet Take 1 tablet (75 mg total) by mouth daily. 03/23/16  Yes Donzetta Starch, NP  ergocalciferol (VITAMIN D2) 50000 UNITS capsule Take 50,000 Units by mouth 2 (two) times a week. wednesdays and saturdays   Yes [provider]  ezetimibe (ZETIA) 10 MG tablet Take 1 tablet (10 mg total) by mouth daily. 08/10/17  Yes Almyra Deforest, PA  famotidine (PEPCID) 20 MG tablet Take 1 tablet (20 mg total) by mouth 2 (two) times daily. Patient taking differently: Take 20 mg by mouth every morning.  12/01/18  Yes Dettinger, Fransisca Kaufmann, MD  ferrous sulfate 325 (65 FE) MG tablet Take 325 mg by mouth 3 (three) times daily with meals.   Yes [provider]  furosemide (LASIX) 40 MG tablet Take 40-60 mg by mouth See admin instructions. Takes 40mg  twice daily. For fluid increase, take additional one-half tablet in the morning as needed   Yes [provider]  glipiZIDE (GLUCOTROL) 5 MG tablet Take 5 mg by mouth 2 (two) times daily.    Yes [provider]  insulin aspart (NOVOLOG) 100 UNIT/ML injection Inject 30 Units into the skin 3 (three) times daily before meals.    Yes [provider]  insulin glargine (LANTUS) 100 UNIT/ML  injection Inject 0.3-0.6 mLs (30-60 Units total) into the skin See admin instructions. 50 units in the AM and 60 units in the PM Patient taking differently: Inject 55-60 Units into the skin See admin instructions. 55 units in the AM and 60 units in the PM 03/23/16  Yes Biby, Massie Kluver, NP  isosorbide mononitrate (IMDUR) 30 MG 24 hr tablet Take 0.5 tablets (15 mg total) by mouth daily. 04/10/18  Yes Dettinger, Fransisca Kaufmann, MD  levothyroxine (SYNTHROID, LEVOTHROID) 50 MCG tablet TAKE 1 TABLET BY MOUTH ONCE DAILY BEFORE BREAKFAST Patient taking differently: Take 50 mcg by mouth daily before breakfast.  04/10/18  Yes Dettinger, Fransisca Kaufmann, MD  magnesium oxide (MAG-OX) 400 MG tablet Take 1 tablet (400 mg total) by mouth daily. 03/04/16  Yes Arnoldo Lenis, MD  metFORMIN (GLUCOPHAGE) 1000 MG tablet Take 1  tablet (1,000 mg total) by mouth 2 (two) times daily with a meal. 08/30/14  Yes Charolette Forward, MD  metoprolol succinate (TOPROL-XL) 25 MG 24 hr tablet Take 0.5 tablets (12.5 mg total) by mouth daily. 04/10/18  Yes Dettinger, Fransisca Kaufmann, MD  Multiple Vitamins-Minerals (ICAPS AREDS 2) CAPS Take 1 capsule by mouth 2 (two) times daily.   Yes [provider]  nitroGLYCERIN (NITROSTAT) 0.4 MG SL tablet Place 1 tablet (0.4 mg total) under the tongue every 5 (five) minutes x 3 doses as needed for chest pain. 08/29/14  Yes Charolette Forward, MD  Omega-3 Fatty Acids (FISH OIL) 1000 MG CAPS Take 2,000-3,000 mg See admin instructions by mouth. Take 2000 mg every morning Take 3000 mg at noon and evening.   Yes [provider]  oxyCODONE-acetaminophen (PERCOCET/ROXICET) 5-325 MG per tablet Take 2 tablets by mouth every 6 (six) hours as needed. Take eight tablets daily as needed for pain per family and list. For bad  Back pain   Yes [provider]  sertraline (ZOLOFT) 100 MG tablet Take 100 mg by mouth daily.   Yes [provider]  tiotropium (SPIRIVA) 18 MCG inhalation capsule Place 18 mcg into  inhaler and inhale at bedtime.    Yes [provider]  vitamin B-12 (CYANOCOBALAMIN) 1000 MCG tablet Take 1,000 mcg by mouth daily.   Yes [provider]  Continuous Blood Gluc Receiver (FREESTYLE LIBRE 14 DAY READER) DEVI 1 each by Does not apply route 4 (four) times daily. 04/10/18   Dettinger, Fransisca Kaufmann, MD  Continuous Blood Gluc Sensor (FREESTYLE LIBRE 14 DAY SENSOR) MISC 1 each by Does not apply route every 14 (fourteen) days. 04/10/18   Dettinger, Fransisca Kaufmann, MD  linaclotide Rolan Lipa) 145 MCG CAPS capsule Take 1 capsule (145 mcg total) by mouth daily before breakfast. Patient not taking: Reported on 12/19/2018 12/01/18   Dettinger, Fransisca Kaufmann, MD    Physical Exam: Vitals:   12/19/18 1400 12/19/18 1430 12/19/18 1530 12/19/18 1600  BP: (!) 194/108 (!) 197/80 (!) 185/70 (!) 185/85  Pulse: 98 96 96 95  Resp: 20 19 18 20   Temp:      SpO2: 99% 100% 100% 100%  Weight:      Height:        Constitutional: NAD, calm, comfortable Vitals:   12/19/18 1400 12/19/18 1430 12/19/18 1530 12/19/18 1600  BP: (!) 194/108 (!) 197/80 (!) 185/70 (!) 185/85  Pulse: 98 96 96 95  Resp: 20 19 18 20   Temp:      SpO2: 99% 100% 100% 100%  Weight:      Height:       Eyes: PERRL, lids and conjunctivae normal ENMT: Mucous membranes are moist. Posterior pharynx clear of any exudate or lesions. Neck: normal, supple, no masses, no thyromegaly Respiratory: clear to auscultation bilaterally, no wheezing, no crackles. Normal respiratory effort. No accessory muscle use.  Cardiovascular: Regular rate and rhythm, 1+ pitting pedal edema to knees L>>R. [name] Kitchen 2+ pedal pulses. Abdomen: Abdomen distended- normal for him per patient, no tenderness, no masses palpated. No hepatosplenomegaly. Musculoskeletal: no clubbing / cyanosis. No joint deformity upper and lower extremities. Good ROM, no contractures. Normal muscle tone.  Skin: Right lower extremity erythema with mild differential warmth from ankle to just below  knee.  Chronic wound on the lateral side of left foot, appears infected with mild purulence. ~5 by 5cm. Neurologic: CN 2-12 grossly intact. Strength 5/5 in all 4.  Psychiatric: Normal judgment and insight. Alert and  oriented x 3. Normal mood.            Labs on Admission: I have personally reviewed following labs and imaging studies  CBC: Recent Labs  Lab 12/19/18 1415  WBC 10.2  NEUTROABS 8.0*  HGB 11.6*  HCT 38.4*  MCV 91.9  PLT 998   Basic Metabolic Panel: Recent Labs  Lab 12/19/18 1415  NA 137  K 4.2  CL 97*  CO2 29  GLUCOSE 167*  BUN 34*  CREATININE 1.63*  CALCIUM 8.9   Liver Function Tests: Recent Labs  Lab 12/19/18 1415  AST 16  ALT 19  ALKPHOS 55  BILITOT 0.7  PROT 7.5  ALBUMIN 2.7*    Radiological Exams on Admission: Dg Chest 2 View  Result Date: 12/19/2018 CLINICAL DATA:  Shortness of breath and lower extremity edema EXAM: CHEST - 2 VIEW COMPARISON:  July 23, 2017 FINDINGS: There is no appreciable edema or consolidation. There is cardiomegaly with pulmonary vascularity within normal limits. No adenopathy. There is mild degenerative change in thoracic spine. IMPRESSION: Stable cardiac prominence.  No edema or consolidation. Electronically Signed   By: Lowella Grip III M.D.   On: 12/19/2018 15:15   US Venous Img Lower  Left (dvt Study)  Result Date: 12/19/2018 CLINICAL DATA:  Leg swelling for 2 weeks EXAM: LEFT LOWER EXTREMITY VENOUS DUPLEX ULTRASOUND TECHNIQUE: Doppler venous assessment of the left lower extremity deep venous system was performed, including characterization of spectral flow, compressibility, and phasicity. COMPARISON:  None. FINDINGS: There is complete compressibility of the left common femoral, femoral, and popliteal veins. Doppler analysis demonstrates respiratory phasicity and augmentation of flow with calf compression. No obvious superficial vein or calf vein thrombosis. IMPRESSION: No evidence of left lower extremity  DVT. Electronically Signed   By: Marybelle Killings M.D.   On: 12/19/2018 14:47   Dg Foot Complete Left  Result Date: 12/19/2018 CLINICAL DATA:  Left foot pain EXAM: LEFT FOOT - COMPLETE 3+ VIEW COMPARISON:  None. FINDINGS: There is no evidence of fracture or dislocation. There is no evidence of arthropathy or other focal bone abnormality. There are enthesopathic changes at the Achilles tendon insertion. There is a small plantar calcaneal spur. There is soft tissue swelling along the dorsal aspect of the foot. Soft tissue wound along the lateral aspect of the midfoot. IMPRESSION: 1.  No acute osseous injury of the left foot. 2. Soft tissue swelling along the dorsal aspect of the foot which may reflect reactive changes versus cellulitis. Electronically Signed   By: Kathreen Devoid   On: 12/19/2018 16:53    EKG: Independently reviewed.  Sinus rhythm, rate 98, QTc 473.  Old Q waves 2 3 aVF, V4 through V6.  Assessment/Plan Active Problems:   Cellulitis   Left Lower extremity Cellulitis- swelling, erythema, mild differential warmth, chronic wound on lateral side of sole of same extremity. LLE Venous Doppler negative for DVT WBC 10.2, T-max in ED 99.3.  IV vancomycin started in ED.  Patient at this time rules out for sepsis. -Wound care consult -Continue IV vancomycin, add IV ceftriaxone, securing chronic wound and diabetes -CBC, BMP a.m.  Mild decompensated chronic Diastolic CHF- appears mild with lower extremity swelling, chest x-ray without congestion or edema.  On home 3 L O2.  Without dyspnea.  But BNP elevated 570, compared to prior to 258.  Morbidly obese but weights over the past 6 months unchanged.  Patient is on Lasix 40 mg twice daily.  IV Lasix 40 mg x 1  given in the ED -as he is not significantly volume overloaded, will resume home Lasix 40mg  BID in a.m -Reassess if continued IV Lasix needed, to avoid worsening any renal insufficiency while on Vanco.  Diabetic foot ulcer-chronic on left side of  foot.  Appears infected with mild purulence.  Possible etiology of cellulitis. -IV antibiotics per above -Wound care consult  AKI on CKD-creatinine 1.6, baseline 1.2-1.3.  Likely cardiorenal. -Monitor BMP in a.m, monitor Cr closely with IV Lasix  COPD, OSA -Stable - Albuterol inh PRN, cont home bronchodilators - CPAP QHS  Paroxysmal atrial fibrillation-rate controlled, 80s to 90s, anticoagulated on Eliquis. -Cont home metoprolol 12.5 daily - Cont home Eliquis  Coronary artery disease- known three-vessel obstructive CAD, appears stable, no chest pain.  Follows with Dr. Harl Bowie. -Continue home Plavix, Eliquis, Imdur, metoprolol  DM2-random glucose 167. Hgba1c 06/2018- 7.7 -SSI - Hold home metformin, glipizide -Continue home Lantus at reduced dose 10u daily  Hypertension-systolic 431V to 400Q. -Continue home metoprolol, Imdur  DVT prophylaxis: Eliquis Code Status: Full Family Communication: None at bedside Consults called: None Admission status: Obs, tele  Bethena Roys MD [facility]  12/19/2018, 8:35 PM

## 2018-12-19 NOTE — ED Provider Notes (Signed)
[facility] EMERGENCY DEPARTMENT Provider Note   CSN: [id number] Arrival date & time: 12/19/18  1327    History   Chief Complaint Chief Complaint  Patient presents with  . Leg Swelling    HPI Jose [name] is a 68 y.o. male.     HPI  Presents with concern of dyspnea, and left lower extremity swelling. Patient has multiple medical issues including CAD, COPD, is a former smoker. History is provided to the patient, his wife via telephone, and chart review. Patient has had baseline inconsistent home oxygen use, but today was found to be hypoxic, with saturation in the 70s This did reportedly improve with nasal cannula. Reported the patient has also had decreased interactivity, fatigue, weakness, with inability to perform previously normal activities of daily living. Patient notes that during this illness, approximately 1-1/2 weeks long, he has developed increasing pain, redness throughout the left leg.  He has a history of a nonhealing wound on the left lateral foot, but redness, swelling are both new, ascending. Patient has substantial neuropathy, denies pain in the area. Patient has been working with his physician over the past 2 days, as evidenced on chart review, was encouraged to come here for evaluation.  Past Medical History:  Diagnosis Date  . Anemia   . Anxiety   . CAD (coronary artery disease)    a. prior silent inferior MI, NSTEMI 07/2014 with chronically occluded RCA s/p unsuccessful PCI. b. STEMI 07/2017 with multivessel disease s/p failed PTCA of distal LAD.  [name] Kitchen Carotid artery disease ([place])   . Cataract   . Chronic combined systolic and diastolic CHF (congestive heart failure) ([place])   . Chronic respiratory failure ([place])    a. Placed on home O2 01/2016.  [name] Kitchen CKD (chronic kidney disease), stage III ([place])   . COPD (chronic obstructive pulmonary disease) ([place])   . Diabetes mellitus ([place])   . Former tobacco use   . Hyperlipidemia   . Hypertension   . Hyponatremia    . Morbid obesity ([place])   . Neuropathy   . PAF (paroxysmal atrial fibrillation) ([place])    a. dx 06/2017.  [name] Kitchen Sleep apnea   . Stroke ([place]) 06/2017  . TIA (transient ischemic attack) 2017   a. hemispheric L carotid artery syndrome in setting of hypotension.    Patient Active Problem List   Diagnosis Date Noted  . Chronic venous insufficiency 09/27/2018  . Peripheral arterial disease ([place]) 09/27/2018  . Diabetic ulcer of left foot associated with type 2 diabetes mellitus ([place]) 09/27/2018  . Hypertriglyceridemia 07/11/2018  . Statin intolerance 07/11/2018  . Hypothyroidism 04/10/2018  . Carotid artery disease ([place]) 12/27/2017  . Atherosclerotic cardiovascular disease 10 year risk greater than 30% using 2013 ACC/AHA calculator 12/27/2017  . History of ST elevation myocardial infarction (STEMI) 09/08/2017  . Lacunar infarction ([place]) 07/29/2017  . CRI (chronic renal insufficiency), stage 3 (moderate) (HCC)   . Coronary artery disease involving native coronary artery of native heart with unstable angina pectoris ([place])   . PAF (paroxysmal atrial fibrillation) ([place])   . Chronic diastolic CHF (congestive heart failure) ([place])   . Hemispheric carotid artery syndrome   . Hyperlipidemia associated with type 2 diabetes mellitus ([place])   . Type 2 diabetes mellitus, controlled ([place])   . COPD (chronic obstructive pulmonary disease) ([place]) 02/24/2016  . Hypertension associated with diabetes ([place]) 02/24/2016  . Morbid obesity ([place]) 02/24/2016  . OSA (obstructive sleep apnea) 02/24/2016    Past Surgical History:  Procedure Laterality Date  .  BACK SURGERY    . CORONARY BALLOON ANGIOPLASTY N/A 07/14/2017   Procedure: CORONARY BALLOON ANGIOPLASTY;  Surgeon: Martinique, Peter M, MD;  Location: [place] [facility];  Service: Cardiovascular;  Laterality: N/A;  . EYE SURGERY    . INTRAVASCULAR ULTRASOUND/IVUS N/A 07/14/2017   Procedure: Intravascular Ultrasound/IVUS;  Surgeon: Martinique, Peter M, MD;  Location: [place] [facility];  Service: Cardiovascular;  Laterality: N/A;  . LEFT HEART CATH AND CORONARY ANGIOGRAPHY N/A 07/14/2017   Procedure: LEFT HEART CATH AND CORONARY ANGIOGRAPHY;  Surgeon: Martinique, Peter M, MD;  Location: [place] [facility];  Service: Cardiovascular;  Laterality: N/A;  . LEFT HEART CATHETERIZATION WITH CORONARY ANGIOGRAM N/A 08/28/2014   Procedure: LEFT HEART CATHETERIZATION WITH CORONARY ANGIOGRAM;  Surgeon: Clent Demark, MD;  Location: [facility] CATH LAB;  Service: Cardiovascular;  Laterality: N/A;  . PERIPHERAL VASCULAR CATHETERIZATION Bilateral 03/22/2016   Procedure: Carotid Angiography;  Surgeon: Elam Dutch, MD;  Location: [place] [facility];  Service: Cardiovascular;  Laterality: Bilateral;        Home Medications    Prior to Admission medications   Medication Sig Start Date End Date Taking? Authorizing Provider  apixaban (ELIQUIS) 5 MG TABS tablet Take 1 tablet (5 mg total) by mouth 2 (two) times daily. 03/08/18  Yes Dettinger, Fransisca Kaufmann, MD  clopidogrel (PLAVIX) 75 MG tablet Take 1 tablet (75 mg total) by mouth daily. 03/23/16  Yes Donzetta Starch, NP  ergocalciferol (VITAMIN D2) 50000 UNITS capsule Take 50,000 Units by mouth 2 (two) times a week. wednesdays and saturdays   Yes [provider]  ezetimibe (ZETIA) 10 MG tablet Take 1 tablet (10 mg total) by mouth daily. 08/10/17  Yes Almyra Deforest, PA  famotidine (PEPCID) 20 MG tablet Take 1 tablet (20 mg total) by mouth 2 (two) times daily. Patient taking differently: Take 20 mg by mouth every morning.  12/01/18  Yes Dettinger, Fransisca Kaufmann, MD  ferrous sulfate 325 (65 FE) MG tablet Take 325 mg by mouth 3 (three) times daily with meals.   Yes [provider]  furosemide (LASIX) 40 MG tablet Take 40-60 mg by mouth See admin instructions. Takes 40mg  twice daily. For fluid increase, take additional one-half tablet in the morning as needed   Yes [provider]  glipiZIDE (GLUCOTROL) 5 MG tablet Take 5 mg by  mouth 2 (two) times daily.    Yes [provider]  insulin aspart (NOVOLOG) 100 UNIT/ML injection Inject 30 Units into the skin 3 (three) times daily before meals.    Yes [provider]  insulin glargine (LANTUS) 100 UNIT/ML injection Inject 0.3-0.6 mLs (30-60 Units total) into the skin See admin instructions. 50 units in the AM and 60 units in the PM Patient taking differently: Inject 55-60 Units into the skin See admin instructions. 55 units in the AM and 60 units in the PM 03/23/16  Yes Biby, Massie Kluver, NP  isosorbide mononitrate (IMDUR) 30 MG 24 hr tablet Take 0.5 tablets (15 mg total) by mouth daily. 04/10/18  Yes Dettinger, Fransisca Kaufmann, MD  levothyroxine (SYNTHROID, LEVOTHROID) 50 MCG tablet TAKE 1 TABLET BY MOUTH ONCE DAILY BEFORE BREAKFAST Patient taking differently: Take 50 mcg by mouth daily before breakfast.  04/10/18  Yes Dettinger, Fransisca Kaufmann, MD  magnesium oxide (MAG-OX) 400 MG tablet Take 1 tablet (400 mg total) by mouth daily. 03/04/16  Yes BranchAlphonse Guild, MD  metFORMIN (GLUCOPHAGE) 1000 MG tablet Take 1 tablet (1,000 mg total) by mouth 2 (  two) times daily with a meal. 08/30/14  Yes Charolette Forward, MD  metoprolol succinate (TOPROL-XL) 25 MG 24 hr tablet Take 0.5 tablets (12.5 mg total) by mouth daily. 04/10/18  Yes Dettinger, Fransisca Kaufmann, MD  Multiple Vitamins-Minerals (ICAPS AREDS 2) CAPS Take 1 capsule by mouth 2 (two) times daily.   Yes [provider]  nitroGLYCERIN (NITROSTAT) 0.4 MG SL tablet Place 1 tablet (0.4 mg total) under the tongue every 5 (five) minutes x 3 doses as needed for chest pain. 08/29/14  Yes Charolette Forward, MD  Omega-3 Fatty Acids (FISH OIL) 1000 MG CAPS Take 2,000-3,000 mg See admin instructions by mouth. Take 2000 mg every morning Take 3000 mg at noon and evening.   Yes [provider]  oxyCODONE-acetaminophen (PERCOCET/ROXICET) 5-325 MG per tablet Take 2 tablets by mouth every 6 (six) hours as needed. Take eight tablets daily as needed  for pain per family and list. For bad  Back pain   Yes [provider]  sertraline (ZOLOFT) 100 MG tablet Take 100 mg by mouth daily.   Yes [provider]  tiotropium (SPIRIVA) 18 MCG inhalation capsule Place 18 mcg into inhaler and inhale at bedtime.    Yes [provider]  vitamin B-12 (CYANOCOBALAMIN) 1000 MCG tablet Take 1,000 mcg by mouth daily.   Yes [provider]  Continuous Blood Gluc Receiver (FREESTYLE LIBRE 14 DAY READER) DEVI 1 each by Does not apply route 4 (four) times daily. 04/10/18   Dettinger, Fransisca Kaufmann, MD  Continuous Blood Gluc Sensor (FREESTYLE LIBRE 14 DAY SENSOR) MISC 1 each by Does not apply route every 14 (fourteen) days. 04/10/18   Dettinger, Fransisca Kaufmann, MD  linaclotide Rolan Lipa) 145 MCG CAPS capsule Take 1 capsule (145 mcg total) by mouth daily before breakfast. Patient not taking: Reported on 12/19/2018 12/01/18   Dettinger, Fransisca Kaufmann, MD    Family History Family History  Problem Relation Age of Onset  . Hypertension Father   . Heart disease Father   . Cancer Mother        uterine  . Diabetes Mother   . Diabetes Brother   . Cancer Brother        lymphoma  . Diabetes Brother     Social History Social History   Tobacco Use  . Smoking status: Former Smoker    Packs/day: 3.00    Years: 45.00    Pack years: 135.00    Types: Cigarettes    Last attempt to quit: 08/31/2007    Years since quitting: 11.3  . Smokeless tobacco: Never Used  Substance Use Topics  . Alcohol use: No  . Drug use: No     Allergies   Amiodarone; Atorvastatin; and Simvastatin   Review of Systems Review of Systems  Constitutional:       Per HPI, otherwise negative  HENT:       Per HPI, otherwise negative  Respiratory:       Per HPI, otherwise negative  Cardiovascular:       Per HPI, otherwise negative  Gastrointestinal: Negative for vomiting.  Endocrine:       Negative aside from HPI  Genitourinary:       Neg aside from HPI    Musculoskeletal:       Per HPI, otherwise negative  Skin: Positive for wound.  Neurological: Positive for weakness and numbness. Negative for syncope.     Physical Exam Updated Vital Signs BP (!) 185/85   Pulse 95   Temp 99.3  F (37.4 C)   Resp 20   Ht 6' (1.829 m)   Wt 131.5 kg   SpO2 100%   BMI 39.33 kg/m   Physical Exam Vitals signs and nursing note reviewed.  Constitutional:      Appearance: He is well-developed. He is ill-appearing.     Comments: Ill-appearing obese elderly male awake and alert, using oxygen via nasal cannula  HENT:     Head: Normocephalic and atraumatic.  Eyes:     Conjunctiva/sclera: Conjunctivae normal.  Cardiovascular:     Rate and Rhythm: Regular rhythm. Tachycardia present.  Pulmonary:     Breath sounds: Decreased air movement present.  Abdominal:     General: There is no distension.  Skin:    General: Skin is warm and dry.       Neurological:     Mental Status: He is alert and oriented to person, place, and time.      ED Treatments / Results  Labs (all labs ordered are listed, but only abnormal results are displayed) Labs Reviewed  COMPREHENSIVE METABOLIC PANEL - Abnormal; Notable for the following components:      Result Value   Chloride 97 (*)    Glucose, Bld 167 (*)    BUN 34 (*)    Creatinine, Ser 1.63 (*)    Albumin 2.7 (*)    GFR calc non Af Amer 43 (*)    GFR calc Af Amer 50 (*)    All other components within normal limits  CBC WITH DIFFERENTIAL/PLATELET - Abnormal; Notable for the following components:   RBC 4.18 (*)    Hemoglobin 11.6 (*)    HCT 38.4 (*)    Neutro Abs 8.0 (*)    Abs Immature Granulocytes 0.09 (*)    All other components within normal limits  BRAIN NATRIURETIC PEPTIDE - Abnormal; Notable for the following components:   B Natriuretic Peptide 570.0 (*)    All other components within normal limits    EKG EKG Interpretation  Date/Time:  Tuesday December 19 2018 14:00:19 EDT Ventricular Rate:  98  PR Interval:    QRS Duration: 118 QT Interval:  370 QTC Calculation: 473 R Axis:   95 Text Interpretation:  Sinus rhythm Nonspecific intraventricular conduction delay Probable inferior infarct, old Baseline wander in lead(s) II III aVF V3 V5 V6 Abnormal ekg Confirmed by Carmin Muskrat [phone]) on 12/19/2018 2:11:25 PM   Radiology Dg Chest 2 View  Result Date: 12/19/2018 CLINICAL DATA:  Shortness of breath and lower extremity edema EXAM: CHEST - 2 VIEW COMPARISON:  July 23, 2017 FINDINGS: There is no appreciable edema or consolidation. There is cardiomegaly with pulmonary vascularity within normal limits. No adenopathy. There is mild degenerative change in thoracic spine. IMPRESSION: Stable cardiac prominence.  No edema or consolidation. Electronically Signed   By: Lowella Grip III M.D.   On: 12/19/2018 15:15   US Venous Img Lower  Left (dvt Study)  Result Date: 12/19/2018 CLINICAL DATA:  Leg swelling for 2 weeks EXAM: LEFT LOWER EXTREMITY VENOUS DUPLEX ULTRASOUND TECHNIQUE: Doppler venous assessment of the left lower extremity deep venous system was performed, including characterization of spectral flow, compressibility, and phasicity. COMPARISON:  None. FINDINGS: There is complete compressibility of the left common femoral, femoral, and popliteal veins. Doppler analysis demonstrates respiratory phasicity and augmentation of flow with calf compression. No obvious superficial vein or calf vein thrombosis. IMPRESSION: No evidence of left lower extremity DVT. Electronically Signed   By: Rodena Goldmann.D.  On: 12/19/2018 14:47   Dg Foot Complete Left  Result Date: 12/19/2018 CLINICAL DATA:  Left foot pain EXAM: LEFT FOOT - COMPLETE 3+ VIEW COMPARISON:  None. FINDINGS: There is no evidence of fracture or dislocation. There is no evidence of arthropathy or other focal bone abnormality. There are enthesopathic changes at the Achilles tendon insertion. There is a small plantar calcaneal spur.  There is soft tissue swelling along the dorsal aspect of the foot. Soft tissue wound along the lateral aspect of the midfoot. IMPRESSION: 1.  No acute osseous injury of the left foot. 2. Soft tissue swelling along the dorsal aspect of the foot which may reflect reactive changes versus cellulitis. Electronically Signed   By: Kathreen Devoid   On: 12/19/2018 16:53    Procedures Procedures (including critical care time)  Medications Ordered in ED Medications  furosemide (LASIX) injection 40 mg (has no administration in time range)  vancomycin (VANCOCIN) IVPB 1000 mg/200 mL premix (1,000 mg Intravenous New Bag/Given 12/19/18 1626)     Initial Impression / Assessment and Plan / ED Course  I have reviewed the triage vital signs and the nursing notes.  Pertinent labs & imaging results that were available during my care of the patient were reviewed by me and considered in my medical decision making (see chart for details).       After the initial evaluation I discussed the patient's presentation with his wife, who notes that the patient is a minimizer. She does not the patient has had substantial decline in functionality over the past week, with new oxygen requirement, swelling, and erythema as listed above.   5:39 PM Patient aware of all findings. With concern for cellulitis, worsening heart failure, worsening renal function, the patient has received antibiotics, as well as IV Lasix. Patient continues to use oxygen via nasal cannula for respiratory support. Patient has no leukocytosis, but has elevated absolute immature granulocyte count suggesting bacterial infection, consistent with physical exam.  X-ray does not demonstrate evidence for osteomyelitis.  However, with cellulitis, as above, worsening heart failure, worsening renal function, after meds, patient was admitted for further monitoring, management.  Final Clinical Impressions(s) / ED Diagnoses  Cellulitis   Carmin Muskrat, MD  12/19/18 1740

## 2018-12-20 ENCOUNTER — Inpatient Hospital Stay (HOSPITAL_COMMUNITY): Payer: Medicare HMO

## 2018-12-20 DIAGNOSIS — F419 Anxiety disorder, unspecified: Secondary | ICD-10-CM | POA: Diagnosis present

## 2018-12-20 DIAGNOSIS — L03116 Cellulitis of left lower limb: Secondary | ICD-10-CM | POA: Diagnosis not present

## 2018-12-20 DIAGNOSIS — E1151 Type 2 diabetes mellitus with diabetic peripheral angiopathy without gangrene: Secondary | ICD-10-CM | POA: Diagnosis not present

## 2018-12-20 DIAGNOSIS — I739 Peripheral vascular disease, unspecified: Secondary | ICD-10-CM | POA: Diagnosis not present

## 2018-12-20 DIAGNOSIS — R600 Localized edema: Secondary | ICD-10-CM | POA: Diagnosis not present

## 2018-12-20 DIAGNOSIS — I5043 Acute on chronic combined systolic (congestive) and diastolic (congestive) heart failure: Secondary | ICD-10-CM | POA: Diagnosis not present

## 2018-12-20 DIAGNOSIS — I130 Hypertensive heart and chronic kidney disease with heart failure and stage 1 through stage 4 chronic kidney disease, or unspecified chronic kidney disease: Secondary | ICD-10-CM | POA: Diagnosis not present

## 2018-12-20 DIAGNOSIS — E1122 Type 2 diabetes mellitus with diabetic chronic kidney disease: Secondary | ICD-10-CM | POA: Diagnosis not present

## 2018-12-20 DIAGNOSIS — I25110 Atherosclerotic heart disease of native coronary artery with unstable angina pectoris: Secondary | ICD-10-CM | POA: Diagnosis not present

## 2018-12-20 DIAGNOSIS — J449 Chronic obstructive pulmonary disease, unspecified: Secondary | ICD-10-CM | POA: Diagnosis not present

## 2018-12-20 DIAGNOSIS — I5032 Chronic diastolic (congestive) heart failure: Secondary | ICD-10-CM | POA: Diagnosis not present

## 2018-12-20 DIAGNOSIS — E039 Hypothyroidism, unspecified: Secondary | ICD-10-CM | POA: Diagnosis not present

## 2018-12-20 DIAGNOSIS — E785 Hyperlipidemia, unspecified: Secondary | ICD-10-CM | POA: Diagnosis present

## 2018-12-20 DIAGNOSIS — Z87891 Personal history of nicotine dependence: Secondary | ICD-10-CM | POA: Diagnosis not present

## 2018-12-20 DIAGNOSIS — Z8673 Personal history of transient ischemic attack (TIA), and cerebral infarction without residual deficits: Secondary | ICD-10-CM | POA: Diagnosis not present

## 2018-12-20 DIAGNOSIS — Z833 Family history of diabetes mellitus: Secondary | ICD-10-CM | POA: Diagnosis not present

## 2018-12-20 DIAGNOSIS — Z9861 Coronary angioplasty status: Secondary | ICD-10-CM | POA: Diagnosis not present

## 2018-12-20 DIAGNOSIS — E1149 Type 2 diabetes mellitus with other diabetic neurological complication: Secondary | ICD-10-CM | POA: Diagnosis not present

## 2018-12-20 DIAGNOSIS — G4733 Obstructive sleep apnea (adult) (pediatric): Secondary | ICD-10-CM | POA: Diagnosis not present

## 2018-12-20 DIAGNOSIS — I252 Old myocardial infarction: Secondary | ICD-10-CM | POA: Diagnosis not present

## 2018-12-20 DIAGNOSIS — J9611 Chronic respiratory failure with hypoxia: Secondary | ICD-10-CM | POA: Diagnosis not present

## 2018-12-20 DIAGNOSIS — G629 Polyneuropathy, unspecified: Secondary | ICD-10-CM | POA: Diagnosis present

## 2018-12-20 DIAGNOSIS — J438 Other emphysema: Secondary | ICD-10-CM | POA: Diagnosis not present

## 2018-12-20 DIAGNOSIS — I480 Paroxysmal atrial fibrillation: Secondary | ICD-10-CM | POA: Diagnosis not present

## 2018-12-20 DIAGNOSIS — Z888 Allergy status to other drugs, medicaments and biological substances status: Secondary | ICD-10-CM | POA: Diagnosis not present

## 2018-12-20 DIAGNOSIS — N183 Chronic kidney disease, stage 3 (moderate): Secondary | ICD-10-CM | POA: Diagnosis not present

## 2018-12-20 DIAGNOSIS — L97509 Non-pressure chronic ulcer of other part of unspecified foot with unspecified severity: Secondary | ICD-10-CM | POA: Diagnosis not present

## 2018-12-20 DIAGNOSIS — Z807 Family history of other malignant neoplasms of lymphoid, hematopoietic and related tissues: Secondary | ICD-10-CM | POA: Diagnosis not present

## 2018-12-20 DIAGNOSIS — Z6839 Body mass index (BMI) 39.0-39.9, adult: Secondary | ICD-10-CM | POA: Diagnosis not present

## 2018-12-20 DIAGNOSIS — N179 Acute kidney failure, unspecified: Secondary | ICD-10-CM | POA: Diagnosis not present

## 2018-12-20 DIAGNOSIS — E1169 Type 2 diabetes mellitus with other specified complication: Secondary | ICD-10-CM | POA: Diagnosis not present

## 2018-12-20 LAB — BASIC METABOLIC PANEL
Anion gap: 10 (ref 5–15)
BUN: 29 mg/dL — ABNORMAL HIGH (ref 8–23)
CO2: 31 mmol/L (ref 22–32)
Calcium: 8.8 mg/dL — ABNORMAL LOW (ref 8.9–10.3)
Chloride: 98 mmol/L (ref 98–111)
Creatinine, Ser: 1.47 mg/dL — ABNORMAL HIGH (ref 0.61–1.24)
GFR calc Af Amer: 56 mL/min — ABNORMAL LOW (ref >60)
GFR calc non Af Amer: 49 mL/min — ABNORMAL LOW (ref >60)
Glucose, Bld: 189 mg/dL — ABNORMAL HIGH (ref 70–99)
Potassium: 4.4 mmol/L (ref 3.5–5.1)
Sodium: 139 mmol/L (ref 135–145)

## 2018-12-20 LAB — GLUCOSE, CAPILLARY
Glucose-Capillary: 169 mg/dL — ABNORMAL HIGH (ref 70–99)
Glucose-Capillary: 219 mg/dL — ABNORMAL HIGH (ref 70–99)
Glucose-Capillary: 192 mg/dL — ABNORMAL HIGH (ref 70–99)
Glucose-Capillary: 174 mg/dL — ABNORMAL HIGH (ref 70–99)

## 2018-12-20 MED ORDER — UMECLIDINIUM BROMIDE 62.5 MCG/INH IN AEPB
1.0000 | INHALATION_SPRAY | Freq: Every day | RESPIRATORY_TRACT | Status: DC
  Administered 2018-12-21: 1 via RESPIRATORY_TRACT
  Filled 2018-12-20: qty 7

## 2018-12-20 NOTE — Plan of Care (Signed)

## 2018-12-20 NOTE — Progress Notes (Signed)
PROGRESS NOTE  Jose [name]  TXM:[id number]  DOB: 09/17/1950  DOA: 12/19/2018 PCP: Dettinger, Fransisca Kaufmann, MD   Brief Admission Hx: 68 year old gentleman with COPD, hypertension, diabetes mellitus type 2, diastolic CHF, coronary artery disease and paroxysmal atrial fibrillation presented with CHF exacerbation and cellulitis of the left lower extremity.  MDM/Assessment & Plan:   1. Presumed cellulitis of the left lower extremity- patient has been evaluated for DVT and the testing has been negative.  ABI studies are pending.  He is on IV antibiotics.  Further recommendations pending studies. 2. Neuropathic ulcer left lateral foot- some surrounding erythema and infection noted.  Topical treatment noted.  Patient was seen by wound care nurse.  He will need outpatient podiatry follow-up. 3. Acute diastolic congestive heart failure- progressive lower extremity edema and hypoxia noted on admission.  Elevated BNP.  He was given IV Lasix and now has been resumed on home oral Lasix 40 mg twice daily.  Follow intake and output. 4. AKI on CKD stage III- creatinine has slightly improved to 1.47 with diuresis.  Monitoring. 5. Type 2 diabetes mellitus with neurological complications- he is currently on Lantus and supplemental sliding scale insulin.  Holding home oral medications.  Hemoglobin A1c pending. 6. Essential hypertension- he has been resumed on home metoprolol and Imdur.  DVT prophylaxis: Apixaban Code Status: Full Family Communication: Patient updated at bedside Disposition Plan: Inpatient  Consultants:    Procedures:    Antimicrobials:  Vancomycin 4/21 >  Ceftriaxone 4/21-4/22   Subjective: Patient is awake and alert in no apparent distress.  He denies chest pain and shortness of breath.  He denies fever and chills.  Objective: Vitals:   12/19/18 2159 12/19/18 2330 12/20/18 0603 12/20/18 0615  BP:   (!) 191/85   Pulse:  91 87   Resp:  18    Temp:   98.8 F (37.1 C)    TempSrc:   Oral   SpO2: 98% 98% 94%   Weight:    130.3 kg  Height:        Intake/Output Summary (Last 24 hours) at 12/20/2018 1154 Last data filed at 12/20/2018 0800 Gross per 24 hour  Intake 500 ml  Output 1100 ml  Net -600 ml   Filed Weights   12/19/18 1335 12/19/18 1853 12/20/18 0615  Weight: 131.5 kg 132.2 kg 130.3 kg     REVIEW OF SYSTEMS  As per history otherwise all reviewed and reported negative  Exam:  General exam: Awake, alert, no apparent distress, cooperative. Respiratory system: Clear.  No crackles heard.  No increased work of breathing. Cardiovascular system: S1 & S2 heard.  2+ pedal edema. Gastrointestinal system: Abdomen is nondistended, soft and nontender. Normal bowel sounds heard. Central nervous system: Alert and oriented. No focal neurological deficits. Extremities: Large left lateral neuropathic ulcer, erythema of the left lower extremity, tenderness, no drainage, erythema spreading proximally from foot, onychomycosis, weak pedal pulses, warm extremity.  Data Reviewed: Basic Metabolic Panel: Recent Labs  Lab 12/19/18 1415 12/20/18 0506  NA 137 139  K 4.2 4.4  CL 97* 98  CO2 29 31  GLUCOSE 167* 189*  BUN 34* 29*  CREATININE 1.63* 1.47*  CALCIUM 8.9 8.8*   Liver Function Tests: Recent Labs  Lab 12/19/18 1415  AST 16  ALT 19  ALKPHOS 55  BILITOT 0.7  PROT 7.5  ALBUMIN 2.7*   No results for input(s): LIPASE, AMYLASE in the last 168 hours. No results for input(s): AMMONIA in the last 168 hours.  CBC: Recent Labs  Lab 12/19/18 1415  WBC 10.2  NEUTROABS 8.0*  HGB 11.6*  HCT 38.4*  MCV 91.9  PLT 312   Cardiac Enzymes: No results for input(s): CKTOTAL, CKMB, CKMBINDEX, TROPONINI in the last 168 hours. CBG (last 3)  Recent Labs    12/19/18 2112 12/20/18 0724 12/20/18 1124  GLUCAP 191* 169* 219*   No results found for this or any previous visit (from the past 240 hour(s)).   Studies: Dg Chest 2 View  Result Date:  12/19/2018 CLINICAL DATA:  Shortness of breath and lower extremity edema EXAM: CHEST - 2 VIEW COMPARISON:  July 23, 2017 FINDINGS: There is no appreciable edema or consolidation. There is cardiomegaly with pulmonary vascularity within normal limits. No adenopathy. There is mild degenerative change in thoracic spine. IMPRESSION: Stable cardiac prominence.  No edema or consolidation. Electronically Signed   By: Lowella Grip III M.D.   On: 12/19/2018 15:15   US Venous Img Lower  Left (dvt Study)  Result Date: 12/19/2018 CLINICAL DATA:  Leg swelling for 2 weeks EXAM: LEFT LOWER EXTREMITY VENOUS DUPLEX ULTRASOUND TECHNIQUE: Doppler venous assessment of the left lower extremity deep venous system was performed, including characterization of spectral flow, compressibility, and phasicity. COMPARISON:  None. FINDINGS: There is complete compressibility of the left common femoral, femoral, and popliteal veins. Doppler analysis demonstrates respiratory phasicity and augmentation of flow with calf compression. No obvious superficial vein or calf vein thrombosis. IMPRESSION: No evidence of left lower extremity DVT. Electronically Signed   By: Marybelle Killings M.D.   On: 12/19/2018 14:47   Dg Foot Complete Left  Result Date: 12/19/2018 CLINICAL DATA:  Left foot pain EXAM: LEFT FOOT - COMPLETE 3+ VIEW COMPARISON:  None. FINDINGS: There is no evidence of fracture or dislocation. There is no evidence of arthropathy or other focal bone abnormality. There are enthesopathic changes at the Achilles tendon insertion. There is a small plantar calcaneal spur. There is soft tissue swelling along the dorsal aspect of the foot. Soft tissue wound along the lateral aspect of the midfoot. IMPRESSION: 1.  No acute osseous injury of the left foot. 2. Soft tissue swelling along the dorsal aspect of the foot which may reflect reactive changes versus cellulitis. Electronically Signed   By: Kathreen Devoid   On: 12/19/2018 16:53   Scheduled  Meds:  apixaban  5 mg Oral BID   clopidogrel  75 mg Oral Daily   ezetimibe  10 mg Oral Daily   furosemide  40 mg Oral BID   insulin aspart  0-9 Units Subcutaneous TID WC   insulin glargine  55 Units Subcutaneous Daily   And   insulin glargine  60 Units Subcutaneous QHS   isosorbide mononitrate  15 mg Oral Daily   levothyroxine  50 mcg Oral Q0600   metoprolol succinate  12.5 mg Oral Daily   sertraline  100 mg Oral Daily   umeclidinium bromide  1 puff Inhalation QHS   Continuous Infusions:  cefTRIAXone (ROCEPHIN)  IV 2 g (12/19/18 2042)   vancomycin 1,000 mg (12/20/18 0815)   Active Problems:   Cellulitis  Time spent:   Irwin Brakeman, MD [facility] 12/20/2018, 11:54 AM    LOS: 0 days  How to contact the [facility] Attending or Consulting provider [place] or covering provider during after hours [place], for this patient?  1. Check the care team in [facility] and look for a) attending/consulting [facility] provider listed and b) the [facility] team listed 2.  Log into [email or web address] and use [place]'s universal password to access. If you do not have the password, please contact the hospital operator. 3. Locate the [facility] provider you are looking for under [facility] and page to a number that you can be directly reached. 4. If you still have difficulty reaching the provider, please page the [facility] (Director on Call) for the Hospitalists listed on amion for assistance.

## 2018-12-20 NOTE — Progress Notes (Signed)
Pharmacy Antibiotic Note  Jose [name] is a 68 y.o. male admitted on 12/19/2018 with cellulitis.  Pharmacy has been consulted for vancomycin dosing.  Plan: Vancomycin trough 4/23@0830  Vancomycin 1000mg  IV every 12 hours.  Goal trough 15-20 mcg/mL.  Height: 6' (182.9 cm) Weight: 287 lb 4.2 oz (130.3 kg) IBW/kg (Calculated) : 77.6  Temp (24hrs), Avg:98.7 F (37.1 C), Min:98.1 F (36.7 C), Max:99.3 F (37.4 C)  Recent Labs  Lab 12/19/18 1415 12/20/18 0506  WBC 10.2  --   CREATININE 1.63* 1.47*    Estimated Creatinine Clearance: 68.1 mL/min (A) (by C-G formula based on SCr of 1.47 mg/dL (H)).    Allergies  Allergen Reactions  . Amiodarone     Weakness during 2018 admission  . Atorvastatin     Tremors  . Simvastatin     Muscle weakness    Antimicrobials this admission: 4/21 vancomycin >>  4/21 ceftriaxone x 1   Microbiology results: None  Thank you for allowing pharmacy to be a part of this patient's care.  Donna Christen Haim Hansson 12/20/2018 12:05 PM

## 2018-12-20 NOTE — Consult Note (Signed)
[place] Nurse wound consult note Patient receiving care in [facility].  I spoke with his primary RN, Hinton Dyer, and reviewed the record, including photos, of the left leg and foot. Reason for Consult:"wound left lower extremity" Wound type:the left lateral foot wound is a diabetic neuropathic foot ulcer.  The left lower leg is discolored, and has edema. Measurement: To be provided by the primary RN, Hinton Dyer Wound bed: See photo Drainage (amount, consistency, odor) Purulent Periwound: intact Dressing procedure/placement/frequency:Cleanse left foot wound with saline. Apply Xeroform gauze Kellie Simmering [phone]), cover with dry gauze, secure with kerlex.  Change daily.  Keep the extremity elevated.  ADDITIONAL RECOMMENDATIONS: 1.  Order a podiatry consult.  2.  Evaluate blood flow to the left leg and foot (eval for clots and obtain ABI). 3.  Consider a referral to Vascular Specialist  Monitor the wound area(s) for worsening of condition such as: Signs/symptoms of infection,  Increase in size,  Development of or worsening of odor, Development of pain, or increased pain at the affected locations.  Notify the medical team if any of these develop.  Thank you for the consult.  Discussed plan of care with the patient and bedside nurse.  [place] nurse will not follow at this time.  Please re-consult the [place] team if needed.  Val Riles, RN, MSN, CWOCN, CNS-BC, pager [phone]

## 2018-12-21 DIAGNOSIS — I252 Old myocardial infarction: Secondary | ICD-10-CM

## 2018-12-21 DIAGNOSIS — I480 Paroxysmal atrial fibrillation: Secondary | ICD-10-CM

## 2018-12-21 DIAGNOSIS — E1159 Type 2 diabetes mellitus with other circulatory complications: Secondary | ICD-10-CM

## 2018-12-21 DIAGNOSIS — J438 Other emphysema: Secondary | ICD-10-CM

## 2018-12-21 DIAGNOSIS — I5032 Chronic diastolic (congestive) heart failure: Secondary | ICD-10-CM

## 2018-12-21 DIAGNOSIS — E785 Hyperlipidemia, unspecified: Secondary | ICD-10-CM

## 2018-12-21 DIAGNOSIS — E039 Hypothyroidism, unspecified: Secondary | ICD-10-CM

## 2018-12-21 DIAGNOSIS — I739 Peripheral vascular disease, unspecified: Secondary | ICD-10-CM

## 2018-12-21 DIAGNOSIS — Z794 Long term (current) use of insulin: Secondary | ICD-10-CM

## 2018-12-21 DIAGNOSIS — I25110 Atherosclerotic heart disease of native coronary artery with unstable angina pectoris: Secondary | ICD-10-CM

## 2018-12-21 DIAGNOSIS — G4733 Obstructive sleep apnea (adult) (pediatric): Secondary | ICD-10-CM

## 2018-12-21 DIAGNOSIS — E1169 Type 2 diabetes mellitus with other specified complication: Secondary | ICD-10-CM

## 2018-12-21 LAB — COMPREHENSIVE METABOLIC PANEL
ALT: 16 U/L (ref 0–44)
AST: 19 U/L (ref 15–41)
Albumin: 2.5 g/dL — ABNORMAL LOW (ref 3.5–5.0)
Alkaline Phosphatase: 50 U/L (ref 38–126)
Anion gap: 9 (ref 5–15)
BUN: 23 mg/dL (ref 8–23)
CO2: 30 mmol/L (ref 22–32)
Calcium: 8.5 mg/dL — ABNORMAL LOW (ref 8.9–10.3)
Chloride: 97 mmol/L — ABNORMAL LOW (ref 98–111)
Creatinine, Ser: 1.33 mg/dL — ABNORMAL HIGH (ref 0.61–1.24)
GFR calc Af Amer: >60 mL/min (ref >60)
GFR calc non Af Amer: 55 mL/min — ABNORMAL LOW (ref >60)
Glucose, Bld: 150 mg/dL — ABNORMAL HIGH (ref 70–99)
Potassium: 3.7 mmol/L (ref 3.5–5.1)
Sodium: 136 mmol/L (ref 135–145)
Total Bilirubin: 0.5 mg/dL (ref 0.3–1.2)
Total Protein: 6.5 g/dL (ref 6.5–8.1)

## 2018-12-21 LAB — CBC
HCT: 35.5 % — ABNORMAL LOW (ref 39.0–52.0)
Hemoglobin: 10.9 g/dL — ABNORMAL LOW (ref 13.0–17.0)
MCH: 27.9 pg (ref 26.0–34.0)
MCHC: 30.7 g/dL (ref 30.0–36.0)
MCV: 91 fL (ref 80.0–100.0)
Platelets: 277 10*3/uL (ref 150–400)
RBC: 3.9 MIL/uL — ABNORMAL LOW (ref 4.22–5.81)
RDW: 14.1 % (ref 11.5–15.5)
WBC: 8.3 10*3/uL (ref 4.0–10.5)
nRBC: 0 % (ref 0.0–0.2)

## 2018-12-21 LAB — HEMOGLOBIN A1C
Hgb A1c MFr Bld: 7 % — ABNORMAL HIGH (ref 4.8–5.6)
Mean Plasma Glucose: 154.2 mg/dL

## 2018-12-21 LAB — GLUCOSE, CAPILLARY
Glucose-Capillary: 95 mg/dL (ref 70–99)
Glucose-Capillary: 144 mg/dL — ABNORMAL HIGH (ref 70–99)
Glucose-Capillary: 116 mg/dL — ABNORMAL HIGH (ref 70–99)
Glucose-Capillary: 223 mg/dL — ABNORMAL HIGH (ref 70–99)

## 2018-12-21 LAB — VANCOMYCIN, TROUGH: Vancomycin Tr: 36 ug/mL (ref 15–20)

## 2018-12-21 LAB — HIV ANTIBODY (ROUTINE TESTING W REFLEX): HIV Screen 4th Generation wRfx: NONREACTIVE

## 2018-12-21 MED ORDER — HYDRALAZINE HCL 20 MG/ML IJ SOLN: 10.0000 mg | INTRAMUSCULAR | Status: DC | PRN

## 2018-12-21 MED ORDER — METOPROLOL SUCCINATE ER 25 MG PO TB24
25.0000 mg | ORAL_TABLET | Freq: Every day | ORAL | Status: DC
  Administered 2018-12-21 – 2018-12-22 (×2): 25 mg via ORAL
  Filled 2018-12-21: qty 1

## 2018-12-21 MED ORDER — INSULIN GLARGINE 100 UNIT/ML ~~LOC~~ SOLN
50.0000 [IU] | Freq: Every day | SUBCUTANEOUS | Status: DC
  Administered 2018-12-21: 50 [IU] via SUBCUTANEOUS
  Filled 2018-12-21 (×4): qty 0.5

## 2018-12-21 MED ORDER — INSULIN GLARGINE 100 UNIT/ML ~~LOC~~ SOLN
50.0000 [IU] | Freq: Every day | SUBCUTANEOUS | Status: DC
  Administered 2018-12-21 – 2018-12-22 (×2): 50 [IU] via SUBCUTANEOUS
  Filled 2018-12-21 (×5): qty 0.5

## 2018-12-21 MED ORDER — ISOSORBIDE MONONITRATE ER 60 MG PO TB24
30.0000 mg | ORAL_TABLET | Freq: Every day | ORAL | Status: DC
  Administered 2018-12-21 – 2018-12-22 (×2): 30 mg via ORAL
  Filled 2018-12-21: qty 1

## 2018-12-21 NOTE — Plan of Care (Signed)
  Problem: Acute Rehab PT Goals(only PT should resolve) Goal: Pt Will Go Supine/Side To Sit Outcome: Progressing Flowsheets (Taken 12/21/2018 1551) Pt will go Supine/Side to Sit: Independently Goal: Patient Will Transfer Sit To/From Stand Outcome: Progressing Flowsheets (Taken 12/21/2018 1551) Patient will transfer sit to/from stand: with modified independence Goal: Pt Will Transfer Bed To Chair/Chair To Bed Outcome: Progressing Flowsheets (Taken 12/21/2018 1551) Pt will Transfer Bed to Chair/Chair to Bed: with modified independence Goal: Pt Will Ambulate Outcome: Progressing Flowsheets (Taken 12/21/2018 1551) Pt will Ambulate: 50 feet; with supervision; with rolling walker   3:52 PM, 12/21/18 Lonell Grandchild, MPT Physical Therapist with [facility] [phone] office [phone] mobile phone

## 2018-12-21 NOTE — Progress Notes (Signed)
Patient does not want to wear CPAP tonight. Unit at bedside. Patient instructed to call if he changes his mind.

## 2018-12-21 NOTE — Plan of Care (Signed)

## 2018-12-21 NOTE — Progress Notes (Signed)
CRITICAL VALUE ALERT  Critical Value:  vanc trough 36  Date & Time Notied:  12/21/18 1144  Provider Notified: Dr Wynetta Emery  Orders Received/Actions taken: none

## 2018-12-21 NOTE — Evaluation (Signed)
Physical Therapy Evaluation Patient Details Name: Jose [name] MRN: [id number] DOB: 1950-10-30 Today's Date: 12/21/2018   History of Present Illness  Jose [name] is a 68 y.o. male with medical history significant for COPD, hypertension, morbid obesity, OSA, DM 2, diastolic CHF, CAD, paroxysmal atrial fibrillation, hypothyroidism, peripheral artery disease, left diabetic foot ulcer, who presented to the ED with complaints of bilateral leg swelling, left greater than right.  Patient reports over the past week his left leg started swelling worse than right with associated redness.  He has neuropathy so he is unable to tell if he has been having pain.  Denies fever or chills.    Clinical Impression  Patient functioning near baseline for functional mobility and gait, limited for ambulation mostly due to c/o fatigue, instructed in TTWB on left foot to protect left foot plantar surface wound with good return demonstrated and tolerated sitting up in chair after therapy.  Patient will benefit from continued physical therapy in hospital and recommended venue below to increase strength, balance, endurance for safe ADLs and gait.     Follow Up Recommendations Home health PT;Supervision - Intermittent    Equipment Recommendations  None recommended by PT    Recommendations for Other Services       Precautions / Restrictions Precautions Precautions: Fall Restrictions Weight Bearing Restrictions: No      Mobility  Bed Mobility Overal bed mobility: Modified Independent                Transfers Overall transfer level: Needs assistance Equipment used: Rolling walker (2 wheeled) Transfers: Sit to/from [employer] Transfers Sit to Stand: Supervision Stand pivot transfers: Supervision       General transfer comment: slightly labored movement  Ambulation/Gait Ambulation/Gait assistance: Min guard;Supervision Gait Distance (Feet): 20 Feet Assistive device: Rolling walker (2  wheeled) Gait Pattern/deviations: Decreased step length - right;Decreased step length - left;Decreased stance time - left;Decreased stride length Gait velocity: decreased   General Gait Details: limited to ambulating to doorway and back to bedside with slightly labored cadence with mostly TTWB on left foot due to wound, limited secondary to c/o fatigue  Stairs            Wheelchair Mobility    Modified Rankin (Stroke Patients Only)       Balance Overall balance assessment: Needs assistance Sitting-balance support: Feet supported;No upper extremity supported Sitting balance-Leahy Scale: Good     Standing balance support: Bilateral upper extremity supported;During functional activity Standing balance-Leahy Scale: Fair Standing balance comment: using RW                             Pertinent Vitals/Pain Pain Assessment: 0-10 Pain Score: 5  Pain Location: low back Pain Descriptors / Indicators: Aching Pain Intervention(s): Limited activity within patient's tolerance;Monitored during session    Home Living Family/patient expects to be discharged to:: Private residence Living Arrangements: Spouse/significant other Available Help at Discharge: Family Type of Home: House Home Access: Level entry     Home Layout: One level Home Equipment: Environmental consultant - 2 wheels;Cane - single point;Wheelchair - manual      Prior Function Level of Independence: Independent with assistive device(s)         Comments: household ambulator with RW     Hand Dominance   Dominant Hand: Right    Extremity/Trunk Assessment   Upper Extremity Assessment Upper Extremity Assessment: Overall WFL for tasks assessed    Lower Extremity Assessment  Lower Extremity Assessment: Generalized weakness;RLE deficits/detail;LLE deficits/detail RLE Deficits / Details: grossly 5/5 RLE Sensation: decreased proprioception LLE Deficits / Details: grossly -4/5, hx of neuropathy in feet LLE  Sensation: decreased proprioception    Cervical / Trunk Assessment Cervical / Trunk Assessment: Normal  Communication   Communication: No difficulties  Cognition Arousal/Alertness: Awake/alert Behavior During Therapy: WFL for tasks assessed/performed Overall Cognitive Status: Within Functional Limits for tasks assessed                                        General Comments      Exercises     Assessment/Plan    PT Assessment Patient needs continued PT services  PT Problem List Decreased strength;Decreased activity tolerance;Decreased balance;Decreased mobility       PT Treatment Interventions Therapeutic exercise;Gait training;Stair training;Functional mobility training;Therapeutic activities;Patient/family education    PT Goals (Current goals can be found in the Care Plan section)  Acute Rehab PT Goals Patient Stated Goal: return home with spouse to assist PT Goal Formulation: With patient Time For Goal Achievement: 12/28/18 Potential to Achieve Goals: Good    Frequency Min 3X/week   Barriers to discharge        Co-evaluation               AM-PAC PT "6 Clicks" Mobility  Outcome Measure Help needed turning from your back to your side while in a flat bed without using bedrails?: None Help needed moving from lying on your back to sitting on the side of a flat bed without using bedrails?: None Help needed moving to and from a bed to a chair (including a wheelchair)?: A Little Help needed standing up from a chair using your arms (e.g., wheelchair or bedside chair)?: None Help needed to walk in hospital room?: A Little Help needed climbing 3-5 steps with a railing? : A Lot 6 Click Score: 20    End of Session   Activity Tolerance: Patient tolerated treatment well;Patient limited by fatigue Patient left: in chair;with call bell/phone within reach Nurse Communication: Mobility status PT Visit Diagnosis: Unsteadiness on feet (R26.81);Muscle  weakness (generalized) (M62.81);Other abnormalities of gait and mobility (R26.89)    Time: 1510-1536 PT Time Calculation (min) (ACUTE ONLY): 26 min   Charges:   PT Evaluation $PT Eval Moderate Complexity: 1 Mod PT Treatments $Therapeutic Activity: 23-37 mins        3:50 PM, 12/21/18 Lonell Grandchild, MPT Physical Therapist with [facility] [phone] office [phone] mobile phone

## 2018-12-21 NOTE — TOC Transition Note (Signed)
Transition of Care [facility]) - CM/SW Discharge Note   Patient Details  Name: YONIS CARREON MRN: [id number] Date of Birth: December 16, 1950  [facility] CM/SW Contact:  Safal Halderman, Chauncey Reading, RN Phone Number: 12/21/2018, 3:49 PM   Clinical Narrative:  Cellulitis of the left lower extremity.  From home with wife, walks with RW. Seen by PT, home health PT recommended. Discussed with patient, he asks that CM call wife. Call to wife, she is agreeable to home health. Patient has had [facility] in the past, elects again. Referral given to [facility].  Patient does not have oxygen at home currently, has in the past.     Final next level of care: Home w Home Health Services Barriers to Discharge: No Barriers Identified   Patient Goals and CMS Choice Patient states their goals for this hospitalization and ongoing recovery are:: get home CMS Medicare.gov Compare Post Acute Care list provided to:: Other (Comment Required)(patient defers to wife, asks CM to speak with with) Choice offered to / list presented to : Patient, Spouse       Discharge Plan and Services   Discharge Planning Services: CM Consult Post Acute Care Choice: [place]: PT [place]: [place] ([place]) Date [place]: 12/21/18 Time [place]: [place] Representative spoke with at [place]: Vaughan Basta of [facility]   Readmission Risk Interventions No flowsheet data found.

## 2018-12-21 NOTE — Progress Notes (Signed)
PROGRESS NOTE    Jose [name]  XVQ:[id number]  DOB: 04/05/1951  DOA: 12/19/2018 PCP: Dettinger, Fransisca Kaufmann, MD   Brief Admission Hx: 69 year old gentleman with COPD, hypertension, type 2 diabetes, diastolic CHF, atrial fibrillation, coronary artery disease presented with mild CHF exacerbation and cellulitis of the left lower extremity.  MDM/Assessment & Plan:   1. Cellulitis of the left lower extremity-patient was negative for DVT in the area and had ABI testing with an ABI of 0.6 which suggests moderate peripheral artery disease.  He is responding to IV antibiotic therapy.  His symptoms are improving.  He is hoping that he can discharge home tomorrow. 2. Neuropathic ulcer left lateral foot- patient has been seen by the wound care nurse and topical treatment noted.  He will need outpatient follow-up with the wound care center. 3. Moderate peripheral artery disease left lower extremity-the patient is followed by Dr. Trula Slade with vascular surgery and I spoke with his wife about getting him an appointment to follow-up. 4. Acute on chronic diastolic heart failure- the patient has diuresed 1.4 L and is feeling better.  His renal function is holding stable.  Continue current treatments. 5. Acute on chronic kidney failure stage III-creatinine is stable to improved with IV diuresis. 6. Type 2 diabetes mellitus with neurological complications- holding home oral medications.  Continue Lantus and supplemental sliding scale coverage.  A1c pending. 7. Essential hypertension-blood pressure is poorly controlled and we have titrated up his medications to try and get better blood pressure control.  DVT prophylaxis: Apixaban Code Status: Full Family Communication: Telephone update betty Disposition Plan: SNF   Consultants:    Procedures:    Antimicrobials:   vancomycin 4/21 >  Ceftriaxone 4/21-4/22   Subjective: Pt says that he is feeling much better.  His leg is less painful and less  swollen. He has been ambulating on it.    Objective: Vitals:   12/20/18 1926 12/20/18 2144 12/20/18 2258 12/21/18 0523  BP:  (!) 199/85 (!) 188/87 (!) 180/87  Pulse:  87  84  Resp:      Temp:  98.1 F (36.7 C)  98.8 F (37.1 C)  TempSrc:  Oral  Oral  SpO2: 94% 96%  90%  Weight:    127.6 kg  Height:        Intake/Output Summary (Last 24 hours) at 12/21/2018 1320 Last data filed at 12/21/2018 1100 Gross per 24 hour  Intake 240 ml  Output 1600 ml  Net -1360 ml   Filed Weights   12/19/18 1853 12/20/18 0615 12/21/18 0523  Weight: 132.2 kg 130.3 kg 127.6 kg     REVIEW OF SYSTEMS  As per history otherwise all reviewed and reported negative  Exam:  General exam: awake, alert, NAD, cooperative.   Respiratory system: Clear. No increased work of breathing. Cardiovascular system: normal S1 & S2 heard. 1+ pedal edema bilateral. Gastrointestinal system: Abdomen is nondistended, soft and nontender. Normal bowel sounds heard. Central nervous system: Alert and oriented. No focal neurological deficits. Extremities: warm extremities bilateral LEs. Less redness and tenderness of left leg cellulitis.  Wounds clean and dry.   Data Reviewed: Basic Metabolic Panel: Recent Labs  Lab 12/19/18 1415 12/20/18 0506 12/21/18 1047  NA 137 139 136  K 4.2 4.4 3.7  CL 97* 98 97*  CO2 29 31 30   GLUCOSE 167* 189* 150*  BUN 34* 29* 23  CREATININE 1.63* 1.47* 1.33*  CALCIUM 8.9 8.8* 8.5*   Liver Function Tests: Recent Labs  Lab 12/19/18  1415 12/21/18 1047  AST 16 19  ALT 19 16  ALKPHOS 55 50  BILITOT 0.7 0.5  PROT 7.5 6.5  ALBUMIN 2.7* 2.5*   No results for input(s): LIPASE, AMYLASE in the last 168 hours. No results for input(s): AMMONIA in the last 168 hours. CBC: Recent Labs  Lab 12/19/18 1415 12/21/18 1047  WBC 10.2 8.3  NEUTROABS 8.0*  --   HGB 11.6* 10.9*  HCT 38.4* 35.5*  MCV 91.9 91.0  PLT 312 277   Cardiac Enzymes: No results for input(s): CKTOTAL, CKMB,  CKMBINDEX, TROPONINI in the last 168 hours. CBG (last 3)  Recent Labs    12/20/18 2139 12/21/18 0743 12/21/18 1100  GLUCAP 174* 95 144*   No results found for this or any previous visit (from the past 240 hour(s)).   Studies: Dg Chest 2 View  Result Date: 12/19/2018 CLINICAL DATA:  Shortness of breath and lower extremity edema EXAM: CHEST - 2 VIEW COMPARISON:  July 23, 2017 FINDINGS: There is no appreciable edema or consolidation. There is cardiomegaly with pulmonary vascularity within normal limits. No adenopathy. There is mild degenerative change in thoracic spine. IMPRESSION: Stable cardiac prominence.  No edema or consolidation. Electronically Signed   By: Lowella Grip III M.D.   On: 12/19/2018 15:15   US Venous Img Lower  Left (dvt Study)  Result Date: 12/19/2018 CLINICAL DATA:  Leg swelling for 2 weeks EXAM: LEFT LOWER EXTREMITY VENOUS DUPLEX ULTRASOUND TECHNIQUE: Doppler venous assessment of the left lower extremity deep venous system was performed, including characterization of spectral flow, compressibility, and phasicity. COMPARISON:  None. FINDINGS: There is complete compressibility of the left common femoral, femoral, and popliteal veins. Doppler analysis demonstrates respiratory phasicity and augmentation of flow with calf compression. No obvious superficial vein or calf vein thrombosis. IMPRESSION: No evidence of left lower extremity DVT. Electronically Signed   By: Marybelle Killings M.D.   On: 12/19/2018 14:47   US Arterial Abi (screening Lower Extremity)  Result Date: 12/20/2018 CLINICAL DATA:  Left leg edema.  History of diabetic foot ulcer. EXAM: NONINVASIVE PHYSIOLOGIC VASCULAR STUDY OF BILATERAL LOWER EXTREMITIES TECHNIQUE: Evaluation of both lower extremities were performed at rest, including calculation of ankle-brachial indices with single level Doppler, pressure and pulse volume recording. COMPARISON:  None. FINDINGS: Right ABI:  Could not be obtained due to  incompressible vessels. Left ABI:  0.69 Right Lower Extremity: Triphasic waveforms in the right dorsalis pedis artery. Irregular waveforms in the right posterior tibial artery. Left Lower Extremity:  Irregular waveforms in the left ankle. 0.5-0.79 Moderate PAD IMPRESSION: 1. Right ankle-brachial index could not be obtained due to incompressible vessels. 2. Left ankle-brachial index is 0.69 and suggests moderate peripheral arterial disease. Electronically Signed   By: Markus Daft M.D.   On: 12/20/2018 13:50   Dg Foot Complete Left  Result Date: 12/19/2018 CLINICAL DATA:  Left foot pain EXAM: LEFT FOOT - COMPLETE 3+ VIEW COMPARISON:  None. FINDINGS: There is no evidence of fracture or dislocation. There is no evidence of arthropathy or other focal bone abnormality. There are enthesopathic changes at the Achilles tendon insertion. There is a small plantar calcaneal spur. There is soft tissue swelling along the dorsal aspect of the foot. Soft tissue wound along the lateral aspect of the midfoot. IMPRESSION: 1.  No acute osseous injury of the left foot. 2. Soft tissue swelling along the dorsal aspect of the foot which may reflect reactive changes versus cellulitis. Electronically Signed   By: Kathreen Devoid  On: 12/19/2018 16:53   Scheduled Meds: . apixaban  5 mg Oral BID  . clopidogrel  75 mg Oral Daily  . ezetimibe  10 mg Oral Daily  . furosemide  40 mg Oral BID  . insulin aspart  0-9 Units Subcutaneous TID WC  . insulin glargine  50 Units Subcutaneous Daily   And  . insulin glargine  50 Units Subcutaneous QHS  . isosorbide mononitrate  30 mg Oral Daily  . levothyroxine  50 mcg Oral Q0600  . metoprolol succinate  25 mg Oral Daily  . sertraline  100 mg Oral Daily  . umeclidinium bromide  1 puff Inhalation Q2000   Continuous Infusions: . vancomycin 1,000 mg (12/21/18 1000)    Principal Problem:   Cellulitis of left lower extremity Active Problems:   COPD (chronic obstructive pulmonary disease)  (HCC)   Hypertension associated with diabetes (HCC)   OSA (obstructive sleep apnea)   Hyperlipidemia associated with type 2 diabetes mellitus (HCC)   Type 2 diabetes mellitus, controlled (HCC)   Chronic diastolic CHF (congestive heart failure) (HCC)   Coronary artery disease involving native coronary artery of native heart with unstable angina pectoris (HCC)   PAF (paroxysmal atrial fibrillation) (HCC)   CRI (chronic renal insufficiency), stage 3 (moderate) ([place])   History of ST elevation myocardial infarction (STEMI)   Carotid artery disease ([place])   Hypothyroidism   Peripheral arterial disease ([place])  Time spent:   Irwin Brakeman, MD [facility] 12/21/2018, 1:20 PM    LOS: 1 day  How to contact the [facility] Attending or Consulting provider [place] or covering provider during after hours 7P -7A, for this patient?  1. Check the care team in [facility] and look for a) attending/consulting TRH provider listed and b) the [facility] team listed 2. Log into [email or web address] and use [place]'s universal password to access. If you do not have the password, please contact the hospital operator. 3. Locate the [facility] provider you are looking for under [facility] and page to a number that you can be directly reached. 4. If you still have difficulty reaching the provider, please page the [facility] (Director on Call) for the Hospitalists listed on amion for assistance.

## 2018-12-22 DIAGNOSIS — N183 Chronic kidney disease, stage 3 (moderate): Secondary | ICD-10-CM

## 2018-12-22 LAB — RENAL FUNCTION PANEL
Albumin: 2.6 g/dL — ABNORMAL LOW (ref 3.5–5.0)
Anion gap: 8 (ref 5–15)
BUN: 21 mg/dL (ref 8–23)
CO2: 33 mmol/L — ABNORMAL HIGH (ref 22–32)
Calcium: 8.6 mg/dL — ABNORMAL LOW (ref 8.9–10.3)
Chloride: 97 mmol/L — ABNORMAL LOW (ref 98–111)
Creatinine, Ser: 1.45 mg/dL — ABNORMAL HIGH (ref 0.61–1.24)
GFR calc Af Amer: 57 mL/min — ABNORMAL LOW (ref >60)
GFR calc non Af Amer: 49 mL/min — ABNORMAL LOW (ref >60)
Glucose, Bld: 93 mg/dL (ref 70–99)
Phosphorus: 3.8 mg/dL (ref 2.5–4.6)
Potassium: 3.9 mmol/L (ref 3.5–5.1)
Sodium: 138 mmol/L (ref 135–145)

## 2018-12-22 LAB — CBC WITH DIFFERENTIAL/PLATELET
Abs Immature Granulocytes: 0.07 10*3/uL (ref 0.00–0.07)
Basophils Absolute: 0 10*3/uL (ref 0.0–0.1)
Basophils Relative: 1 %
Eosinophils Absolute: 0.4 10*3/uL (ref 0.0–0.5)
Eosinophils Relative: 5 %
HCT: 36.7 % — ABNORMAL LOW (ref 39.0–52.0)
Hemoglobin: 11.2 g/dL — ABNORMAL LOW (ref 13.0–17.0)
Immature Granulocytes: 1 %
Lymphocytes Relative: 17 %
Lymphs Abs: 1.2 10*3/uL (ref 0.7–4.0)
MCH: 27.9 pg (ref 26.0–34.0)
MCHC: 30.5 g/dL (ref 30.0–36.0)
MCV: 91.3 fL (ref 80.0–100.0)
Monocytes Absolute: 0.6 10*3/uL (ref 0.1–1.0)
Monocytes Relative: 8 %
Neutro Abs: 5.1 10*3/uL (ref 1.7–7.7)
Neutrophils Relative %: 68 %
Platelets: 329 10*3/uL (ref 150–400)
RBC: 4.02 MIL/uL — ABNORMAL LOW (ref 4.22–5.81)
RDW: 13.9 % (ref 11.5–15.5)
WBC: 7.4 10*3/uL (ref 4.0–10.5)
nRBC: 0 % (ref 0.0–0.2)

## 2018-12-22 LAB — GLUCOSE, CAPILLARY
Glucose-Capillary: 75 mg/dL (ref 70–99)
Glucose-Capillary: 126 mg/dL — ABNORMAL HIGH (ref 70–99)

## 2018-12-22 MED ORDER — AMLODIPINE BESYLATE 5 MG PO TABS
5.0000 mg | ORAL_TABLET | Freq: Every day | ORAL | Status: DC
  Administered 2018-12-22: 5 mg via ORAL
  Filled 2018-12-22: qty 1

## 2018-12-22 MED ORDER — METOPROLOL SUCCINATE ER 25 MG PO TB24: 25.0000 mg | ORAL_TABLET | Freq: Every day | ORAL | 2 refills | Status: DC

## 2018-12-22 MED ORDER — INSULIN GLARGINE 100 UNIT/ML ~~LOC~~ SOLN: SUBCUTANEOUS | 11 refills | Status: AC

## 2018-12-22 MED ORDER — AMLODIPINE BESYLATE 5 MG PO TABS: 5.0000 mg | ORAL_TABLET | Freq: Every day | ORAL | 0 refills | Status: DC

## 2018-12-22 MED ORDER — DOXYCYCLINE HYCLATE 100 MG PO CAPS: 100.0000 mg | ORAL_CAPSULE | Freq: Two times a day (BID) | ORAL | 0 refills | Status: AC

## 2018-12-22 MED ORDER — FERROUS SULFATE 325 (65 FE) MG PO TABS: 325.0000 mg | ORAL_TABLET | Freq: Every day | ORAL | 3 refills | Status: AC

## 2018-12-22 MED ORDER — FAMOTIDINE 20 MG PO TABS: 20.0000 mg | ORAL_TABLET | Freq: Every morning | ORAL | Status: AC

## 2018-12-22 MED ORDER — INSULIN ASPART 100 UNIT/ML ~~LOC~~ SOLN: 10.0000 [IU] | Freq: Three times a day (TID) | SUBCUTANEOUS | 11 refills | Status: AC

## 2018-12-22 MED ORDER — ISOSORBIDE MONONITRATE ER 30 MG PO TB24: 30.0000 mg | ORAL_TABLET | Freq: Every day | ORAL | 2 refills | Status: DC

## 2018-12-22 NOTE — Progress Notes (Signed)
Removed IV-clean, dry, intact. Genell reviewed d/c paperwork with patient. Reviewed new meds and changes. Answered all questions. Wheeled stable patient and belongings to short stay entrance where he was picked up by his wife to d/c to home.

## 2018-12-22 NOTE — Care Management (Addendum)
Reviewed PT eval from today, noted that patient de-sated to 84%. Call to RN, patient was just taken out to his car for DC and was not notified of desaturation.  Note sent to attending and discussed with RN.  Orders placed , call to [facility] to see if patient can have oxygen delivered promptly. Per Juliann Pulse of [facility], oxygen can be delivered ASAP. Will call wife to discuss.   Call to Inez Catalina - wife, discussed oxygen ordered with [facility] and that O2 will be delivered today. Advised patient not to exert himself with a long walk. Elisabeth Cara, CM phone number to call if oxygen is not delivered.

## 2018-12-22 NOTE — Discharge Summary (Signed)
Physician Discharge Summary  [name]:[id number] DOB: 02-20-1951 DOA: 12/19/2018  PCP: Dettinger, Fransisca Kaufmann, MD V[facility]:  [facility]   Admit date: 12/19/2018 Discharge date: 12/22/2018  Admitted From: Home  Disposition: Home   Recommendations for Outpatient Follow-up:  1. Follow up with PCP in 1 weeks 2. Please follow up with vascular surgery in 2 weeks 3. Please follow up with wound care center in 1-2 weeks 4. Please follow up with cardiology in 1 month  Home Health: PT, RN  Discharge Condition: STABLE   CODE STATUS: FULL    Brief Hospitalization Summary: Please see all hospital notes, images, labs for full details of the hospitalization. HPI: Jose [name] is a 68 y.o. male with medical history significant for COPD, hypertension, morbid obesity, OSA, DM 2, diastolic CHF, CAD, paroxysmal atrial fibrillation, hypothyroidism, peripheral artery disease, left diabetic foot ulcer, who presented to the ED with complaints of bilateral leg swelling, left greater than right.  Patient reports over the past week his left leg started swelling worse than right with associated redness. He has neuropathy so he is unable to tell if he has been having pain.  Denies fever or chills.  He denies difficulty breathing or cough, no chest pain, no sick contacts, he has followed lockdown restrictions.  He does not think he has added weight.  Patient is on chronic 3 L O2 mostly as needed.  ED Course: Temperature 99.3.  Systolic blood pressure 811B- 200, O2 sats initially 76%, patient placed on 3 L O2 with O2 sats increasing to > 98%.  Creatinine elevated at 1.6, baseline 1.2-1.3.  WBC 10.2.  BNP elevated at 570.  Two-view chest x-ray negative for acute abnormality, no edema or consolidation.  Left lower extremity venous Dopplers negative for DVT.  Left foot x-ray negative for acute abnormality-soft tissue swelling  May reflect cellulitis.  Started on IV vancomycin, 40 IV Lasix given hospitalist called to admit for  decompensated CHF and cellulitis.  Brief Admission Hx: 68 year old gentleman with COPD, hypertension, type 2 diabetes, diastolic CHF, atrial fibrillation, coronary artery disease presented with mild CHF exacerbation and cellulitis of the left lower extremity.  MDM/Assessment & Plan:   1. Cellulitis of the left lower extremity-patient was negative for DVT in the area and had ABI testing with an ABI of 0.6 which suggests moderate peripheral artery disease.  He is responded to IV antibiotic therapy.  His symptoms are improving.  He insists on going home.  He says he will follow up outpatient as we have recommended.  Doxycycline 100 mg BID x 7 days.  2. Neuropathic ulcer left lateral foot- patient has been seen by the wound care nurse and topical treatment noted.  He will need outpatient follow-up with the wound care center.  I placed referral in [place].  3. Moderate peripheral artery disease left lower extremity-the patient is followed by Dr. Trula Slade with vascular surgery and I spoke with his wife about getting him an appointment to follow-up.   4. Acute on chronic diastolic heart failure- the patient has diuresed 2.2 L and is feeling better.  His renal function is holding stable.  Continue current treatments. 5. Acute on chronic kidney failure stage III-creatinine is stable to improved with IV diuresis. 6. Type 2 diabetes mellitus with neurological complications-reduced his insulin some due to BS 74 just for safety, can titrate up outpatient as needed.  I discontinued glipizide as the risks versus benefits are too high. I don't want him to have  severe hypoglycemia.  I have asked him to monitor his blood sugars closely and work closely with his diabetologist.  7. Essential hypertension-blood pressure is poorly controlled and we have titrated up his medications to try and get better blood pressure control.  I have added amlodipine 5 mg and asked him to follow up closely with PCP for BP  recheck and medication adjustments.  He verbalized understanding.    DVT prophylaxis: Apixaban Code Status: Full Family Communication: Telephone update wife betty Disposition Plan: SNF  Discharge Diagnoses:  Principal Problem:   Cellulitis of left lower extremity Active Problems:   COPD (chronic obstructive pulmonary disease) ([place])   Hypertension associated with diabetes (HCC)   OSA (obstructive sleep apnea)   Hyperlipidemia associated with type 2 diabetes mellitus (HCC)   Type 2 diabetes mellitus, controlled (HCC)   Chronic diastolic CHF (congestive heart failure) (HCC)   Coronary artery disease involving native coronary artery of native heart with unstable angina pectoris (HCC)   PAF (paroxysmal atrial fibrillation) (HCC)   CRI (chronic renal insufficiency), stage 3 (moderate) (HCC)   History of ST elevation myocardial infarction (STEMI)   Carotid artery disease (HCC)   Hypothyroidism   Peripheral arterial disease ([place])   Discharge Instructions: Discharge Instructions    AMB referral to wound care center   Complete by:  As directed    Call MD for:  difficulty breathing, headache or visual disturbances   Complete by:  As directed    Call MD for:  extreme fatigue   Complete by:  As directed    Call MD for:  persistant dizziness or light-headedness   Complete by:  As directed    Call MD for:  persistant nausea and vomiting   Complete by:  As directed    Increase activity slowly   Complete by:  As directed      Allergies as of 12/22/2018      Reactions   Amiodarone    Weakness during 2018 admission   Atorvastatin    Tremors   Simvastatin    Muscle weakness      Medication List    STOP taking these medications   glipiZIDE 5 MG tablet Commonly known as:  GLUCOTROL   linaclotide 145 MCG Caps capsule Commonly known as:  Linzess     TAKE these medications   amLODipine 5 MG tablet Commonly known as:  NORVASC Take 1 tablet (5 mg total) by mouth daily for 30  days. Start taking on:  December 23, 2018   apixaban 5 MG Tabs tablet Commonly known as:  ELIQUIS Take 1 tablet (5 mg total) by mouth 2 (two) times daily.   clopidogrel 75 MG tablet Commonly known as:  PLAVIX Take 1 tablet (75 mg total) by mouth daily.   doxycycline 100 MG capsule Commonly known as:  VIBRAMYCIN Take 1 capsule (100 mg total) by mouth 2 (two) times daily for 7 days.   ergocalciferol 1.25 MG (50000 UT) capsule Commonly known as:  VITAMIN D2 Take 50,000 Units by mouth 2 (two) times a week. wednesdays and saturdays   ezetimibe 10 MG tablet Commonly known as:  ZETIA Take 1 tablet (10 mg total) by mouth daily.   famotidine 20 MG tablet Commonly known as:  Pepcid Take 1 tablet (20 mg total) by mouth every morning.   ferrous sulfate 325 (65 FE) MG tablet Take 1 tablet (325 mg total) by mouth daily with breakfast. What changed:  when to take this   Fish Oil 1000  MG Caps Take 2,000-3,000 mg See admin instructions by mouth. Take 2000 mg every morning Take 3000 mg at noon and evening.   FreeStyle Libre 14 Day Reader Kerrin Mo 1 each by Does not apply route 4 (four) times daily.   FreeStyle Libre 14 Day Sensor Misc 1 each by Does not apply route every 14 (fourteen) days.   furosemide 40 MG tablet Commonly known as:  LASIX Take 40-60 mg by mouth See admin instructions. Takes 40mg  twice daily. For fluid increase, take additional one-half tablet in the morning as needed   ICaps Areds 2 Caps Take 1 capsule by mouth 2 (two) times daily.   insulin aspart 100 UNIT/ML injection Commonly known as:  novoLOG Inject 10 Units into the skin 3 (three) times daily before meals. If you eat 50% or more of meal What changed:    how much to take  additional instructions   insulin glargine 100 UNIT/ML injection Commonly known as:  LANTUS 35 units in the AM and 35 units in the PM What changed:    how much to take  how to take this  when to take this  additional instructions    isosorbide mononitrate 30 MG 24 hr tablet Commonly known as:  IMDUR Take 1 tablet (30 mg total) by mouth daily. What changed:  how much to take   levothyroxine 50 MCG tablet Commonly known as:  SYNTHROID TAKE 1 TABLET BY MOUTH ONCE DAILY BEFORE BREAKFAST What changed:    how much to take  how to take this  when to take this  additional instructions   magnesium oxide 400 MG tablet Commonly known as:  MAG-OX Take 1 tablet (400 mg total) by mouth daily.   metFORMIN 1000 MG tablet Commonly known as:  GLUCOPHAGE Take 1 tablet (1,000 mg total) by mouth 2 (two) times daily with a meal.   metoprolol succinate 25 MG 24 hr tablet Commonly known as:  TOPROL-XL Take 1 tablet (25 mg total) by mouth daily. What changed:  how much to take   nitroGLYCERIN 0.4 MG SL tablet Commonly known as:  NITROSTAT Place 1 tablet (0.4 mg total) under the tongue every 5 (five) minutes x 3 doses as needed for chest pain.   oxyCODONE-acetaminophen 5-325 MG tablet Commonly known as:  PERCOCET/ROXICET Take 2 tablets by mouth every 6 (six) hours as needed. Take eight tablets daily as needed for pain per family and list. For bad  Back pain   sertraline 100 MG tablet Commonly known as:  ZOLOFT Take 100 mg by mouth daily.   tiotropium 18 MCG inhalation capsule Commonly known as:  SPIRIVA Place 18 mcg into inhaler and inhale at bedtime.   vitamin B-12 1000 MCG tablet Commonly known as:  CYANOCOBALAMIN Take 1,000 mcg by mouth daily.      Follow-up Information    Serafina Mitchell, MD. Schedule an appointment as soon as possible for a visit in 2 week(s).   Specialties:  Vascular Surgery, Cardiology Why:  Hospital follow up left leg wound and PAD left leg Contact information: [address] [place] [address] [phone]        [place]. Schedule an appointment as soon as possible for a visit in 1 week(s).   Specialty:  Wound Care Contact  information: [address] [phone] [place] Follow up.   Why:  PT Contact information: [place] [address]  [address] [phone]       Dettinger, Fransisca Kaufmann, MD. Schedule an appointment as soon as possible for a visit in 1 week(s).   Specialties:  Family Medicine, Cardiology Why:  Hospital follow-up Contact information: [place] [address] [phone]        Arnoldo Lenis, MD .   Specialty:  Cardiology Contact information: [place] [address] [phone]          Allergies  Allergen Reactions  . Amiodarone     Weakness during 2018 admission  . Atorvastatin     Tremors  . Simvastatin     Muscle weakness   Allergies as of 12/22/2018      Reactions   Amiodarone    Weakness during 2018 admission   Atorvastatin    Tremors   Simvastatin    Muscle weakness      Medication List    STOP taking these medications   glipiZIDE 5 MG tablet Commonly known as:  GLUCOTROL   linaclotide 145 MCG Caps capsule Commonly known as:  Linzess     TAKE these medications   amLODipine 5 MG tablet Commonly known as:  NORVASC Take 1 tablet (5 mg total) by mouth daily for 30 days. Start taking on:  December 23, 2018   apixaban 5 MG Tabs tablet Commonly known as:  ELIQUIS Take 1 tablet (5 mg total) by mouth 2 (two) times daily.   clopidogrel 75 MG tablet Commonly known as:  PLAVIX Take 1 tablet (75 mg total) by mouth daily.   doxycycline 100 MG capsule Commonly known as:  VIBRAMYCIN Take 1 capsule (100 mg total) by mouth 2 (two) times daily for 7 days.   ergocalciferol 1.25 MG (50000 UT) capsule Commonly known as:  VITAMIN D2 Take 50,000 Units by mouth 2 (two) times a week. wednesdays and saturdays   ezetimibe 10 MG tablet Commonly known as:  ZETIA Take 1 tablet (10 mg total) by mouth daily.   famotidine 20 MG tablet Commonly known as:   Pepcid Take 1 tablet (20 mg total) by mouth every morning.   ferrous sulfate 325 (65 FE) MG tablet Take 1 tablet (325 mg total) by mouth daily with breakfast. What changed:  when to take this   Fish Oil 1000 MG Caps Take 2,000-3,000 mg See admin instructions by mouth. Take 2000 mg every morning Take 3000 mg at noon and evening.   FreeStyle Libre 14 Day Reader Kerrin Mo 1 each by Does not apply route 4 (four) times daily.   FreeStyle Libre 14 Day Sensor Misc 1 each by Does not apply route every 14 (fourteen) days.   furosemide 40 MG tablet Commonly known as:  LASIX Take 40-60 mg by mouth See admin instructions. Takes 40mg  twice daily. For fluid increase, take additional one-half tablet in the morning as needed   ICaps Areds 2 Caps Take 1 capsule by mouth 2 (two) times daily.   insulin aspart 100 UNIT/ML injection Commonly known as:  novoLOG Inject 10 Units into the skin 3 (three) times daily before meals. If you eat 50% or more of meal What changed:    how much to take  additional instructions   insulin glargine 100 UNIT/ML injection Commonly known as:  LANTUS 35 units in the AM and 35 units in the PM What changed:    how much to take  how to take this  when to take this  additional instructions   isosorbide  mononitrate 30 MG 24 hr tablet Commonly known as:  IMDUR Take 1 tablet (30 mg total) by mouth daily. What changed:  how much to take   levothyroxine 50 MCG tablet Commonly known as:  SYNTHROID TAKE 1 TABLET BY MOUTH ONCE DAILY BEFORE BREAKFAST What changed:    how much to take  how to take this  when to take this  additional instructions   magnesium oxide 400 MG tablet Commonly known as:  MAG-OX Take 1 tablet (400 mg total) by mouth daily.   metFORMIN 1000 MG tablet Commonly known as:  GLUCOPHAGE Take 1 tablet (1,000 mg total) by mouth 2 (two) times daily with a meal.   metoprolol succinate 25 MG 24 hr tablet Commonly known as:  TOPROL-XL Take 1  tablet (25 mg total) by mouth daily. What changed:  how much to take   nitroGLYCERIN 0.4 MG SL tablet Commonly known as:  NITROSTAT Place 1 tablet (0.4 mg total) under the tongue every 5 (five) minutes x 3 doses as needed for chest pain.   oxyCODONE-acetaminophen 5-325 MG tablet Commonly known as:  PERCOCET/ROXICET Take 2 tablets by mouth every 6 (six) hours as needed. Take eight tablets daily as needed for pain per family and list. For bad  Back pain   sertraline 100 MG tablet Commonly known as:  ZOLOFT Take 100 mg by mouth daily.   tiotropium 18 MCG inhalation capsule Commonly known as:  SPIRIVA Place 18 mcg into inhaler and inhale at bedtime.   vitamin B-12 1000 MCG tablet Commonly known as:  CYANOCOBALAMIN Take 1,000 mcg by mouth daily.       Procedures/Studies: Dg Chest 2 View  Result Date: 12/19/2018 CLINICAL DATA:  Shortness of breath and lower extremity edema EXAM: CHEST - 2 VIEW COMPARISON:  July 23, 2017 FINDINGS: There is no appreciable edema or consolidation. There is cardiomegaly with pulmonary vascularity within normal limits. No adenopathy. There is mild degenerative change in thoracic spine. IMPRESSION: Stable cardiac prominence.  No edema or consolidation. Electronically Signed   By: Lowella Grip III M.D.   On: 12/19/2018 15:15   US Venous Img Lower  Left (dvt Study)  Result Date: 12/19/2018 CLINICAL DATA:  Leg swelling for 2 weeks EXAM: LEFT LOWER EXTREMITY VENOUS DUPLEX ULTRASOUND TECHNIQUE: Doppler venous assessment of the left lower extremity deep venous system was performed, including characterization of spectral flow, compressibility, and phasicity. COMPARISON:  None. FINDINGS: There is complete compressibility of the left common femoral, femoral, and popliteal veins. Doppler analysis demonstrates respiratory phasicity and augmentation of flow with calf compression. No obvious superficial vein or calf vein thrombosis. IMPRESSION: No evidence of left  lower extremity DVT. Electronically Signed   By: Marybelle Killings M.D.   On: 12/19/2018 14:47   US Arterial Abi (screening Lower Extremity)  Result Date: 12/20/2018 CLINICAL DATA:  Left leg edema.  History of diabetic foot ulcer. EXAM: NONINVASIVE PHYSIOLOGIC VASCULAR STUDY OF BILATERAL LOWER EXTREMITIES TECHNIQUE: Evaluation of both lower extremities were performed at rest, including calculation of ankle-brachial indices with single level Doppler, pressure and pulse volume recording. COMPARISON:  None. FINDINGS: Right ABI:  Could not be obtained due to incompressible vessels. Left ABI:  0.69 Right Lower Extremity: Triphasic waveforms in the right dorsalis pedis artery. Irregular waveforms in the right posterior tibial artery. Left Lower Extremity:  Irregular waveforms in the left ankle. 0.5-0.79 Moderate PAD IMPRESSION: 1. Right ankle-brachial index could not be obtained due to incompressible vessels. 2. Left ankle-brachial index is 0.69 and  suggests moderate peripheral arterial disease. Electronically Signed   By: Markus Daft M.D.   On: 12/20/2018 13:50   Dg Foot Complete Left  Result Date: 12/19/2018 CLINICAL DATA:  Left foot pain EXAM: LEFT FOOT - COMPLETE 3+ VIEW COMPARISON:  None. FINDINGS: There is no evidence of fracture or dislocation. There is no evidence of arthropathy or other focal bone abnormality. There are enthesopathic changes at the Achilles tendon insertion. There is a small plantar calcaneal spur. There is soft tissue swelling along the dorsal aspect of the foot. Soft tissue wound along the lateral aspect of the midfoot. IMPRESSION: 1.  No acute osseous injury of the left foot. 2. Soft tissue swelling along the dorsal aspect of the foot which may reflect reactive changes versus cellulitis. Electronically Signed   By: Kathreen Devoid   On: 12/19/2018 16:53      Subjective: Pt says that he is ambulating better and feeling better.  Left leg is much less swollen and red.  He insists on going  home today.   Discharge Exam: Vitals:   12/21/18 2102 12/22/18 0512  BP: (!) 158/67 (!) 161/85  Pulse: 78 73  Resp:    Temp: 98.1 F (36.7 C) 98.5 F (36.9 C)  SpO2: 95% 97%   Vitals:   12/21/18 1327 12/21/18 2047 12/21/18 2102 12/22/18 0512  BP: (!) 145/95  (!) 158/67 (!) 161/85  Pulse: 78  78 73  Resp: 19     Temp: 98.4 F (36.9 C)  98.1 F (36.7 C) 98.5 F (36.9 C)  TempSrc: Oral  Oral Oral  SpO2: 96% 94% 95% 97%  Weight:    129.2 kg  Height:       General exam: awake, alert, NAD, cooperative.   Respiratory system: Clear. No increased work of breathing. Cardiovascular system: normal S1 & S2 heard. 1+ pedal edema bilateral. Gastrointestinal system: Abdomen is nondistended, soft and nontender. Normal bowel sounds heard. Central nervous system: Alert and oriented. No focal neurological deficits. Extremities: warm extremities bilateral LEs. Improved redness and tenderness of left leg cellulitis.  appears to be healing, left lateral foot neuropathic wound clean and dry.     The results of significant diagnostics from this hospitalization (including imaging, microbiology, ancillary and laboratory) are listed below for reference.     Microbiology: No results found for this or any previous visit (from the past 240 hour(s)).   Labs: BNP (last 3 results) Recent Labs    12/19/18 1415  BNP 160.1*   Basic Metabolic Panel: Recent Labs  Lab 12/19/18 1415 12/20/18 0506 12/21/18 1047 12/22/18 0625  NA 137 139 136 138  K 4.2 4.4 3.7 3.9  CL 97* 98 97* 97*  CO2 29 31 30  33*  GLUCOSE 167* 189* 150* 93  BUN 34* 29* 23 21  CREATININE 1.63* 1.47* 1.33* 1.45*  CALCIUM 8.9 8.8* 8.5* 8.6*  PHOS  --   --   --  3.8   Liver Function Tests: Recent Labs  Lab 12/19/18 1415 12/21/18 1047 12/22/18 0625  AST 16 19  --   ALT 19 16  --   ALKPHOS 55 50  --   BILITOT 0.7 0.5  --   PROT 7.5 6.5  --   ALBUMIN 2.7* 2.5* 2.6*   No results for input(s): LIPASE, AMYLASE in the  last 168 hours. No results for input(s): AMMONIA in the last 168 hours. CBC: Recent Labs  Lab 12/19/18 1415 12/21/18 1047 12/22/18 0625  WBC 10.2 8.3 7.4  NEUTROABS 8.0*  --  5.1  HGB 11.6* 10.9* 11.2*  HCT 38.4* 35.5* 36.7*  MCV 91.9 91.0 91.3  PLT 312 277 329   Cardiac Enzymes: No results for input(s): CKTOTAL, CKMB, CKMBINDEX, TROPONINI in the last 168 hours. BNP: Invalid input(s): POCBNP CBG: Recent Labs  Lab 12/21/18 1100 12/21/18 1702 12/21/18 2059 12/22/18 0813 12/22/18 1110  GLUCAP 144* 116* 223* 75 126*   D-Dimer No results for input(s): DDIMER in the last 72 hours. Hgb A1c Recent Labs    12/21/18 1047  HGBA1C 7.0*   Lipid Profile No results for input(s): CHOL, HDL, LDLCALC, TRIG, CHOLHDL, LDLDIRECT in the last 72 hours. Thyroid function studies No results for input(s): TSH, T4TOTAL, T3FREE, THYROIDAB in the last 72 hours.  Invalid input(s): FREET3 Anemia work up No results for input(s): VITAMINB12, FOLATE, FERRITIN, TIBC, IRON, RETICCTPCT in the last 72 hours. Urinalysis    Component Value Date/Time   COLORURINE YELLOW 08/02/2017 2044   APPEARANCEUR CLOUDY (A) 08/02/2017 2044   LABSPEC 1.014 08/02/2017 2044   PHURINE 5.0 08/02/2017 2044   GLUCOSEU NEGATIVE 08/02/2017 2044   HGBUR SMALL (A) 08/02/2017 2044   BILIRUBINUR NEGATIVE 08/02/2017 2044   KETONESUR NEGATIVE 08/02/2017 2044   PROTEINUR 100 (A) 08/02/2017 2044   NITRITE NEGATIVE 08/02/2017 2044   LEUKOCYTESUR NEGATIVE 08/02/2017 2044   Sepsis Labs Invalid input(s): PROCALCITONIN,  WBC,  LACTICIDVEN Microbiology No results found for this or any previous visit (from the past 240 hour(s)).  Time coordinating discharge: 31 mins  SIGNED:  Irwin Brakeman, MD  [facility] 12/22/2018, 11:25 AM How to contact the [facility] Attending or Consulting provider [place] or covering provider during after hours [place], for this patient?  1. Check the care team in [facility] and look for a)  attending/consulting [facility] provider listed and b) the [facility] team listed 2. Log into [email or web address] and use [place]'s universal password to access. If you do not have the password, please contact the hospital operator. 3. Locate the [facility] provider you are looking for under [facility] and page to a number that you can be directly reached. 4. If you still have difficulty reaching the provider, please page the [facility] (Director on Call) for the Hospitalists listed on amion for assistance.

## 2018-12-22 NOTE — Discharge Instructions (Signed)
Cleanse left foot wound with saline. Apply Xeroform gauze Kellie Simmering [phone]), cover with dry gauze, secure with kerlex.  Change daily.  Cellulitis, Adult  Cellulitis is a skin infection. The infected area is often warm, red, swollen, and sore. It occurs most often in the arms and lower legs. It is very important to get treated for this condition. What are the causes? This condition is caused by bacteria. The bacteria enter through a break in the skin, such as a cut, burn, insect bite, open sore, or crack. What increases the risk? This condition is more likely to occur in people who:  Have a weak body defense system (immune system).  Have open cuts, burns, bites, or scrapes on the skin.  Are older than 68 years of age.  Have a blood sugar problem (diabetes).  Have a long-lasting (chronic) liver disease (cirrhosis) or kidney disease.  Are very overweight (obese).  Have a skin problem, such as: ? Itchy rash (eczema). ? Slow movement of blood in the veins (venous stasis). ? Fluid buildup below the skin (edema).  Have been treated with high-energy rays (radiation).  Use IV drugs. What are the signs or symptoms? Symptoms of this condition include:  Skin that is: ? Red. ? Streaking. ? Spotting. ? Swollen. ? Sore or painful when you touch it. ? Warm.  A fever.  Chills.  Blisters. How is this diagnosed? This condition is diagnosed based on:  Medical history.  Physical exam.  Blood tests.  Imaging tests. How is this treated? Treatment for this condition may include:  Medicines to treat infections or allergies.  Home care, such as: ? Rest. ? Placing cold or warm cloths (compresses) on the skin.  Hospital care, if the condition is very bad. Follow these instructions at home: Medicines  Take over-the-counter and prescription medicines only as told by your doctor.  If you were prescribed an antibiotic medicine, take it as told by your doctor. Do not stop taking it  even if you start to feel better. General instructions   Drink enough fluid to keep your pee (urine) pale yellow.  Do not touch or rub the infected area.  Raise (elevate) the infected area above the level of your heart while you are sitting or lying down.  Place cold or warm cloths on the area as told by your doctor.  Keep all follow-up visits as told by your doctor. This is important. Contact a doctor if:  You have a fever.  You do not start to get better after 1-2 days of treatment.  Your bone or joint under the infected area starts to hurt after the skin has healed.  Your infection comes back. This can happen in the same area or another area.  You have a swollen bump in the area.  You have new symptoms.  You feel ill and have muscle aches and pains. Get help right away if:  Your symptoms get worse.  You feel very sleepy.  You throw up (vomit) or have watery poop (diarrhea) for a long time.  You see red streaks coming from the area.  Your red area gets larger.  Your red area turns dark in color. These symptoms may represent a serious problem that is an emergency. Do not wait to see if the symptoms will go away. Get medical help right away. Call your local emergency services (911 in the [place] Do not drive yourself to the hospital. Summary  Cellulitis is a skin infection. The area is often warm,  red, swollen, and sore.  This condition is treated with medicines, rest, and cold and warm cloths.  Take all medicines only as told by your doctor.  Tell your doctor if symptoms do not start to get better after 1-2 days of treatment. This information is not intended to replace advice given to you by your health care provider. Make sure you discuss any questions you have with your health care provider. Document Released: 02/02/2008 Document Revised: 01/05/2018 Document Reviewed: 01/05/2018 Elsevier Interactive Patient Education  2019 Else[employer] Inc[employer]   IMPORTANT  INFORMATION: PAY CLOSE ATTENTION   PHYSICIAN DISCHARGE INSTRUCTIONS  Follow with Primary care provider  Dettinger, Fransisca Kaufmann, MD  and other consultants as instructed your Hospitalist Physician  [place] IF SYMPTOMS COME BACK, WORSEN OR NEW PROBLEM DEVELOPS.   Please note: You were cared for by a hospitalist during your hospital stay. Every effort will be made to forward records to your primary care provider.  You can request that your primary care provider send for your hospital records if they have not received them.  Once you are discharged, your primary care physician will handle any further medical issues. Please note that NO REFILLS for any discharge medications will be authorized once you are discharged, as it is imperative that you return to your primary care physician (or establish a relationship with a primary care physician if you do not have one) for your post hospital discharge needs so that they can reassess your need for medications and monitor your lab values.  Please get a complete blood count and chemistry panel checked by your Primary MD at your next visit, and again as instructed by your Primary MD.  Get Medicines reviewed and adjusted: Please take all your medications with you for your next visit with your Primary MD  Laboratory/radiological data: Please request your Primary MD to go over all hospital tests and procedure/radiological results at the follow up, please ask your primary care provider to get all Hospital records sent to his/her office.  In some cases, they will be blood work, cultures and biopsy results pending at the time of your discharge. Please request that your primary care provider follow up on these results.  If you are diabetic, please bring your blood sugar readings with you to your follow up appointment with primary care.    Please call and make your follow up appointments as soon as possible.    Also Note the  following: If you experience worsening of your admission symptoms, develop shortness of breath, life threatening emergency, suicidal or homicidal thoughts you must seek medical attention immediately by calling 911 or calling your MD immediately  if symptoms less severe.  You must read complete instructions/literature along with all the possible adverse reactions/side effects for all the Medicines you take and that have been prescribed to you. Take any new Medicines after you have completely understood and accpet all the possible adverse reactions/side effects.   Do not drive when taking Pain medications or sleeping medications (Benzodiazepines)  Do not take more than prescribed Pain, Sleep and Anxiety Medications. It is not advisable to combine anxiety,sleep and pain medications without talking with your primary care practitioner  Special Instructions: If you have smoked or chewed Tobacco  in the last 2 yrs please stop smoking, stop any regular Alcohol  and or any Recreational drug use.  Wear Seat belts while driving.     Blood Glucose Monitoring, Adult Monitoring your blood sugar (glucose)  is an important part of managing your diabetes (diabetes mellitus). Blood glucose monitoring involves checking your blood glucose as often as directed and keeping a record (log) of your results over time. Checking your blood glucose regularly and keeping a blood glucose log can:  Help you and your health care provider adjust your diabetes management plan as needed, including your medicines or insulin.  Help you understand how food, exercise, illnesses, and medicines affect your blood glucose.  Let you know what your blood glucose is at any time. You can quickly find out if you have low blood glucose (hypoglycemia) or high blood glucose (hyperglycemia). Your health care provider will set individualized treatment goals for you. Your goals will be based on your age, other medical conditions you have, and how  you respond to diabetes treatment. Generally, the goal of treatment is to maintain the following blood glucose levels:  Before meals (preprandial): 80-130 mg/dL (4.4-7.2 mmol/L).  After meals (postprandial): below 180 mg/dL (10 mmol/L).  A1c level: less than 7%. Supplies needed:  Blood glucose meter.  Test strips for your meter. Each meter has its own strips. You must use the strips that came with your meter.  A needle to prick your finger (lancet). Do not use a lancet more than one time.  A device that holds the lancet (lancing device).  A journal or log book to write down your results. How to check your blood glucose  1. Wash your hands with soap and water. 2. Prick the side of your finger (not the tip) with the lancet. Use a different finger each time. 3. Gently rub the finger until a small drop of blood appears. 4. Follow instructions that come with your meter for inserting the test strip, applying blood to the strip, and using your blood glucose meter. 5. Write down your result and any notes. Some meters allow you to use areas of your body other than your finger (alternative sites) to test your blood. The most common alternative sites are:  Forearm.  Thigh.  Palm of the hand. If you think you may have hypoglycemia, or if you have a history of not knowing when your blood glucose is getting low (hypoglycemia unawareness), do not use alternative sites. Use your finger instead. Alternative sites may not be as accurate as the fingers, because blood flow is slower in these areas. This means that the result you get may be delayed, and it may be different from the result that you would get from your finger. Follow these instructions at home: Blood glucose log   Every time you check your blood glucose, write down your result. Also write down any notes about things that may be affecting your blood glucose, such as your diet and exercise for the day. This information can help you and  your health care provider: ? Look for patterns in your blood glucose over time. ? Adjust your diabetes management plan as needed.  Check if your meter allows you to download your records to a computer. Most glucose meters store a record of glucose readings in the meter. If you have type 1 diabetes:  Check your blood glucose 2 or more times a day.  Also check your blood glucose: ? Before every insulin injection. ? Before and after exercise. ? Before meals. ? 2 hours after a meal. ? Occasionally between 2:00 a.m. and 3:00 a.m., as directed. ? Before potentially dangerous tasks, like driving or using heavy machinery. ? At bedtime.  You may need to  check your blood glucose more often, up to 6-10 times a day, if you: ? Use an insulin pump. ? Need multiple daily injections (MDI). ? Have diabetes that is not well-controlled. ? Are ill. ? Have a history of severe hypoglycemia. ? Have hypoglycemia unawareness. If you have type 2 diabetes:  If you take insulin or other diabetes medicines, check your blood glucose 2 or more times a day.  If you are on intensive insulin therapy, check your blood glucose 4 or more times a day. Occasionally, you may also need to check between 2:00 a.m. and 3:00 a.m., as directed.  Also check your blood glucose: ? Before and after exercise. ? Before potentially dangerous tasks, like driving or using heavy machinery.  You may need to check your blood glucose more often if: ? Your medicine is being adjusted. ? Your diabetes is not well-controlled. ? You are ill. General tips  Always keep your supplies with you.  If you have questions or need help, all blood glucose meters have a 24-hour "hotline" phone number that you can call. You may also contact your health care provider.  After you use a few boxes of test strips, adjust (calibrate) your blood glucose meter by following instructions that came with your meter. Contact a health care provider if:  Your  blood glucose is at or above 240 mg/dL (13.3 mmol/L) for 2 days in a row.  You have been sick or have had a fever for 2 days or longer, and you are not getting better.  You have any of the following problems for more than 6 hours: ? You cannot eat or drink. ? You have nausea or vomiting. ? You have diarrhea. Get help right away if:  Your blood glucose is lower than 54 mg/dL (3 mmol/L).  You become confused or you have trouble thinking clearly.  You have difficulty breathing.  You have moderate or large ketone levels in your urine. Summary  Monitoring your blood sugar (glucose) is an important part of managing your diabetes (diabetes mellitus).  Blood glucose monitoring involves checking your blood glucose as often as directed and keeping a record (log) of your results over time.  Your health care provider will set individualized treatment goals for you. Your goals will be based on your age, other medical conditions you have, and how you respond to diabetes treatment.  Every time you check your blood glucose, write down your result. Also write down any notes about things that may be affecting your blood glucose, such as your diet and exercise for the day. This information is not intended to replace advice given to you by your health care provider. Make sure you discuss any questions you have with your health care provider. Document Released: 08/19/2003 Document Revised: 06/27/2017 Document Reviewed: 01/26/2016 Elsevier Interactive Patient Education  2019 [employer].

## 2018-12-22 NOTE — Care Management (Signed)
SATURATION QUALIFICATIONS: (This note is used to comply with regulatory documentation for home oxygen)  Patient Saturations on Room Air at Rest = 94 %  Patient Saturations on Room Air while Ambulating = 84 %  Patient Saturations on 2 Liters of oxygen while Ambulating 94 %.

## 2018-12-22 NOTE — Progress Notes (Signed)
Physical Therapy Treatment Patient Details Name: Jose [name] MRN: [id number] DOB: 01-15-1951 Today's Date: 12/22/2018    History of Present Illness Jose [name] is a 68 y.o. male with medical history significant for COPD, hypertension, morbid obesity, OSA, DM 2, diastolic CHF, CAD, paroxysmal atrial fibrillation, hypothyroidism, peripheral artery disease, left diabetic foot ulcer, who presented to the ED with complaints of bilateral leg swelling, left greater than right.  Patient reports over the past week his left leg started swelling worse than right with associated redness.  He has neuropathy so he is unable to tell if he has been having pain.  Denies fever or chills.    PT Comments    Attempted PT session this morning, pt c/o nausea and fatigue and requested therapist to return later, did assist with supervision SPT from chair to bed to rest.  Upon return pt willing to participate with therapy.  Pt limited by LBP, monitored through session.  Pt removed nasal cannula with ability to stay at 94% oxygen saturation sitting as well as standing.  Gait training complete with room air, used RW with TTWB Lt LE due to plantar aspect wound.  Pt able to complete safe mechanics, was limited by fatigue following 20 ft and decreased O2 saturation to 84%.  Returned nasal cannula with 2L and able to return to 94% within seconds.  EOS pt left in chair with call bell within reach and RN aware of status.    Follow Up Recommendations  Home health PT;Supervision - Intermittent     Equipment Recommendations  None recommended by PT    Recommendations for Other Services       Precautions / Restrictions Precautions Precautions: Fall Restrictions Weight Bearing Restrictions: No    Mobility  Bed Mobility Overal bed mobility: Modified Independent             General bed mobility comments: increased time, no assistance required  Transfers Overall transfer level: Modified independent Equipment  used: Rolling walker (2 wheeled) Transfers: Sit to/from [facility] Sit to Stand: Supervision Stand pivot transfers: Supervision       General transfer comment: slightly labored movement  Ambulation/Gait Ambulation/Gait assistance: Min guard;Supervision Gait Distance (Feet): 20 Feet Assistive device: Rolling walker (2 wheeled) Gait Pattern/deviations: Decreased step length - right;Decreased step length - left;Decreased stance time - left;Decreased stride length Gait velocity: decreased   General Gait Details: limited to ambulating to doorway and back to bedside with slightly labored cadence with mostly TTWB on left foot due to wound, limited secondary to c/o fatigue   Stairs             Wheelchair Mobility    Modified Rankin (Stroke Patients Only)       Balance                                            Cognition Arousal/Alertness: Awake/alert Behavior During Therapy: WFL for tasks assessed/performed Overall Cognitive Status: Within Functional Limits for tasks assessed                                        Exercises      General Comments        Pertinent Vitals/Pain Pain Assessment: 0-10 Pain Score: 6  Pain Location: low back Pain  Descriptors / Indicators: Aching Pain Intervention(s): Limited activity within patient's tolerance;Monitored during session    Home Living                      Prior Function            PT Goals (current goals can now be found in the care plan section)      Frequency    Min 3X/week      PT Plan      Co-evaluation              AM-PAC PT "6 Clicks" Mobility   Outcome Measure  Help needed turning from your back to your side while in a flat bed without using bedrails?: None Help needed moving from lying on your back to sitting on the side of a flat bed without using bedrails?: None Help needed moving to and from a bed to a chair (including a  wheelchair)?: A Little Help needed standing up from a chair using your arms (e.g., wheelchair or bedside chair)?: None Help needed to walk in hospital room?: A Little Help needed climbing 3-5 steps with a railing? : A Lot 6 Click Score: 20    End of Session Equipment Utilized During Treatment: Gait belt Activity Tolerance: Patient tolerated treatment well;Patient limited by fatigue Patient left: in chair;with call bell/phone within reach(RN aware of status) Nurse Communication: Mobility status PT Visit Diagnosis: Unsteadiness on feet (R26.81);Muscle weakness (generalized) (M62.81);Other abnormalities of gait and mobility (R26.89)     Time: 0355-9741 PT Time Calculation (min) (ACUTE ONLY): 25 min  Charges:  $Therapeutic Activity: 23-37 mins                     [address] LPTA; CBIS [phone]   Aldona Lento 12/22/2018, 11:49 AM

## 2018-12-23 DIAGNOSIS — I2510 Atherosclerotic heart disease of native coronary artery without angina pectoris: Secondary | ICD-10-CM | POA: Diagnosis not present

## 2018-12-23 DIAGNOSIS — L97529 Non-pressure chronic ulcer of other part of left foot with unspecified severity: Secondary | ICD-10-CM | POA: Diagnosis not present

## 2018-12-23 DIAGNOSIS — I5033 Acute on chronic diastolic (congestive) heart failure: Secondary | ICD-10-CM | POA: Diagnosis not present

## 2018-12-23 DIAGNOSIS — E1140 Type 2 diabetes mellitus with diabetic neuropathy, unspecified: Secondary | ICD-10-CM | POA: Diagnosis not present

## 2018-12-23 DIAGNOSIS — J449 Chronic obstructive pulmonary disease, unspecified: Secondary | ICD-10-CM | POA: Diagnosis not present

## 2018-12-23 DIAGNOSIS — I130 Hypertensive heart and chronic kidney disease with heart failure and stage 1 through stage 4 chronic kidney disease, or unspecified chronic kidney disease: Secondary | ICD-10-CM | POA: Diagnosis not present

## 2018-12-23 DIAGNOSIS — L03116 Cellulitis of left lower limb: Secondary | ICD-10-CM | POA: Diagnosis not present

## 2018-12-23 DIAGNOSIS — I480 Paroxysmal atrial fibrillation: Secondary | ICD-10-CM | POA: Diagnosis not present

## 2018-12-23 DIAGNOSIS — E1151 Type 2 diabetes mellitus with diabetic peripheral angiopathy without gangrene: Secondary | ICD-10-CM | POA: Diagnosis not present

## 2018-12-23 DIAGNOSIS — E11621 Type 2 diabetes mellitus with foot ulcer: Secondary | ICD-10-CM | POA: Diagnosis not present

## 2018-12-24 ENCOUNTER — Encounter: Payer: Self-pay | Admitting: General Surgery

## 2018-12-25 DIAGNOSIS — I2510 Atherosclerotic heart disease of native coronary artery without angina pectoris: Secondary | ICD-10-CM | POA: Diagnosis not present

## 2018-12-25 DIAGNOSIS — I480 Paroxysmal atrial fibrillation: Secondary | ICD-10-CM | POA: Diagnosis not present

## 2018-12-25 DIAGNOSIS — E1151 Type 2 diabetes mellitus with diabetic peripheral angiopathy without gangrene: Secondary | ICD-10-CM | POA: Diagnosis not present

## 2018-12-25 DIAGNOSIS — I130 Hypertensive heart and chronic kidney disease with heart failure and stage 1 through stage 4 chronic kidney disease, or unspecified chronic kidney disease: Secondary | ICD-10-CM | POA: Diagnosis not present

## 2018-12-25 DIAGNOSIS — E1140 Type 2 diabetes mellitus with diabetic neuropathy, unspecified: Secondary | ICD-10-CM | POA: Diagnosis not present

## 2018-12-25 DIAGNOSIS — E11621 Type 2 diabetes mellitus with foot ulcer: Secondary | ICD-10-CM | POA: Diagnosis not present

## 2018-12-25 DIAGNOSIS — J449 Chronic obstructive pulmonary disease, unspecified: Secondary | ICD-10-CM | POA: Diagnosis not present

## 2018-12-25 DIAGNOSIS — I5033 Acute on chronic diastolic (congestive) heart failure: Secondary | ICD-10-CM | POA: Diagnosis not present

## 2018-12-25 DIAGNOSIS — L03116 Cellulitis of left lower limb: Secondary | ICD-10-CM | POA: Diagnosis not present

## 2018-12-25 DIAGNOSIS — L97529 Non-pressure chronic ulcer of other part of left foot with unspecified severity: Secondary | ICD-10-CM | POA: Diagnosis not present

## 2018-12-26 DIAGNOSIS — E08621 Diabetes mellitus due to underlying condition with foot ulcer: Secondary | ICD-10-CM | POA: Diagnosis not present

## 2018-12-27 DIAGNOSIS — E11621 Type 2 diabetes mellitus with foot ulcer: Secondary | ICD-10-CM | POA: Diagnosis not present

## 2018-12-27 DIAGNOSIS — I5033 Acute on chronic diastolic (congestive) heart failure: Secondary | ICD-10-CM | POA: Diagnosis not present

## 2018-12-27 DIAGNOSIS — L03116 Cellulitis of left lower limb: Secondary | ICD-10-CM | POA: Diagnosis not present

## 2018-12-27 DIAGNOSIS — J449 Chronic obstructive pulmonary disease, unspecified: Secondary | ICD-10-CM | POA: Diagnosis not present

## 2018-12-27 DIAGNOSIS — E1140 Type 2 diabetes mellitus with diabetic neuropathy, unspecified: Secondary | ICD-10-CM | POA: Diagnosis not present

## 2018-12-27 DIAGNOSIS — L97529 Non-pressure chronic ulcer of other part of left foot with unspecified severity: Secondary | ICD-10-CM | POA: Diagnosis not present

## 2018-12-27 DIAGNOSIS — I480 Paroxysmal atrial fibrillation: Secondary | ICD-10-CM | POA: Diagnosis not present

## 2018-12-27 DIAGNOSIS — I130 Hypertensive heart and chronic kidney disease with heart failure and stage 1 through stage 4 chronic kidney disease, or unspecified chronic kidney disease: Secondary | ICD-10-CM | POA: Diagnosis not present

## 2018-12-27 DIAGNOSIS — I2510 Atherosclerotic heart disease of native coronary artery without angina pectoris: Secondary | ICD-10-CM | POA: Diagnosis not present

## 2018-12-27 DIAGNOSIS — E1151 Type 2 diabetes mellitus with diabetic peripheral angiopathy without gangrene: Secondary | ICD-10-CM | POA: Diagnosis not present

## 2018-12-28 ENCOUNTER — Other Ambulatory Visit: Payer: Self-pay | Admitting: *Deleted

## 2018-12-28 NOTE — Patient Outreach (Signed)
[facility]) Care Management  12/28/2018  Jose [name] 02/07/1951 [id number]   Subjective: Telephone call to patient's home  / mobile number, no answer, left HIPAA compliant voicemail message, and requested call back.    Objective: Per KPN (Knowledge Performance Now, point of care tool) and chart review, patient hospitalized 12/19/2018 -12/22/2018 for Cellulitis of left lower extremity, Neuropathic ulcer left lateral foot, Moderate peripheral artery disease left lower extremity, Acute on chronic diastolic heart failure, Acute on chronic kidney failure stage III.   Patient also has a history of diabetes with neurological complications, hypertension, COPD, OSA, hyperlipidemia, PAF (paroxysmal atrial fibrillation), Coronary artery disease involving native coronary artery of native heart with unstable angina pectoris, ST elevation myocardial infarction (STEMI), Carotid artery disease, and hypothyroidism.     Assessment: Received [name] on 12/28/2018.  Red Flag trigger, Day #4, patient answered yes to the following question: Sad/hopeless/anxious/empty?  [facility] EMMI follow up pending patient contact.      Plan: RNCM will send unsuccessful outreach letter, [facility] pamphlet, handout: Know Before You Go, will call patient for 2nd telephone outreach attempt within 4 business days, [facility] EMMI follow up, and proceed with case closure, within 10 business days if no return call.      Damisha Wolff H. Annia Friendly, BSN, [place] Management [facility] Telephonic CM Phone: [phone] Fax: [phone]

## 2018-12-29 ENCOUNTER — Telehealth: Payer: Self-pay | Admitting: Family Medicine

## 2018-12-29 ENCOUNTER — Other Ambulatory Visit: Payer: Self-pay | Admitting: *Deleted

## 2018-12-29 NOTE — Telephone Encounter (Signed)
PT wife has called stating that the pt was in hosp recently has been put on oxygen. His levels while sitting are stable, but when he gets up to Walk oxygen level drops. Pt wife spoke to [facility] and they told her they need [place] to send a order for a small oxygen concentrator that way home health can come out and do a test to get the small oxygen concentrator for the pt so it will be easier for him to carry around when he gets up.   Pt is scheduled for zoom visit on 5/7 w/ Dr Dettinger

## 2018-12-29 NOTE — Patient Outreach (Signed)
[facility]) Care Management  12/29/2018  Jose [name] 1951-03-17 [id number]   Subjective: Telephone call to patient's home  / mobile number, no answer, left HIPAA compliant voicemail message, and requested call back.    Objective: Per KPN (Knowledge Performance Now, point of care tool) and chart review, patient hospitalized 12/19/2018 -12/22/2018 for Cellulitis of left lower extremity, Neuropathic ulcer left lateral foot, Moderate peripheral artery disease left lower extremity, Acute on chronic diastolic heart failure, Acute on chronic kidney failure stage III.   Patient also has a history of diabetes with neurological complications, hypertension, COPD, OSA, hyperlipidemia, PAF (paroxysmal atrial fibrillation), Coronary artery disease involving native coronary artery of native heart with unstable angina pectoris, ST elevation myocardial infarction (STEMI), Carotid artery disease, and hypothyroidism.     Assessment: Received [name] on 12/28/2018.  Red Flag trigger, Day #4, patient answered yes to the following question: Sad/hopeless/anxious/empty?  [name] EMMI follow up pending patient contact.      Plan: RNCM has sent unsuccessful outreach letter, [facility] pamphlet, handout: Know Before You Go, will call patient for 3rd telephone outreach attempt within 4 business days, [facility] EMMI follow up, and proceed with case closure, within 10 business days if no return call.     [name], BSN, [place] Management [facility] Telephonic CM Phone: [phone] Fax: [phone]

## 2018-12-29 NOTE — Telephone Encounter (Signed)
Please review and advise.

## 2018-12-30 DIAGNOSIS — E11621 Type 2 diabetes mellitus with foot ulcer: Secondary | ICD-10-CM | POA: Diagnosis not present

## 2018-12-30 DIAGNOSIS — J449 Chronic obstructive pulmonary disease, unspecified: Secondary | ICD-10-CM | POA: Diagnosis not present

## 2018-12-30 DIAGNOSIS — L97529 Non-pressure chronic ulcer of other part of left foot with unspecified severity: Secondary | ICD-10-CM | POA: Diagnosis not present

## 2018-12-30 DIAGNOSIS — I480 Paroxysmal atrial fibrillation: Secondary | ICD-10-CM | POA: Diagnosis not present

## 2018-12-30 DIAGNOSIS — E1140 Type 2 diabetes mellitus with diabetic neuropathy, unspecified: Secondary | ICD-10-CM | POA: Diagnosis not present

## 2018-12-30 DIAGNOSIS — L03116 Cellulitis of left lower limb: Secondary | ICD-10-CM | POA: Diagnosis not present

## 2018-12-30 DIAGNOSIS — I2510 Atherosclerotic heart disease of native coronary artery without angina pectoris: Secondary | ICD-10-CM | POA: Diagnosis not present

## 2018-12-30 DIAGNOSIS — I5033 Acute on chronic diastolic (congestive) heart failure: Secondary | ICD-10-CM | POA: Diagnosis not present

## 2018-12-30 DIAGNOSIS — I130 Hypertensive heart and chronic kidney disease with heart failure and stage 1 through stage 4 chronic kidney disease, or unspecified chronic kidney disease: Secondary | ICD-10-CM | POA: Diagnosis not present

## 2018-12-30 DIAGNOSIS — E1151 Type 2 diabetes mellitus with diabetic peripheral angiopathy without gangrene: Secondary | ICD-10-CM | POA: Diagnosis not present

## 2019-01-01 ENCOUNTER — Other Ambulatory Visit: Payer: Self-pay | Admitting: *Deleted

## 2019-01-01 DIAGNOSIS — L97529 Non-pressure chronic ulcer of other part of left foot with unspecified severity: Secondary | ICD-10-CM | POA: Diagnosis not present

## 2019-01-01 DIAGNOSIS — I480 Paroxysmal atrial fibrillation: Secondary | ICD-10-CM | POA: Diagnosis not present

## 2019-01-01 DIAGNOSIS — I5033 Acute on chronic diastolic (congestive) heart failure: Secondary | ICD-10-CM | POA: Diagnosis not present

## 2019-01-01 DIAGNOSIS — L03116 Cellulitis of left lower limb: Secondary | ICD-10-CM | POA: Diagnosis not present

## 2019-01-01 DIAGNOSIS — I130 Hypertensive heart and chronic kidney disease with heart failure and stage 1 through stage 4 chronic kidney disease, or unspecified chronic kidney disease: Secondary | ICD-10-CM | POA: Diagnosis not present

## 2019-01-01 DIAGNOSIS — E1151 Type 2 diabetes mellitus with diabetic peripheral angiopathy without gangrene: Secondary | ICD-10-CM | POA: Diagnosis not present

## 2019-01-01 DIAGNOSIS — E1140 Type 2 diabetes mellitus with diabetic neuropathy, unspecified: Secondary | ICD-10-CM | POA: Diagnosis not present

## 2019-01-01 DIAGNOSIS — E11621 Type 2 diabetes mellitus with foot ulcer: Secondary | ICD-10-CM | POA: Diagnosis not present

## 2019-01-01 DIAGNOSIS — I2510 Atherosclerotic heart disease of native coronary artery without angina pectoris: Secondary | ICD-10-CM | POA: Diagnosis not present

## 2019-01-01 DIAGNOSIS — J449 Chronic obstructive pulmonary disease, unspecified: Secondary | ICD-10-CM | POA: Diagnosis not present

## 2019-01-01 NOTE — Telephone Encounter (Signed)
We will have to order this during the visit because it is a home health care thing, we will talk on that day and if he has at home oximeter or way to measure his pulse ox that will benefit [place], I will forward this to [facility] as well and see if she thinks that this will work without having the oximetry testing in the office

## 2019-01-01 NOTE — Patient Outreach (Signed)
[facility]) Care Management  01/01/2019  Jose [name] July 28, 1951 [id number]   Subjective:Telephone call to patient's home / mobile number, no answer, left HIPAA compliant voicemail message, and requested call back.    Objective:Per KPN (Knowledge Performance Now, point of care tool) and chart review,patient hospitalized 12/19/2018 -12/22/2018 forCellulitis of left lower extremity,Neuropathic ulcer left lateral foot,Moderate peripheral artery disease left lower extremity,Acute on chronic diastolic heart failure,Acute on chronic kidney failure stage III. Patient also has a history of diabetes with neurological complications, hypertension,COPD, OSA, hyperlipidemia,PAF (paroxysmal atrial fibrillation),Coronary artery disease involving native coronary artery of native heart with unstable angina pectoris,ST elevation myocardial infarction (STEMI),Carotid artery disease, and hypothyroidism.     Assessment: Received Jose [name] EMMI General Discharge Red Flag Alert follow up referral on 12/28/2018. Red Flag trigger, Day #4, patient answered yes to the following question: Sad/hopeless/anxious/empty?[facility] EMMI follow up pending patient contact.     Plan:RNCM has sent unsuccessful outreach letter, [facility] pamphlet, handout: Know Before You Go, and will proceed with case closure, within 10 business days if no return call.     Ashanta Amoroso H. Annia Friendly, BSN, [place] Management [facility] Telephonic CM Phone: [phone] Fax: [phone]

## 2019-01-01 NOTE — Telephone Encounter (Signed)
Wife aware

## 2019-01-04 ENCOUNTER — Ambulatory Visit (INDEPENDENT_AMBULATORY_CARE_PROVIDER_SITE_OTHER): Payer: Medicare HMO | Admitting: Family Medicine

## 2019-01-04 ENCOUNTER — Encounter: Payer: Self-pay | Admitting: Family Medicine

## 2019-01-04 ENCOUNTER — Other Ambulatory Visit: Payer: Self-pay

## 2019-01-04 DIAGNOSIS — R0902 Hypoxemia: Secondary | ICD-10-CM

## 2019-01-04 DIAGNOSIS — I480 Paroxysmal atrial fibrillation: Secondary | ICD-10-CM | POA: Diagnosis not present

## 2019-01-04 DIAGNOSIS — I2510 Atherosclerotic heart disease of native coronary artery without angina pectoris: Secondary | ICD-10-CM | POA: Diagnosis not present

## 2019-01-04 DIAGNOSIS — I130 Hypertensive heart and chronic kidney disease with heart failure and stage 1 through stage 4 chronic kidney disease, or unspecified chronic kidney disease: Secondary | ICD-10-CM | POA: Diagnosis not present

## 2019-01-04 DIAGNOSIS — E1151 Type 2 diabetes mellitus with diabetic peripheral angiopathy without gangrene: Secondary | ICD-10-CM | POA: Diagnosis not present

## 2019-01-04 DIAGNOSIS — I152 Hypertension secondary to endocrine disorders: Secondary | ICD-10-CM

## 2019-01-04 DIAGNOSIS — I739 Peripheral vascular disease, unspecified: Secondary | ICD-10-CM

## 2019-01-04 DIAGNOSIS — E1159 Type 2 diabetes mellitus with other circulatory complications: Secondary | ICD-10-CM

## 2019-01-04 DIAGNOSIS — E11621 Type 2 diabetes mellitus with foot ulcer: Secondary | ICD-10-CM | POA: Diagnosis not present

## 2019-01-04 DIAGNOSIS — L97529 Non-pressure chronic ulcer of other part of left foot with unspecified severity: Secondary | ICD-10-CM | POA: Diagnosis not present

## 2019-01-04 DIAGNOSIS — L03116 Cellulitis of left lower limb: Secondary | ICD-10-CM

## 2019-01-04 DIAGNOSIS — I10 Essential (primary) hypertension: Secondary | ICD-10-CM

## 2019-01-04 DIAGNOSIS — E1140 Type 2 diabetes mellitus with diabetic neuropathy, unspecified: Secondary | ICD-10-CM | POA: Diagnosis not present

## 2019-01-04 DIAGNOSIS — I5033 Acute on chronic diastolic (congestive) heart failure: Secondary | ICD-10-CM | POA: Diagnosis not present

## 2019-01-04 DIAGNOSIS — J449 Chronic obstructive pulmonary disease, unspecified: Secondary | ICD-10-CM | POA: Diagnosis not present

## 2019-01-04 DIAGNOSIS — E039 Hypothyroidism, unspecified: Secondary | ICD-10-CM | POA: Diagnosis not present

## 2019-01-04 MED ORDER — LEVOTHYROXINE SODIUM 50 MCG PO TABS: ORAL_TABLET | ORAL | 3 refills | Status: AC

## 2019-01-04 MED ORDER — ISOSORBIDE MONONITRATE ER 30 MG PO TB24: 30.0000 mg | ORAL_TABLET | Freq: Every day | ORAL | 3 refills | Status: AC

## 2019-01-04 MED ORDER — METOPROLOL SUCCINATE ER 25 MG PO TB24: 25.0000 mg | ORAL_TABLET | Freq: Every day | ORAL | 3 refills | Status: AC

## 2019-01-04 NOTE — Progress Notes (Signed)
Virtual Visit via telephone Note  I connected with Jose [name] on 01/04/19 at 1035 by telephone and verified that I am speaking with the correct person using two identifiers. Jose [name] is currently located at home and wife are currently with her during visit. The provider, Jose Kaufmann Louella Medaglia, MD is located in their office at time of visit.  Attempted to do Zumba video but patient could not get it pulled up and she did not have mychart  Call ended at 1050  I discussed the limitations, risks, security and privacy concerns of performing an evaluation and management service by telephone and the availability of in person appointments. I also discussed with the patient that there may be a patient responsible charge related to this service. The patient expressed understanding and agreed to proceed.   History and Present Illness: Patient is coming in for cellulitis on the right foot from 4/21-4/24. He was in [facility].  They put him on oxygen when he left the hospital.  His oxygen dropped, he has been using 2 L nasal cannula and that has been keeping his oxygen up at home and they are looking to get a prescription for a concentrator through home health care, they are to have the tanks but not the concentrator and they said it is really hard for him to move the tanks because it so heavy for him that he cannot get around.  His wife says that his breathing is doing much better with the oxygen and that the wound care says the wound is looking a lot better and the cellulitis is still there in his leg some and that is why they re-upped the doxycycline again and he has been doing good with that.  He will continue to have 1 week follow-up with the wound care  No diagnosis found.  Outpatient Encounter Medications as of 01/04/2019  Medication Sig  . amLODipine (NORVASC) 5 MG tablet Take 1 tablet (5 mg total) by mouth daily for 30 days.  [name] Kitchen apixaban (ELIQUIS) 5 MG TABS tablet Take 1 tablet (5 mg  total) by mouth 2 (two) times daily.  . clopidogrel (PLAVIX) 75 MG tablet Take 1 tablet (75 mg total) by mouth daily.  . Continuous Blood Gluc Receiver (FREESTYLE LIBRE 14 DAY READER) DEVI 1 each by Does not apply route 4 (four) times daily.  . Continuous Blood Gluc Sensor (FREESTYLE LIBRE 14 DAY SENSOR) MISC 1 each by Does not apply route every 14 (fourteen) days.  . ergocalciferol (VITAMIN D2) 50000 UNITS capsule Take 50,000 Units by mouth 2 (two) times a week. wednesdays and saturdays  . ezetimibe (ZETIA) 10 MG tablet Take 1 tablet (10 mg total) by mouth daily.  . famotidine (PEPCID) 20 MG tablet Take 1 tablet (20 mg total) by mouth every morning.  . ferrous sulfate 325 (65 FE) MG tablet Take 1 tablet (325 mg total) by mouth daily with breakfast.  . furosemide (LASIX) 40 MG tablet Take 40-60 mg by mouth See admin instructions. Takes 40mg  twice daily. For fluid increase, take additional one-half tablet in the morning as needed  . insulin aspart (NOVOLOG) 100 UNIT/ML injection Inject 10 Units into the skin 3 (three) times daily before meals. If you eat 50% or more of meal  . insulin glargine (LANTUS) 100 UNIT/ML injection 35 units in the AM and 35 units in the PM  . isosorbide mononitrate (IMDUR) 30 MG 24 hr tablet Take 1 tablet (30 mg total) by mouth daily.  [name] Kitchen  levothyroxine (SYNTHROID, LEVOTHROID) 50 MCG tablet TAKE 1 TABLET BY MOUTH ONCE DAILY BEFORE BREAKFAST (Patient taking differently: Take 50 mcg by mouth daily before breakfast. )  . magnesium oxide (MAG-OX) 400 MG tablet Take 1 tablet (400 mg total) by mouth daily.  . metFORMIN (GLUCOPHAGE) 1000 MG tablet Take 1 tablet (1,000 mg total) by mouth 2 (two) times daily with a meal.  . metoprolol succinate (TOPROL-XL) 25 MG 24 hr tablet Take 1 tablet (25 mg total) by mouth daily.  . Multiple Vitamins-Minerals (ICAPS AREDS 2) CAPS Take 1 capsule by mouth 2 (two) times daily.  . nitroGLYCERIN (NITROSTAT) 0.4 MG SL tablet Place 1 tablet (0.4 mg  total) under the tongue every 5 (five) minutes x 3 doses as needed for chest pain.  . Omega-3 Fatty Acids (FISH OIL) 1000 MG CAPS Take 2,000-3,000 mg See admin instructions by mouth. Take 2000 mg every morning Take 3000 mg at noon and evening.  [name] Kitchen oxyCODONE-acetaminophen (PERCOCET/ROXICET) 5-325 MG per tablet Take 2 tablets by mouth every 6 (six) hours as needed. Take eight tablets daily as needed for pain per family and list. For bad  Back pain  . sertraline (ZOLOFT) 100 MG tablet Take 100 mg by mouth daily.  [name] Kitchen tiotropium (SPIRIVA) 18 MCG inhalation capsule Place 18 mcg into inhaler and inhale at bedtime.   . vitamin B-12 (CYANOCOBALAMIN) 1000 MCG tablet Take 1,000 mcg by mouth daily.   No facility-administered encounter medications on file as of 01/04/2019.     Review of Systems  Constitutional: Negative for chills and fever.  Respiratory: Positive for shortness of breath (when off oxygen). Negative for wheezing.   Cardiovascular: Negative for chest pain and leg swelling.  Skin: Positive for color change (red on legs but improved) and wound. Negative for rash.  Neurological: Negative for dizziness and light-headedness.  All other systems reviewed and are negative.   Observations/Objective: Forde Dandy was with patient's wife  Assessment and Plan: Problem List Items Addressed This Visit      Cardiovascular and Mediastinum   Hypertension associated with diabetes ([place])   Relevant Medications   metoprolol succinate (TOPROL-XL) 25 MG 24 hr tablet   isosorbide mononitrate (IMDUR) 30 MG 24 hr tablet     Endocrine   Hypothyroidism   Relevant Medications   metoprolol succinate (TOPROL-XL) 25 MG 24 hr tablet   levothyroxine (SYNTHROID) 50 MCG tablet   Diabetic ulcer of left foot associated with type 2 diabetes mellitus (HCC)     Other   Cellulitis of left lower extremity    Other Visit Diagnoses    Hypoxia    -  Primary   Relevant Orders   For home use only DME oxygen        Follow Up Instructions: He has [facility] is his [facility], he is taking more doxycyline.   He needs refills of some of his medications sent to a different pharmacy so they can pick it up including the metoprolol and isosorbide and levothyroxine which have been working well for him.  She seems to feel like he is much improved and is continuing to follow-up with the wound care and has appointment with vascular as well    I discussed the assessment and treatment plan with the patient. The patient was provided an opportunity to ask questions and all were answered. The patient agreed with the plan and demonstrated an understanding of the instructions.   The patient was advised to call back or seek an in-person evaluation if  the symptoms worsen or if the condition fails to improve as anticipated.  The above assessment and management plan was discussed with the patient. The patient verbalized understanding of and has agreed to the management plan. Patient is aware to call the clinic if symptoms persist or worsen. Patient is aware when to return to the clinic for a follow-up visit. Patient educated on when it is appropriate to go to the emergency department.    I provided 15 minutes of non-face-to-face time during this encounter.    Worthy Rancher, MD

## 2019-01-05 ENCOUNTER — Ambulatory Visit (INDEPENDENT_AMBULATORY_CARE_PROVIDER_SITE_OTHER): Payer: Medicare HMO

## 2019-01-05 ENCOUNTER — Other Ambulatory Visit: Payer: Self-pay

## 2019-01-05 DIAGNOSIS — L03116 Cellulitis of left lower limb: Secondary | ICD-10-CM | POA: Diagnosis not present

## 2019-01-05 DIAGNOSIS — E11621 Type 2 diabetes mellitus with foot ulcer: Secondary | ICD-10-CM | POA: Diagnosis not present

## 2019-01-05 DIAGNOSIS — I2510 Atherosclerotic heart disease of native coronary artery without angina pectoris: Secondary | ICD-10-CM | POA: Diagnosis not present

## 2019-01-05 DIAGNOSIS — L97529 Non-pressure chronic ulcer of other part of left foot with unspecified severity: Secondary | ICD-10-CM | POA: Diagnosis not present

## 2019-01-05 DIAGNOSIS — I130 Hypertensive heart and chronic kidney disease with heart failure and stage 1 through stage 4 chronic kidney disease, or unspecified chronic kidney disease: Secondary | ICD-10-CM

## 2019-01-06 ENCOUNTER — Telehealth: Payer: Self-pay | Admitting: Nurse Practitioner

## 2019-01-06 DIAGNOSIS — L03116 Cellulitis of left lower limb: Secondary | ICD-10-CM | POA: Diagnosis not present

## 2019-01-06 MED ORDER — CIPROFLOXACIN HCL 500 MG PO TABS: 500.0000 mg | ORAL_TABLET | Freq: Two times a day (BID) | ORAL | 0 refills | Status: DC

## 2019-01-06 NOTE — Telephone Encounter (Signed)
@  LOGO@  Virtual Visit via telephone Note  I connected with Jose [name] on 01/06/19 at 8:20 by telephone and verified that I am speaking with the correct person using two identifiers. Jose [name] is currently located at home and his wife is currently with her during visit. The provider, Mary-Margaret Hassell Done, FNP is located in their office at time of visit.  I discussed the limitations, risks, security and privacy concerns of performing an evaluation and management service by telephone and the availability of in person appointments. I also discussed with the patient that there may be a patient responsible charge related to this service. The patient expressed understanding and agreed to proceed.   History and Present Illness:  Patients wife calls in today stating that patient was in hospital 2 weeks ago for cellulitis. He was given doxycycline 500 BID for 7 days once home. He finished that a week ago Friday. [facility] and was concerned because she said that his leg still looked a little red. She started him on augmentin. His wife said last nigt he got up every 84minutes to void. He said that the feeling of needing to void would not go away. He denies any dysuria and no fever. His wife was wondering if this is caused by augmentin. Nothing else has changed since starting augmentin. She said that his leg is still a little red and slightly warm to touch.       Observations/Objective: alert and oriented- answers all questions approrpiately No distress  Assessment and Plan: Cellulitis lower ext Stop augmentin Start cipro 500mg  1 po bid for 7 days Elevate leg when sitting Wife will call back if not getting any better  Follow Up Instructions:  Monday with dr. Warrick Parisian if no better    I discussed the assessment and treatment plan with the patient. The patient was provided an opportunity to ask questions and all were answered. The patient agreed with the plan  and demonstrated an understanding of the instructions.   The patient was advised to call back or seek an in-person evaluation if the symptoms worsen or if the condition fails to improve as anticipated.  The above assessment and management plan was discussed with the patient. The patient verbalized understanding of and has agreed to the management plan. Patient is aware to call the clinic if symptoms persist or worsen. Patient is aware when to return to the clinic for a follow-up visit. Patient educated on when it is appropriate to go to the emergency department.   Time call ended:  8 :45  I provided 20 minutes of non-face-to-face time during this encounter.    Mary-Margaret Hassell Done, FNP

## 2019-01-08 ENCOUNTER — Encounter (HOSPITAL_COMMUNITY): Payer: Medicare HMO

## 2019-01-08 ENCOUNTER — Encounter: Payer: Medicare HMO | Admitting: Surgery

## 2019-01-10 ENCOUNTER — Other Ambulatory Visit: Payer: Self-pay | Admitting: *Deleted

## 2019-01-10 DIAGNOSIS — I130 Hypertensive heart and chronic kidney disease with heart failure and stage 1 through stage 4 chronic kidney disease, or unspecified chronic kidney disease: Secondary | ICD-10-CM | POA: Diagnosis not present

## 2019-01-10 DIAGNOSIS — E11621 Type 2 diabetes mellitus with foot ulcer: Secondary | ICD-10-CM | POA: Diagnosis not present

## 2019-01-10 DIAGNOSIS — E1140 Type 2 diabetes mellitus with diabetic neuropathy, unspecified: Secondary | ICD-10-CM | POA: Diagnosis not present

## 2019-01-10 DIAGNOSIS — L03116 Cellulitis of left lower limb: Secondary | ICD-10-CM | POA: Diagnosis not present

## 2019-01-10 DIAGNOSIS — L97529 Non-pressure chronic ulcer of other part of left foot with unspecified severity: Secondary | ICD-10-CM | POA: Diagnosis not present

## 2019-01-10 DIAGNOSIS — E1151 Type 2 diabetes mellitus with diabetic peripheral angiopathy without gangrene: Secondary | ICD-10-CM | POA: Diagnosis not present

## 2019-01-10 DIAGNOSIS — I2510 Atherosclerotic heart disease of native coronary artery without angina pectoris: Secondary | ICD-10-CM | POA: Diagnosis not present

## 2019-01-10 DIAGNOSIS — I5033 Acute on chronic diastolic (congestive) heart failure: Secondary | ICD-10-CM | POA: Diagnosis not present

## 2019-01-10 DIAGNOSIS — I480 Paroxysmal atrial fibrillation: Secondary | ICD-10-CM | POA: Diagnosis not present

## 2019-01-10 DIAGNOSIS — J449 Chronic obstructive pulmonary disease, unspecified: Secondary | ICD-10-CM | POA: Diagnosis not present

## 2019-01-10 NOTE — Patient Outreach (Signed)
[place] [facility]) Care Management  01/10/2019  Jose [name] Jul 20, 1951 [id number]   No response from patient outreach attempts will proceed with case closure.     Objective:Per KPN (Knowledge Performance Now, point of care tool) and chart review,patient hospitalized 12/19/2018 -12/22/2018 forCellulitis of left lower extremity,Neuropathic ulcer left lateral foot,Moderate peripheral artery disease left lower extremity,Acute on chronic diastolic heart failure,Acute on chronic kidney failure stage III. Patient also has a history of diabetes with neurological complications, hypertension,COPD, OSA, hyperlipidemia,PAF (paroxysmal atrial fibrillation),Coronary artery disease involving native coronary artery of native heart with unstable angina pectoris,ST elevation myocardial infarction (STEMI),Carotid artery disease, and hypothyroidism.     Assessment: Received [name] on 12/28/2018. Red Flag trigger, Day #4, patient answered yes to the following question: Sad/hopeless/anxious/empty?EMMI follow up not completed due to unable to contact patient and will proceed with case closure.      Plan:Case closure due to unable to reach.     Rivers Hamrick H. Annia Friendly, BSN, [place] Management [facility] Telephonic CM Phone: [phone] Fax: [phone]

## 2019-01-11 DIAGNOSIS — I5033 Acute on chronic diastolic (congestive) heart failure: Secondary | ICD-10-CM | POA: Diagnosis not present

## 2019-01-11 DIAGNOSIS — J449 Chronic obstructive pulmonary disease, unspecified: Secondary | ICD-10-CM | POA: Diagnosis not present

## 2019-01-11 DIAGNOSIS — E1151 Type 2 diabetes mellitus with diabetic peripheral angiopathy without gangrene: Secondary | ICD-10-CM | POA: Diagnosis not present

## 2019-01-11 DIAGNOSIS — I130 Hypertensive heart and chronic kidney disease with heart failure and stage 1 through stage 4 chronic kidney disease, or unspecified chronic kidney disease: Secondary | ICD-10-CM | POA: Diagnosis not present

## 2019-01-11 DIAGNOSIS — I2510 Atherosclerotic heart disease of native coronary artery without angina pectoris: Secondary | ICD-10-CM | POA: Diagnosis not present

## 2019-01-11 DIAGNOSIS — L03116 Cellulitis of left lower limb: Secondary | ICD-10-CM | POA: Diagnosis not present

## 2019-01-11 DIAGNOSIS — E1140 Type 2 diabetes mellitus with diabetic neuropathy, unspecified: Secondary | ICD-10-CM | POA: Diagnosis not present

## 2019-01-11 DIAGNOSIS — E11621 Type 2 diabetes mellitus with foot ulcer: Secondary | ICD-10-CM | POA: Diagnosis not present

## 2019-01-11 DIAGNOSIS — I480 Paroxysmal atrial fibrillation: Secondary | ICD-10-CM | POA: Diagnosis not present

## 2019-01-11 DIAGNOSIS — L97529 Non-pressure chronic ulcer of other part of left foot with unspecified severity: Secondary | ICD-10-CM | POA: Diagnosis not present

## 2019-01-15 ENCOUNTER — Encounter: Payer: Medicare HMO | Admitting: Surgery

## 2019-01-15 ENCOUNTER — Encounter (HOSPITAL_COMMUNITY): Payer: Medicare HMO

## 2019-01-21 DIAGNOSIS — J449 Chronic obstructive pulmonary disease, unspecified: Secondary | ICD-10-CM | POA: Diagnosis not present

## 2019-01-31 ENCOUNTER — Encounter (INDEPENDENT_AMBULATORY_CARE_PROVIDER_SITE_OTHER): Payer: Self-pay

## 2019-02-01 ENCOUNTER — Ambulatory Visit: Payer: Medicare HMO | Admitting: Family Medicine

## 2019-02-19 ENCOUNTER — Other Ambulatory Visit: Payer: Self-pay

## 2019-02-20 ENCOUNTER — Encounter: Payer: Self-pay | Admitting: Family Medicine

## 2019-02-20 ENCOUNTER — Ambulatory Visit (INDEPENDENT_AMBULATORY_CARE_PROVIDER_SITE_OTHER): Payer: Medicare HMO | Admitting: Family Medicine

## 2019-02-20 VITALS — BP 169/86 | HR 97 | Temp 98.6°F | Ht 73.0 in | Wt 296.8 lb

## 2019-02-20 DIAGNOSIS — L97529 Non-pressure chronic ulcer of other part of left foot with unspecified severity: Secondary | ICD-10-CM | POA: Diagnosis not present

## 2019-02-20 DIAGNOSIS — J438 Other emphysema: Secondary | ICD-10-CM

## 2019-02-20 DIAGNOSIS — E11621 Type 2 diabetes mellitus with foot ulcer: Secondary | ICD-10-CM

## 2019-02-20 NOTE — Progress Notes (Signed)
BP (!) 169/86   Pulse 97   Temp 98.6 F (37 C) (Oral)   Ht 6\' 1"  (1.854 m)   Wt 296 lb 12.8 oz (134.6 kg)   SpO2 90% Comment: 2 L oxygen  BMI 39.16 kg/m    Subjective:   Patient ID: Jose [name], male    DOB: [birth year], 68 y.o.   MRN: [id number]  HPI: Jose [name] is a 68 y.o. male presenting on 02/20/2019 for Cellulitis (left foot - 1 month follow up. Oxygen testing )   HPI Right foot wound recheck He has been doing virtual health through wound care for his right foot wound through the [place] system.  He has not seen them in person in over a month.  They have been putting meta honey on it and covered it with a simple bandage.  Patient denies any pain and has no sensation in the foot.  The wound itself is on the lateral aspect of his left foot.  They do not see any signs of infection that they have noticed.  Appointment with vascular coming up  Patient has severe COPD and CHF and is on 2 L nasal cannula and wants to try and get a portable concentrator.  Currently has a portable tank but it is very difficult to transport because of his weakened state  Relevant past medical, surgical, family and social history reviewed and updated as indicated. Interim medical history since our last visit reviewed. Allergies and medications reviewed and updated.  Review of Systems  Constitutional: Negative for chills and fever.  Eyes: Negative for discharge.  Respiratory: Positive for shortness of breath (Chronic shortness of breath on oxygen, does not feel short of breath on oxygen). Negative for wheezing.   Cardiovascular: Negative for chest pain and leg swelling.  Musculoskeletal: Negative for back pain and gait problem.  Skin: Positive for wound. Negative for rash.  Neurological: Negative for dizziness, weakness and light-headedness.  All other systems reviewed and are negative.   Per HPI unless specifically indicated above   Allergies as of 02/20/2019      Reactions   Amiodarone    Weakness during 2018 admission   Atorvastatin    Tremors   Simvastatin    Muscle weakness      Medication List       Accurate as of February 20, 2019  2:07 PM. If you have any questions, ask your nurse or doctor.        STOP taking these medications   ciprofloxacin 500 MG tablet Commonly known as: Cipro Stopped by: Fransisca Kaufmann Rhesa Forsberg, MD     TAKE these medications   amLODipine 5 MG tablet Commonly known as: NORVASC Take 1 tablet (5 mg total) by mouth daily for 30 days.   apixaban 5 MG Tabs tablet Commonly known as: ELIQUIS Take 1 tablet (5 mg total) by mouth 2 (two) times daily.   clopidogrel 75 MG tablet Commonly known as: PLAVIX Take 1 tablet (75 mg total) by mouth daily.   ergocalciferol 1.25 MG (50000 UT) capsule Commonly known as: VITAMIN D2 Take 50,000 Units by mouth 2 (two) times a week. wednesdays and saturdays   ezetimibe 10 MG tablet Commonly known as: ZETIA Take 1 tablet (10 mg total) by mouth daily.   famotidine 20 MG tablet Commonly known as: Pepcid Take 1 tablet (20 mg total) by mouth every morning.   ferrous sulfate 325 (65 FE) MG tablet Take 1 tablet (325 mg total) by mouth daily with  breakfast.   Fish Oil 1000 MG Caps Take 2,000-3,000 mg See admin instructions by mouth. Take 2000 mg every morning Take 3000 mg at noon and evening.   FreeStyle Libre 14 Day Reader Kerrin Mo 1 each by Does not apply route 4 (four) times daily.   FreeStyle Libre 14 Day Sensor Misc 1 each by Does not apply route every 14 (fourteen) days.   furosemide 40 MG tablet Commonly known as: LASIX Take 40-60 mg by mouth See admin instructions. Takes 40mg  twice daily. For fluid increase, take additional one-half tablet in the morning as needed   ICaps Areds 2 Caps Take 1 capsule by mouth 2 (two) times daily.   insulin aspart 100 UNIT/ML injection Commonly known as: novoLOG Inject 10 Units into the skin 3 (three) times daily before meals. If you eat 50% or more of meal    insulin glargine 100 UNIT/ML injection Commonly known as: LANTUS 35 units in the AM and 35 units in the PM   isosorbide mononitrate 30 MG 24 hr tablet Commonly known as: IMDUR Take 1 tablet (30 mg total) by mouth daily.   levothyroxine 50 MCG tablet Commonly known as: SYNTHROID TAKE 1 TABLET BY MOUTH ONCE DAILY BEFORE BREAKFAST   magnesium oxide 400 MG tablet Commonly known as: MAG-OX Take 1 tablet (400 mg total) by mouth daily.   metFORMIN 1000 MG tablet Commonly known as: GLUCOPHAGE Take 1 tablet (1,000 mg total) by mouth 2 (two) times daily with a meal.   metoprolol succinate 25 MG 24 hr tablet Commonly known as: TOPROL-XL Take 1 tablet (25 mg total) by mouth daily.   nitroGLYCERIN 0.4 MG SL tablet Commonly known as: NITROSTAT Place 1 tablet (0.4 mg total) under the tongue every 5 (five) minutes x 3 doses as needed for chest pain.   oxyCODONE-acetaminophen 5-325 MG tablet Commonly known as: PERCOCET/ROXICET Take 2 tablets by mouth every 6 (six) hours as needed. Take eight tablets daily as needed for pain per family and list. For bad  Back pain   sertraline 100 MG tablet Commonly known as: ZOLOFT Take 100 mg by mouth daily.   tiotropium 18 MCG inhalation capsule Commonly known as: SPIRIVA Place 18 mcg into inhaler and inhale at bedtime.   vitamin B-12 1000 MCG tablet Commonly known as: CYANOCOBALAMIN Take 1,000 mcg by mouth daily.        Objective:   BP (!) 169/86   Pulse 97   Temp 98.6 F (37 C) (Oral)   Ht 6\' 1"  (1.854 m)   Wt 296 lb 12.8 oz (134.6 kg)   SpO2 90% Comment: 2 L oxygen  BMI 39.16 kg/m   Wt Readings from Last 3 Encounters:  02/20/19 296 lb 12.8 oz (134.6 kg)  12/22/18 284 lb 13.4 oz (129.2 kg)  09/21/18 298 lb 6.4 oz (135.4 kg)    Physical Exam Vitals signs and nursing note reviewed.  Constitutional:      General: He is not in acute distress.    Appearance: He is well-developed. He is not diaphoretic.  Eyes:     General: No  scleral icterus.    Conjunctiva/sclera: Conjunctivae normal.  Neck:     Musculoskeletal: Neck supple.     Thyroid: No thyromegaly.  Lymphadenopathy:     Cervical: No cervical adenopathy.  Skin:    Findings: No rash.       Neurological:     Mental Status: He is alert and oriented to person, place, and time.     Coordination:  Coordination normal.  Psychiatric:        Behavior: Behavior normal.    Sitting 93% off oxygen, walking down to 71% off oxygen and on 2 L back up to 90%   Assessment & Plan:   Problem List Items Addressed This Visit      Respiratory   COPD (chronic obstructive pulmonary disease) ([place])     Endocrine   Diabetic ulcer of left foot associated with type 2 diabetes mellitus ([place]) - Primary      Patient has 2 L nasal cannula needs therapy for oxygen.  Uses oxygen all the time but needs a portable concentrator  Patient should do well on the intermittent concentrator of 2 L nasal cannula. Follow up plan: Return in about 4 weeks (around 03/20/2019), or if symptoms worsen or fail to improve, for Wound recheck and diabetes.  Counseling provided for all of the vaccine components No orders of the defined types were placed in this encounter.   Caryl Pina, MD [place] [facility] 02/20/2019, 2:07 PM

## 2019-02-21 DIAGNOSIS — J449 Chronic obstructive pulmonary disease, unspecified: Secondary | ICD-10-CM | POA: Diagnosis not present

## 2019-02-23 ENCOUNTER — Telehealth (HOSPITAL_COMMUNITY): Payer: Self-pay

## 2019-02-23 NOTE — Telephone Encounter (Signed)
The above patient or their representative was contacted and gave the following answers to these questions:         Do you have any of the following symptoms? No  Fever                    Cough                   Shortness of breath  Do  you have any of the following other symptoms?    muscle pain         vomiting,        diarrhea        rash         weakness        red eye        abdominal pain         bruising          bruising or bleeding              joint pain           severe headache    Have you been in contact with someone who was or has been sick in the past 2 weeks? No  Yes                 Unsure                         Unable to assess   Does the person that you were in contact with have any of the following symptoms?   Cough         shortness of breath           muscle pain         vomiting,            diarrhea            rash            weakness           fever            red eye           abdominal pain           bruising  or  bleeding                joint pain                severe headache               Have you  or someone you have been in contact with traveled internationally in th last month?  No        If yes, which countries?   Have you  or someone you have been in contact with traveled outside [place] in th last month? No         If yes, which state and city?   COMMENTS OR ACTION PLAN FOR THIS PATIENT:          

## 2019-02-26 ENCOUNTER — Other Ambulatory Visit: Payer: Self-pay

## 2019-02-26 ENCOUNTER — Ambulatory Visit (INDEPENDENT_AMBULATORY_CARE_PROVIDER_SITE_OTHER): Payer: No Typology Code available for payment source | Admitting: Surgery

## 2019-02-26 ENCOUNTER — Encounter: Payer: Self-pay | Admitting: *Deleted

## 2019-02-26 ENCOUNTER — Ambulatory Visit (HOSPITAL_COMMUNITY)
Admission: RE | Admit: 2019-02-26 | Discharge: 2019-02-26 | Disposition: A | Discharge status: Home / Self Care | Payer: No Typology Code available for payment source | Source: Ambulatory Visit | Attending: Surgery | Admitting: Surgery

## 2019-02-26 ENCOUNTER — Encounter: Payer: Self-pay | Admitting: Surgery

## 2019-02-26 VITALS — BP 174/86 | HR 93 | Temp 97.7°F | Resp 20 | Ht 73.0 in | Wt 296.0 lb

## 2019-02-26 DIAGNOSIS — L97421 Non-pressure chronic ulcer of left heel and midfoot limited to breakdown of skin: Secondary | ICD-10-CM | POA: Diagnosis not present

## 2019-02-26 DIAGNOSIS — I739 Peripheral vascular disease, unspecified: Secondary | ICD-10-CM | POA: Insufficient documentation

## 2019-02-26 DIAGNOSIS — E11621 Type 2 diabetes mellitus with foot ulcer: Secondary | ICD-10-CM | POA: Diagnosis not present

## 2019-02-26 NOTE — Progress Notes (Signed)
[facility]ist of [place]  Patient name: Jose [name] MRN: [id number] DOB: [birth year] Sex: male   REQUESTING PROVIDER:    [facility]   REASON FOR CONSULT:    Foot wound  HISTORY OF PRESENT ILLNESS:    Jose [name] is a 68 y.o. male who I last saw him in 2017.  He initially presented in July of that year with an acute stroke.  He is having difficulty standing.  During his work-up he was found to have a high-grade left carotid artery stenosis.  After reviewing the CT scan he had circumferential significant calcification in the went up well above the angle of the mandible, near the carotid siphon.  He was not a candidate for carotid endarterectomy.  He was scheduled for angiography angiography revealed a nearly occluded left carotid artery with significant calcification.  It was felt that he was not a good candidate for carotid stenting because of his significant calcification.  Medical therapy was recommended.  During follow-up he had several ultrasound studies that revealed silent interval occlusion of his left carotid artery.  He now reports approximately a 30-month history of a lateral left foot wound.  He has been doing some kind of oxygen therapy recently that has improved.  He does have edema and works on controlling his swelling.  He is on aspirin Plavix and Eliquis.  He is having frequent nosebleeds.  The patient is a diabetic.  He suffers from hypercholesterolemia however he is statin intolerant.  PAST MEDICAL HISTORY    Past Medical History:  Diagnosis Date  . Anemia   . Anxiety   . CAD (coronary artery disease)    a. prior silent inferior MI, NSTEMI 07/2014 with chronically occluded RCA s/p unsuccessful PCI. b. STEMI 07/2017 with multivessel disease s/p failed PTCA of distal LAD.  [name] Kitchen Carotid artery disease ([place])   . Cataract   . Chronic combined systolic and diastolic CHF (congestive heart failure) ([place])   . Chronic  respiratory failure ([place])    a. Placed on home O2 01/2016.  [name] Kitchen CKD (chronic kidney disease), stage III ([place])   . COPD (chronic obstructive pulmonary disease) ([place])   . Diabetes mellitus ([place])   . Former tobacco use   . Hyperlipidemia   . Hypertension   . Hyponatremia   . Morbid obesity ([place])   . Neuropathy   . PAF (paroxysmal atrial fibrillation) ([place])    a. dx 06/2017.  [name] Kitchen Sleep apnea   . Stroke ([place]) 06/2017  . TIA (transient ischemic attack) 2017   a. hemispheric L carotid artery syndrome in setting of hypotension.     FAMILY HISTORY   Family History  Problem Relation Age of Onset  . Hypertension Father   . Heart disease Father   . Cancer Mother        uterine  . Diabetes Mother   . Diabetes Brother   . Cancer Brother        lymphoma  . Diabetes Brother     SOCIAL HISTORY:   Social History   Socioeconomic History  . Marital status: Married    Spouse name: Not on file  . Number of children: Not on file  . Years of education: Not on file  . Highest education level: Not on file  Occupational History  . Not on file  Social Needs  . Financial resource strain: Not on file  . Food insecurity    Worry: Not on file    Inability: Not  on file  . Transportation needs    Medical: Not on file    Non-medical: Not on file  Tobacco Use  . Smoking status: Former Smoker    Packs/day: 3.00    Years: 45.00    Pack years: 135.00    Types: Cigarettes    Quit date: 08/31/2007    Years since quitting: 11.4  . Smokeless tobacco: Never Used  Substance and Sexual Activity  . Alcohol use: No  . Drug use: No  . Sexual activity: Not on file  Lifestyle  . Physical activity    Days per week: Not on file    Minutes per session: Not on file  . Stress: Not on file  Relationships  . Social Herbalist on phone: Not on file    Gets together: Not on file    Attends religious service: Not on file    Active member of club or organization: Not on file    Attends meetings  of clubs or organizations: Not on file    Relationship status: Not on file  . Intimate partner violence    Fear of current or ex partner: Not on file    Emotionally abused: Not on file    Physically abused: Not on file    Forced sexual activity: Not on file  Other Topics Concern  . Not on file  Social History Narrative  . Not on file    ALLERGIES:    Allergies  Allergen Reactions  . Amiodarone     Weakness during 2018 admission  . Atorvastatin     Tremors  . Simvastatin     Muscle weakness    CURRENT MEDICATIONS:    Current Outpatient Medications  Medication Sig Dispense Refill  . apixaban (ELIQUIS) 5 MG TABS tablet Take 1 tablet (5 mg total) by mouth 2 (two) times daily. 28 tablet 0  . clopidogrel (PLAVIX) 75 MG tablet Take 1 tablet (75 mg total) by mouth daily. 30 tablet 2  . ergocalciferol (VITAMIN D2) 50000 UNITS capsule Take 50,000 Units by mouth 2 (two) times a week. wednesdays and saturdays    . ezetimibe (ZETIA) 10 MG tablet Take 1 tablet (10 mg total) by mouth daily. 30 tablet 11  . famotidine (PEPCID) 20 MG tablet Take 1 tablet (20 mg total) by mouth every morning.    . ferrous sulfate 325 (65 FE) MG tablet Take 1 tablet (325 mg total) by mouth daily with breakfast.  3  . furosemide (LASIX) 40 MG tablet Take 40-60 mg by mouth See admin instructions. Takes 40mg  twice daily. For fluid increase, take additional one-half tablet in the morning as needed    . insulin aspart (NOVOLOG) 100 UNIT/ML injection Inject 10 Units into the skin 3 (three) times daily before meals. If you eat 50% or more of meal 10 mL 11  . insulin glargine (LANTUS) 100 UNIT/ML injection 35 units in the AM and 35 units in the PM 10 mL 11  . isosorbide mononitrate (IMDUR) 30 MG 24 hr tablet Take 1 tablet (30 mg total) by mouth daily. 90 tablet 3  . levothyroxine (SYNTHROID) 50 MCG tablet TAKE 1 TABLET BY MOUTH ONCE DAILY BEFORE BREAKFAST 90 tablet 3  . magnesium oxide (MAG-OX) 400 MG tablet Take 1  tablet (400 mg total) by mouth daily. 34 tablet 3  . metFORMIN (GLUCOPHAGE) 1000 MG tablet Take 1 tablet (1,000 mg total) by mouth 2 (two) times daily with a meal. 60 tablet 3  .  metoprolol succinate (TOPROL-XL) 25 MG 24 hr tablet Take 1 tablet (25 mg total) by mouth daily. 90 tablet 3  . Multiple Vitamins-Minerals (ICAPS AREDS 2) CAPS Take 1 capsule by mouth 2 (two) times daily.    . nitroGLYCERIN (NITROSTAT) 0.4 MG SL tablet Place 1 tablet (0.4 mg total) under the tongue every 5 (five) minutes x 3 doses as needed for chest pain. 25 tablet 12  . Omega-3 Fatty Acids (FISH OIL) 1000 MG CAPS Take 2,000-3,000 mg See admin instructions by mouth. Take 2000 mg every morning Take 3000 mg at noon and evening.    [name] Kitchen oxyCODONE-acetaminophen (PERCOCET/ROXICET) 5-325 MG per tablet Take 2 tablets by mouth every 6 (six) hours as needed. Take eight tablets daily as needed for pain per family and list. For bad  Back pain    . sertraline (ZOLOFT) 100 MG tablet Take 100 mg by mouth daily.    [name] Kitchen tiotropium (SPIRIVA) 18 MCG inhalation capsule Place 18 mcg into inhaler and inhale at bedtime.     . vitamin B-12 (CYANOCOBALAMIN) 1000 MCG tablet Take 1,000 mcg by mouth daily.     No current facility-administered medications for this visit.     REVIEW OF SYSTEMS:   [X]  denotes positive finding, [ ]  denotes negative finding Cardiac  Comments:  Chest pain or chest pressure:    Shortness of breath upon exertion:    Short of breath when lying flat:    Irregular heart rhythm:        Vascular    Pain in calf, thigh, or hip brought on by ambulation:    Pain in feet at night that wakes you up from your sleep:     Blood clot in your veins:    Leg swelling:  x       Pulmonary    Oxygen at home:    Productive cough:     Wheezing:         Neurologic    Sudden weakness in arms or legs:     Sudden numbness in arms or legs:     Sudden onset of difficulty speaking or slurred speech:    Temporary loss of vision in one  eye:     Problems with dizziness:         Gastrointestinal    Blood in stool:      Vomited blood:         Genitourinary    Burning when urinating:     Blood in urine:        Psychiatric    Major depression:         Hematologic    Bleeding problems:    Problems with blood clotting too easily:        Skin    Rashes or ulcers: x       Constitutional    Fever or chills:     PHYSICAL EXAM:   There were no vitals filed for this visit.  GENERAL: The patient is a well-nourished male, in no acute distress. The vital signs are documented above. CARDIAC: There is a regular rate and rhythm.  VASCULAR: Nonpalpable pedal pulses PULMONARY: Nonlabored respirations MUSCULOSKELETAL: There are no major deformities or cyanosis. NEUROLOGIC: No focal weakness or paresthesias are detected. SKIN: See photo below PSYCHIATRIC: The patient has a normal affect.     STUDIES:   I have reviewed the following :  Venous: No evidence of left lower extremity DVT. +-------+-----------+-----------+------------+------------+ Right  1.03  0.71                                +-------+-----------+-----------+------------+------------+ Left   0.59       0.51                                +-------+-----------+-----------+------------+------------+  Right toe pressure:  176 Left toe pressure 127 ASSESSMENT and PLAN   Left foot wound: The patient has had a nonhealing wound for 5 months.  He has failed conservative therapy.  His ABIs are diminished on that side.  I think in order to optimize his chances for wound healing that we need to proceed with angiography and revascularization.  This will be through a right groin approach with intervention on the left leg.  I have this scheduled for Tuesday, July 7.  He will stop his Eliquis prior but continue his Plavix.  The risks and benefits of the procedure discussed with the patient.  All questions were answered.  They understand this is a  limb threatening situation.   Leia Alf, MD, FACS [facility] Special[facility] of [facility] [phone] Pager [phone]

## 2019-02-26 NOTE — H&P (View-Only) (Signed)
Vascular and Vein Specialist of [place]  Patient name: Jose [name] MRN: [id number] DOB: Jul 04, 1951 Sex: male   REQUESTING PROVIDER:    [facility]   REASON FOR CONSULT:    Foot wound  HISTORY OF PRESENT ILLNESS:    Jose [name] is a 68 y.o. male who I last saw him in 2017.  He initially presented in July of that year with an acute stroke.  He is having difficulty standing.  During his work-up he was found to have a high-grade left carotid artery stenosis.  After reviewing the CT scan he had circumferential significant calcification in the went up well above the angle of the mandible, near the carotid siphon.  He was not a candidate for carotid endarterectomy.  He was scheduled for angiography angiography revealed a nearly occluded left carotid artery with significant calcification.  It was felt that he was not a good candidate for carotid stenting because of his significant calcification.  Medical therapy was recommended.  During follow-up he had several ultrasound studies that revealed silent interval occlusion of his left carotid artery.  He now reports approximately a 48-month history of a lateral left foot wound.  He has been doing some kind of oxygen therapy recently that has improved.  He does have edema and works on controlling his swelling.  He is on aspirin Plavix and Eliquis.  He is having frequent nosebleeds.  The patient is a diabetic.  He suffers from hypercholesterolemia however he is statin intolerant.  PAST MEDICAL HISTORY    Past Medical History:  Diagnosis Date  . Anemia   . Anxiety   . CAD (coronary artery disease)    a. prior silent inferior MI, NSTEMI 07/2014 with chronically occluded RCA s/p unsuccessful PCI. b. STEMI 07/2017 with multivessel disease s/p failed PTCA of distal LAD.  [name] Kitchen Carotid artery disease ([place])   . Cataract   . Chronic combined systolic and diastolic CHF (congestive heart failure) ([place])   . Chronic  respiratory failure ([place])    a. Placed on home O2 01/2016.  [name] Kitchen CKD (chronic kidney disease), stage III ([place])   . COPD (chronic obstructive pulmonary disease) ([place])   . Diabetes mellitus ([place])   . Former tobacco use   . Hyperlipidemia   . Hypertension   . Hyponatremia   . Morbid obesity ([place])   . Neuropathy   . PAF (paroxysmal atrial fibrillation) ([place])    a. dx 06/2017.  [name] Kitchen Sleep apnea   . Stroke ([place]) 06/2017  . TIA (transient ischemic attack) 2017   a. hemispheric L carotid artery syndrome in setting of hypotension.     FAMILY HISTORY   Family History  Problem Relation Age of Onset  . Hypertension Father   . Heart disease Father   . Cancer Mother        uterine  . Diabetes Mother   . Diabetes Brother   . Cancer Brother        lymphoma  . Diabetes Brother     SOCIAL HISTORY:   Social History   Socioeconomic History  . Marital status: Married    Spouse name: Not on file  . Number of children: Not on file  . Years of education: Not on file  . Highest education level: Not on file  Occupational History  . Not on file  Social Needs  . Financial resource strain: Not on file  . Food insecurity    Worry: Not on file    Inability: Not  on file  . Transportation needs    Medical: Not on file    Non-medical: Not on file  Tobacco Use  . Smoking status: Former Smoker    Packs/day: 3.00    Years: 45.00    Pack years: 135.00    Types: Cigarettes    Quit date: 08/31/2007    Years since quitting: 11.4  . Smokeless tobacco: Never Used  Substance and Sexual Activity  . Alcohol use: No  . Drug use: No  . Sexual activity: Not on file  Lifestyle  . Physical activity    Days per week: Not on file    Minutes per session: Not on file  . Stress: Not on file  Relationships  . Social Herbalist on phone: Not on file    Gets together: Not on file    Attends religious service: Not on file    Active member of club or organization: Not on file    Attends meetings  of clubs or organizations: Not on file    Relationship status: Not on file  . Intimate partner violence    Fear of current or ex partner: Not on file    Emotionally abused: Not on file    Physically abused: Not on file    Forced sexual activity: Not on file  Other Topics Concern  . Not on file  Social History Narrative  . Not on file    ALLERGIES:    Allergies  Allergen Reactions  . Amiodarone     Weakness during 2018 admission  . Atorvastatin     Tremors  . Simvastatin     Muscle weakness    CURRENT MEDICATIONS:    Current Outpatient Medications  Medication Sig Dispense Refill  . apixaban (ELIQUIS) 5 MG TABS tablet Take 1 tablet (5 mg total) by mouth 2 (two) times daily. 28 tablet 0  . clopidogrel (PLAVIX) 75 MG tablet Take 1 tablet (75 mg total) by mouth daily. 30 tablet 2  . ergocalciferol (VITAMIN D2) 50000 UNITS capsule Take 50,000 Units by mouth 2 (two) times a week. wednesdays and saturdays    . ezetimibe (ZETIA) 10 MG tablet Take 1 tablet (10 mg total) by mouth daily. 30 tablet 11  . famotidine (PEPCID) 20 MG tablet Take 1 tablet (20 mg total) by mouth every morning.    . ferrous sulfate 325 (65 FE) MG tablet Take 1 tablet (325 mg total) by mouth daily with breakfast.  3  . furosemide (LASIX) 40 MG tablet Take 40-60 mg by mouth See admin instructions. Takes 40mg  twice daily. For fluid increase, take additional one-half tablet in the morning as needed    . insulin aspart (NOVOLOG) 100 UNIT/ML injection Inject 10 Units into the skin 3 (three) times daily before meals. If you eat 50% or more of meal 10 mL 11  . insulin glargine (LANTUS) 100 UNIT/ML injection 35 units in the AM and 35 units in the PM 10 mL 11  . isosorbide mononitrate (IMDUR) 30 MG 24 hr tablet Take 1 tablet (30 mg total) by mouth daily. 90 tablet 3  . levothyroxine (SYNTHROID) 50 MCG tablet TAKE 1 TABLET BY MOUTH ONCE DAILY BEFORE BREAKFAST 90 tablet 3  . magnesium oxide (MAG-OX) 400 MG tablet Take 1  tablet (400 mg total) by mouth daily. 34 tablet 3  . metFORMIN (GLUCOPHAGE) 1000 MG tablet Take 1 tablet (1,000 mg total) by mouth 2 (two) times daily with a meal. 60 tablet 3  .  metoprolol succinate (TOPROL-XL) 25 MG 24 hr tablet Take 1 tablet (25 mg total) by mouth daily. 90 tablet 3  . Multiple Vitamins-Minerals (ICAPS AREDS 2) CAPS Take 1 capsule by mouth 2 (two) times daily.    . nitroGLYCERIN (NITROSTAT) 0.4 MG SL tablet Place 1 tablet (0.4 mg total) under the tongue every 5 (five) minutes x 3 doses as needed for chest pain. 25 tablet 12  . Omega-3 Fatty Acids (FISH OIL) 1000 MG CAPS Take 2,000-3,000 mg See admin instructions by mouth. Take 2000 mg every morning Take 3000 mg at noon and evening.    [name] Kitchen oxyCODONE-acetaminophen (PERCOCET/ROXICET) 5-325 MG per tablet Take 2 tablets by mouth every 6 (six) hours as needed. Take eight tablets daily as needed for pain per family and list. For bad  Back pain    . sertraline (ZOLOFT) 100 MG tablet Take 100 mg by mouth daily.    [name] Kitchen tiotropium (SPIRIVA) 18 MCG inhalation capsule Place 18 mcg into inhaler and inhale at bedtime.     . vitamin B-12 (CYANOCOBALAMIN) 1000 MCG tablet Take 1,000 mcg by mouth daily.     No current facility-administered medications for this visit.     REVIEW OF SYSTEMS:   [X]  denotes positive finding, [ ]  denotes negative finding Cardiac  Comments:  Chest pain or chest pressure:    Shortness of breath upon exertion:    Short of breath when lying flat:    Irregular heart rhythm:        Vascular    Pain in calf, thigh, or hip brought on by ambulation:    Pain in feet at night that wakes you up from your sleep:     Blood clot in your veins:    Leg swelling:  x       Pulmonary    Oxygen at home:    Productive cough:     Wheezing:         Neurologic    Sudden weakness in arms or legs:     Sudden numbness in arms or legs:     Sudden onset of difficulty speaking or slurred speech:    Temporary loss of vision in one  eye:     Problems with dizziness:         Gastrointestinal    Blood in stool:      Vomited blood:         Genitourinary    Burning when urinating:     Blood in urine:        Psychiatric    Major depression:         Hematologic    Bleeding problems:    Problems with blood clotting too easily:        Skin    Rashes or ulcers: x       Constitutional    Fever or chills:     PHYSICAL EXAM:   There were no vitals filed for this visit.  GENERAL: The patient is a well-nourished male, in no acute distress. The vital signs are documented above. CARDIAC: There is a regular rate and rhythm.  VASCULAR: Nonpalpable pedal pulses PULMONARY: Nonlabored respirations MUSCULOSKELETAL: There are no major deformities or cyanosis. NEUROLOGIC: No focal weakness or paresthesias are detected. SKIN: See photo below PSYCHIATRIC: The patient has a normal affect.     STUDIES:   I have reviewed the following :  Venous: No evidence of left lower extremity DVT. +-------+-----------+-----------+------------+------------+ Right  1.03  0.71                                +-------+-----------+-----------+------------+------------+ Left   0.59       0.51                                +-------+-----------+-----------+------------+------------+  Right toe pressure:  176 Left toe pressure 127 ASSESSMENT and PLAN   Left foot wound: The patient has had a nonhealing wound for 5 months.  He has failed conservative therapy.  His ABIs are diminished on that side.  I think in order to optimize his chances for wound healing that we need to proceed with angiography and revascularization.  This will be through a right groin approach with intervention on the left leg.  I have this scheduled for Tuesday, July 7.  He will stop his Eliquis prior but continue his Plavix.  The risks and benefits of the procedure discussed with the patient.  All questions were answered.  They understand this is a  limb threatening situation.   Leia Alf, MD, FACS [facility] Vein Specialists of [facility] [phone] Pager [phone]

## 2019-03-01 ENCOUNTER — Other Ambulatory Visit (HOSPITAL_COMMUNITY)
Admission: RE | Admit: 2019-03-01 | Discharge: 2019-03-01 | Disposition: A | Discharge status: Home / Self Care | Payer: No Typology Code available for payment source | Source: Ambulatory Visit | Attending: Surgery | Admitting: Surgery

## 2019-03-01 DIAGNOSIS — Z01812 Encounter for preprocedural laboratory examination: Secondary | ICD-10-CM | POA: Diagnosis present

## 2019-03-01 DIAGNOSIS — Z1159 Encounter for screening for other viral diseases: Secondary | ICD-10-CM | POA: Diagnosis not present

## 2019-03-01 LAB — SARS CORONAVIRUS 2 (TAT 6-24 HRS): SARS Coronavirus 2: NEGATIVE

## 2019-03-06 ENCOUNTER — Ambulatory Visit (HOSPITAL_COMMUNITY)
Admission: RE | Admit: 2019-03-06 | Discharge: 2019-03-06 | Disposition: A | Discharge status: Home / Self Care | Payer: No Typology Code available for payment source | Attending: Surgery | Admitting: Surgery

## 2019-03-06 ENCOUNTER — Other Ambulatory Visit: Payer: Self-pay

## 2019-03-06 ENCOUNTER — Encounter (HOSPITAL_COMMUNITY)
Admission: RE | Disposition: A | Discharge status: Home / Self Care | Payer: Self-pay | Source: Home / Self Care | Attending: Surgery

## 2019-03-06 ENCOUNTER — Encounter (HOSPITAL_COMMUNITY): Payer: Self-pay | Admitting: Surgery

## 2019-03-06 ENCOUNTER — Other Ambulatory Visit: Payer: Self-pay | Admitting: *Deleted

## 2019-03-06 DIAGNOSIS — Z794 Long term (current) use of insulin: Secondary | ICD-10-CM | POA: Diagnosis not present

## 2019-03-06 DIAGNOSIS — I70245 Atherosclerosis of native arteries of left leg with ulceration of other part of foot: Secondary | ICD-10-CM | POA: Diagnosis not present

## 2019-03-06 DIAGNOSIS — N183 Chronic kidney disease, stage 3 (moderate): Secondary | ICD-10-CM | POA: Insufficient documentation

## 2019-03-06 DIAGNOSIS — D649 Anemia, unspecified: Secondary | ICD-10-CM | POA: Diagnosis not present

## 2019-03-06 DIAGNOSIS — L97529 Non-pressure chronic ulcer of other part of left foot with unspecified severity: Secondary | ICD-10-CM | POA: Diagnosis not present

## 2019-03-06 DIAGNOSIS — E11621 Type 2 diabetes mellitus with foot ulcer: Secondary | ICD-10-CM | POA: Diagnosis not present

## 2019-03-06 DIAGNOSIS — I130 Hypertensive heart and chronic kidney disease with heart failure and stage 1 through stage 4 chronic kidney disease, or unspecified chronic kidney disease: Secondary | ICD-10-CM | POA: Diagnosis not present

## 2019-03-06 DIAGNOSIS — Z9981 Dependence on supplemental oxygen: Secondary | ICD-10-CM | POA: Diagnosis not present

## 2019-03-06 DIAGNOSIS — G4730 Sleep apnea, unspecified: Secondary | ICD-10-CM | POA: Diagnosis not present

## 2019-03-06 DIAGNOSIS — I480 Paroxysmal atrial fibrillation: Secondary | ICD-10-CM | POA: Insufficient documentation

## 2019-03-06 DIAGNOSIS — J449 Chronic obstructive pulmonary disease, unspecified: Secondary | ICD-10-CM | POA: Diagnosis present

## 2019-03-06 DIAGNOSIS — Z87891 Personal history of nicotine dependence: Secondary | ICD-10-CM | POA: Diagnosis not present

## 2019-03-06 DIAGNOSIS — Z888 Allergy status to other drugs, medicaments and biological substances status: Secondary | ICD-10-CM | POA: Insufficient documentation

## 2019-03-06 DIAGNOSIS — I5042 Chronic combined systolic (congestive) and diastolic (congestive) heart failure: Secondary | ICD-10-CM | POA: Insufficient documentation

## 2019-03-06 DIAGNOSIS — Z79890 Hormone replacement therapy (postmenopausal): Secondary | ICD-10-CM | POA: Diagnosis not present

## 2019-03-06 DIAGNOSIS — Z7902 Long term (current) use of antithrombotics/antiplatelets: Secondary | ICD-10-CM | POA: Insufficient documentation

## 2019-03-06 DIAGNOSIS — E1122 Type 2 diabetes mellitus with diabetic chronic kidney disease: Secondary | ICD-10-CM | POA: Insufficient documentation

## 2019-03-06 DIAGNOSIS — I70244 Atherosclerosis of native arteries of left leg with ulceration of heel and midfoot: Secondary | ICD-10-CM

## 2019-03-06 DIAGNOSIS — E1140 Type 2 diabetes mellitus with diabetic neuropathy, unspecified: Secondary | ICD-10-CM | POA: Insufficient documentation

## 2019-03-06 DIAGNOSIS — Z8673 Personal history of transient ischemic attack (TIA), and cerebral infarction without residual deficits: Secondary | ICD-10-CM | POA: Diagnosis not present

## 2019-03-06 DIAGNOSIS — I2510 Atherosclerotic heart disease of native coronary artery without angina pectoris: Secondary | ICD-10-CM | POA: Insufficient documentation

## 2019-03-06 DIAGNOSIS — Z79899 Other long term (current) drug therapy: Secondary | ICD-10-CM | POA: Diagnosis not present

## 2019-03-06 DIAGNOSIS — I771 Stricture of artery: Secondary | ICD-10-CM | POA: Diagnosis not present

## 2019-03-06 DIAGNOSIS — Z6839 Body mass index (BMI) 39.0-39.9, adult: Secondary | ICD-10-CM | POA: Diagnosis not present

## 2019-03-06 DIAGNOSIS — E785 Hyperlipidemia, unspecified: Secondary | ICD-10-CM | POA: Insufficient documentation

## 2019-03-06 DIAGNOSIS — Z833 Family history of diabetes mellitus: Secondary | ICD-10-CM | POA: Insufficient documentation

## 2019-03-06 DIAGNOSIS — Z8249 Family history of ischemic heart disease and other diseases of the circulatory system: Secondary | ICD-10-CM | POA: Insufficient documentation

## 2019-03-06 HISTORY — PX: ABDOMINAL AORTOGRAM W/LOWER EXTREMITY: CATH118223

## 2019-03-06 LAB — POCT I-STAT, CHEM 8
BUN: 29 mg/dL — ABNORMAL HIGH (ref 8–23)
Calcium, Ion: 1.16 mmol/L (ref 1.15–1.40)
Chloride: 101 mmol/L (ref 98–111)
Creatinine, Ser: 1.5 mg/dL — ABNORMAL HIGH (ref 0.61–1.24)
Glucose, Bld: 167 mg/dL — ABNORMAL HIGH (ref 70–99)
HCT: 42 % (ref 39.0–52.0)
Hemoglobin: 14.3 g/dL (ref 13.0–17.0)
Potassium: 4 mmol/L (ref 3.5–5.1)
Sodium: 138 mmol/L (ref 135–145)
TCO2: 32 mmol/L (ref 22–32)

## 2019-03-06 LAB — GLUCOSE, CAPILLARY
Glucose-Capillary: 171 mg/dL — ABNORMAL HIGH (ref 70–99)
Glucose-Capillary: 190 mg/dL — ABNORMAL HIGH (ref 70–99)

## 2019-03-06 SURGERY — ABDOMINAL AORTOGRAM W/LOWER EXTREMITY
Anesthesia: LOCAL | Laterality: Left

## 2019-03-06 MED ORDER — LIDOCAINE HCL (PF) 1 % IJ SOLN
INTRAMUSCULAR | Status: DC | PRN
  Administered 2019-03-06: 15 mL

## 2019-03-06 MED ORDER — FUROSEMIDE 10 MG/ML IJ SOLN
40.0000 mg | Freq: Once | INTRAMUSCULAR | Status: AC
  Administered 2019-03-06: 09:00:00 40 mg via INTRAVENOUS

## 2019-03-06 MED ORDER — HYDRALAZINE HCL 20 MG/ML IJ SOLN
INTRAMUSCULAR | Status: DC | PRN
  Administered 2019-03-06: 10 mg via INTRAVENOUS

## 2019-03-06 MED ORDER — SODIUM CHLORIDE 0.9 % WEIGHT BASED INFUSION: 1.0000 mL/kg/h | INTRAVENOUS | Status: DC

## 2019-03-06 MED ORDER — SODIUM CHLORIDE 0.9 % IV SOLN: 250.0000 mL | INTRAVENOUS | Status: DC | PRN

## 2019-03-06 MED ORDER — LABETALOL HCL 5 MG/ML IV SOLN
10.0000 mg | INTRAVENOUS | Status: DC | PRN
  Administered 2019-03-06 (×2): 10 mg via INTRAVENOUS

## 2019-03-06 MED ORDER — HEPARIN (PORCINE) IN NACL 1000-0.9 UT/500ML-% IV SOLN
INTRAVENOUS | Status: DC | PRN
  Administered 2019-03-06 (×2): 500 mL

## 2019-03-06 MED ORDER — SODIUM CHLORIDE 0.9% FLUSH: 3.0000 mL | Freq: Two times a day (BID) | INTRAVENOUS | Status: DC

## 2019-03-06 MED ORDER — HEPARIN (PORCINE) IN NACL 1000-0.9 UT/500ML-% IV SOLN
INTRAVENOUS | Status: AC
  Filled 2019-03-06: qty 1000

## 2019-03-06 MED ORDER — HYDRALAZINE HCL 20 MG/ML IJ SOLN
INTRAMUSCULAR | Status: AC
  Filled 2019-03-06: qty 1

## 2019-03-06 MED ORDER — FENTANYL CITRATE (PF) 100 MCG/2ML IJ SOLN
INTRAMUSCULAR | Status: AC
  Filled 2019-03-06: qty 2

## 2019-03-06 MED ORDER — IODIXANOL 320 MG/ML IV SOLN
INTRAVENOUS | Status: DC | PRN
  Administered 2019-03-06: 72 mL via INTRA_ARTERIAL

## 2019-03-06 MED ORDER — ACETAMINOPHEN 325 MG PO TABS: 650.0000 mg | ORAL_TABLET | ORAL | Status: DC | PRN

## 2019-03-06 MED ORDER — IPRATROPIUM-ALBUTEROL 0.5-2.5 (3) MG/3ML IN SOLN
3.0000 mL | RESPIRATORY_TRACT | Status: DC | PRN
  Administered 2019-03-06: 3 mL via RESPIRATORY_TRACT

## 2019-03-06 MED ORDER — HYDRALAZINE HCL 20 MG/ML IJ SOLN: 5.0000 mg | INTRAMUSCULAR | Status: DC | PRN

## 2019-03-06 MED ORDER — ONDANSETRON HCL 4 MG/2ML IJ SOLN: 4.0000 mg | Freq: Four times a day (QID) | INTRAMUSCULAR | Status: DC | PRN

## 2019-03-06 MED ORDER — FUROSEMIDE 10 MG/ML IJ SOLN
INTRAMUSCULAR | Status: AC
  Filled 2019-03-06: qty 4

## 2019-03-06 MED ORDER — FENTANYL CITRATE (PF) 100 MCG/2ML IJ SOLN
INTRAMUSCULAR | Status: DC | PRN
  Administered 2019-03-06: 25 ug via INTRAVENOUS

## 2019-03-06 MED ORDER — SODIUM CHLORIDE 0.9% FLUSH: 3.0000 mL | INTRAVENOUS | Status: DC | PRN

## 2019-03-06 MED ORDER — ALBUTEROL SULFATE (2.5 MG/3ML) 0.083% IN NEBU
INHALATION_SOLUTION | RESPIRATORY_TRACT | Status: AC
  Filled 2019-03-06: qty 3

## 2019-03-06 MED ORDER — LIDOCAINE HCL (PF) 1 % IJ SOLN
INTRAMUSCULAR | Status: AC
  Filled 2019-03-06: qty 30

## 2019-03-06 MED ORDER — SODIUM CHLORIDE 0.9 % IV SOLN
INTRAVENOUS | Status: DC
  Administered 2019-03-06: 07:00:00 via INTRAVENOUS

## 2019-03-06 MED ORDER — LABETALOL HCL 5 MG/ML IV SOLN
INTRAVENOUS | Status: AC
  Filled 2019-03-06: qty 4

## 2019-03-06 SURGICAL SUPPLY — 13 items
CATH OMNI FLUSH 5F 65CM (CATHETERS) ×1 IMPLANT
CLOSURE MYNX CONTROL 5F (Vascular Products) ×1 IMPLANT
DEVICE TORQUE H2O (MISCELLANEOUS) ×1 IMPLANT
DRAPE ZERO GRAVITY STERILE (DRAPES) ×1 IMPLANT
GUIDEWIRE ANGLED .035X150CM (WIRE) ×1 IMPLANT
KIT MICROPUNCTURE NIT STIFF (SHEATH) ×1 IMPLANT
KIT PV (KITS) ×2 IMPLANT
SHEATH PINNACLE 5F 10CM (SHEATH) ×1 IMPLANT
SHEATH PROBE COVER 6X72 (BAG) ×1 IMPLANT
SYR MEDRAD MARK V 150ML (SYRINGE) ×1 IMPLANT
TRANSDUCER W/STOPCOCK (MISCELLANEOUS) ×2 IMPLANT
TRAY PV CATH (CUSTOM PROCEDURE TRAY) ×2 IMPLANT
WIRE BENTSON .035X145CM (WIRE) ×1 IMPLANT

## 2019-03-06 NOTE — Interval H&P Note (Signed)
History and Physical Interval Note:  03/06/2019 7:37 AM  Jose [name]  has presented today for surgery, with the diagnosis of PVD with Ulcer.  The various methods of treatment have been discussed with the patient and family. After consideration of risks, benefits and other options for treatment, the patient has consented to  Procedure(s): ABDOMINAL AORTOGRAM W/LOWER EXTREMITY (Bilateral) as a surgical intervention.  The patient's history has been reviewed, patient examined, no change in status, stable for surgery.  I have reviewed the patient's chart and labs.  Questions were answered to the patient's satisfaction.     Annamarie Major

## 2019-03-06 NOTE — Op Note (Signed)
    Patient name: Jose [name] MRN: [id number] DOB: 1951/01/14 Sex: male  03/06/2019 Pre-operative Diagnosis: Left foot ulcer Post-operative diagnosis:  Same Surgeon:  Annamarie Major Procedure Performed:  1.  Ultrasound-guided access, right femoral artery  2.  Abdominal aortogram  3.  Left lower extremity runoff  4.  Second-order catheterization  5.  Closure device (Mynx)     Indications: The patient has a nonhealing left foot wound.  He comes in today for angiography.  His baseline ABI on the left was 0.49 and normal on the right  Procedure:  The patient was identified in the holding area and taken to room 8.  The patient was then placed supine on the table and prepped and draped in the usual sterile fashion.  A time out was called.    Ultrasound was used to evaluate the right common femoral artery.  It was patent .  A digital ultrasound image was acquired.  A micropuncture needle was used to access the right common femoral artery under ultrasound guidance.  An 018 wire was advanced without resistance and a micropuncture sheath was placed.  The 018 wire was removed and a benson wire was placed.  The micropuncture sheath was exchanged for a 5 french sheath.  An omniflush catheter was advanced over the wire to the level of L-1.  An abdominal angiogram was obtained.  Next, using the omniflush catheter and a benson wire, the aortic bifurcation was crossed and the catheter was placed into theleft external iliac artery and left runoff was obtained.    Findings:   Aortogram: No significant renal artery stenosis was identified.  The infrarenal abdominal aorta is widely patent.  Bilateral common and external iliac arteries are widely patent.  Right Lower Extremity: Not evaluated due to the patient's creatinine as well as the inability to breathe on the table.  Left Lower Extremity: Left common femoral profundofemoral artery are widely patent.  The superficial femoral artery has a long segment of disease  from the above-knee popliteal artery up to the adductor canal.  There is a short focal occlusion.  The below-knee popliteal artery is widely patent.  Two-vessel runoff via the anterior tibial and peroneal artery are identified.  At the ankle the peroneal reconstitutes the posterior tibial.  The plantar arch is intact.  Intervention: The patient began having difficulty breathing while lying flat on his back and therefore we elected to abort the intervention portion of the procedure and have this done in the operating room under general anesthesia  Impression:  #1  No significant aortoiliac stenosis identified  #2  Short segment occlusion of the left superficial femoral artery at the adductor canal with diffuse disease of the distal superficial femoral and proximal above-knee popliteal artery.  There is two-vessel runoff via the anterior tibial and peroneal artery with reconstitution of the posterior tibial at the ankle.  #3  Due to the patient's inability to lay flat, this will need to be scheduled in the operating room under general anesthesia     V. Annamarie Major, M.D., [facility] of [place] Office: [phone] Pager:  [phone]

## 2019-03-06 NOTE — Progress Notes (Signed)
Phone call to wife with all instruction for surgery on 03/16/2019. Nasal swab at [facility] at 1300 on 03/13/2019. Hold Eliquis x 2 days pre-op. Hold Metformin pm of 03/15/2019 and am of procedure.NPO past midnight 03/15/2019. All other pre-op instructions from hospital pre-admission testing. Expect their call. To be at [facility] admitting office at 5:30 am on 03/16/2019. Verbalized understanding. To call if questions.

## 2019-03-06 NOTE — Progress Notes (Signed)
   Pt arrived from [facility] lab and was SOB with use of accessory muscles. He was on [place] at 6 L/min. With SPO2 in low to mid 80's. He was placed on PNRM, and SPO2 improved at this point to mid 90's. He was still breathing very have. Lungs were clear on ascultation, with tightness in lower lobes. Patient's condition was discussed with Dr Trula Slade, and it was decided to give him Lasix 40 mg, and a SA with Duoneb. His high BP was also treated with Labetolol IV push. Jose [name] condition was gradualy improving, and he was placed back on [place] @ 5 L/min. He stated that he feels better, and that he would like to go home.

## 2019-03-06 NOTE — Discharge Instructions (Signed)
Femoral Site Care °This sheet gives you information about how to care for yourself after your procedure. Your health care provider may also give you more specific instructions. If you have problems or questions, contact your health care provider. °What can I expect after the procedure? °After the procedure, it is common to have: °· Bruising that usually fades within 1-2 weeks. °· Tenderness at the site. °Follow these instructions at home: °Wound care °· Follow instructions from your health care provider about how to take care of your insertion site. Make sure you: °? Wash your hands with soap and water before you change your bandage (dressing). If soap and water are not available, use hand sanitizer. °? Change your dressing as told by your health care provider. °? Leave stitches (sutures), skin glue, or adhesive strips in place. These skin closures may need to stay in place for 2 weeks or longer. If adhesive strip edges start to loosen and curl up, you may trim the loose edges. Do not remove adhesive strips completely unless your health care provider tells you to do that. °· Do not take baths, swim, or use a hot tub until your health care provider approves. °· You may shower 24-48 hours after the procedure or as told by your health care provider. °? Gently wash the site with plain soap and water. °? Pat the area dry with a clean towel. °? Do not rub the site. This may cause bleeding. °· Do not apply powder or lotion to the site. Keep the site clean and dry. °· Check your femoral site every day for signs of infection. Check for: °? Redness, swelling, or pain. °? Fluid or blood. °? Warmth. °? Pus or a bad smell. °Activity °· For the first 2-3 days after your procedure, or as long as directed: °? Avoid climbing stairs as much as possible. °? Do not squat. °· Do not lift anything that is heavier than 10 lb (4.5 kg), or the limit that you are told, until your health care provider says that it is safe. °· Rest as  directed. °? Avoid sitting for a long time without moving. Get up to take short walks every 1-2 hours. °· Do not drive for 24 hours if you were given a medicine to help you relax (sedative). °General instructions °· Take over-the-counter and prescription medicines only as told by your health care provider. °· Keep all follow-up visits as told by your health care provider. This is important. °Contact a health care provider if you have: °· A fever or chills. °· You have redness, swelling, or pain around your insertion site. °Get help right away if: °· The catheter insertion area swells very fast. °· You pass out. °· You suddenly start to sweat or your skin gets clammy. °· The catheter insertion area is bleeding, and the bleeding does not stop when you hold steady pressure on the area. °· The area near or just beyond the catheter insertion site becomes pale, cool, tingly, or numb. °These symptoms may represent a serious problem that is an emergency. Do not wait to see if the symptoms will go away. Get medical help right away. Call your local emergency services (911 in the [place] Do not drive yourself to the hospital. °Summary °· After the procedure, it is common to have bruising that usually fades within 1-2 weeks. °· Check your femoral site every day for signs of infection. °· Do not lift anything that is heavier than 10 lb (4.5 kg), or the   limit that you are told, until your health care provider says that it is safe. °This information is not intended to replace advice given to you by your health care provider. Make sure you discuss any questions you have with your health care provider. °Document Released: 04/19/2014 Document Revised: 08/29/2017 Document Reviewed: 08/29/2017 °Elsevier Patient Education © 2020 [employer]vier Inc[employer] ° ° ° °Moderate Conscious Sedation, Adult, Care After °These instructions provide you with information about caring for yourself after your procedure. Your health care provider may also give you  more specific instructions. Your treatment has been planned according to current medical practices, but problems sometimes occur. Call your health care provider if you have any problems or questions after your procedure. °What can I expect after the procedure? °After your procedure, it is common: °· To feel sleepy for several hours. °· To feel clumsy and have poor balance for several hours. °· To have poor judgment for several hours. °· To vomit if you eat too soon. °Follow these instructions at home: °For at least 24 hours after the procedure: ° °· Do not: °? Participate in activities where you could fall or become injured. °? Drive. °? Use heavy machinery. °? Drink alcohol. °? Take sleeping pills or medicines that cause drowsiness. °? Make important decisions or sign legal documents. °? Take care of children on your own. °· Rest. °Eating and drinking °· Follow the diet recommended by your health care provider. °· If you vomit: °? Drink water, juice, or soup when you can drink without vomiting. °? Make sure you have little or no nausea before eating solid foods. °General instructions °· Have a responsible adult stay with you until you are awake and alert. °· Take over-the-counter and prescription medicines only as told by your health care provider. °· If you smoke, do not smoke without supervision. °· Keep all follow-up visits as told by your health care provider. This is important. °Contact a health care provider if: °· You keep feeling nauseous or you keep vomiting. °· You feel light-headed. °· You develop a rash. °· You have a fever. °Get help right away if: °· You have trouble breathing. °This information is not intended to replace advice given to you by your health care provider. Make sure you discuss any questions you have with your health care provider. °Document Released: 06/06/2013 Document Revised: 07/29/2017 Document Reviewed: 12/06/2015 °Elsevier Patient Education © 2020 Elsevier Inc. ° °

## 2019-03-06 NOTE — Progress Notes (Signed)
1400 Client's wife called to check on restart date for Eliquis; called Dr Trula Slade and per Dr Trula Slade client to resume eliquis tomorrow; client's wife instructed to resume eliquis tomorrow and hold metformin for 2 days and she voiced understanding

## 2019-03-09 ENCOUNTER — Encounter (HOSPITAL_COMMUNITY): Payer: Self-pay | Admitting: Surgery

## 2019-03-13 ENCOUNTER — Telehealth: Payer: Self-pay | Admitting: *Deleted

## 2019-03-13 ENCOUNTER — Other Ambulatory Visit: Payer: Self-pay

## 2019-03-13 ENCOUNTER — Other Ambulatory Visit (HOSPITAL_COMMUNITY)
Admission: RE | Admit: 2019-03-13 | Discharge: 2019-03-13 | Disposition: A | Discharge status: Home / Self Care | Payer: No Typology Code available for payment source | Source: Ambulatory Visit | Attending: Surgery | Admitting: Surgery

## 2019-03-13 DIAGNOSIS — Z1159 Encounter for screening for other viral diseases: Secondary | ICD-10-CM | POA: Diagnosis present

## 2019-03-13 NOTE — Telephone Encounter (Signed)
I spoke with Jose [name] at [facility]  house coverage of patient's need for family member in pre-op on Fridays surgery.All needs listed in comments with procedure booking. Patient is limited with walker and oxygen and would not speak with this staff in the office.  She instructed to have family member go to desk for screening and green arm band. I then called [name] in pre-op and left message about all of this. Family instructed.

## 2019-03-14 LAB — SARS CORONAVIRUS 2 (TAT 6-24 HRS): SARS Coronavirus 2: NEGATIVE

## 2019-03-15 ENCOUNTER — Encounter (HOSPITAL_COMMUNITY)
Admission: RE | Admit: 2019-03-15 | Discharge: 2019-03-15 | Disposition: A | Discharge status: Home / Self Care | Payer: No Typology Code available for payment source | Source: Ambulatory Visit | Attending: Surgery | Admitting: Surgery

## 2019-03-15 ENCOUNTER — Encounter (HOSPITAL_COMMUNITY): Payer: Self-pay | Admitting: Anesthesiology

## 2019-03-15 ENCOUNTER — Encounter (HOSPITAL_COMMUNITY): Payer: Self-pay

## 2019-03-15 ENCOUNTER — Encounter (HOSPITAL_COMMUNITY): Payer: Self-pay | Admitting: Vascular Surgery

## 2019-03-15 ENCOUNTER — Other Ambulatory Visit: Payer: Self-pay

## 2019-03-15 DIAGNOSIS — G4733 Obstructive sleep apnea (adult) (pediatric): Secondary | ICD-10-CM | POA: Insufficient documentation

## 2019-03-15 DIAGNOSIS — I130 Hypertensive heart and chronic kidney disease with heart failure and stage 1 through stage 4 chronic kidney disease, or unspecified chronic kidney disease: Secondary | ICD-10-CM | POA: Diagnosis not present

## 2019-03-15 DIAGNOSIS — Z7901 Long term (current) use of anticoagulants: Secondary | ICD-10-CM | POA: Insufficient documentation

## 2019-03-15 DIAGNOSIS — I509 Heart failure, unspecified: Secondary | ICD-10-CM | POA: Diagnosis not present

## 2019-03-15 DIAGNOSIS — Z79890 Hormone replacement therapy (postmenopausal): Secondary | ICD-10-CM | POA: Diagnosis not present

## 2019-03-15 DIAGNOSIS — Z87891 Personal history of nicotine dependence: Secondary | ICD-10-CM | POA: Insufficient documentation

## 2019-03-15 DIAGNOSIS — Z79899 Other long term (current) drug therapy: Secondary | ICD-10-CM | POA: Insufficient documentation

## 2019-03-15 DIAGNOSIS — K219 Gastro-esophageal reflux disease without esophagitis: Secondary | ICD-10-CM | POA: Diagnosis not present

## 2019-03-15 DIAGNOSIS — Z794 Long term (current) use of insulin: Secondary | ICD-10-CM | POA: Insufficient documentation

## 2019-03-15 DIAGNOSIS — E11622 Type 2 diabetes mellitus with other skin ulcer: Secondary | ICD-10-CM | POA: Insufficient documentation

## 2019-03-15 DIAGNOSIS — E1151 Type 2 diabetes mellitus with diabetic peripheral angiopathy without gangrene: Secondary | ICD-10-CM | POA: Insufficient documentation

## 2019-03-15 DIAGNOSIS — Z8673 Personal history of transient ischemic attack (TIA), and cerebral infarction without residual deficits: Secondary | ICD-10-CM | POA: Insufficient documentation

## 2019-03-15 DIAGNOSIS — Z9861 Coronary angioplasty status: Secondary | ICD-10-CM | POA: Insufficient documentation

## 2019-03-15 DIAGNOSIS — E669 Obesity, unspecified: Secondary | ICD-10-CM | POA: Insufficient documentation

## 2019-03-15 DIAGNOSIS — I480 Paroxysmal atrial fibrillation: Secondary | ICD-10-CM | POA: Insufficient documentation

## 2019-03-15 DIAGNOSIS — E039 Hypothyroidism, unspecified: Secondary | ICD-10-CM | POA: Diagnosis not present

## 2019-03-15 DIAGNOSIS — Z01818 Encounter for other preprocedural examination: Secondary | ICD-10-CM | POA: Diagnosis not present

## 2019-03-15 DIAGNOSIS — E785 Hyperlipidemia, unspecified: Secondary | ICD-10-CM | POA: Diagnosis not present

## 2019-03-15 DIAGNOSIS — N189 Chronic kidney disease, unspecified: Secondary | ICD-10-CM | POA: Diagnosis not present

## 2019-03-15 DIAGNOSIS — E1140 Type 2 diabetes mellitus with diabetic neuropathy, unspecified: Secondary | ICD-10-CM | POA: Insufficient documentation

## 2019-03-15 DIAGNOSIS — Z9981 Dependence on supplemental oxygen: Secondary | ICD-10-CM | POA: Insufficient documentation

## 2019-03-15 DIAGNOSIS — L98499 Non-pressure chronic ulcer of skin of other sites with unspecified severity: Secondary | ICD-10-CM | POA: Diagnosis not present

## 2019-03-15 DIAGNOSIS — I252 Old myocardial infarction: Secondary | ICD-10-CM | POA: Diagnosis not present

## 2019-03-15 DIAGNOSIS — I2510 Atherosclerotic heart disease of native coronary artery without angina pectoris: Secondary | ICD-10-CM | POA: Insufficient documentation

## 2019-03-15 DIAGNOSIS — Z6839 Body mass index (BMI) 39.0-39.9, adult: Secondary | ICD-10-CM | POA: Diagnosis not present

## 2019-03-15 DIAGNOSIS — J449 Chronic obstructive pulmonary disease, unspecified: Secondary | ICD-10-CM | POA: Insufficient documentation

## 2019-03-15 DIAGNOSIS — Z7902 Long term (current) use of antithrombotics/antiplatelets: Secondary | ICD-10-CM | POA: Diagnosis not present

## 2019-03-15 HISTORY — DX: Gastro-esophageal reflux disease without esophagitis: K21.9

## 2019-03-15 HISTORY — DX: Unspecified osteoarthritis, unspecified site: M19.90

## 2019-03-15 HISTORY — DX: Acute myocardial infarction, unspecified: I21.9

## 2019-03-15 HISTORY — DX: Pneumonia, unspecified organism: J18.9

## 2019-03-15 HISTORY — DX: Peripheral vascular disease, unspecified: I73.9

## 2019-03-15 LAB — BASIC METABOLIC PANEL
Anion gap: 12 (ref 5–15)
BUN: 26 mg/dL — ABNORMAL HIGH (ref 8–23)
CO2: 28 mmol/L (ref 22–32)
Calcium: 9 mg/dL (ref 8.9–10.3)
Chloride: 97 mmol/L — ABNORMAL LOW (ref 98–111)
Creatinine, Ser: 1.63 mg/dL — ABNORMAL HIGH (ref 0.61–1.24)
GFR calc Af Amer: 49 mL/min — ABNORMAL LOW (ref >60)
GFR calc non Af Amer: 43 mL/min — ABNORMAL LOW (ref >60)
Glucose, Bld: 183 mg/dL — ABNORMAL HIGH (ref 70–99)
Potassium: 4.2 mmol/L (ref 3.5–5.1)
Sodium: 137 mmol/L (ref 135–145)

## 2019-03-15 LAB — CBC
HCT: 41.2 % (ref 39.0–52.0)
Hemoglobin: 12.4 g/dL — ABNORMAL LOW (ref 13.0–17.0)
MCH: 27.1 pg (ref 26.0–34.0)
MCHC: 30.1 g/dL (ref 30.0–36.0)
MCV: 90.2 fL (ref 80.0–100.0)
Platelets: 206 10*3/uL (ref 150–400)
RBC: 4.57 MIL/uL (ref 4.22–5.81)
RDW: 15.3 % (ref 11.5–15.5)
WBC: 8.7 10*3/uL (ref 4.0–10.5)
nRBC: 0 % (ref 0.0–0.2)

## 2019-03-15 LAB — SURGICAL PCR SCREEN
MRSA, PCR: NEGATIVE
Staphylococcus aureus: POSITIVE — AB

## 2019-03-15 LAB — GLUCOSE, CAPILLARY: Glucose-Capillary: 180 mg/dL — ABNORMAL HIGH (ref 70–99)

## 2019-03-15 LAB — HEMOGLOBIN A1C
Hgb A1c MFr Bld: 7.6 % — ABNORMAL HIGH (ref 4.8–5.6)
Mean Plasma Glucose: 171.42 mg/dL

## 2019-03-15 MED ORDER — DEXTROSE 5 % IV SOLN
3.0000 g | INTRAVENOUS | Status: DC
  Filled 2019-03-15: qty 3000

## 2019-03-15 NOTE — Pre-Procedure Instructions (Addendum)
Jose [name]  03/15/2019    Your procedure is scheduled on Friday, March 16, 2019 at 7:30 AM.   Report to [facility] Entrance "A" Admitting Office at 5:30 AM.   Call this number if you have problems the morning of surgery: [phone]   Remember:  Do not eat or drink after midnight tonight.  Take these medicines the morning of surgery with A SIP OF WATER: Clopidogrel (Plavix), Famotidine (Pepcid), Levothyroxine (Synthroid), Metoprolol (Toprol SL), Sertraline (Zoloft), Oxycodone - if needed  Do not take Metformin tonight or in the AM. Tonight and in the AM take 1/2 of your regular dose of Lantus Insulin, you will take 30 units tonight and 27 units in the AM.  Please check blood sugar in the AM when you wake up. If blood sugar is 70 or below, treat with 1/2 cup of clear juice (apple or cranberry) and recheck blood sugar 15 minutes after drinking juice. If you have to drink the juice, please inform the nurse when you arrive.    Do not wear jewelry.  Do not wear lotions, powders, cologne or deodorant.  Men may shave face and neck.  Do not bring valuables to the hospital.  [facility] is not responsible for any belongings or valuables.  Contacts, dentures or bridgework may not be worn into surgery.  Leave your suitcase in the car.  After surgery it may be brought to your room.  For patients admitted to the hospital, discharge time will be determined by your treatment team.  Patients discharged the day of surgery will not be allowed to drive home.   [place] - Preparing for Surgery  Before surgery, you can play an important role.  Because skin is not sterile, your skin needs to be as free of germs as possible.  You can reduce the number of germs on you skin by washing with CHG (chlorahexidine gluconate) soap before surgery.  CHG is an antiseptic cleaner which kills germs and bonds with the skin to continue killing germs even after washing.  Oral Hygiene is also important in  reducing the risk of infection.  Remember to brush your teeth with your regular toothpaste the morning of surgery.  Please DO NOT use if you have an allergy to CHG or antibacterial soaps.  If your skin becomes reddened/irritated stop using the CHG and inform your nurse when you arrive at Short Stay.  Do not shave (including legs and underarms) for at least 48 hours prior to the first CHG shower.  You may shave your face.  Please follow these instructions carefully:   1.  Shower with CHG Soap the night before surgery and the morning of Surgery.  2.  If you choose to wash your hair, wash your hair first as usual with your normal shampoo.  3.  After you shampoo, rinse your hair and body thoroughly to remove the shampoo. 4.  Use CHG as you would any other liquid soap.  You can apply chg directly to the skin and wash gently with a      scrungie or washcloth.           5.  Apply the CHG Soap to your body ONLY FROM THE NECK DOWN.   Do not use on open wounds or open sores. Avoid contact with your eyes, ears, mouth and genitals (private parts).  Wash genitals (private parts) with your normal soap.  6.  Wash thoroughly, paying special attention to the area where your surgery  will be performed.  7.  Thoroughly rinse your body with warm water from the neck down.  8.  DO NOT shower/wash with your normal soap after using and rinsing off the CHG Soap.  9.  Pat yourself dry with a clean towel.            10.  Wear clean pajamas.            11.  Place clean sheets on your bed the night of your first shower and do not sleep with pets.  Day of Surgery  Shower as above.  Do not apply any lotions/deodorants the morning of surgery.   Please wear clean clothes to the hospital. Remember to brush your teeth with toothpaste.   Please read over the fact sheets that you were given.

## 2019-03-15 NOTE — Progress Notes (Signed)
PCP - Dr. Warrick Parisian Cardiologist - Dr. Harl Bowie  Chest x-ray - 12/19/18 EKG - 12/19/18 Stress Test -  ECHO - 07/14/17 Cardiac Cath - 07/14/17  Sleep Study - unknown CPAP - yes  Fasting Blood Sugar - 130-220 Checks Blood Sugar ___1__ times a day  Blood Thinner Instructions: to hold Eliquis 2 day prior to surgery, last dose 03/13/19. Pt to continue Plavix Aspirin Instructions: N/A  Anesthesia review: Yes - labs   Patient denies shortness of breath, fever, cough and chest pain at PAT appointment   Patient verbalized understanding of instructions that were given to them at the PAT appointment. Patient was also instructed that they will need to review over the PAT instructions again at home before surgery.   Patient had Covid 19 testing done 03/13/19. It was negative. He states he had been quarantined at home, wearing mask today for appt.   Coronavirus Screening  Have you experienced the following symptoms:  Cough NO Fever (>100.66F)  NO Runny nose NO Sore throat NO Difficulty breathing/shortness of breath  NO  Have you or a family member traveled in the last 14 days and where? NO  Patient and wife reminded that hospital visitation restrictions are in effect and the importance of the restrictions.

## 2019-03-15 NOTE — Progress Notes (Signed)
Anesthesia Chart Review:  Case: [id number] Date/Time: 03/16/19 0715   Procedure: AORTOGRAM BILATERAL LOWER EXTREMITY RUNOFF POSSIBLE INTERVENTION (N/A )   Anesthesia type: General   Pre-op diagnosis: PERIPERAL VASCULAR DISEASE WITH ULCER   Location: [facility] OR ROOM 16 / [place] OR   Surgeon: Serafina Mitchell, MD      DISCUSSION: Patient is a 67 year old male scheduled for the above procedure. He had attempted peripheral intervention in the PV lab on 03/06/19, but he could not tolerate lying flat so procedure rescheduled in [place] with general anesthesia.  History includes DM2, diastolic CHF, CAD (occluded RCA filled by collaterals, unsuccessful PCI 08/28/14; 07/14/17: distal LM/ostial LAD 70%, mid-distal LAD 1005, ostial LCX 70%, unsuccessful PTCA of mid-distal LAD. Consider CABG if refractory symptoms), PAF, OSA, HTN, obesity, hypothyroidism, PAD, CKD, CVA (02/2016, s/p tPA; 06/2017), carotid artery disease (LICA occlusion), COPD, home oxygen (2L), AAA (3.0 cm 06/14/16, 3 year follow-up rec.). BMI is consistent with obesity. - Admitted 12/19/18-12/22/18 with LLE cellulitis with ulcer and edema and mild decompensated chronic diastolic HF. LLE venous Doppler negative for DVT.  Given antibiotics and IV Lasix.  Wound care consulted. Out-patient vascular surgery follow-up planned.  Last seen by cardiologist Dr. Harl Bowie on 8/41/66 for diastolic CHF and CAD follow-up. Overall, appeared to be stable with mild edema, no chest pain.    He reported instructions to continue Plavix but hold Eliquis 2 days prior to surgery, last dose 03/13/2019.  Creatinine 1.63, BUN 26. Previously Cr 1.50, BUN 29 on 03/06/19 with Cr range 1.33-1.63 since 04/10/18 (most often ~ 1.45-1.50 range). Called to Dr. Trula Slade.  Reviewed above with anesthesiologist Oren Bracket, MD. Would recommend preoperative cardiology evaluation. Consider same day evaluation versus postponing procedure. Options discussed with Dr. Trula Slade. He spoke with  cardiology. Ultimately, case may be postponed with out-patient cardiology follow-up. Dr. Trula Slade will contact patient to discuss and let OR know if case will be cancelled. If case remains as scheduled then cardiology will need to be paged for consult.    VS: BP (!) 179/78   Pulse 89   Temp 36.5 C   Resp (!) 22   Ht 6' (1.829 m)   Wt 132 kg   SpO2 93%   BMI 39.47 kg/m     PROVIDERS: Dettinger, Fransisca Kaufmann, MD is PCP. Last visit 02/20/19 for follow-up cellulitis left foot.  Carlyle Dolly, MD is cardiologist. Last visit 03/20/18. Six month follow-up recommended.    LABS: Preoperative labs noted.  (all labs ordered are listed, but only abnormal results are displayed)  Labs Reviewed  SURGICAL PCR SCREEN - Abnormal; Notable for the following components:      Result Value   Staphylococcus aureus POSITIVE (*)    All other components within normal limits  GLUCOSE, CAPILLARY - Abnormal; Notable for the following components:   Glucose-Capillary 180 (*)    All other components within normal limits  CBC - Abnormal; Notable for the following components:   Hemoglobin 12.4 (*)    All other components within normal limits  BASIC METABOLIC PANEL - Abnormal; Notable for the following components:   Chloride 97 (*)    Glucose, Bld 183 (*)    BUN 26 (*)    Creatinine, Ser 1.63 (*)    GFR calc non Af Amer 43 (*)    GFR calc Af Amer 49 (*)    All other components within normal limits  HEMOGLOBIN A1C     IMAGES: CXR 12/19/18: FINDINGS: There is no appreciable edema  or consolidation. There is cardiomegaly with pulmonary vascularity within normal limits. No adenopathy. There is mild degenerative change in thoracic spine. IMPRESSION: Stable cardiac prominence.  No edema or consolidation.   EKG: 12/19/18: Sinus rhythm Nonspecific intraventricular conduction delay Probable inferior infarct, old Baseline wander in lead(s) II III aVF V3 V5 V6 Abnormal ekg Confirmed by Carmin Muskrat  [phone]) on 12/19/2018 2:11:25 PM   CV: Aortogram with BLE runoff 03/06/19: Findings:  - Aortogram: No significant renal artery stenosis was identified.  The infrarenal abdominal aorta is widely patent.  Bilateral common and external iliac arteries are widely patent. - Right Lower Extremity: Not evaluated due to the patient's creatinine as well as the inability to breathe on the table. - Left Lower Extremity: Left common femoral profundofemoral artery are widely patent.  The superficial femoral artery has a long segment of disease from the above-knee popliteal artery up to the adductor canal.  There is a short focal occlusion.  The below-knee popliteal artery is widely patent.  Two-vessel runoff via the anterior tibial and peroneal artery are identified.  At the ankle the peroneal reconstitutes the posterior tibial.  The plantar arch is intact. Intervention: The patient began having difficulty breathing while lying flat on his back and therefore we elected to abort the intervention portion of the procedure and have this done in the operating room under general anesthesia Impression: #1  No significant aortoiliac stenosis identified #2  Short segment occlusion of the left superficial femoral artery at the adductor canal with diffuse disease of the distal superficial femoral and proximal above-knee popliteal artery.  There is two-vessel runoff via the anterior tibial and peroneal artery with reconstitution of the posterior tibial at the ankle. #3  Due to the patient's inability to lay flat, this will need to be scheduled in the operating room under general anesthesia   Carotid US 01/04/18: Final Interpretation: Right Carotid: Velocities in the right ICA are consistent with a 1-39% stenosis. Left Carotid: Velocities in the left ICA are consistent with a total occlusion. Vertebrals:  Left vertebral artery demonstrates antegrade flow. Right vertebral artery was not visualized. Subclavians: Normal flow  hemodynamics were seen in bilateral subclavian arteries.   Echo 07/14/17: Study Conclusions - Left ventricle: LVEF is approximately 40% with akinesis of the   distal inferoseptal wall and apex. Aneurysmal dilatation of apex.   Consider limited echo with contrast to evaluate for thrombus. The   cavity size was normal. Wall thickness was increased in a pattern   of mild LVH. - Mitral valve: Calcified annulus. Mildly thickened leaflets . (Comparison 02/26/16: LVEF 55-60%, grade I DD)   Cardiac cath 07/14/17:  Dist LM to Ost LAD lesion is 70% stenosed.  Prox LAD to Mid LAD lesion is 30% stenosed.  Mid LAD to Dist LAD lesion is 100% stenosed.  Ost Cx lesion is 70% stenosed.  Prox RCA lesion is 100% stenosed.  LV end diastolic pressure is moderately elevated.  Balloon angioplasty was performed using a BALLOON EUPHORA [id number].  Post intervention, there is a 100% residual stenosis. 1. 3 vessel obstructive CAD.    - culprit lesion is occlusion of distal LAD. Unsuccessful attempt at PTCA- unable to restore flow despite balloon angioplasty.    - 70% ostial LAD with concentric calcified plaque. Minimal lumen area 5.2 mm squared by IVUS    - approximately 70% ostial LCx disease. Unable to cross with IVUS catheter.    - CTO of the proximal RCA with good left to right collaterals  2. Moderately elevated LVEDP 3. Persistent Afib  Plan: aggressive medical management. Assess LV functio by Echo. It is unclear the significance of the ostial LAD and LCx. If he has refractory angina I would consider referral for CABG. If symptoms improve I would consider ischemic work up with a stress myoview in the near future to assess need for further revascularization.   Past Medical History:  Diagnosis Date  . Anemia   . Anxiety   . Arthritis    back  . CAD (coronary artery disease)    a. prior silent inferior MI, NSTEMI 07/2014 with chronically occluded RCA s/p unsuccessful PCI. b. STEMI 07/2017 with  multivessel disease s/p failed PTCA of distal LAD.  [name] Kitchen Carotid artery disease ([place])   . Cataract   . Chronic combined systolic and diastolic CHF (congestive heart failure) ([place])   . Chronic respiratory failure ([place])    a. Placed on home O2 01/2016.  [name] Kitchen CKD (chronic kidney disease), stage III ([place])   . COPD (chronic obstructive pulmonary disease) ([place])   . Diabetes mellitus ([place])   . Former tobacco use   . GERD (gastroesophageal reflux disease)   . Hyperlipidemia   . Hypertension   . Hyponatremia   . Morbid obesity ([place])   . Myocardial infarction ([place])   . Neuropathy   . PAF (paroxysmal atrial fibrillation) ([place])    a. dx 06/2017.  [name] Kitchen Peripheral vascular disease ([place])   . Pneumonia   . Sleep apnea   . Stroke ([place]) 06/2017  . TIA (transient ischemic attack) 2017   a. hemispheric L carotid artery syndrome in setting of hypotension.    Past Surgical History:  Procedure Laterality Date  . ABDOMINAL AORTOGRAM W/LOWER EXTREMITY Left 03/06/2019   Procedure: ABDOMINAL AORTOGRAM W/LOWER EXTREMITY;  Surgeon: Serafina Mitchell, MD;  Location: [place] [facility];  Service: Cardiovascular;  Laterality: Left;  . BACK SURGERY     lumbar  . CORONARY BALLOON ANGIOPLASTY N/A 07/14/2017   Procedure: CORONARY BALLOON ANGIOPLASTY;  Surgeon: Martinique, Peter M, MD;  Location: [place] [facility];  Service: Cardiovascular;  Laterality: N/A;  . EYE SURGERY Bilateral    cataract surgery with lens implants  . INTRAVASCULAR ULTRASOUND/IVUS N/A 07/14/2017   Procedure: Intravascular Ultrasound/IVUS;  Surgeon: Martinique, Peter M, MD;  Location: [place] [facility];  Service: Cardiovascular;  Laterality: N/A;  . LEFT HEART CATH AND CORONARY ANGIOGRAPHY N/A 07/14/2017   Procedure: LEFT HEART CATH AND CORONARY ANGIOGRAPHY;  Surgeon: Martinique, Peter M, MD;  Location: [place] [facility];  Service: Cardiovascular;  Laterality: N/A;  . LEFT HEART CATHETERIZATION WITH CORONARY ANGIOGRAM N/A 08/28/2014   Procedure: LEFT HEART  CATHETERIZATION WITH CORONARY ANGIOGRAM;  Surgeon: Clent Demark, MD;  Location: [facility] CATH LAB;  Service: Cardiovascular;  Laterality: N/A;  . PERIPHERAL VASCULAR CATHETERIZATION Bilateral 03/22/2016   Procedure: Carotid Angiography;  Surgeon: Elam Dutch, MD;  Location: [place] [facility] LAB;  Service: Cardiovascular;  Laterality: Bilateral;  . TONSILLECTOMY      MEDICATIONS: . aluminum hydroxide-magnesium carbonate (GAVISCON) 95-358 MG/15ML SUSP  . apixaban (ELIQUIS) 5 MG TABS tablet  . Carboxymethylcellul-Glycerin (LUBRICATING EYE DROPS OP)  . clopidogrel (PLAVIX) 75 MG tablet  . ergocalciferol (VITAMIN D2) 50000 UNITS capsule  . ezetimibe (ZETIA) 10 MG tablet  . famotidine (PEPCID) 20 MG tablet  . ferrous sulfate 325 (65 FE) MG tablet  . furosemide (LASIX) 40 MG tablet  . insulin aspart (NOVOLOG) 100 UNIT/ML injection  . insulin glargine (LANTUS) 100 UNIT/ML  injection  . isosorbide mononitrate (IMDUR) 30 MG 24 hr tablet  . levothyroxine (SYNTHROID) 50 MCG tablet  . magnesium oxide (MAG-OX) 400 MG tablet  . metFORMIN (GLUCOPHAGE) 1000 MG tablet  . metoprolol succinate (TOPROL-XL) 25 MG 24 hr tablet  . Multiple Vitamins-Minerals (ICAPS AREDS 2) CAPS  . nitroGLYCERIN (NITROSTAT) 0.4 MG SL tablet  . Omega-3 Fatty Acids (FISH OIL) 1000 MG CAPS  . OVER THE COUNTER MEDICATION  . oxyCODONE (OXY IR/ROXICODONE) 5 MG immediate release tablet  . sertraline (ZOLOFT) 100 MG tablet  . tiotropium (SPIRIVA) 18 MCG inhalation capsule  . vitamin B-12 (CYANOCOBALAMIN) 1000 MCG tablet   No current facility-administered medications for this encounter.    Derrill Memo ON 03/16/2019] ceFAZolin (ANCEF) 3 g in dextrose 5 % 50 mL IVPB    Myra Gianotti, PA-C Surgical Short Stay/Anesthesiology [facility] Phone [phone] [facility] Phone [phone] 03/15/2019 5:15 PM

## 2019-03-15 NOTE — Anesthesia Preprocedure Evaluation (Deleted)
Anesthesia Evaluation    Airway        Dental   Pulmonary former smoker,           Cardiovascular hypertension,      Neuro/Psych    GI/Hepatic   Endo/Other  diabetes  Renal/GU      Musculoskeletal   Abdominal   Peds  Hematology   Anesthesia Other Findings   Reproductive/Obstetrics                             Anesthesia Physical Anesthesia Plan  ASA:   Anesthesia Plan:    Post-op Pain Management:    Induction:   PONV Risk Score and Plan:   Airway Management Planned:   Additional Equipment:   Intra-op Plan:   Post-operative Plan:   Informed Consent:   Plan Discussed with:   Anesthesia Plan Comments: (See PAT note written by Myra Gianotti, PA-C. Needs preoperative cardiology evaluation. )       Anesthesia Quick Evaluation

## 2019-03-16 ENCOUNTER — Encounter (HOSPITAL_COMMUNITY): Admission: RE | Payer: Self-pay | Source: Home / Self Care

## 2019-03-16 ENCOUNTER — Ambulatory Visit: Payer: Medicare HMO

## 2019-03-16 ENCOUNTER — Ambulatory Visit (HOSPITAL_COMMUNITY): Admission: RE | Admit: 2019-03-16 | Payer: Non-veteran care | Source: Home / Self Care | Admitting: Surgery

## 2019-03-16 SURGERY — AORTOGRAM
Anesthesia: General

## 2019-03-19 ENCOUNTER — Other Ambulatory Visit: Payer: Self-pay

## 2019-03-20 ENCOUNTER — Ambulatory Visit (INDEPENDENT_AMBULATORY_CARE_PROVIDER_SITE_OTHER): Payer: Medicare HMO | Admitting: Family Medicine

## 2019-03-20 ENCOUNTER — Encounter: Payer: Self-pay | Admitting: Family Medicine

## 2019-03-20 VITALS — BP 151/70 | HR 100 | Temp 97.3°F | Ht 73.0 in | Wt 297.8 lb

## 2019-03-20 DIAGNOSIS — L97421 Non-pressure chronic ulcer of left heel and midfoot limited to breakdown of skin: Secondary | ICD-10-CM

## 2019-03-20 DIAGNOSIS — E11621 Type 2 diabetes mellitus with foot ulcer: Secondary | ICD-10-CM

## 2019-03-20 MED ORDER — FREESTYLE LIBRE 14 DAY SENSOR MISC: 1.0000 | 3 refills | Status: AC

## 2019-03-20 MED ORDER — FREESTYLE LIBRE 14 DAY READER DEVI: 1.0000 | Freq: Four times a day (QID) | 1 refills | Status: AC

## 2019-03-20 NOTE — Progress Notes (Signed)
BP (!) 151/70   Pulse 100   Temp (!) 97.3 F (36.3 C) (Other (Comment))   Ht 6\' 1"  (1.854 m)   Wt 297 lb 12.8 oz (135.1 kg)   BMI 39.29 kg/m    Subjective:   Patient ID: Jose [name], male    DOB: June 07, 1951, 68 y.o.   MRN: [id number]  HPI: Jose [name] is a 68 y.o. male presenting on 03/20/2019 for diabetic ulcer of left foot (4 week re check )   HPI Patient is coming in today for a recheck of his diabetic left foot ulcer that is on the lateral aspect of his left foot.  He has been using wound care virtually and has been using Santyl and Mepilex on it and it does appear to be healing but is just taking time.  He was seen 4 weeks ago for this and it did have some depth to it in 1 or 2 spots but that is gone and there is no erythema or drainage significantly that his wife is noticed.  He denies any pain with it but that is also because he has no sensation in his feet.  Relevant past medical, surgical, family and social history reviewed and updated as indicated. Interim medical history since our last visit reviewed. Allergies and medications reviewed and updated.  Review of Systems  Constitutional: Negative for chills and fever.  Respiratory: Negative for shortness of breath and wheezing.   Cardiovascular: Negative for chest pain and leg swelling.  Musculoskeletal: Negative for back pain and gait problem.  Skin: Positive for wound. Negative for color change and rash.  All other systems reviewed and are negative.   Per HPI unless specifically indicated above      Objective:   BP (!) 151/70   Pulse 100   Temp (!) 97.3 F (36.3 C) (Other (Comment))   Ht 6\' 1"  (1.854 m)   Wt 297 lb 12.8 oz (135.1 kg)   BMI 39.29 kg/m   Wt Readings from Last 3 Encounters:  03/20/19 297 lb 12.8 oz (135.1 kg)  03/15/19 291 lb (132 kg)  03/06/19 290 lb (131.5 kg)    Physical Exam Vitals signs and nursing note reviewed.  Constitutional:      General: He is not in acute distress.  Appearance: He is well-developed. He is not diaphoretic.  Eyes:     General: No scleral icterus.    Conjunctiva/sclera: Conjunctivae normal.  Neck:     Thyroid: No thyromegaly.  Skin:    General: Skin is warm and dry.     Findings: Wound (Wound on left lateral midfoot, 1.5 cm in diameter, good pink base very superficial, no depth or tunneling.  No erythema) present. No rash.  Neurological:     Mental Status: He is alert.  Psychiatric:        Behavior: Behavior normal.       Assessment & Plan:   Problem List Items Addressed This Visit      Endocrine   Diabetic ulcer of left foot associated with type 2 diabetes mellitus ([place]) - Primary    Continue to work with wound care, will see back in 4 weeks for recheck, continuing to use Santyl and Mepilex  Follow up plan: Return in about 4 weeks (around 04/17/2019), or if symptoms worsen or fail to improve, for Wound recheck diabetic foot.  Counseling provided for all of the vaccine components No orders of the defined types were placed in this encounter.   Vonna Kotyk  , MD [facility] 03/20/2019, 1:27 PM

## 2019-03-20 NOTE — Addendum Note (Signed)
Addended by: Caryl Pina on: 03/20/2019 01:33 PM   Modules accepted: Orders

## 2019-03-23 DIAGNOSIS — J449 Chronic obstructive pulmonary disease, unspecified: Secondary | ICD-10-CM | POA: Diagnosis not present

## 2019-04-02 ENCOUNTER — Telehealth: Payer: Self-pay | Admitting: *Deleted

## 2019-04-02 NOTE — Telephone Encounter (Signed)
Left a voice mail message for patient and or wife to call me back to discuss Cardiac Clearance prior to procedure with Dr. Trula Slade

## 2019-04-03 ENCOUNTER — Telehealth: Payer: Self-pay | Admitting: *Deleted

## 2019-04-03 NOTE — Telephone Encounter (Signed)
Spoke with Jarrett Soho at Dr. Branch/Cardiology office appointment scheduled for 04/24/2019 at 2pm.for evaluation/cardiac clearance for pending procedure. Clearance form faxed to office. Information about appointment given to Mrs. Kandice Robinsons. She states patient's leg wounds are improving and she will follow up with Cardiology.

## 2019-04-04 ENCOUNTER — Other Ambulatory Visit: Payer: Self-pay

## 2019-04-04 ENCOUNTER — Ambulatory Visit (INDEPENDENT_AMBULATORY_CARE_PROVIDER_SITE_OTHER): Payer: Medicare HMO | Admitting: *Deleted

## 2019-04-04 VITALS — Ht 73.0 in | Wt 297.8 lb

## 2019-04-04 DIAGNOSIS — Z0000 Encounter for general adult medical examination without abnormal findings: Secondary | ICD-10-CM | POA: Diagnosis not present

## 2019-04-04 NOTE — Progress Notes (Signed)
MEDICARE ANNUAL WELLNESS VISIT  04/04/2019  Telephone Visit Disclaimer This Medicare AWV was conducted by telephone due to national recommendations for restrictions regarding the COVID-19 Pandemic (e.g. social distancing).  I verified, using two identifiers, that I am speaking with Jose [name] or their authorized healthcare agent. I discussed the limitations, risks, security, and privacy concerns of performing an evaluation and management service by telephone and the potential availability of an in-person appointment in the future. The patient expressed understanding and agreed to proceed.   Subjective:  Jose [name] is a 68 y.o. male patient of Jose [name] who had a Medicare Annual Wellness Visit today via telephone. Jose [name] is Retired and lives with their spouse. he has 2 children. he reports that he is socially active and does interact with friends/family regularly. he is not physically active and enjoys watching TV.  Patient Care Team: Jose [name] as PCP - General (Family Medicine) Jose Lenis, [name] as PCP - Cardiology (Cardiology)  Advanced Directives 04/04/2019 03/15/2019 03/06/2019 02/26/2019 12/19/2018 07/18/2017 07/14/2017  Does Patient Have a Medical Advance Directive? No No No No No - No  Would patient like information on creating a medical advance directive? Yes (MAU/Ambulatory/Procedural Areas - Information given) No - Guardian declined No - Patient declined No - Patient declined No - Patient declined No - Patient declined -    Hospital Utilization Over the Past 12 Months: # of hospitalizations or ER visits: 1 # of surgeries: 0  Review of Systems    Patient reports that his overall health is unchanged compared to last year.  Patient Reported Readings (BP, Pulse, CBG, Weight, etc) none  Review of Systems: History obtained from chart review and the patient General ROS: negative Musculoskeletal ROS: positive for - pain in back - lower  All  other systems negative.  Pain Assessment Pain : 0-10 Pain Type: Chronic pain Pain Location: Back Pain Orientation: Lower Pain Descriptors / Indicators: Constant, Aching Pain Onset: More than a month ago Pain Frequency: Constant Pain Relieving Factors: Oxycodone  Pain Relieving Factors: Oxycodone  Current Medications & Allergies (verified) Allergies as of 04/04/2019      Reactions   Amiodarone    Weakness during 2018 admission   Atorvastatin    Tremors   Simvastatin    Muscle weakness      Medication List       Accurate as of April 04, 2019  2:54 PM. If you have any questions, ask your nurse or doctor.        apixaban 5 MG Tabs tablet Commonly known as: ELIQUIS Take 1 tablet (5 mg total) by mouth 2 (two) times daily.   clopidogrel 75 MG tablet Commonly known as: PLAVIX Take 1 tablet (75 mg total) by mouth daily.   ergocalciferol 1.25 MG (50000 UT) capsule Commonly known as: VITAMIN D2 Take 50,000 Units by mouth 2 (two) times a week. wednesdays and saturdays   ezetimibe 10 MG tablet Commonly known as: ZETIA Take 1 tablet (10 mg total) by mouth daily.   famotidine 20 MG tablet Commonly known as: Pepcid Take 1 tablet (20 mg total) by mouth every morning.   ferrous sulfate 325 (65 FE) MG tablet Take 1 tablet (325 mg total) by mouth daily with breakfast.   Fish Oil 1000 MG Caps Take 2,000-3,000 mg by mouth See admin instructions. Take 3000 mg in the morning, 2000 mg at noon, and 3000 mg in the evening   FreeStyle Libre 14  Day Reader Kerrin Mo 1 each by Does not apply route 4 (four) times daily.   FreeStyle Libre 14 Day Sensor Misc 1 each by Does not apply route every 14 (fourteen) days.   furosemide 40 MG tablet Commonly known as: LASIX Take 40-60 mg by mouth See admin instructions. Takes 40mg  twice daily. For fluid increase, take additional one-half tablet in the morning as needed for swelling   Gaviscon 95-358 MG/15ML Susp Generic drug: aluminum  hydroxide-magnesium carbonate Take 30 mLs by mouth daily as needed for indigestion or heartburn.   ICaps Areds 2 Caps Take 1 capsule by mouth 2 (two) times daily.   insulin aspart 100 UNIT/ML injection Commonly known as: novoLOG Inject 10 Units into the skin 3 (three) times daily before meals. If you eat 50% or more of meal What changed: how much to take   insulin glargine 100 UNIT/ML injection Commonly known as: LANTUS 35 units in the AM and 35 units in the PM What changed:   how much to take  how to take this  when to take this  additional instructions   isosorbide mononitrate 30 MG 24 hr tablet Commonly known as: IMDUR Take 1 tablet (30 mg total) by mouth daily.   levothyroxine 50 MCG tablet Commonly known as: SYNTHROID TAKE 1 TABLET BY MOUTH ONCE DAILY BEFORE BREAKFAST What changed:   how much to take  how to take this  when to take this  additional instructions   LUBRICATING EYE DROPS OP Place 1 drop into both eyes daily as needed (dry eyes).   magnesium oxide 400 MG tablet Commonly known as: MAG-OX Take 1 tablet (400 mg total) by mouth daily.   metFORMIN 1000 MG tablet Commonly known as: GLUCOPHAGE Take 1 tablet (1,000 mg total) by mouth 2 (two) times daily with a meal.   metoprolol succinate 25 MG 24 hr tablet Commonly known as: TOPROL-XL Take 1 tablet (25 mg total) by mouth daily.   nitroGLYCERIN 0.4 MG SL tablet Commonly known as: NITROSTAT Place 1 tablet (0.4 mg total) under the tongue every 5 (five) minutes x 3 doses as needed for chest pain.   OVER THE COUNTER MEDICATION Apply 1 application topically daily as needed (wound care). Medihoney otc wound care gel   oxyCODONE 5 MG immediate release tablet Commonly known as: Oxy IR/ROXICODONE Take 5-10 mg by mouth every 6 (six) hours as needed for severe pain.   sertraline 100 MG tablet Commonly known as: ZOLOFT Take 100 mg by mouth daily.   tiotropium 18 MCG inhalation capsule Commonly  known as: SPIRIVA Place 18 mcg into inhaler and inhale at bedtime.   vitamin B-12 1000 MCG tablet Commonly known as: CYANOCOBALAMIN Take 1,000 mcg by mouth daily.       History (reviewed): Past Medical History:  Diagnosis Date  . Anemia   . Anxiety   . Arthritis    back  . CAD (coronary artery disease)    a. prior silent inferior MI, NSTEMI 07/2014 with chronically occluded RCA s/p unsuccessful PCI. b. STEMI 07/2017 with multivessel disease s/p failed PTCA of distal LAD.  [name] Kitchen Carotid artery disease ([place])   . Cataract   . Chronic combined systolic and diastolic CHF (congestive heart failure) ([place])   . Chronic respiratory failure ([place])    a. Placed on home O2 01/2016.  [name] Kitchen CKD (chronic kidney disease), stage III ([place])   . COPD (chronic obstructive pulmonary disease) ([place])   . Diabetes mellitus ([place])   . Former tobacco use   .  GERD (gastroesophageal reflux disease)   . Hyperlipidemia   . Hypertension   . Hyponatremia   . Morbid obesity ([place])   . Myocardial infarction ([place])   . Neuropathy   . PAF (paroxysmal atrial fibrillation) ([place])    a. dx 06/2017.  [name] Kitchen Peripheral vascular disease ([place])   . Pneumonia   . Sleep apnea   . Stroke ([place]) 06/2017  . TIA (transient ischemic attack) 2017   a. hemispheric L carotid artery syndrome in setting of hypotension.   Past Surgical History:  Procedure Laterality Date  . ABDOMINAL AORTOGRAM W/LOWER EXTREMITY Left 03/06/2019   Procedure: ABDOMINAL AORTOGRAM W/LOWER EXTREMITY;  Surgeon: Serafina Mitchell, [name];  Location: [facility];  Service: Cardiovascular;  Laterality: Left;  . BACK SURGERY     lumbar  . CORONARY BALLOON ANGIOPLASTY N/A 07/14/2017   Procedure: CORONARY BALLOON ANGIOPLASTY;  Surgeon: Martinique, Peter M, [name];  Location: [facility];  Service: Cardiovascular;  Laterality: N/A;  . EYE SURGERY Bilateral    cataract surgery with lens implants  . INTRAVASCULAR ULTRASOUND/IVUS N/A 07/14/2017   Procedure: Intravascular  Ultrasound/IVUS;  Surgeon: Martinique, Peter M, [name];  Location: [facility];  Service: Cardiovascular;  Laterality: N/A;  . LEFT HEART CATH AND CORONARY ANGIOGRAPHY N/A 07/14/2017   Procedure: LEFT HEART CATH AND CORONARY ANGIOGRAPHY;  Surgeon: Martinique, Peter M, [name];  Location: [facility];  Service: Cardiovascular;  Laterality: N/A;  . LEFT HEART CATHETERIZATION WITH CORONARY ANGIOGRAM N/A 08/28/2014   Procedure: LEFT HEART CATHETERIZATION WITH CORONARY ANGIOGRAM;  Surgeon: Clent Demark, [name];  Location: [facility];  Service: Cardiovascular;  Laterality: N/A;  . PERIPHERAL VASCULAR CATHETERIZATION Bilateral 03/22/2016   Procedure: Carotid Angiography;  Surgeon: Elam Dutch, [name];  Location: [facility];  Service: Cardiovascular;  Laterality: Bilateral;  . TONSILLECTOMY     Family History  Problem Relation Age of Onset  . Hypertension Father   . Heart disease Father   . Cancer Mother        uterine  . Diabetes Mother   . Diabetes Brother   . Cancer Brother        lymphoma  . Diabetes Brother    Social History   Socioeconomic History  . Marital status: Married    Spouse name: Jacqlyn Larsen  . Number of children: 2  . Years of education: Not on file  . Highest education level: Some college, no degree  Occupational History  . Occupation: Retired  Scientific laboratory technician  . Financial resource strain: Not hard at all  . Food insecurity    Worry: Never true    Inability: Never true  . Transportation needs    Medical: No    Non-medical: No  Tobacco Use  . Smoking status: Former Smoker    Packs/day: 3.00    Years: 45.00    Pack years: 135.00    Types: Cigarettes    Quit date: 08/31/2007    Years since quitting: 11.6  . Smokeless tobacco: Never Used  Substance and Sexual Activity  . Alcohol use: No  . Drug use: No  . Sexual activity: Not on file  Lifestyle  . Physical activity    Days per week: 0 days    Minutes per session: 0 min  . Stress: Not at all  Relationships  .  Social connections    Talks on phone: More than three times a week    Gets together: More than three times a week  Attends religious service: More than 4 times per year    Active member of club or organization: Yes    Attends meetings of clubs or organizations: More than 4 times per year    Relationship status: Married  Other Topics Concern  . Not on file  Social History Narrative  . Not on file    Activities of Daily Living In your present state of health, do you have any difficulty performing the following activities: 04/04/2019 03/15/2019  Hearing? Tempie Donning  Comment Wears Hearing Aids usually wears hearing aids  Vision? Y Y  Comment - has retinopathy  Difficulty concentrating or making decisions? N N  Walking or climbing stairs? Y Y  Dressing or bathing? N Y  Doing errands, shopping? N N  Preparing Food and eating ? N -  Using the Toilet? N -  In the past six months, have you accidently leaked urine? N -  Do you have problems with loss of bowel control? N -  Managing your Medications? N -  Managing your Finances? N -  Housekeeping or managing your Housekeeping? N -  Some recent data might be hidden    Patient Education/ Literacy How often do you need to have someone help you when you read instructions, pamphlets, or other written materials from your doctor or pharmacy?: 1 - Never What is the last grade level you completed in school?: Some College  Exercise Current Exercise Habits: The patient does not participate in regular exercise at present, Exercise limited by: orthopedic condition(s)  Diet Patient reports consuming 2 meals a day and 2 snack(s) a day Patient reports that his primary diet is: Regular Patient reports that she does have regular access to food.   Depression Screen PHQ 2/9 Scores 04/04/2019 02/20/2019 09/20/2018 09/15/2018 07/11/2018 04/10/2018 12/27/2017  PHQ - 2 Score 0 0 0 0 0 0 0     Fall Risk Fall Risk  04/04/2019 02/20/2019 09/20/2018 09/15/2018 07/11/2018   Falls in the past year? 1 0 0 0 0  Number falls in past yr: 0 - - - -  Injury with Fall? 0 - - - -  Risk for fall due to : History of fall(s);Impaired balance/gait;Impaired mobility - - - -     Objective:  Jose [name] seemed alert and oriented and he participated appropriately during our telephone visit.  Blood Pressure Weight BMI  BP Readings from Last 3 Encounters:  03/20/19 (!) 151/70  03/15/19 (!) 179/78  03/06/19 (!) 167/90   Wt Readings from Last 3 Encounters:  04/04/19 297 lb 13.5 oz (135.1 kg)  03/20/19 297 lb 12.8 oz (135.1 kg)  03/15/19 291 lb (132 kg)   BMI Readings from Last 1 Encounters:  04/04/19 39.30 kg/m    *Unable to obtain current vital signs, weight, and BMI due to telephone visit type  Hearing/Vision  . Keldric did not seem to have difficulty with hearing/understanding during the telephone conversation . Reports that he has had a formal eye exam by an eye care professional within the past year . Reports that he has not had a formal hearing evaluation within the past year *Unable to fully assess hearing and vision during telephone visit type  Cognitive Function: 6CIT Screen 04/04/2019  What Year? 0 points  What month? 0 points  What time? 0 points  Count back from 20 0 points  Months in reverse 0 points  Repeat phrase 0 points  Total Score 0   (Normal:0-7, Significant for Dysfunction: >8)  Normal  Cognitive Function Screening: Yes   Immunization & Health Maintenance Record Immunization History  Administered Date(s) Administered  . Influenza, High Dose Seasonal PF 07/11/2018    Health Maintenance  Topic Date Due  . Hepatitis C Screening  1951/02/12  . OPHTHALMOLOGY EXAM  01/07/1961  . TETANUS/TDAP  01/07/1970  . COLONOSCOPY  10/03/2012  . PNA vac Low Risk Adult (1 of 2 - PCV13) 01/08/2016  . URINE MICROALBUMIN  12/28/2018  . INFLUENZA VACCINE  03/31/2019  . FOOT EXAM  04/11/2019  . HEMOGLOBIN A1C  09/15/2019       Assessment  This is  a routine wellness examination for Jose [name].  Health Maintenance: Due or Overdue Health Maintenance Due  Topic Date Due  . Hepatitis C Screening  [birth year]  . OPHTHALMOLOGY EXAM  01/07/1961  . TETANUS/TDAP  01/07/1970  . COLONOSCOPY  10/03/2012  . PNA vac Low Risk Adult (1 of 2 - PCV13) 01/08/2016  . URINE MICROALBUMIN  12/28/2018  . INFLUENZA VACCINE  03/31/2019    Jose [name] does not need a referral for Community Assistance: Care Management:   no Social Work:    no Prescription Assistance:  no Nutrition/Diabetes Education:  no   Plan:  Personalized Goals Goals Addressed            This Visit's Progress   . Have 3 meals a day       Eat 3 meals daily that consist of lean proteins, fruits and vegetables Increase Water intake  Limit sugary foods and drinks      Personalized Health Maintenance & Screening Recommendations  Td vaccine Colorectal cancer screening Diabetes screening Glaucoma screening Advanced directives: has NO advanced directive  - add't info requested. Referral to SW: no Shingrix  Lung Cancer Screening Recommended: no (Low Dose CT Chest recommended if Age 12-80 years, 30 pack-year currently smoking OR have quit w/in past 15 years) Hepatitis C Screening recommended: yes HIV Screening recommended: no  Advanced Directives: Written information was prepared per patient's request.  Referrals & Orders No orders of the defined types were placed in this encounter.   Follow-up Plan . Follow-up with Jose [name] as planned   I have personally reviewed and noted the following in the patient's chart:   . Medical and social history . Use of alcohol, tobacco or illicit drugs  . Current medications and supplements . Functional ability and status . Nutritional status . Physical activity . Advanced directives . List of other physicians . Hospitalizations, surgeries, and ER visits in previous 12 months . Vitals . Screenings to include  cognitive, depression, and falls . Referrals and appointments  In addition, I have reviewed and discussed with Jose [name] certain preventive protocols, quality metrics, and best practice recommendations. A written personalized care plan for preventive services as well as general preventive health recommendations is available and can be mailed to the patient at his request.      Wardell Heath, LPN  09/01/2438

## 2019-04-04 NOTE — Patient Instructions (Signed)
Jose [name] , Thank you for taking time to come for your Medicare Wellness Visit. I appreciate your ongoing commitment to your health goals. Please review the following plan we discussed and let me know if I can assist you in the future.   These are the goals we discussed: Goals    . Have 3 meals a day     Eat 3 meals daily that consist of lean proteins, fruits and vegetables Increase Water intake  Limit sugary foods and drinks       This is a list of the screening recommended for you and due dates:  Health Maintenance  Topic Date Due  .  Hepatitis C: One time screening is recommended by Center for Disease Control  (CDC) for  adults born from [birth year] through 1965.   1951/05/08  . Eye exam for diabetics  01/07/1961  . Tetanus Vaccine  01/07/1970  . Colon Cancer Screening  10/03/2012  . Pneumonia vaccines (1 of 2 - PCV13) 01/08/2016  . Urine Protein Check  12/28/2018  . Flu Shot  03/31/2019  . Complete foot exam   04/11/2019  . Hemoglobin A1C  09/15/2019    Advance Directive  Advance directives are legal documents that let you make choices ahead of time about your health care and medical treatment in case you become unable to communicate for yourself. Advance directives are a way for you to communicate your wishes to family, friends, and health care providers. This can help convey your decisions about end-of-life care if you become unable to communicate. Discussing and writing advance directives should happen over time rather than all at once. Advance directives can be changed depending on your situation and what you want, even after you have signed the advance directives. If you do not have an advance directive, some states assign family decision makers to act on your behalf based on how closely you are related to them. Each state has its own laws regarding advance directives. You may want to check with your health care provider, attorney, or state representative about the laws in your  state. There are different types of advance directives, such as:  Medical power of attorney.  Living will.  Do not resuscitate (DNR) or do not attempt resuscitation (DNAR) order. Health care proxy and medical power of attorney A health care proxy, also called a health care agent, is a person who is appointed to make medical decisions for you in cases in which you are unable to make the decisions yourself. Generally, people choose someone they know well and trust to represent their preferences. Make sure to ask this person for an agreement to act as your proxy. A proxy may have to exercise judgment in the event of a medical decision for which your wishes are not known. A medical power of attorney is a legal document that names your health care proxy. Depending on the laws in your state, after the document is written, it may also need to be:  Signed.  Notarized.  Dated.  Copied.  Witnessed.  Incorporated into your medical record. You may also want to appoint someone to manage your financial affairs in a situation in which you are unable to do so. This is called a durable power of attorney for finances. It is a separate legal document from the durable power of attorney for health care. You may choose the same person or someone different from your health care proxy to act as your agent in financial matters. If you do  not appoint a proxy, or if there is a concern that the proxy is not acting in your best interests, a court-appointed guardian may be designated to act on your behalf. Living will A living will is a set of instructions documenting your wishes about medical care when you cannot express them yourself. Health care providers should keep a copy of your living will in your medical record. You may want to give a copy to family members or friends. To alert caregivers in case of an emergency, you can place a card in your wallet to let them know that you have a living will and where they can  find it. A living will is used if you become:  Terminally ill.  Incapacitated.  Unable to communicate or make decisions. Items to consider in your living will include:  The use or non-use of life-sustaining equipment, such as dialysis machines and breathing machines (ventilators).  A DNR or DNAR order, which is the instruction not to use cardiopulmonary resuscitation (CPR) if breathing or heartbeat stops.  The use or non-use of tube feeding.  Withholding of food and fluids.  Comfort (palliative) care when the goal becomes comfort rather than a cure.  Organ and tissue donation. A living will does not give instructions for distributing your money and property if you should pass away. It is recommended that you seek the advice of a lawyer when writing a will. Decisions about taxes, beneficiaries, and asset distribution will be legally binding. This process can relieve your family and friends of any concerns surrounding disputes or questions that may come up about the distribution of your assets. DNR or DNAR A DNR or DNAR order is a request not to have CPR in the event that your heart stops beating or you stop breathing. If a DNR or DNAR order has not been made and shared, a health care provider will try to help any patient whose heart has stopped or who has stopped breathing. If you plan to have surgery, talk with your health care provider about how your DNR or DNAR order will be followed if problems occur. Summary  Advance directives are the legal documents that allow you to make choices ahead of time about your health care and medical treatment in case you become unable to communicate for yourself.  The process of discussing and writing advance directives should happen over time. You can change the advance directives, even after you have signed them.  Advance directives include DNR or DNAR orders, living wills, and designating an agent as your medical power of attorney. This information  is not intended to replace advice given to you by your health care provider. Make sure you discuss any questions you have with your health care provider. Document Released: 11/23/2007 Document Revised: 09/20/2018 Document Reviewed: 07/05/2016 Elsevier Patient Education  [place].   BMI for Adults  Body mass index (BMI) is a number that is calculated from a person's weight and height. BMI may help to estimate how much of a person's weight is composed of fat. BMI can help identify those who may be at higher risk for certain medical problems. How is BMI used with adults? BMI is used as a screening tool to identify possible weight problems. It is used to check whether a person is obese, overweight, healthy weight, or underweight. How is BMI calculated? BMI measures your weight and compares it to your height. This can be done either in [place] ([place] or metric measurements. Note that charts are  available to help you find your BMI quickly and easily without having to do these calculations yourself. To calculate your BMI in English (U.S.) measurements, your health care provider will: 1. Measure your weight in pounds (lb). 2. Multiply the number of pounds by 703. ? For example, for a person who weighs 180 lb, multiply that number by 703, which equals 126,540. 3. Measure your height in inches (in). Then multiply that number by itself to get a measurement called "inches squared." ? For example, for a person who is 70 in tall, the "inches squared" measurement is 70 in x 70 in, which equals 4900 inches squared. 4. Divide the total from Step 2 (number of lb x 703) by the total from Step 3 (inches squared): 126,540  4900 = 25.8. This is your BMI. To calculate your BMI in metric measurements, your health care provider will: 1. Measure your weight in kilograms (kg). 2. Measure your height in meters (m). Then multiply that number by itself to get a measurement called "meters squared." ? For example,  for a person who is 1.75 m tall, the "meters squared" measurement is 1.75 m x 1.75 m, which is equal to 3.1 meters squared. 3. Divide the number of kilograms (your weight) by the meters squared number. In this example: 70  3.1 = 22.6. This is your BMI. How is BMI interpreted? To interpret your results, your health care provider will use BMI charts to identify whether you are underweight, normal weight, overweight, or obese. The following guidelines will be used:  Underweight: BMI less than 18.5.  Normal weight: BMI between 18.5 and 24.9.  Overweight: BMI between 25 and 29.9.  Obese: BMI of 30 and above. Please note:  Weight includes both fat and muscle, so someone with a muscular build, such as an athlete, may have a BMI that is higher than 24.9. In cases like these, BMI is not an accurate measure of body fat.  To determine if excess body fat is the cause of a BMI of 25 or higher, further assessments may need to be done by a health care provider.  BMI is usually interpreted in the same way for men and women. Why is BMI a useful tool? BMI is useful in two ways:  Identifying a weight problem that may be related to a medical condition, or that may increase the risk for medical problems.  Promoting lifestyle and diet changes in order to reach a healthy weight. Summary  Body mass index (BMI) is a number that is calculated from a person's weight and height.  BMI may help to estimate how much of a person's weight is composed of fat. BMI can help identify those who may be at higher risk for certain medical problems.  BMI can be measured using English measurements or metric measurements.  To interpret your results, your health care provider will use BMI charts to identify whether you are underweight, normal weight, overweight, or obese. This information is not intended to replace advice given to you by your health care provider. Make sure you discuss any questions you have with your health  care provider. Document Released: 04/27/2004 Document Revised: 07/29/2017 Document Reviewed: 06/29/2017 Elsevier Patient Education  2020 [employer].

## 2019-04-07 ENCOUNTER — Encounter: Payer: Self-pay | Admitting: Family Medicine

## 2019-04-07 ENCOUNTER — Telehealth (INDEPENDENT_AMBULATORY_CARE_PROVIDER_SITE_OTHER): Payer: Medicare HMO | Admitting: Family Medicine

## 2019-04-07 DIAGNOSIS — J441 Chronic obstructive pulmonary disease with (acute) exacerbation: Secondary | ICD-10-CM | POA: Diagnosis not present

## 2019-04-07 MED ORDER — PREDNISONE 20 MG PO TABS: 40.0000 mg | ORAL_TABLET | Freq: Every day | ORAL | 0 refills | Status: AC

## 2019-04-07 MED ORDER — DOXYCYCLINE HYCLATE 100 MG PO TABS: 100.0000 mg | ORAL_TABLET | Freq: Two times a day (BID) | ORAL | 0 refills | Status: AC

## 2019-04-07 MED ORDER — ALBUTEROL SULFATE HFA 108 (90 BASE) MCG/ACT IN AERS: 2.0000 | INHALATION_SPRAY | Freq: Four times a day (QID) | RESPIRATORY_TRACT | 0 refills | Status: AC | PRN

## 2019-04-07 NOTE — Telephone Encounter (Signed)
**[facility] Family Medicine After Hours/ Emergency Line Call**  Telephone visit  Subjective: XL:KGMWN PCP: Dettinger, Fransisca Kaufmann, MD [id number] Jose [name] is a 68 y.o. male calls for telephone consult today. Patient provides verbal consent for consult held via phone.  Location of patient: home Location of provider: Working remotely from home Others present for call: wife  1. Cough Patient's wife calls to inform that patient has been coughing and desaturating with ambulation.  She notes he is breathing normally if seated and has a pulse ox of 94% at rest.  He destaurates to 70's if ambulating.  Does not have albuterol inhaler at home. She reports h/o COPD and CHF.  Denies any change in weight.  He has stable, chronic LE edema.  She reports she herself is sick with a URI and suspects that he may be getting ill as well.  Denies chest pain, vomiting, diarrhea.  He has home O2 available.   ROS: Per HPI  Allergies  Allergen Reactions  . Amiodarone     Weakness during 2018 admission  . Atorvastatin     Tremors  . Simvastatin     Muscle weakness   Past Medical History:  Diagnosis Date  . Anemia   . Anxiety   . Arthritis    back  . CAD (coronary artery disease)    a. prior silent inferior MI, NSTEMI 07/2014 with chronically occluded RCA s/p unsuccessful PCI. b. STEMI 07/2017 with multivessel disease s/p failed PTCA of distal LAD.  [name] Kitchen Carotid artery disease ([place])   . Cataract   . Chronic combined systolic and diastolic CHF (congestive heart failure) ([place])   . Chronic respiratory failure ([place])    a. Placed on home O2 01/2016.  [name] Kitchen CKD (chronic kidney disease), stage III ([place])   . COPD (chronic obstructive pulmonary disease) ([place])   . Diabetes mellitus ([place])   . Former tobacco use   . GERD (gastroesophageal reflux disease)   . Hyperlipidemia   . Hypertension   . Hyponatremia   . Morbid obesity ([place])   . Myocardial infarction ([place])   . Neuropathy   . PAF (paroxysmal atrial  fibrillation) ([place])    a. dx 06/2017.  [name] Kitchen Peripheral vascular disease ([place])   . Pneumonia   . Sleep apnea   . Stroke ([place]) 06/2017  . TIA (transient ischemic attack) 2017   a. hemispheric L carotid artery syndrome in setting of hypotension.    Current Outpatient Medications:  .  aluminum hydroxide-magnesium carbonate (GAVISCON) 95-358 MG/15ML SUSP, Take 30 mLs by mouth daily as needed for indigestion or heartburn., Disp: , Rfl:  .  apixaban (ELIQUIS) 5 MG TABS tablet, Take 1 tablet (5 mg total) by mouth 2 (two) times daily., Disp: 28 tablet, Rfl: 0 .  Carboxymethylcellul-Glycerin (LUBRICATING EYE DROPS OP), Place 1 drop into both eyes daily as needed (dry eyes)., Disp: , Rfl:  .  clopidogrel (PLAVIX) 75 MG tablet, Take 1 tablet (75 mg total) by mouth daily., Disp: 30 tablet, Rfl: 2 .  Continuous Blood Gluc Receiver (FREESTYLE LIBRE 14 DAY READER) DEVI, 1 each by Does not apply route 4 (four) times daily., Disp: 1 Device, Rfl: 1 .  Continuous Blood Gluc Sensor (FREESTYLE LIBRE 14 DAY SENSOR) MISC, 1 each by Does not apply route every 14 (fourteen) days., Disp: 7 each, Rfl: 3 .  ergocalciferol (VITAMIN D2) 50000 UNITS capsule, Take 50,000 Units by mouth 2 (two) times a week. wednesdays and saturdays, Disp: , Rfl:  .  ezetimibe (ZETIA)  10 MG tablet, Take 1 tablet (10 mg total) by mouth daily., Disp: 30 tablet, Rfl: 11 .  famotidine (PEPCID) 20 MG tablet, Take 1 tablet (20 mg total) by mouth every morning., Disp: , Rfl:  .  ferrous sulfate 325 (65 FE) MG tablet, Take 1 tablet (325 mg total) by mouth daily with breakfast., Disp: , Rfl: 3 .  furosemide (LASIX) 40 MG tablet, Take 40-60 mg by mouth See admin instructions. Takes 40mg  twice daily. For fluid increase, take additional one-half tablet in the morning as needed for swelling, Disp: , Rfl:  .  insulin aspart (NOVOLOG) 100 UNIT/ML injection, Inject 10 Units into the skin 3 (three) times daily before meals. If you eat 50% or more of meal (Patient  taking differently: Inject 30 Units into the skin 3 (three) times daily before meals. If you eat 50% or more of meal), Disp: 10 mL, Rfl: 11 .  insulin glargine (LANTUS) 100 UNIT/ML injection, 35 units in the AM and 35 units in the PM (Patient taking differently: Inject 55-60 Units into the skin See admin instructions. 55 units in the AM and 60 units in the PM), Disp: 10 mL, Rfl: 11 .  isosorbide mononitrate (IMDUR) 30 MG 24 hr tablet, Take 1 tablet (30 mg total) by mouth daily., Disp: 90 tablet, Rfl: 3 .  levothyroxine (SYNTHROID) 50 MCG tablet, TAKE 1 TABLET BY MOUTH ONCE DAILY BEFORE BREAKFAST (Patient taking differently: Take 50 mcg by mouth daily before breakfast. ), Disp: 90 tablet, Rfl: 3 .  magnesium oxide (MAG-OX) 400 MG tablet, Take 1 tablet (400 mg total) by mouth daily., Disp: 34 tablet, Rfl: 3 .  metFORMIN (GLUCOPHAGE) 1000 MG tablet, Take 1 tablet (1,000 mg total) by mouth 2 (two) times daily with a meal., Disp: 60 tablet, Rfl: 3 .  metoprolol succinate (TOPROL-XL) 25 MG 24 hr tablet, Take 1 tablet (25 mg total) by mouth daily., Disp: 90 tablet, Rfl: 3 .  Multiple Vitamins-Minerals (ICAPS AREDS 2) CAPS, Take 1 capsule by mouth 2 (two) times daily., Disp: , Rfl:  .  nitroGLYCERIN (NITROSTAT) 0.4 MG SL tablet, Place 1 tablet (0.4 mg total) under the tongue every 5 (five) minutes x 3 doses as needed for chest pain., Disp: 25 tablet, Rfl: 12 .  Omega-3 Fatty Acids (FISH OIL) 1000 MG CAPS, Take 2,000-3,000 mg by mouth See admin instructions. Take 3000 mg in the morning, 2000 mg at noon, and 3000 mg in the evening, Disp: , Rfl:  .  OVER THE COUNTER MEDICATION, Apply 1 application topically daily as needed (wound care). Medihoney otc wound care gel, Disp: , Rfl:  .  oxyCODONE (OXY IR/ROXICODONE) 5 MG immediate release tablet, Take 5-10 mg by mouth every 6 (six) hours as needed for severe pain., Disp: , Rfl:  .  sertraline (ZOLOFT) 100 MG tablet, Take 100 mg by mouth daily., Disp: , Rfl:  .   tiotropium (SPIRIVA) 18 MCG inhalation capsule, Place 18 mcg into inhaler and inhale at bedtime. , Disp: , Rfl:  .  vitamin B-12 (CYANOCOBALAMIN) 1000 MCG tablet, Take 1,000 mcg by mouth daily., Disp: , Rfl:   Assessment/ Plan: 68 y.o. male   1. COPD exacerbation ([place]) Will treat as COPD exacerbation given stability of weight and lack of increased fluid.  We discussed the limitations of phone visits.  I actually recommended evaluation at Urgent Care but she wishes to try something over the phone first.  Recommended that she seek immediate medical attention if symptoms worsen or  he develops any other worrisome symptoms.  She voiced good understanding.  Red flags discussed.  Will forward to PCP.  Meds ordered this encounter  Medications  . doxycycline (VIBRA-TABS) 100 MG tablet    Sig: Take 1 tablet (100 mg total) by mouth 2 (two) times daily.    Dispense:  20 tablet    Refill:  0  . albuterol (VENTOLIN HFA) 108 (90 Base) MCG/ACT inhaler    Sig: Inhale 2 puffs into the lungs every 6 (six) hours as needed for wheezing or shortness of breath.    Dispense:  18 g    Refill:  0  . predniSONE (DELTASONE) 20 MG tablet    Sig: Take 2 tablets (40 mg total) by mouth daily with breakfast for 5 days.    Dispense:  10 tablet    Refill:  0   Start time: 10:40am End time: 10:46am  Total time spent on patient care (including telephone call/ virtual visit): 15 minutes  [place], [place] [phone]    Koleen Distance. Lajuana Ripple, DO

## 2019-04-10 ENCOUNTER — Ambulatory Visit (INDEPENDENT_AMBULATORY_CARE_PROVIDER_SITE_OTHER): Payer: Medicare HMO | Admitting: Family Medicine

## 2019-04-10 ENCOUNTER — Telehealth: Payer: Self-pay | Admitting: Family Medicine

## 2019-04-10 ENCOUNTER — Other Ambulatory Visit: Payer: Self-pay

## 2019-04-10 ENCOUNTER — Encounter: Payer: Self-pay | Admitting: Family Medicine

## 2019-04-10 DIAGNOSIS — R69 Illness, unspecified: Secondary | ICD-10-CM | POA: Diagnosis not present

## 2019-04-10 DIAGNOSIS — J438 Other emphysema: Secondary | ICD-10-CM | POA: Diagnosis not present

## 2019-04-10 MED ORDER — NEBULIZER DEVI: 0 refills | Status: AC

## 2019-04-10 MED ORDER — IPRATROPIUM-ALBUTEROL 0.5-2.5 (3) MG/3ML IN SOLN: 3.0000 mL | Freq: Four times a day (QID) | RESPIRATORY_TRACT | 11 refills | Status: AC

## 2019-04-10 NOTE — Telephone Encounter (Signed)
Wife states that patient spoke with on call provider on Saturday.  Wife states that patient has a non productive cough. Patient was given a steroid, antibiotic and taking mucinex.  Patient is unable to get mucus out of chest.  Elevated BS [phone] needs to increase Novolog while on prednisone?

## 2019-04-10 NOTE — Telephone Encounter (Signed)
Please schedule patient a telephone visit for today.

## 2019-04-10 NOTE — Progress Notes (Signed)
Subjective:    Patient ID: Jose [name], male    DOB: 02/06/1951, 68 y.o.   MRN: [id number]   HPI: Jose [name] is a 68 y.o. male presenting for Symptoms include congestion, facial pain, nasal congestion, non productive cough, post nasal drip and sinus pressure. There is no fever, chills, or sweats. Onset of symptoms was a few days ago, gradually worsening since that time.  Taking prednisone and doxycycline. Lots of phlegm in lungs. Wants neb  History given by wife, primary caregiver   Depression screen [facility] 2/9 04/04/2019 02/20/2019 09/20/2018 09/15/2018 07/11/2018  Decreased Interest 0 0 0 0 0  Down, Depressed, Hopeless 0 0 0 0 0  PHQ - 2 Score 0 0 0 0 0     Relevant past medical, surgical, family and social history reviewed and updated as indicated.  Interim medical history since our last visit reviewed. Allergies and medications reviewed and updated.  ROS:  Review of Systems  Constitutional: Negative for chills, diaphoresis and fever.  Respiratory: Positive for cough, choking and shortness of breath.      Social History   Tobacco Use  Smoking Status Former Smoker  . Packs/day: 3.00  . Years: 45.00  . Pack years: 135.00  . Types: Cigarettes  . Quit date: 08/31/2007  . Years since quitting: 11.6  Smokeless Tobacco Never Used       Objective:     Wt Readings from Last 3 Encounters:  04/04/19 297 lb 13.5 oz (135.1 kg)  03/20/19 297 lb 12.8 oz (135.1 kg)  03/15/19 291 lb (132 kg)     Exam deferred. Pt. Harboring due to COVID 19. Phone visit performed.   Assessment & Plan:   1. Other emphysema ([place])     Meds ordered this encounter  Medications  . Respiratory Therapy Supplies (NEBULIZER) DEVI    Sig: Use with nebs QID for COPD    Dispense:  1 Device    Refill:  0  . ipratropium-albuterol (DUONEB) 0.5-2.5 (3) MG/3ML SOLN    Sig: Take 3 mLs by nebulization 4 (four) times daily.    Dispense:  360 mL    Refill:  11    No orders of the defined types were  placed in this encounter.     Diagnoses and all orders for this visit:  Other emphysema ([place])  Other orders -     Respiratory Therapy Supplies (NEBULIZER) DEVI; Use with nebs QID for COPD -     ipratropium-albuterol (DUONEB) 0.5-2.5 (3) MG/3ML SOLN; Take 3 mLs by nebulization 4 (four) times daily.    Virtual Visit via telephone Note  I discussed the limitations, risks, security and privacy concerns of performing an evaluation and management service by telephone and the availability of in person appointments. The patient was identified with two identifiers. Pt.expressed understanding and agreed to proceed. Pt. Is at home. Dr. Livia Snellen is in his office.  Follow Up Instructions:   I discussed the assessment and treatment plan with the patient. The patient was provided an opportunity to ask questions and all were answered. The patient agreed with the plan and demonstrated an understanding of the instructions.   The patient was advised to call back or seek an in-person evaluation if the symptoms worsen or if the condition fails to improve as anticipated.   Total minutes including chart review and phone contact time: 14   Follow up plan: Return if symptoms worsen or fail to improve.  Claretta Fraise, MD [facility]

## 2019-04-10 NOTE — Telephone Encounter (Signed)
Televisit appt made. Wife aware

## 2019-04-12 ENCOUNTER — Telehealth: Payer: Self-pay | Admitting: Family Medicine

## 2019-04-12 NOTE — Telephone Encounter (Signed)
Wife aware

## 2019-04-12 NOTE — Telephone Encounter (Signed)
He could take Tylenol sinus and cold, there are no interactions with his current medication, he could also take Mucinex which could help as well.  Robitussin is also a good over-the-counter cough medicine

## 2019-04-13 ENCOUNTER — Telehealth: Payer: Self-pay | Admitting: Family Medicine

## 2019-04-13 NOTE — Telephone Encounter (Signed)
Unfortunately except washing hands and isolating as much as you can and wearing mask around him as much as you can, call if any symptoms arrise

## 2019-04-13 NOTE — Telephone Encounter (Signed)
Wife aware

## 2019-04-14 ENCOUNTER — Inpatient Hospital Stay (HOSPITAL_COMMUNITY)
Admission: AD | Admit: 2019-04-14 | Payer: No Typology Code available for payment source | Source: Other Acute Inpatient Hospital | Admitting: Cardiology

## 2019-04-14 DIAGNOSIS — R0902 Hypoxemia: Secondary | ICD-10-CM | POA: Diagnosis not present

## 2019-04-14 DIAGNOSIS — J810 Acute pulmonary edema: Secondary | ICD-10-CM | POA: Diagnosis not present

## 2019-04-14 DIAGNOSIS — R918 Other nonspecific abnormal finding of lung field: Secondary | ICD-10-CM | POA: Diagnosis not present

## 2019-04-14 DIAGNOSIS — R0602 Shortness of breath: Secondary | ICD-10-CM | POA: Diagnosis not present

## 2019-04-14 DIAGNOSIS — E7800 Pure hypercholesterolemia, unspecified: Secondary | ICD-10-CM | POA: Diagnosis not present

## 2019-04-14 DIAGNOSIS — R069 Unspecified abnormalities of breathing: Secondary | ICD-10-CM | POA: Diagnosis not present

## 2019-04-14 DIAGNOSIS — I739 Peripheral vascular disease, unspecified: Secondary | ICD-10-CM | POA: Diagnosis not present

## 2019-04-14 DIAGNOSIS — J90 Pleural effusion, not elsewhere classified: Secondary | ICD-10-CM | POA: Diagnosis not present

## 2019-04-14 DIAGNOSIS — E1140 Type 2 diabetes mellitus with diabetic neuropathy, unspecified: Secondary | ICD-10-CM | POA: Diagnosis not present

## 2019-04-14 DIAGNOSIS — I509 Heart failure, unspecified: Secondary | ICD-10-CM | POA: Diagnosis not present

## 2019-04-14 DIAGNOSIS — Z794 Long term (current) use of insulin: Secondary | ICD-10-CM | POA: Diagnosis not present

## 2019-04-14 DIAGNOSIS — Z9989 Dependence on other enabling machines and devices: Secondary | ICD-10-CM | POA: Diagnosis not present

## 2019-04-14 DIAGNOSIS — K219 Gastro-esophageal reflux disease without esophagitis: Secondary | ICD-10-CM | POA: Diagnosis not present

## 2019-04-14 DIAGNOSIS — Z7901 Long term (current) use of anticoagulants: Secondary | ICD-10-CM | POA: Diagnosis not present

## 2019-04-14 DIAGNOSIS — Z87891 Personal history of nicotine dependence: Secondary | ICD-10-CM | POA: Diagnosis not present

## 2019-04-14 DIAGNOSIS — Z79899 Other long term (current) drug therapy: Secondary | ICD-10-CM | POA: Diagnosis not present

## 2019-04-14 DIAGNOSIS — I110 Hypertensive heart disease with heart failure: Secondary | ICD-10-CM | POA: Diagnosis not present

## 2019-04-14 DIAGNOSIS — R000 Tachycardia, unspecified: Secondary | ICD-10-CM | POA: Diagnosis not present

## 2019-04-14 DIAGNOSIS — Z209 Contact with and (suspected) exposure to unspecified communicable disease: Secondary | ICD-10-CM | POA: Diagnosis not present

## 2019-04-15 DIAGNOSIS — I4891 Unspecified atrial fibrillation: Secondary | ICD-10-CM | POA: Diagnosis not present

## 2019-04-15 DIAGNOSIS — Z20828 Contact with and (suspected) exposure to other viral communicable diseases: Secondary | ICD-10-CM | POA: Diagnosis not present

## 2019-04-15 DIAGNOSIS — I110 Hypertensive heart disease with heart failure: Secondary | ICD-10-CM | POA: Diagnosis not present

## 2019-04-15 DIAGNOSIS — I161 Hypertensive emergency: Secondary | ICD-10-CM | POA: Diagnosis not present

## 2019-04-15 DIAGNOSIS — I214 Non-ST elevation (NSTEMI) myocardial infarction: Secondary | ICD-10-CM | POA: Diagnosis not present

## 2019-04-15 DIAGNOSIS — U071 COVID-19: Secondary | ICD-10-CM | POA: Diagnosis not present

## 2019-04-15 DIAGNOSIS — I509 Heart failure, unspecified: Secondary | ICD-10-CM | POA: Diagnosis not present

## 2019-04-16 DIAGNOSIS — E8770 Fluid overload, unspecified: Secondary | ICD-10-CM | POA: Diagnosis not present

## 2019-04-16 DIAGNOSIS — J1289 Other viral pneumonia: Secondary | ICD-10-CM | POA: Diagnosis not present

## 2019-04-16 DIAGNOSIS — I161 Hypertensive emergency: Secondary | ICD-10-CM | POA: Diagnosis not present

## 2019-04-16 DIAGNOSIS — I5021 Acute systolic (congestive) heart failure: Secondary | ICD-10-CM | POA: Diagnosis not present

## 2019-04-16 DIAGNOSIS — J9601 Acute respiratory failure with hypoxia: Secondary | ICD-10-CM | POA: Diagnosis not present

## 2019-04-16 DIAGNOSIS — J441 Chronic obstructive pulmonary disease with (acute) exacerbation: Secondary | ICD-10-CM | POA: Diagnosis not present

## 2019-04-16 DIAGNOSIS — I4891 Unspecified atrial fibrillation: Secondary | ICD-10-CM | POA: Diagnosis not present

## 2019-04-16 DIAGNOSIS — I5023 Acute on chronic systolic (congestive) heart failure: Secondary | ICD-10-CM | POA: Diagnosis not present

## 2019-04-16 DIAGNOSIS — U071 COVID-19: Secondary | ICD-10-CM | POA: Diagnosis not present

## 2019-04-16 DIAGNOSIS — Z9989 Dependence on other enabling machines and devices: Secondary | ICD-10-CM | POA: Diagnosis not present

## 2019-04-16 DIAGNOSIS — I110 Hypertensive heart disease with heart failure: Secondary | ICD-10-CM | POA: Diagnosis not present

## 2019-04-16 DIAGNOSIS — N179 Acute kidney failure, unspecified: Secondary | ICD-10-CM | POA: Diagnosis not present

## 2019-04-16 DIAGNOSIS — J9691 Respiratory failure, unspecified with hypoxia: Secondary | ICD-10-CM | POA: Diagnosis not present

## 2019-04-16 DIAGNOSIS — E1165 Type 2 diabetes mellitus with hyperglycemia: Secondary | ICD-10-CM | POA: Diagnosis not present

## 2019-04-16 DIAGNOSIS — I160 Hypertensive urgency: Secondary | ICD-10-CM | POA: Diagnosis not present

## 2019-04-17 DIAGNOSIS — I160 Hypertensive urgency: Secondary | ICD-10-CM | POA: Diagnosis not present

## 2019-04-17 DIAGNOSIS — U071 COVID-19: Secondary | ICD-10-CM | POA: Diagnosis not present

## 2019-04-17 DIAGNOSIS — J1289 Other viral pneumonia: Secondary | ICD-10-CM | POA: Diagnosis not present

## 2019-04-17 DIAGNOSIS — J9601 Acute respiratory failure with hypoxia: Secondary | ICD-10-CM | POA: Diagnosis not present

## 2019-04-18 DIAGNOSIS — E1165 Type 2 diabetes mellitus with hyperglycemia: Secondary | ICD-10-CM | POA: Diagnosis not present

## 2019-04-18 DIAGNOSIS — I161 Hypertensive emergency: Secondary | ICD-10-CM | POA: Diagnosis not present

## 2019-04-18 DIAGNOSIS — I120 Hypertensive chronic kidney disease with stage 5 chronic kidney disease or end stage renal disease: Secondary | ICD-10-CM | POA: Diagnosis not present

## 2019-04-18 DIAGNOSIS — N189 Chronic kidney disease, unspecified: Secondary | ICD-10-CM | POA: Diagnosis not present

## 2019-04-18 DIAGNOSIS — U071 COVID-19: Secondary | ICD-10-CM | POA: Diagnosis not present

## 2019-04-18 DIAGNOSIS — J9691 Respiratory failure, unspecified with hypoxia: Secondary | ICD-10-CM | POA: Diagnosis not present

## 2019-04-18 DIAGNOSIS — Z452 Encounter for adjustment and management of vascular access device: Secondary | ICD-10-CM | POA: Diagnosis not present

## 2019-04-18 DIAGNOSIS — J9601 Acute respiratory failure with hypoxia: Secondary | ICD-10-CM | POA: Diagnosis not present

## 2019-04-18 DIAGNOSIS — I110 Hypertensive heart disease with heart failure: Secondary | ICD-10-CM | POA: Diagnosis not present

## 2019-04-18 DIAGNOSIS — J1289 Other viral pneumonia: Secondary | ICD-10-CM | POA: Diagnosis not present

## 2019-04-18 DIAGNOSIS — Z9989 Dependence on other enabling machines and devices: Secondary | ICD-10-CM | POA: Diagnosis not present

## 2019-04-18 DIAGNOSIS — I21A1 Myocardial infarction type 2: Secondary | ICD-10-CM | POA: Diagnosis not present

## 2019-04-18 DIAGNOSIS — I160 Hypertensive urgency: Secondary | ICD-10-CM | POA: Diagnosis not present

## 2019-04-18 DIAGNOSIS — Z794 Long term (current) use of insulin: Secondary | ICD-10-CM | POA: Diagnosis not present

## 2019-04-18 DIAGNOSIS — N179 Acute kidney failure, unspecified: Secondary | ICD-10-CM | POA: Diagnosis not present

## 2019-04-18 DIAGNOSIS — E1122 Type 2 diabetes mellitus with diabetic chronic kidney disease: Secondary | ICD-10-CM | POA: Diagnosis not present

## 2019-04-18 DIAGNOSIS — I5023 Acute on chronic systolic (congestive) heart failure: Secondary | ICD-10-CM | POA: Diagnosis not present

## 2019-04-19 ENCOUNTER — Telehealth: Payer: Self-pay | Admitting: *Deleted

## 2019-04-19 DIAGNOSIS — N189 Chronic kidney disease, unspecified: Secondary | ICD-10-CM | POA: Diagnosis not present

## 2019-04-19 DIAGNOSIS — I129 Hypertensive chronic kidney disease with stage 1 through stage 4 chronic kidney disease, or unspecified chronic kidney disease: Secondary | ICD-10-CM | POA: Diagnosis not present

## 2019-04-19 DIAGNOSIS — J9601 Acute respiratory failure with hypoxia: Secondary | ICD-10-CM | POA: Diagnosis not present

## 2019-04-19 DIAGNOSIS — U071 COVID-19: Secondary | ICD-10-CM | POA: Diagnosis not present

## 2019-04-19 DIAGNOSIS — E1165 Type 2 diabetes mellitus with hyperglycemia: Secondary | ICD-10-CM | POA: Diagnosis not present

## 2019-04-19 DIAGNOSIS — J9691 Respiratory failure, unspecified with hypoxia: Secondary | ICD-10-CM | POA: Diagnosis not present

## 2019-04-19 DIAGNOSIS — J1289 Other viral pneumonia: Secondary | ICD-10-CM | POA: Diagnosis not present

## 2019-04-19 DIAGNOSIS — I161 Hypertensive emergency: Secondary | ICD-10-CM | POA: Diagnosis not present

## 2019-04-19 DIAGNOSIS — I110 Hypertensive heart disease with heart failure: Secondary | ICD-10-CM | POA: Diagnosis not present

## 2019-04-19 DIAGNOSIS — R34 Anuria and oliguria: Secondary | ICD-10-CM | POA: Diagnosis not present

## 2019-04-19 DIAGNOSIS — N179 Acute kidney failure, unspecified: Secondary | ICD-10-CM | POA: Diagnosis not present

## 2019-04-19 DIAGNOSIS — E1122 Type 2 diabetes mellitus with diabetic chronic kidney disease: Secondary | ICD-10-CM | POA: Diagnosis not present

## 2019-04-19 DIAGNOSIS — I5021 Acute systolic (congestive) heart failure: Secondary | ICD-10-CM | POA: Diagnosis not present

## 2019-04-19 DIAGNOSIS — J441 Chronic obstructive pulmonary disease with (acute) exacerbation: Secondary | ICD-10-CM | POA: Diagnosis not present

## 2019-04-19 DIAGNOSIS — I5023 Acute on chronic systolic (congestive) heart failure: Secondary | ICD-10-CM | POA: Diagnosis not present

## 2019-04-19 NOTE — Telephone Encounter (Signed)
Call from patient's wife. Appointment with Dr. Harl Bowie for cardiac clearance cancelled due to patient in Intensive Care at [facility] with heart and kidney issues. She states she will discuss PVD and lower extremity ulcers with physicians at [facility].

## 2019-04-20 DIAGNOSIS — U071 COVID-19: Secondary | ICD-10-CM | POA: Diagnosis not present

## 2019-04-20 DIAGNOSIS — N179 Acute kidney failure, unspecified: Secondary | ICD-10-CM | POA: Diagnosis not present

## 2019-04-20 DIAGNOSIS — E8770 Fluid overload, unspecified: Secondary | ICD-10-CM | POA: Diagnosis not present

## 2019-04-20 DIAGNOSIS — J9601 Acute respiratory failure with hypoxia: Secondary | ICD-10-CM | POA: Diagnosis not present

## 2019-04-20 DIAGNOSIS — J1289 Other viral pneumonia: Secondary | ICD-10-CM | POA: Diagnosis not present

## 2019-04-21 DIAGNOSIS — J1289 Other viral pneumonia: Secondary | ICD-10-CM | POA: Diagnosis not present

## 2019-04-21 DIAGNOSIS — J9621 Acute and chronic respiratory failure with hypoxia: Secondary | ICD-10-CM | POA: Diagnosis not present

## 2019-04-21 DIAGNOSIS — J441 Chronic obstructive pulmonary disease with (acute) exacerbation: Secondary | ICD-10-CM | POA: Diagnosis not present

## 2019-04-21 DIAGNOSIS — N179 Acute kidney failure, unspecified: Secondary | ICD-10-CM | POA: Diagnosis not present

## 2019-04-21 DIAGNOSIS — U071 COVID-19: Secondary | ICD-10-CM | POA: Diagnosis not present

## 2019-04-22 DIAGNOSIS — I509 Heart failure, unspecified: Secondary | ICD-10-CM | POA: Diagnosis not present

## 2019-04-22 DIAGNOSIS — J9621 Acute and chronic respiratory failure with hypoxia: Secondary | ICD-10-CM | POA: Diagnosis not present

## 2019-04-22 DIAGNOSIS — N179 Acute kidney failure, unspecified: Secondary | ICD-10-CM | POA: Diagnosis not present

## 2019-04-22 DIAGNOSIS — I161 Hypertensive emergency: Secondary | ICD-10-CM | POA: Diagnosis not present

## 2019-04-22 DIAGNOSIS — I482 Chronic atrial fibrillation, unspecified: Secondary | ICD-10-CM | POA: Diagnosis not present

## 2019-04-22 DIAGNOSIS — I110 Hypertensive heart disease with heart failure: Secondary | ICD-10-CM | POA: Diagnosis not present

## 2019-04-22 DIAGNOSIS — J441 Chronic obstructive pulmonary disease with (acute) exacerbation: Secondary | ICD-10-CM | POA: Diagnosis not present

## 2019-04-22 DIAGNOSIS — E1165 Type 2 diabetes mellitus with hyperglycemia: Secondary | ICD-10-CM | POA: Diagnosis not present

## 2019-04-22 DIAGNOSIS — U071 COVID-19: Secondary | ICD-10-CM | POA: Diagnosis not present

## 2019-04-22 DIAGNOSIS — J1289 Other viral pneumonia: Secondary | ICD-10-CM | POA: Diagnosis not present

## 2019-04-23 DIAGNOSIS — J810 Acute pulmonary edema: Secondary | ICD-10-CM | POA: Diagnosis not present

## 2019-04-23 DIAGNOSIS — K922 Gastrointestinal hemorrhage, unspecified: Secondary | ICD-10-CM | POA: Diagnosis not present

## 2019-04-23 DIAGNOSIS — J441 Chronic obstructive pulmonary disease with (acute) exacerbation: Secondary | ICD-10-CM | POA: Diagnosis not present

## 2019-04-23 DIAGNOSIS — E119 Type 2 diabetes mellitus without complications: Secondary | ICD-10-CM | POA: Diagnosis not present

## 2019-04-23 DIAGNOSIS — J9621 Acute and chronic respiratory failure with hypoxia: Secondary | ICD-10-CM | POA: Diagnosis not present

## 2019-04-23 DIAGNOSIS — U071 COVID-19: Secondary | ICD-10-CM | POA: Diagnosis not present

## 2019-04-23 DIAGNOSIS — J449 Chronic obstructive pulmonary disease, unspecified: Secondary | ICD-10-CM | POA: Diagnosis not present

## 2019-04-23 DIAGNOSIS — J1289 Other viral pneumonia: Secondary | ICD-10-CM | POA: Diagnosis not present

## 2019-04-23 DIAGNOSIS — I161 Hypertensive emergency: Secondary | ICD-10-CM | POA: Diagnosis not present

## 2019-04-23 DIAGNOSIS — N179 Acute kidney failure, unspecified: Secondary | ICD-10-CM | POA: Diagnosis not present

## 2019-04-23 DIAGNOSIS — I4891 Unspecified atrial fibrillation: Secondary | ICD-10-CM | POA: Diagnosis not present

## 2019-04-24 ENCOUNTER — Ambulatory Visit: Payer: Medicare HMO | Admitting: Student

## 2019-04-24 DIAGNOSIS — I509 Heart failure, unspecified: Secondary | ICD-10-CM | POA: Diagnosis not present

## 2019-04-24 DIAGNOSIS — I4891 Unspecified atrial fibrillation: Secondary | ICD-10-CM | POA: Diagnosis not present

## 2019-04-24 DIAGNOSIS — J441 Chronic obstructive pulmonary disease with (acute) exacerbation: Secondary | ICD-10-CM | POA: Diagnosis not present

## 2019-04-24 DIAGNOSIS — J9621 Acute and chronic respiratory failure with hypoxia: Secondary | ICD-10-CM | POA: Diagnosis not present

## 2019-04-24 DIAGNOSIS — I110 Hypertensive heart disease with heart failure: Secondary | ICD-10-CM | POA: Diagnosis not present

## 2019-04-24 DIAGNOSIS — U071 COVID-19: Secondary | ICD-10-CM | POA: Diagnosis not present

## 2019-04-24 DIAGNOSIS — J1289 Other viral pneumonia: Secondary | ICD-10-CM | POA: Diagnosis not present

## 2019-04-24 DIAGNOSIS — I161 Hypertensive emergency: Secondary | ICD-10-CM | POA: Diagnosis not present

## 2019-04-24 DIAGNOSIS — E1122 Type 2 diabetes mellitus with diabetic chronic kidney disease: Secondary | ICD-10-CM | POA: Diagnosis not present

## 2019-04-24 DIAGNOSIS — N183 Chronic kidney disease, stage 3 (moderate): Secondary | ICD-10-CM | POA: Diagnosis not present

## 2019-04-24 DIAGNOSIS — N179 Acute kidney failure, unspecified: Secondary | ICD-10-CM | POA: Diagnosis not present

## 2019-04-24 DIAGNOSIS — I129 Hypertensive chronic kidney disease with stage 1 through stage 4 chronic kidney disease, or unspecified chronic kidney disease: Secondary | ICD-10-CM | POA: Diagnosis not present

## 2019-04-24 DIAGNOSIS — J810 Acute pulmonary edema: Secondary | ICD-10-CM | POA: Diagnosis not present

## 2019-04-25 DIAGNOSIS — J9601 Acute respiratory failure with hypoxia: Secondary | ICD-10-CM | POA: Diagnosis not present

## 2019-04-25 DIAGNOSIS — I959 Hypotension, unspecified: Secondary | ICD-10-CM | POA: Diagnosis not present

## 2019-04-25 DIAGNOSIS — I129 Hypertensive chronic kidney disease with stage 1 through stage 4 chronic kidney disease, or unspecified chronic kidney disease: Secondary | ICD-10-CM | POA: Diagnosis not present

## 2019-04-25 DIAGNOSIS — U071 COVID-19: Secondary | ICD-10-CM | POA: Diagnosis not present

## 2019-04-25 DIAGNOSIS — J9602 Acute respiratory failure with hypercapnia: Secondary | ICD-10-CM | POA: Diagnosis not present

## 2019-04-25 DIAGNOSIS — N183 Chronic kidney disease, stage 3 (moderate): Secondary | ICD-10-CM | POA: Diagnosis not present

## 2019-04-25 DIAGNOSIS — I110 Hypertensive heart disease with heart failure: Secondary | ICD-10-CM | POA: Diagnosis not present

## 2019-04-25 DIAGNOSIS — E1122 Type 2 diabetes mellitus with diabetic chronic kidney disease: Secondary | ICD-10-CM | POA: Diagnosis not present

## 2019-04-25 DIAGNOSIS — J441 Chronic obstructive pulmonary disease with (acute) exacerbation: Secondary | ICD-10-CM | POA: Diagnosis not present

## 2019-04-25 DIAGNOSIS — J1289 Other viral pneumonia: Secondary | ICD-10-CM | POA: Diagnosis not present

## 2019-04-25 DIAGNOSIS — I4891 Unspecified atrial fibrillation: Secondary | ICD-10-CM | POA: Diagnosis not present

## 2019-04-25 DIAGNOSIS — I5023 Acute on chronic systolic (congestive) heart failure: Secondary | ICD-10-CM | POA: Diagnosis not present

## 2019-04-25 DIAGNOSIS — J9621 Acute and chronic respiratory failure with hypoxia: Secondary | ICD-10-CM | POA: Diagnosis not present

## 2019-04-25 DIAGNOSIS — N179 Acute kidney failure, unspecified: Secondary | ICD-10-CM | POA: Diagnosis not present

## 2019-04-26 DIAGNOSIS — J9602 Acute respiratory failure with hypercapnia: Secondary | ICD-10-CM | POA: Diagnosis not present

## 2019-04-26 DIAGNOSIS — E872 Acidosis: Secondary | ICD-10-CM | POA: Diagnosis not present

## 2019-04-26 DIAGNOSIS — J9601 Acute respiratory failure with hypoxia: Secondary | ICD-10-CM | POA: Diagnosis not present

## 2019-04-26 DIAGNOSIS — I5023 Acute on chronic systolic (congestive) heart failure: Secondary | ICD-10-CM | POA: Diagnosis not present

## 2019-04-26 DIAGNOSIS — I110 Hypertensive heart disease with heart failure: Secondary | ICD-10-CM | POA: Diagnosis not present

## 2019-04-26 DIAGNOSIS — J1289 Other viral pneumonia: Secondary | ICD-10-CM | POA: Diagnosis not present

## 2019-04-26 DIAGNOSIS — A419 Sepsis, unspecified organism: Secondary | ICD-10-CM | POA: Diagnosis not present

## 2019-04-26 DIAGNOSIS — J441 Chronic obstructive pulmonary disease with (acute) exacerbation: Secondary | ICD-10-CM | POA: Diagnosis not present

## 2019-04-26 DIAGNOSIS — U071 COVID-19: Secondary | ICD-10-CM | POA: Diagnosis not present

## 2019-04-26 DIAGNOSIS — R6521 Severe sepsis with septic shock: Secondary | ICD-10-CM | POA: Diagnosis not present

## 2019-05-01 DEATH — deceased

## 2019-05-02 ENCOUNTER — Ambulatory Visit: Payer: Medicare HMO | Admitting: Family Medicine

## 2020-04-04 ENCOUNTER — Ambulatory Visit: Payer: Medicare HMO
